# Patient Record
Sex: Female | Born: 1960
Health system: Southern US, Community
[De-identification: ages and names within clinical notes are randomized; demographics above are authoritative.]

## PROBLEM LIST (undated history)

## (undated) DIAGNOSIS — R51 Headache: Secondary | ICD-10-CM

## (undated) DIAGNOSIS — R519 Headache, unspecified: Secondary | ICD-10-CM

## (undated) DIAGNOSIS — E785 Hyperlipidemia, unspecified: Secondary | ICD-10-CM

## (undated) DIAGNOSIS — K219 Gastro-esophageal reflux disease without esophagitis: Secondary | ICD-10-CM

## (undated) DIAGNOSIS — IMO0001 Reserved for inherently not codable concepts without codable children: Secondary | ICD-10-CM

## (undated) DIAGNOSIS — J309 Allergic rhinitis, unspecified: Secondary | ICD-10-CM

## (undated) DIAGNOSIS — I10 Essential (primary) hypertension: Secondary | ICD-10-CM

## (undated) DIAGNOSIS — N943 Premenstrual tension syndrome: Secondary | ICD-10-CM

## (undated) DIAGNOSIS — K589 Irritable bowel syndrome without diarrhea: Secondary | ICD-10-CM

## (undated) DIAGNOSIS — E119 Type 2 diabetes mellitus without complications: Secondary | ICD-10-CM

## (undated) DIAGNOSIS — G8929 Other chronic pain: Secondary | ICD-10-CM

## (undated) DIAGNOSIS — F329 Major depressive disorder, single episode, unspecified: Secondary | ICD-10-CM

## (undated) DIAGNOSIS — F32A Depression, unspecified: Secondary | ICD-10-CM

## (undated) DIAGNOSIS — F419 Anxiety disorder, unspecified: Secondary | ICD-10-CM

## (undated) HISTORY — DX: Type 2 diabetes mellitus without complications: E11.9

## (undated) HISTORY — PX: LUMBAR DISC SURGERY: SHX700

## (undated) HISTORY — DX: Gastro-esophageal reflux disease without esophagitis: K21.9

## (undated) HISTORY — DX: Allergic rhinitis, unspecified: J30.9

## (undated) HISTORY — DX: Irritable bowel syndrome, unspecified: K58.9

## (undated) HISTORY — DX: Hyperlipidemia, unspecified: E78.5

## (undated) HISTORY — DX: Reserved for inherently not codable concepts without codable children: IMO0001

## (undated) HISTORY — DX: Depression, unspecified: F32.A

## (undated) HISTORY — DX: Other chronic pain: G89.29

## (undated) HISTORY — DX: Premenstrual tension syndrome: N94.3

## (undated) HISTORY — DX: Major depressive disorder, single episode, unspecified: F32.9

---

## 2003-07-16 ENCOUNTER — Ambulatory Visit (HOSPITAL_COMMUNITY): Admission: RE | Admit: 2003-07-16 | Discharge: 2003-07-16 | Payer: Self-pay | Admitting: Gastroenterology

## 2003-07-24 ENCOUNTER — Encounter: Admission: RE | Admit: 2003-07-24 | Discharge: 2003-07-24 | Payer: Self-pay | Admitting: Gastroenterology

## 2003-07-25 ENCOUNTER — Encounter: Admission: RE | Admit: 2003-07-25 | Discharge: 2003-07-25 | Payer: Self-pay | Admitting: Gastroenterology

## 2004-12-22 ENCOUNTER — Ambulatory Visit (HOSPITAL_COMMUNITY): Admission: RE | Admit: 2004-12-22 | Discharge: 2004-12-22 | Payer: Self-pay | Admitting: Family Medicine

## 2005-06-16 ENCOUNTER — Ambulatory Visit (HOSPITAL_COMMUNITY): Admission: RE | Admit: 2005-06-16 | Discharge: 2005-06-16 | Payer: Self-pay | Admitting: Family Medicine

## 2006-06-23 ENCOUNTER — Ambulatory Visit (HOSPITAL_COMMUNITY): Admission: RE | Admit: 2006-06-23 | Discharge: 2006-06-23 | Payer: Self-pay | Admitting: Obstetrics and Gynecology

## 2007-07-03 ENCOUNTER — Ambulatory Visit (HOSPITAL_COMMUNITY): Admission: RE | Admit: 2007-07-03 | Discharge: 2007-07-03 | Payer: Self-pay | Admitting: Obstetrics and Gynecology

## 2007-11-23 ENCOUNTER — Ambulatory Visit (HOSPITAL_COMMUNITY): Admission: RE | Admit: 2007-11-23 | Discharge: 2007-11-23 | Payer: Self-pay | Admitting: Family Medicine

## 2007-11-30 ENCOUNTER — Ambulatory Visit (HOSPITAL_COMMUNITY): Admission: RE | Admit: 2007-11-30 | Discharge: 2007-11-30 | Payer: Self-pay | Admitting: Family Medicine

## 2008-07-10 ENCOUNTER — Ambulatory Visit (HOSPITAL_COMMUNITY): Admission: RE | Admit: 2008-07-10 | Discharge: 2008-07-10 | Payer: Self-pay | Admitting: Obstetrics and Gynecology

## 2008-10-01 ENCOUNTER — Other Ambulatory Visit: Admission: RE | Admit: 2008-10-01 | Discharge: 2008-10-01 | Payer: Self-pay | Admitting: Obstetrics and Gynecology

## 2009-07-22 ENCOUNTER — Ambulatory Visit (HOSPITAL_COMMUNITY): Admission: RE | Admit: 2009-07-22 | Discharge: 2009-07-22 | Payer: Self-pay | Admitting: Obstetrics and Gynecology

## 2010-01-28 ENCOUNTER — Other Ambulatory Visit: Admission: RE | Admit: 2010-01-28 | Discharge: 2010-01-28 | Payer: Self-pay | Admitting: Obstetrics and Gynecology

## 2010-07-26 ENCOUNTER — Ambulatory Visit (HOSPITAL_COMMUNITY)
Admission: RE | Admit: 2010-07-26 | Discharge: 2010-07-26 | Payer: Self-pay | Source: Home / Self Care | Admitting: Obstetrics and Gynecology

## 2011-01-07 NOTE — Op Note (Signed)
NAME:  Laura Scott, Laura Scott                        ACCOUNT NO.:  [id number]   MEDICAL RECORD NO.:  [id number]                   PATIENT TYPE:  AMB   LOCATION:  ENDO                                 FACILITY:  [facility]   PHYSICIAN:  Anselmo Rod, M.D.               DATE OF BIRTH:  02-22-1961   DATE OF PROCEDURE:  07/16/2003  DATE OF DISCHARGE:                                 OPERATIVE REPORT   PROCEDURE PERFORMED:  Colonoscopy.   ENDOSCOPIST:  Anselmo Rod, M.D.   INSTRUMENT USED:  Olympus videocolonoscope.   INDICATIONS FOR PROCEDURE:  This is a 50 year old African-American female  with questionable mass on digital examination, rectal bleeding, and family  history of colon cancer in her paternal grandfather.  Rule out colonic  polyps, masses, etc.   PREPROCEDURE PREPARATION:  Informed consent was secured from the patient.  The patient fasted for eight hours prior to the procedure and prepped with a  bottle of magnesium citrate and one gallon of GoLYTELY the night prior to  the procedure.   PREPROCEDURE PHYSICAL:  VITAL SIGNS:  Stable.  NECK:  Supple.  CHEST:  Clear to auscultation.  HEART:  S1, S2 regular.  ABDOMEN:  Soft, with normal bowel sounds.   DESCRIPTION OF THE PROCEDURE:  The patient was placed in the left lateral  decubitus position and sedated with 100 mg of Demerol and 10 mg of Versed  intravenously.  Once the patient was adequately sedated and maintained on  low-flow oxygen and continuous cardiac monitor, the Olympus videocolonoscope  was advanced from the rectum to the cecum and terminal ileum without  difficulty.  The patient had some extrinsic compression of the rectum with  small internal hemorrhoids seen on retroflexion but no masses, polyps,  erosions, ulcerations, or diverticula were seen.  The appendiceal orifice  and the ileocecal valve were clearly visualized and photographed.  The  patient tolerated the procedure well without complications.   IMPRESSION:  1. Essentially normal colonoscopy up to the terminal ileum except for     extrinsic compression of the rectum.  2. Small nonbleeding internal hemorrhoids.   RECOMMENDATIONS:  1. Repeat colonoscopy is recommended in the next five years unless the     patient develops any abdominal symptoms.  2. High-fiber diet with liberal fluid intake has been advocated.  3. Outpatient follow-up after pelvic ultrasound to further evaluate the     extrinsic compression of the rectum.  4. Further recommendations will be made at that time.                                               Anselmo Rod, M.D.    JNM/MEDQ  D:  07/17/2003  T:  07/17/2003  Job:  [id number]   cc:   Christella Noa,  M.D.  [address]  [place], [place] [address]  Fax: [phone]

## 2011-06-13 ENCOUNTER — Other Ambulatory Visit (HOSPITAL_COMMUNITY)
Admission: RE | Admit: 2011-06-13 | Discharge: 2011-06-13 | Disposition: A | Discharge status: Home / Self Care | Payer: BC Managed Care – PPO | Source: Ambulatory Visit | Attending: Obstetrics and Gynecology | Admitting: Obstetrics and Gynecology

## 2011-06-13 ENCOUNTER — Other Ambulatory Visit: Payer: Self-pay | Admitting: Obstetrics and Gynecology

## 2011-06-13 DIAGNOSIS — Z01419 Encounter for gynecological examination (general) (routine) without abnormal findings: Secondary | ICD-10-CM | POA: Insufficient documentation

## 2011-07-13 ENCOUNTER — Other Ambulatory Visit: Payer: Self-pay | Admitting: Obstetrics and Gynecology

## 2011-07-13 DIAGNOSIS — Z1231 Encounter for screening mammogram for malignant neoplasm of breast: Secondary | ICD-10-CM

## 2011-07-20 ENCOUNTER — Telehealth: Payer: Self-pay

## 2011-07-20 NOTE — Telephone Encounter (Signed)
Gastroenterology Pre-Procedure Form  Request Date: 07/20/2011         Requesting Physician: Dr. Simone Curia     PATIENT INFORMATION:  Laura Scott is a 50 y.o., female (DOB=February 05, 1961).  PROCEDURE: Procedure(s) requested: colonoscopy Procedure Reason: screening for colon cancer  PATIENT REVIEW QUESTIONS: The patient reports the following:   1. Diabetes Melitis: no 2. Joint replacements in the past 12 months: no 3. Major health problems in the past 3 months: no 4. Has an artificial valve or MVP:no 5. Has been advised in past to take antibiotics in advance of a procedure like teeth cleaning: no}    MEDICATIONS & ALLERGIES:    Patient reports the following regarding taking any blood thinners:   Plavix? no Aspirin?no Coumadin?  no  Patient confirms/reports the following medications:  Current Outpatient Prescriptions  Medication Sig Dispense Refill  . cetirizine (ZYRTEC) 5 MG tablet Take 5 mg by mouth daily.        . DULoxetine (CYMBALTA) 30 MG capsule Take 30 mg by mouth daily.        . fluticasone (FLONASE) 50 MCG/ACT nasal spray Place 2 sprays into the nose daily. As directed       . ibuprofen (ADVIL,MOTRIN) 800 MG tablet Take 800 mg by mouth every 8 (eight) hours as needed. As needed for back pain       . Levonorgest-Eth Estrad 91-Day (CAMRESE PO) Take by mouth. For Hot Flashes       . Multiple Vitamin (MULTIVITAMIN) tablet Take 1 tablet by mouth daily.        . NON FORMULARY Vitamin B6   One tablet daily       . NON FORMULARY Vitamin B12   One tablet daily       . NON FORMULARY Vitamin D    One tablet daily       . zolpidem (AMBIEN) 10 MG tablet Take 10 mg by mouth at bedtime as needed.          Patient confirms/reports the following allergies:  Allergies  Allergen Reactions  . Codeine Hives    HIVES AND ITCHING  . Sulfa Antibiotics Hives    HIVES AND ITCING  . Other     SURGICAL TAPE    Patient is appropriate to schedule for requested procedure(s):  yes  AUTHORIZATION INFORMATION Primary Insurance:  ID #:   Group #:  Pre-Cert / Auth required: Pre-Cert / Auth #:  Secondary Insurance  ID #:   Group #: Pre-Cert / Auth required:  Pre-Cert / Auth #:  No orders of the defined types were placed in this encounter.    SCHEDULE INFORMATION: Procedure has been scheduled as follows:  Date: 08/29/2011      Time: 9:15 AM  Location: [facility] Short Stay  This Gastroenterology Pre-Precedure Form is being routed to the following provider(s) for review: R. Roetta Sessions, MD  Pt would like an appt with Dr. Jena Gauss before New Year. Has to review her schedule.   Rx and instructions mailed.

## 2011-07-21 NOTE — Telephone Encounter (Signed)
OK to proceed with colonoscopy.

## 2011-07-26 ENCOUNTER — Other Ambulatory Visit: Payer: Self-pay

## 2011-07-26 DIAGNOSIS — Z139 Encounter for screening, unspecified: Secondary | ICD-10-CM

## 2011-08-12 ENCOUNTER — Ambulatory Visit (HOSPITAL_COMMUNITY): Payer: BC Managed Care – PPO

## 2011-08-18 ENCOUNTER — Telehealth: Payer: Self-pay

## 2011-08-18 NOTE — Telephone Encounter (Signed)
Gastroenterology Pre-Procedure Form  Up date triage   Request Date: 08/18/2011      Requesting Physician: Dr. Simone Curia     PATIENT INFORMATION:  Laura Scott is a 50 y.o., female (DOB=February 16, 1961).  PROCEDURE: Procedure(s) requested: colonoscopy Procedure Reason: screening for colon cancer  PATIENT REVIEW QUESTIONS: The patient reports the following:   1. Diabetes Melitis: no 2. Joint replacements in the past 12 months: no 3. Major health problems in the past 3 months: no 4. Has an artificial valve or MVP:no 5. Has been advised in past to take antibiotics in advance of a procedure like teeth cleaning: no}    MEDICATIONS & ALLERGIES:    Patient reports the following regarding taking any blood thinners:   Plavix? no Aspirin?no Coumadin?  no  Patient confirms/reports the following medications:  Current Outpatient Prescriptions  Medication Sig Dispense Refill  . cetirizine (ZYRTEC) 5 MG tablet Take 5 mg by mouth daily.        . DULoxetine (CYMBALTA) 30 MG capsule Take 30 mg by mouth daily.        . fluticasone (FLONASE) 50 MCG/ACT nasal spray Place 2 sprays into the nose daily. As directed       . ibuprofen (ADVIL,MOTRIN) 800 MG tablet Take 800 mg by mouth every 8 (eight) hours as needed. As needed for back pain       . Levonorgest-Eth Estrad 91-Day (CAMRESE PO) Take by mouth. For Hot Flashes       . Multiple Vitamin (MULTIVITAMIN) tablet Take 1 tablet by mouth daily.        . NON FORMULARY Vitamin B6   One tablet daily       . NON FORMULARY Vitamin B12   One tablet daily       . NON FORMULARY Vitamin D    One tablet daily       . zolpidem (AMBIEN) 10 MG tablet Take 10 mg by mouth at bedtime as needed.          Patient confirms/reports the following allergies:  Allergies  Allergen Reactions  . Codeine Hives    HIVES AND ITCHING  . Sulfa Antibiotics Hives    HIVES AND ITCING  . Other     SURGICAL TAPE    Patient is appropriate to schedule for requested  procedure(s): yes  AUTHORIZATION INFORMATION Primary Insurance:   ID #:  Group #:  Pre-Cert / Auth required: } Pre-Cert / Auth #:   Secondary Insurance:   ID #:   Group #:  Pre-Cert / Auth required:  Pre-Cert / Auth #:   No orders of the defined types were placed in this encounter.    SCHEDULE INFORMATION: Procedure has been scheduled as follows:  Date: 08/29/2011      Time: 9:15 AM  Location: [facility] Short Stay  This Gastroenterology Pre-Precedure Form is being routed to the following provider(s) for review: R. Roetta Sessions, MD  PT HAS RX AND INSTRUCTIONS

## 2011-08-18 NOTE — Telephone Encounter (Signed)
OK as is.

## 2011-08-25 ENCOUNTER — Encounter (HOSPITAL_COMMUNITY): Payer: Self-pay | Admitting: Pharmacy Technician

## 2011-08-26 MED ORDER — SODIUM CHLORIDE 0.45 % IV SOLN: Freq: Once | INTRAVENOUS | Status: DC

## 2011-08-29 ENCOUNTER — Ambulatory Visit (HOSPITAL_COMMUNITY)
Admission: RE | Admit: 2011-08-29 | Discharge: 2011-08-29 | Disposition: A | Discharge status: Home / Self Care | Payer: BC Managed Care – PPO | Source: Ambulatory Visit | Attending: Internal Medicine | Admitting: Internal Medicine

## 2011-08-29 ENCOUNTER — Encounter (HOSPITAL_COMMUNITY): Payer: Self-pay | Admitting: *Deleted

## 2011-08-29 ENCOUNTER — Encounter (HOSPITAL_COMMUNITY)
Admission: RE | Disposition: A | Discharge status: Home / Self Care | Payer: Self-pay | Source: Ambulatory Visit | Attending: Internal Medicine

## 2011-08-29 DIAGNOSIS — Z1211 Encounter for screening for malignant neoplasm of colon: Secondary | ICD-10-CM

## 2011-08-29 DIAGNOSIS — Z800 Family history of malignant neoplasm of digestive organs: Secondary | ICD-10-CM | POA: Insufficient documentation

## 2011-08-29 DIAGNOSIS — Z139 Encounter for screening, unspecified: Secondary | ICD-10-CM

## 2011-08-29 DIAGNOSIS — Z8371 Family history of colonic polyps: Secondary | ICD-10-CM

## 2011-08-29 HISTORY — PX: COLONOSCOPY: SHX5424

## 2011-08-29 SURGERY — COLONOSCOPY
Anesthesia: Moderate Sedation

## 2011-08-29 MED ORDER — MEPERIDINE HCL 100 MG/ML IJ SOLN
INTRAMUSCULAR | Status: DC | PRN
  Administered 2011-08-29 (×2): 25 mg via INTRAVENOUS
  Administered 2011-08-29: 50 mg via INTRAVENOUS

## 2011-08-29 MED ORDER — MIDAZOLAM HCL 5 MG/5ML IJ SOLN
INTRAMUSCULAR | Status: AC
  Filled 2011-08-29: qty 10

## 2011-08-29 MED ORDER — MEPERIDINE HCL 100 MG/ML IJ SOLN
INTRAMUSCULAR | Status: AC
  Filled 2011-08-29: qty 2

## 2011-08-29 MED ORDER — STERILE WATER FOR IRRIGATION IR SOLN
Status: DC | PRN
  Administered 2011-08-29: 10:00:00

## 2011-08-29 MED ORDER — MIDAZOLAM HCL 5 MG/5ML IJ SOLN
INTRAMUSCULAR | Status: DC | PRN
  Administered 2011-08-29: 1 mg via INTRAVENOUS
  Administered 2011-08-29 (×2): 2 mg via INTRAVENOUS

## 2011-08-29 NOTE — H&P (Signed)
Primary Care Physician:  Harlow Asa, MD, MD Primary Gastroenterologist:  Dr.   Pre-Procedure History & Physical: HPI:  Laura Scott is a 51 y.o. female is here for a screening colonoscopy.   This is high-risk screening. Positive family history of polyps in sister and father. Last colonoscopy over 10 years ago for rectal bleeding [place], [place] without any reported significant findings per patient.  History reviewed. No pertinent past medical history.  History reviewed. No pertinent past surgical history.  Prior to Admission medications   Medication Sig Start Date End Date Taking? Authorizing Provider  Cholecalciferol (VITAMIN D3) 2000 UNITS capsule Take 2,000 Units by mouth daily.     Yes Historical Provider, MD  DULoxetine (CYMBALTA) 30 MG capsule Take 30 mg by mouth daily.     Yes Historical Provider, MD  ibuprofen (ADVIL,MOTRIN) 800 MG tablet Take 800 mg by mouth every 8 (eight) hours as needed. As needed for back pain    Yes Historical Provider, MD  Levonorgest-Eth Estrad 91-Day (CAMRESE PO) Take 1 tablet by mouth daily.    Yes Historical Provider, MD  Multiple Vitamin (MULTIVITAMIN) tablet Take 1 tablet by mouth daily.     Yes Historical Provider, MD  pyridOXINE (VITAMIN B-6) 100 MG tablet Take 100 mg by mouth daily.     Yes Historical Provider, MD  vitamin B-12 (CYANOCOBALAMIN) 1000 MCG tablet Take 1,000 mcg by mouth daily.     Yes Historical Provider, MD  zolpidem (AMBIEN) 10 MG tablet Take 10 mg by mouth at bedtime as needed.     Yes Historical Provider, MD  cetirizine (ZYRTEC) 5 MG tablet Take 5 mg by mouth daily.      Historical Provider, MD    Allergies as of 07/26/2011 - never reviewed  Allergen Reaction Noted  . Codeine Hives 07/20/2011  . Sulfa antibiotics Hives 07/20/2011  . Other  07/20/2011    History reviewed. No pertinent family history.  History   Social History  . Marital Status: Divorced    Spouse Name: N/A    Number of Children: N/A  . Years of  Education: N/A   Occupational History  . Not on file.   Social History Main Topics  . Smoking status: Never Smoker   . Smokeless tobacco: Not on file  . Alcohol Use: Yes  . Drug Use: No  . Sexually Active: Yes    Birth Control/ Protection: Pill   Other Topics Concern  . Not on file   Social History Narrative  . No narrative on file    Review of Systems: See HPI, otherwise negative ROS  Physical Exam: BP 131/59  Pulse 66  Temp(Src) 98.3 F (36.8 C) (Oral)  Resp 27  Ht 5\' 6"  (1.676 m)  Wt 175 lb (79.379 kg)  BMI 28.25 kg/m2  SpO2 99%  LMP 06/23/2011 General:   Alert,  Well-developed, well-nourished, pleasant and cooperative in NAD Head:  Normocephalic and atraumatic. Eyes:  Sclera clear, no icterus.   Conjunctiva pink. Ears:  Normal auditory acuity. Nose:  No deformity, discharge,  or lesions. Mouth:  No deformity or lesions, dentition normal. Neck:  Supple; no masses or thyromegaly. Lungs:  Clear throughout to auscultation.   No wheezes, crackles, or rhonchi. No acute distress. Heart:  Regular rate and rhythm; no murmurs, clicks, rubs,  or gallops. Abdomen:  Nondistended positive bowel sounds, soft nontender without appreciable organomegaly or mass Msk:  Symmetrical without gross deformities. Normal posture. Pulses:  Normal pulses noted. Extremities:  Without clubbing  or edema. Neurologic:  Alert and  oriented x4;  grossly normal neurologically. Skin:  Intact without significant lesions or rashes. Cervical Nodes:  No significant cervical adenopathy. Psych:  Alert and cooperative. Normal mood and affect.  Impression/Plan: Laura Scott is now here to undergo a screening colonoscopy.  This is high-risk screening given her sister and father are in a surveillance program for colonic polyps.  Risks, benefits, limitations, imponderables and alternatives regarding colonoscopy have been reviewed with the patient. Questions have been answered. All parties agreeable.

## 2011-08-29 NOTE — Op Note (Signed)
[facility] [address] [place], [place]  [address]  COLONOSCOPY PROCEDURE REPORT  PATIENT:  Laura Scott, Laura Scott  MR#:  [id number] BIRTHDATE:  1961/08/06, 50 yrs. old  GENDER:  female ENDOSCOPIST:  R. Roetta Sessions, MD FACP [facility] REF. BY:          Dr. Minette Headland PROCEDURE DATE:  08/29/2011 PROCEDURE:  high-risk screening ileocolonoscopy  INDICATIONS:  positive family history of polyps in 2 first-degree relatives  INFORMED CONSENT:  The risks, benefits, alternatives and imponderables including but not limited to bleeding, perforation as well as the possibility of a missed lesion have been reviewed. The potential for biopsy, lesion removal, etc. have also been discussed.  Questions have been answered.  All parties agreeable. Please see the history and physical in the medical record for more information.  MEDICATIONS:  Versed 5 mg IV and Demerol 100 mg IV in divided doses.  DESCRIPTION OF PROCEDURE:  After a digital rectal exam was performed, the EC-[id number] ([id number]) colonoscope was advanced from the anus through the rectum and colon to the area of the cecum, ileocecal valve and appendiceal orifice.  The cecum was deeply intubated.  These structures were well-seen and photographed for the record.  From the level of the cecum and ileocecal valve, the scope was slowly and cautiously withdrawn.  The mucosal surfaces were carefully surveyed utilizing scope tip deflection to facilitate fold flattening as needed.  The scope was pulled down into the rectum where a thorough examination including retroflexion was performed. <<PROCEDUREIMAGES>>  FINDINGS:     adequate preparation.  THERAPEUTIC / DIAGNOSTIC MANEUVERS PERFORMED:     none  COMPLICATIONS:  none  CECAL WITHDRAWAL TIME:   12 minutes  IMPRESSION:    normal rectum and colon  RECOMMENDATIONS:    Repeat high-risk screening colonoscopy in 5 years  ______________________________ R. Roetta Sessions, MD Caleen Essex  CC:   Simone Curia, Md  n. eSIGNED:   R. Roetta Sessions at 08/29/2011 10:15 AM  [place]arm [place], [phone]

## 2011-09-01 ENCOUNTER — Encounter (HOSPITAL_COMMUNITY): Payer: Self-pay | Admitting: Internal Medicine

## 2012-05-01 ENCOUNTER — Other Ambulatory Visit: Payer: Self-pay | Admitting: Family Medicine

## 2012-05-01 DIAGNOSIS — Z1231 Encounter for screening mammogram for malignant neoplasm of breast: Secondary | ICD-10-CM

## 2012-05-10 ENCOUNTER — Ambulatory Visit (HOSPITAL_COMMUNITY)
Admission: RE | Admit: 2012-05-10 | Discharge: 2012-05-10 | Disposition: A | Discharge status: Home / Self Care | Payer: BC Managed Care – PPO | Source: Ambulatory Visit | Attending: Family Medicine | Admitting: Family Medicine

## 2012-05-10 DIAGNOSIS — Z1231 Encounter for screening mammogram for malignant neoplasm of breast: Secondary | ICD-10-CM | POA: Insufficient documentation

## 2012-05-11 ENCOUNTER — Ambulatory Visit (HOSPITAL_COMMUNITY)
Admission: RE | Admit: 2012-05-11 | Discharge: 2012-05-11 | Disposition: A | Discharge status: Home / Self Care | Payer: BC Managed Care – PPO | Source: Ambulatory Visit | Attending: Family Medicine | Admitting: Family Medicine

## 2012-05-11 ENCOUNTER — Other Ambulatory Visit: Payer: Self-pay | Admitting: Family Medicine

## 2012-05-11 DIAGNOSIS — M545 Low back pain, unspecified: Secondary | ICD-10-CM

## 2013-01-09 ENCOUNTER — Other Ambulatory Visit: Payer: Self-pay | Admitting: Family Medicine

## 2013-01-09 NOTE — Telephone Encounter (Signed)
1 refill needs office visit 

## 2013-01-10 NOTE — Telephone Encounter (Signed)
RX called into [facility], for xanax 0.5 mg

## 2013-02-19 ENCOUNTER — Encounter: Payer: Self-pay | Admitting: *Deleted

## 2013-02-27 ENCOUNTER — Other Ambulatory Visit: Payer: Self-pay | Admitting: Family Medicine

## 2013-03-07 ENCOUNTER — Other Ambulatory Visit: Payer: Self-pay

## 2013-03-07 MED ORDER — ZOLPIDEM TARTRATE 10 MG PO TABS: 10.0000 mg | ORAL_TABLET | Freq: Every evening | ORAL | Status: DC | PRN

## 2013-04-11 ENCOUNTER — Ambulatory Visit (INDEPENDENT_AMBULATORY_CARE_PROVIDER_SITE_OTHER): Payer: BC Managed Care – PPO | Admitting: Family Medicine

## 2013-04-11 ENCOUNTER — Encounter: Payer: Self-pay | Admitting: Family Medicine

## 2013-04-11 VITALS — BP 166/90 | Ht 66.0 in | Wt 171.0 lb

## 2013-04-11 DIAGNOSIS — M549 Dorsalgia, unspecified: Secondary | ICD-10-CM

## 2013-04-11 DIAGNOSIS — R030 Elevated blood-pressure reading, without diagnosis of hypertension: Secondary | ICD-10-CM

## 2013-04-11 DIAGNOSIS — IMO0001 Reserved for inherently not codable concepts without codable children: Secondary | ICD-10-CM

## 2013-04-11 DIAGNOSIS — G8929 Other chronic pain: Secondary | ICD-10-CM | POA: Insufficient documentation

## 2013-04-11 DIAGNOSIS — G4700 Insomnia, unspecified: Secondary | ICD-10-CM | POA: Insufficient documentation

## 2013-04-11 MED ORDER — ZOLPIDEM TARTRATE 10 MG PO TABS: 10.0000 mg | ORAL_TABLET | Freq: Every evening | ORAL | Status: DC | PRN

## 2013-04-11 MED ORDER — DICLOFENAC SODIUM 75 MG PO TBEC: 75.0000 mg | DELAYED_RELEASE_TABLET | Freq: Two times a day (BID) | ORAL | Status: DC

## 2013-04-11 MED ORDER — ALPRAZOLAM 0.5 MG PO TABS: ORAL_TABLET | ORAL | Status: DC

## 2013-04-11 NOTE — Progress Notes (Signed)
  Subjective:    Patient ID: Laura Scott, female    DOB: [birth year], 52 y.o.   MRN: [id number]  Back Pain This is a new problem. The current episode started 1 to 4 weeks ago. The pain radiates to the right thigh. The pain is at a severity of 9/10. The symptoms are aggravated by standing. Stiffness is present in the morning. She has tried heat (ibuprofen) for the symptoms.    Started two wks ago, no sudden injury,, pain since then, Into the buttock  ambien each night, helping. Without sleeping medicine patient has a very difficult time. Needs refill on xanax.a lot of stress  History of elevated blood pressure. Strong family history of hypertension. Numbers often up within improved on repeat. Not exercising. Admits to excess salt intake at times.   Review of Systems  Musculoskeletal: Positive for back pain.   no chest pain no abdominal pain no change in bowel habits     Objective:   Physical Exam Alert no acute distress. HEENT normal. Blood pressure 156/90 on repeat lungs clear. Heart regular rate and rhythm. Lumbar region left sided tenderness. Minimal sciatic notch tenderness. Negative straight leg raise on left.       Assessment & Plan:  Impression 1 insomnia discussed #2 elevated blood pressure discuss not high enough for medicines yet #3 chronic back pain likely muscles and ligaments discussed #4 chronic anxiety. plan appropriate medicine called in for low back. Exercise strongly encourage. Check every 6 months. Xanax refilled. Full tear and when necessary for back pain.

## 2013-04-11 NOTE — Patient Instructions (Addendum)
Please start walking regularly and add stretching low back exercises  Don't take ibuprofen when on prescription antiinflam med

## 2013-05-02 ENCOUNTER — Other Ambulatory Visit: Payer: Self-pay | Admitting: Family Medicine

## 2013-05-02 NOTE — Telephone Encounter (Signed)
2 refills. If ongoing need for refills will need followup visit.

## 2013-05-22 ENCOUNTER — Other Ambulatory Visit: Payer: Self-pay | Admitting: *Deleted

## 2013-05-22 MED ORDER — LEVONORGEST-ETH ESTRAD 91-DAY 0.15-0.03 &0.01 MG PO TABS: 1.0000 | ORAL_TABLET | Freq: Every day | ORAL | Status: DC

## 2013-05-27 ENCOUNTER — Other Ambulatory Visit: Payer: Self-pay | Admitting: Family Medicine

## 2013-05-28 NOTE — Telephone Encounter (Signed)
Last office visit 04-11-13

## 2013-07-05 ENCOUNTER — Other Ambulatory Visit: Payer: Self-pay | Admitting: Family Medicine

## 2013-07-31 ENCOUNTER — Other Ambulatory Visit: Payer: Self-pay | Admitting: Family Medicine

## 2013-07-31 DIAGNOSIS — Z1231 Encounter for screening mammogram for malignant neoplasm of breast: Secondary | ICD-10-CM

## 2013-08-12 ENCOUNTER — Other Ambulatory Visit: Payer: Self-pay | Admitting: Family Medicine

## 2013-08-13 NOTE — Telephone Encounter (Signed)
6 refills on voltaren , 2 refills on xanax

## 2013-08-21 ENCOUNTER — Ambulatory Visit (HOSPITAL_COMMUNITY)
Admission: RE | Admit: 2013-08-21 | Discharge: 2013-08-21 | Disposition: A | Discharge status: Home / Self Care | Payer: BC Managed Care – PPO | Source: Ambulatory Visit | Attending: Family Medicine | Admitting: Family Medicine

## 2013-08-21 DIAGNOSIS — Z1231 Encounter for screening mammogram for malignant neoplasm of breast: Secondary | ICD-10-CM

## 2013-09-25 ENCOUNTER — Ambulatory Visit (INDEPENDENT_AMBULATORY_CARE_PROVIDER_SITE_OTHER): Payer: BC Managed Care – PPO | Admitting: Family Medicine

## 2013-09-25 ENCOUNTER — Encounter: Payer: Self-pay | Admitting: Family Medicine

## 2013-09-25 ENCOUNTER — Ambulatory Visit: Payer: BC Managed Care – PPO | Admitting: Family Medicine

## 2013-09-25 VITALS — BP 150/80 | Ht 66.0 in | Wt 170.5 lb

## 2013-09-25 DIAGNOSIS — M25551 Pain in right hip: Secondary | ICD-10-CM

## 2013-09-25 DIAGNOSIS — M545 Low back pain, unspecified: Secondary | ICD-10-CM

## 2013-09-25 DIAGNOSIS — I10 Essential (primary) hypertension: Secondary | ICD-10-CM

## 2013-09-25 DIAGNOSIS — M25559 Pain in unspecified hip: Secondary | ICD-10-CM

## 2013-09-25 DIAGNOSIS — Z79899 Other long term (current) drug therapy: Secondary | ICD-10-CM

## 2013-09-25 MED ORDER — ENALAPRIL MALEATE 5 MG PO TABS: 5.0000 mg | ORAL_TABLET | Freq: Every day | ORAL | Status: DC

## 2013-09-25 MED ORDER — DICLOFENAC SODIUM 75 MG PO TBEC: DELAYED_RELEASE_TABLET | ORAL | Status: DC

## 2013-09-25 MED ORDER — ZOLPIDEM TARTRATE 10 MG PO TABS: 10.0000 mg | ORAL_TABLET | Freq: Every evening | ORAL | Status: DC | PRN

## 2013-09-25 MED ORDER — ALPRAZOLAM 0.5 MG PO TABS: ORAL_TABLET | ORAL | Status: DC

## 2013-09-25 MED ORDER — CHLORZOXAZONE 500 MG PO TABS: 500.0000 mg | ORAL_TABLET | Freq: Three times a day (TID) | ORAL | Status: DC | PRN

## 2013-09-25 NOTE — Progress Notes (Signed)
   Subjective:    Patient ID: Laura Scott, female    DOB: 1961/06/17, 53 y.o.   MRN: [id number]  HPI Patient is here today for a follow up visit on her chronic back pain and elevated blood pressure. States her back pain is not getting any better. States her back spasms are worsening also. No other concerns at this time.   Doing the stretching for the back on days worked  Back pops at times  chiro remotely for back pain  Lot of lifting with the job, strains low back with tightness and pressure  Stiff and uncomfortable  Cut back on salt, Not reg exercise, a lot of stress going on,,  Ongoing trouble sleeping, takes the [place] takes regularly . States it definitely continues to needed.  Review of Systems No chest pain no change about habits no blood in stool no rash no weight loss weight gain ROS otherwise negative    Objective:   Physical Exam Alert blood pressure repeat 170/92. Similar both arms. HEENT normal. Lungs clear. Heart regular in rhythm. Low back tender to percussion left lateral scapular region tender to deep palpation. Ankles without edema.       Assessment & Plan:  Impression 1 hypertension discussed at length. New-onset. Strong family history. #2 low back pain. Likely musculoskeletal. Known arthritis in the low back. #3 right hip pain likely arthritis. #4 areas Malleolar a lump in tightness likely muscle component discussed. #5 insomnia ongoing significant plan appropriate x-rays. Appropriate blood work. Initiate enalapril 5 daily. Change Voltaren to every single day. Add chlorzoxazone when necessary. Recheck in one month. Diet exercise discussed. WSL

## 2013-10-04 ENCOUNTER — Encounter: Payer: Self-pay | Admitting: Obstetrics and Gynecology

## 2013-10-04 ENCOUNTER — Other Ambulatory Visit (HOSPITAL_COMMUNITY)
Admission: RE | Admit: 2013-10-04 | Discharge: 2013-10-04 | Disposition: A | Discharge status: Home / Self Care | Payer: BC Managed Care – PPO | Source: Ambulatory Visit | Attending: Obstetrics and Gynecology | Admitting: Obstetrics and Gynecology

## 2013-10-04 ENCOUNTER — Ambulatory Visit (INDEPENDENT_AMBULATORY_CARE_PROVIDER_SITE_OTHER): Payer: BC Managed Care – PPO | Admitting: Obstetrics and Gynecology

## 2013-10-04 VITALS — BP 156/80 | Ht 65.5 in | Wt 169.2 lb

## 2013-10-04 DIAGNOSIS — Z1212 Encounter for screening for malignant neoplasm of rectum: Secondary | ICD-10-CM

## 2013-10-04 DIAGNOSIS — Z01419 Encounter for gynecological examination (general) (routine) without abnormal findings: Secondary | ICD-10-CM

## 2013-10-04 DIAGNOSIS — Z1151 Encounter for screening for human papillomavirus (HPV): Secondary | ICD-10-CM | POA: Insufficient documentation

## 2013-10-04 LAB — HEMOCCULT GUIAC POC 1CARD (OFFICE): Fecal Occult Blood, POC: NEGATIVE

## 2013-10-04 MED ORDER — CONJ ESTROGENS-BAZEDOXIFENE 0.45-20 MG PO TABS: 1.0000 | ORAL_TABLET | Freq: Every day | ORAL | Status: DC

## 2013-10-04 NOTE — Patient Instructions (Signed)
Hormone Therapy--check out Duavee At menopause, your body begins making less estrogen and progesterone hormones. This causes the body to stop having menstrual periods. This is because estrogen and progesterone hormones control your periods and menstrual cycle. A lack of estrogen may cause symptoms such as:  Hot flushes (or hot flashes).  Vaginal dryness.  Dry skin.  Loss of sex drive.  Risk of bone loss (osteoporosis). When this happens, you may choose to take hormone therapy to get back the estrogen lost during menopause. When the hormone estrogen is given alone, it is usually referred to as ET (Estrogen Therapy). When the hormone progestin is combined with estrogen, it is generally called HT (Hormone Therapy). This was formerly known as hormone replacement therapy (HRT). Your caregiver can help you make a decision on what will be best for you. The decision to use HT seems to change often as new studies are done. Many studies do not agree on the benefits of hormone replacement therapy. LIKELY BENEFITS OF HT INCLUDE PROTECTION FROM:  Hot Flushes (also called hot flashes) - A hot flush is a sudden feeling of heat that spreads over the face and body. The skin may redden like a blush. It is connected with sweats and sleep disturbance. Women going through menopause may have hot flushes a few times a month or several times per day depending on the woman.  Osteoporosis (bone loss)- Estrogen helps guard against bone loss. After menopause, a woman's bones slowly lose calcium and become weak and brittle. As a result, bones are more likely to break. The hip, wrist, and spine are affected most often. Hormone therapy can help slow bone loss after menopause. Weight bearing exercise and taking calcium with vitamin D also can help prevent bone loss. There are also medications that your caregiver can prescribe that can help prevent osteoporosis.  Vaginal Dryness - Loss of estrogen causes changes in the vagina.  Its lining may become thin and dry. These changes can cause pain and bleeding during sexual intercourse. Dryness can also lead to infections. This can cause burning and itching. (Vaginal estrogen treatment can help relieve pain, itching, and dryness.)  Urinary Tract Infections are more common after menopause because of lack of estrogen. Some women also develop urinary incontinence because of low estrogen levels in the vagina and bladder.  Possible other benefits of estrogen include a positive effect on mood and short-term memory in women. RISKS AND COMPLICATIONS  Using estrogen alone without progesterone causes the lining of the uterus to grow. This increases the risk of lining of the uterus (endometrial) cancer. Your caregiver should give another hormone called progestin if you have a uterus.  Women who take combined (estrogen and progestin) HT appear to have an increased risk of breast cancer. The risk appears to be small, but increases throughout the time that HT is taken.  Combined therapy also makes the breast tissue slightly denser which makes it harder to read mammograms (breast X-rays).  Combined, estrogen and progesterone therapy can be taken together every day, in which case there may be spotting of blood. HT therapy can be taken cyclically in which case you will have menstrual periods. Cyclically means HT is taken for a set amount of days, then not taken, then this process is repeated.  HT may increase the risk of stroke, heart attack, breast cancer and forming blood clots in your leg.  Transdermal estrogen (estrogen that is absorbed through the skin with a patch or a cream) may have more positive  results with:  Cholesterol.  Blood pressure.  Blood clots. Having the following conditions may indicate you should not have HT:  Endometrial cancer.  Liver disease.  Breast cancer.  Heart disease.  History of blood clots.  Stroke. TREATMENT   If you choose to take HT and  have a uterus, usually estrogen and progestin are prescribed.  Your caregiver will help you decide the best way to take the medications.  Possible ways to take estrogen include:  Pills.  Patches.  Gels.  Sprays.  Vaginal estrogen cream, rings and tablets.  It is best to take the lowest dose possible that will help your symptoms and take them for the shortest period of time that you can.  Hormone therapy can help relieve some of the problems (symptoms) that affect women at menopause. Before making a decision about HT, talk to your caregiver about what is best for you. Be well informed and comfortable with your decisions. HOME CARE INSTRUCTIONS   Follow your caregivers advice when taking the medications.  A Pap test is done to screen for cervical cancer.  The first Pap test should be done at age 25.  Between ages 35 and 41, Pap tests are repeated every 2 years.  Beginning at age 25, you are advised to have a Pap test every 3 years as long as your past 3 Pap tests have been normal.  Some women have medical problems that increase the chance of getting cervical cancer. Talk to your caregiver about these problems. It is especially important to talk to your caregiver if a new problem develops soon after your last Pap test. In these cases, your caregiver may recommend more frequent screening and Pap tests.  The above recommendations are the same for women who have or have not gotten the vaccine for HPV (Human Papillomavirus).  If you had a hysterectomy for a problem that was not a cancer or a condition that could lead to cancer, then you no longer need Pap tests. However, even if you no longer need a Pap test, a regular exam is a good idea to make sure no other problems are starting.   If you are between ages 21 and 2, and you have had normal Pap tests going back 10 years, you no longer need Pap tests. However, even if you no longer need a Pap test, a regular exam is a good idea to  make sure no other problems are starting.   If you have had past treatment for cervical cancer or a condition that could lead to cancer, you need Pap tests and screening for cancer for at least 20 years after your treatment.  If Pap tests have been discontinued, risk factors (such as a new sexual partner) need to be re-assessed to determine if screening should be resumed.  Some women may need screenings more often if they are at high risk for cervical cancer.  Get mammograms done as per the advice of your caregiver. SEEK IMMEDIATE MEDICAL CARE IF:  You develop abnormal vaginal bleeding.  You have pain or swelling in your legs, shortness of breath, or chest pain.  You develop dizziness or headaches.  You have lumps or changes in your breasts or armpits.  You have slurred speech.  You develop weakness or numbness of your arms or legs.  You have pain, burning, or bleeding when urinating.  You develop abdominal pain. Document Released: 05/07/2003 Document Revised: 10/31/2011 Document Reviewed: 08/25/2010 [facility] Patient Information 2014 [place], [place].

## 2013-10-04 NOTE — Progress Notes (Signed)
This chart was scribed by Jenne Campus, Medical Scribe, for Dr. [name] on 10/04/13 at 11:49 AM. This chart was reviewed by Dr. [name] and is accurate.   Assessment:  Annual Gyn Exam   Plan:  1. pap smear done, next pap due 3 years 2. return annually or prn for managing HT 3    Annual mammogram advised  4    Encouraged pt to have hemoccult checked thru PCP 5    Hormone replacement discussed--Duavee Subjective:  Laura Scott is a 53 y.o. female No obstetric history on file. who presents for annual exam. Patient's last menstrual period was 09/03/2013. The patient has complaints today of hot flashes X3 monthly. Also reports chronic back pain dx as arthritis. States she is scheduled for x-rays soon. Last PAP was 3 years ago.  +vaginal irritation for 2 days last week. Resolved now. Believes it was the lemon water that she was drinking for her allergies.   The following portions of the patient's history were reviewed and updated as appropriate: allergies, current medications, past family history, past medical history, past social history, past surgical history and problem list.  Review of Systems Constitutional: positive for hot flashes Gastrointestinal: negative Genitourinary: negative +back pain, f/u with specialist  Objective:  BP 156/80  Ht 5' 5.5" (1.664 m)  Wt 169 lb 3.2 oz (76.749 kg)  BMI 27.72 kg/m2  LMP 09/03/2013   BMI: Body mass index is 27.72 kg/(m^2).  Chaperone present for exam. Exam performed with pt's permission without discomfort or complications. General Appearance: Alert, appropriate appearance for age. No acute distress HEENT: Grossly normal Neck / Thyroid:  Cardiovascular: RRR; normal S1, S2, no murmur Lungs: CTA bilaterally Back: No CVAT Breast Exam: No dimpling, nipple retraction or discharge. No masses or nodes., Normal to inspection, Normal breast tissue bilaterally and No masses or nodes.No dimpling, nipple retraction or  discharge. Gastrointestinal: Soft, non-tender, no masses or organomegaly Pelvic Exam: Vaginal: normal without tenderness, induration or masses and atrophic mucosa Cervix: normal appearance Adnexa: normal bimanual exam Uterus: normal single, nontender Rectal: good sphincter tone, no masses and guaiac negative Good anterior support Lymphatic Exam: Non-palpable nodes in neck, clavicular, axillary, or inguinal regions  Skin: no rash or abnormalities Neurologic: Normal gait and speech, no tremor  Psychiatric: Alert and oriented, appropriate affect.  Urinalysis:Not done  [name]. MD Pgr [phone] 11:59 AM

## 2013-10-14 LAB — HEPATIC FUNCTION PANEL
ALT: 33 U/L (ref 0–35)
AST: 22 U/L (ref 0–37)
Albumin: 4.2 g/dL (ref 3.5–5.2)
Alkaline Phosphatase: 54 U/L (ref 39–117)
Bilirubin, Direct: 0.1 mg/dL (ref 0.0–0.3)
Indirect Bilirubin: 0.5 mg/dL (ref 0.2–1.2)
Total Bilirubin: 0.6 mg/dL (ref 0.2–1.2)
Total Protein: 7 g/dL (ref 6.0–8.3)

## 2013-10-14 LAB — BASIC METABOLIC PANEL
BUN: 13 mg/dL (ref 6–23)
CO2: 29 mEq/L (ref 19–32)
Calcium: 9.4 mg/dL (ref 8.4–10.5)
Chloride: 101 mEq/L (ref 96–112)
Creat: 0.74 mg/dL (ref 0.50–1.10)
Glucose, Bld: 105 mg/dL — ABNORMAL HIGH (ref 70–99)
Potassium: 4.1 mEq/L (ref 3.5–5.3)
Sodium: 140 mEq/L (ref 135–145)

## 2013-10-14 LAB — LIPID PANEL
Cholesterol: 179 mg/dL (ref 0–200)
HDL: 51 mg/dL (ref >39)
LDL Cholesterol: 100 mg/dL — ABNORMAL HIGH (ref 0–99)
Total CHOL/HDL Ratio: 3.5 Ratio
Triglycerides: 142 mg/dL (ref <150)
VLDL: 28 mg/dL (ref 0–40)

## 2013-10-16 ENCOUNTER — Ambulatory Visit (HOSPITAL_COMMUNITY)
Admission: RE | Admit: 2013-10-16 | Discharge: 2013-10-16 | Disposition: A | Discharge status: Home / Self Care | Payer: BC Managed Care – PPO | Source: Ambulatory Visit | Attending: Family Medicine | Admitting: Family Medicine

## 2013-10-16 DIAGNOSIS — M549 Dorsalgia, unspecified: Secondary | ICD-10-CM | POA: Insufficient documentation

## 2013-10-16 DIAGNOSIS — M25559 Pain in unspecified hip: Secondary | ICD-10-CM | POA: Insufficient documentation

## 2013-10-21 ENCOUNTER — Other Ambulatory Visit: Payer: Self-pay | Admitting: Family Medicine

## 2013-10-21 NOTE — Telephone Encounter (Signed)
Last seen 09/25/13 

## 2013-10-22 ENCOUNTER — Other Ambulatory Visit: Payer: Self-pay | Admitting: Family Medicine

## 2013-10-22 NOTE — Telephone Encounter (Signed)
Ok plus 5 ref 

## 2013-10-23 NOTE — Progress Notes (Signed)
Patient notified and verbalized understanding of the test results. No further questions. 

## 2013-10-29 ENCOUNTER — Encounter: Payer: Self-pay | Admitting: Family Medicine

## 2013-10-29 ENCOUNTER — Ambulatory Visit (INDEPENDENT_AMBULATORY_CARE_PROVIDER_SITE_OTHER): Payer: BC Managed Care – PPO | Admitting: Family Medicine

## 2013-10-29 VITALS — BP 148/82 | Ht 66.0 in | Wt 167.6 lb

## 2013-10-29 DIAGNOSIS — M129 Arthropathy, unspecified: Secondary | ICD-10-CM

## 2013-10-29 DIAGNOSIS — IMO0001 Reserved for inherently not codable concepts without codable children: Secondary | ICD-10-CM

## 2013-10-29 DIAGNOSIS — R7301 Impaired fasting glucose: Secondary | ICD-10-CM | POA: Insufficient documentation

## 2013-10-29 DIAGNOSIS — R030 Elevated blood-pressure reading, without diagnosis of hypertension: Secondary | ICD-10-CM

## 2013-10-29 DIAGNOSIS — M549 Dorsalgia, unspecified: Secondary | ICD-10-CM

## 2013-10-29 DIAGNOSIS — G4700 Insomnia, unspecified: Secondary | ICD-10-CM

## 2013-10-29 DIAGNOSIS — M1990 Unspecified osteoarthritis, unspecified site: Secondary | ICD-10-CM

## 2013-10-29 DIAGNOSIS — G8929 Other chronic pain: Secondary | ICD-10-CM

## 2013-10-29 MED ORDER — VERAPAMIL HCL ER 180 MG PO TBCR: 180.0000 mg | EXTENDED_RELEASE_TABLET | Freq: Every day | ORAL | Status: DC

## 2013-10-29 NOTE — Patient Instructions (Signed)
Raynaud's Syndrome Raynaud's Syndrome is a disorder of the blood vessels in your hands and feet. It occurs when small arteries of the arms/hands or legs/feet become sensitive to cold or emotional upset. This causes the arteries to constrict, or narrow, and reduces blood flow to the area. The color in the fingers or toes changes from white to bluish to red and this is not usually painful. There may be numbness and tingling. Sores on the skin (ulcers) can form. Symptoms are usually relieved by warming. HOME CARE INSTRUCTIONS   Avoid exposure to cold. Keep your whole body warm and dry. Dress in layers. Wear mittens or gloves when handling ice or frozen food and when outdoors. Use holders for glasses or cans containing cold drinks. If possible, stay indoors during cold weather.  Limit your use of caffeine. Switch to decaffeinated coffee, tea, and soda pop. Avoid chocolate.  Avoid smoking or being around cigarette smoke. Smoke will make symptoms worse.  Wear loose fitting socks and comfortable, roomy shoes.  Avoid vibrating tools and machinery.  If possible, avoid stressful and emotional situations. Exercise, meditation and yoga may help you cope with stress. Biofeedback may be useful.  Ask your caregiver about medicine (calcium channel blockers) that may control Raynaud's phenomena. SEEK MEDICAL CARE IF:   Your discomfort becomes worse, despite conservative treatment.  You develop sores on your fingers and toes that do not heal. Document Released: 08/05/2000 Document Revised: 10/31/2011 Document Reviewed: 08/12/2008 [employer] Patient Information 2014 [employer].  

## 2013-10-29 NOTE — Progress Notes (Signed)
   Subjective:    Patient ID: Laura Scott, female    DOB: 01-24-1961, 53 y.o.   MRN: [id number]  HPI Patient arrives to discuss recent blood work results.   Of note patient's renal function and liver function were completely normal.  Sugar was up slightly at 105.  Patient claims complete compliance with blood pressure medicine. No obvious side effects. Trying to watch salt in diet. Blood pressure decent when checked elsewhere.  Ongoing challenges with trouble sleeping. Compliant with medication. Does help. Results for orders placed in visit on 10/04/13  HEMOCCULT GUIAC POC 1CARD (OFFICE)      Result Value Ref Range   Fecal Occult Blood, POC Negative     Card #1 Date       Card #2 Fecal Occult Blod, POC       Card #2 Date       Card #3 Fecal Occult Blood, POC       Card #3 Date       Hx of elevated fruit intake  Drinks gengferale on occasion  exer ises so so  Saw ent, advised had allergies  Pt states cannot walk to hard, Considering [facility] [employer]  Sugar elevated  Review of Systems No chest pain no back pain no abdominal pain no change about habits no blood in stool no swollen feet ROS otherwise negative    Objective:   Physical Exam Alert no major distress. HEENT sinus congestion. Lungs clear. Heart regular in rhythm. Ankles without edema. Vitals reviewed.       Assessment & Plan:  Pressure 1 hypertension good control discussed #2 insomnia ongoing. Cognitive a #3 #3 depression fairly stable per patient. #4 impaired fasting glucose discussed plan maintain same meds. Diet exercise discussed. Medications refilled. Followup as scheduled. WSL

## 2013-11-14 ENCOUNTER — Other Ambulatory Visit: Payer: Self-pay | Admitting: Family Medicine

## 2013-11-15 NOTE — Telephone Encounter (Signed)
Ok plus two ref each

## 2013-11-15 NOTE — Telephone Encounter (Signed)
Seen 10/29/13

## 2013-11-19 NOTE — Telephone Encounter (Signed)
See 3 27 rsponse

## 2013-12-13 NOTE — Telephone Encounter (Signed)
Ok plus three monthly ref 

## 2013-12-24 ENCOUNTER — Ambulatory Visit: Payer: BC Managed Care – PPO | Admitting: Family Medicine

## 2014-01-01 NOTE — Telephone Encounter (Signed)
Ok plus two monthly ref 

## 2014-01-06 ENCOUNTER — Ambulatory Visit (INDEPENDENT_AMBULATORY_CARE_PROVIDER_SITE_OTHER): Payer: BC Managed Care – PPO | Admitting: Family Medicine

## 2014-01-06 ENCOUNTER — Telehealth: Payer: Self-pay | Admitting: Family Medicine

## 2014-01-06 ENCOUNTER — Other Ambulatory Visit: Payer: Self-pay | Admitting: Family Medicine

## 2014-01-06 ENCOUNTER — Encounter: Payer: Self-pay | Admitting: Family Medicine

## 2014-01-06 VITALS — BP 144/78 | Ht 66.0 in | Wt 163.0 lb

## 2014-01-06 DIAGNOSIS — G4700 Insomnia, unspecified: Secondary | ICD-10-CM

## 2014-01-06 DIAGNOSIS — R7301 Impaired fasting glucose: Secondary | ICD-10-CM

## 2014-01-06 DIAGNOSIS — IMO0001 Reserved for inherently not codable concepts without codable children: Secondary | ICD-10-CM

## 2014-01-06 DIAGNOSIS — M549 Dorsalgia, unspecified: Secondary | ICD-10-CM

## 2014-01-06 DIAGNOSIS — R030 Elevated blood-pressure reading, without diagnosis of hypertension: Secondary | ICD-10-CM

## 2014-01-06 MED ORDER — VERAPAMIL HCL ER 180 MG PO TBCR: 180.0000 mg | EXTENDED_RELEASE_TABLET | Freq: Every day | ORAL | Status: DC

## 2014-01-06 MED ORDER — ETODOLAC 400 MG PO TABS: 400.0000 mg | ORAL_TABLET | Freq: Two times a day (BID) | ORAL | Status: DC

## 2014-01-06 NOTE — Telephone Encounter (Signed)
Notified patient that once her referral is processed she will be notified of the doctor and office she is being referred to. Patient verbalized understanding.

## 2014-01-06 NOTE — Progress Notes (Signed)
   Subjective:    Patient ID: Laura Scott, female    DOB: 01/17/1961, 53 y.o.   MRN: [id number]  Back Pain This is a chronic problem. The problem has been gradually worsening since onset. The pain is present in the sacro-iliac. The quality of the pain is described as aching. The pain radiates to the left thigh and right thigh. The pain is the same all the time. Exacerbated by: walking. Stiffness is present all day (when sitting too long). Associated symptoms include weakness. Treatments tried: rx meds  The treatment provided no relief.   Pain in the middle of the back running down  Feels stiff and achey.  Bothers every day took a few days off from work  Considerable lifting and pushing and pulling with job  Aching and hurting  numbers   Long hx of back, rad into both legs  Did chiro care in the past for chronic back pain  States Ambien overall is helping sleep.  Trying to watch diet but she can.  Review of Systems  Musculoskeletal: Positive for back pain.  Neurological: Positive for weakness.       Objective:   Physical Exam Alert mild malaise. Lungs clear heart rare rhythm. Diffuse lumbar tenderness. No discrete positive straight leg raise.       Assessment & Plan:  Impression subacute lumbar strain plan local measures discussed. #2 insomnia clinically stable. #3 history of glucose intolerance discussed. Plan 25 minutes spent most in discussion. Diet discussed exercise discussed. Maintain same chronic meds. Add Lodine twice a day with food when necessary. Chlorzoxazone 500 3 times a day when necessary. Symptomatic care discussed. WSL

## 2014-01-06 NOTE — Telephone Encounter (Signed)
Patient wants to know what the doctors name is that she is being referred to and also wants to know what 2nd blood pressure reading was that Dr. Richardson Landry took.

## 2014-01-14 ENCOUNTER — Telehealth: Payer: Self-pay | Admitting: Family Medicine

## 2014-01-14 DIAGNOSIS — M549 Dorsalgia, unspecified: Secondary | ICD-10-CM

## 2014-01-14 NOTE — Telephone Encounter (Signed)
Seen on 5/18. I do not see a referral in.

## 2014-01-14 NOTE — Telephone Encounter (Signed)
Let's do ref to dr Ace Gins or similar

## 2014-01-14 NOTE — Telephone Encounter (Signed)
Patient would like to check on referral to spine specialist. She says she is still in a lot of pain.

## 2014-01-15 NOTE — Telephone Encounter (Signed)
I put ref in on 5/27

## 2014-01-23 ENCOUNTER — Other Ambulatory Visit: Payer: Self-pay | Admitting: Physical Medicine and Rehabilitation

## 2014-01-23 DIAGNOSIS — M545 Low back pain, unspecified: Secondary | ICD-10-CM

## 2014-01-29 ENCOUNTER — Other Ambulatory Visit: Payer: Self-pay | Admitting: Family Medicine

## 2014-01-29 NOTE — Telephone Encounter (Signed)
May refill this plus one additional 

## 2014-02-01 ENCOUNTER — Other Ambulatory Visit: Payer: BC Managed Care – PPO

## 2014-02-02 ENCOUNTER — Ambulatory Visit
Admission: RE | Admit: 2014-02-02 | Discharge: 2014-02-02 | Disposition: A | Discharge status: Home / Self Care | Payer: BC Managed Care – PPO | Source: Ambulatory Visit | Attending: Physical Medicine and Rehabilitation | Admitting: Physical Medicine and Rehabilitation

## 2014-02-02 DIAGNOSIS — M545 Low back pain, unspecified: Secondary | ICD-10-CM

## 2014-03-03 ENCOUNTER — Telehealth: Payer: Self-pay | Admitting: Family Medicine

## 2014-03-03 NOTE — Telephone Encounter (Signed)
Patient was referred to a spine specialist per Dr. Richardson Landry, but patient said that she is not happy with the results she got from seeing them. She is still in a lot pain and Dr. Richardson Landry mentioned maybe sending her to neurology. Also, she wanted to talk about the doctor at spine specialist wanting to take her out of work. She wanted to make an appointment with Dr. Richardson Landry today or tomorrow to discuss this and discuss the amount of pain she is in but I told her he is out of the office until next week. Please advise on what she should od.

## 2014-03-03 NOTE — Telephone Encounter (Signed)
If the patient feel she needs to be seen this week we can see her but it would be best that she sees Dr. Richardson Landry because he is more familiar with her ongoing care. If she feels she cannot work we can give her a work note through next week when she sees Dr. Richardson Landry. If she feels that she needs something different to try for the pain this can be done on a temporary basis until she is seen. Please discuss her situation with the patient let me know

## 2014-03-03 NOTE — Telephone Encounter (Signed)
Patient was transferred to front desk to schedule appointment for tomorrow.

## 2014-03-04 ENCOUNTER — Encounter: Payer: Self-pay | Admitting: Family Medicine

## 2014-03-04 ENCOUNTER — Ambulatory Visit (INDEPENDENT_AMBULATORY_CARE_PROVIDER_SITE_OTHER): Payer: BC Managed Care – PPO | Admitting: Family Medicine

## 2014-03-04 VITALS — BP 138/88 | Ht 66.0 in | Wt 169.0 lb

## 2014-03-04 DIAGNOSIS — M549 Dorsalgia, unspecified: Secondary | ICD-10-CM

## 2014-03-04 DIAGNOSIS — G8929 Other chronic pain: Secondary | ICD-10-CM

## 2014-03-04 MED ORDER — NAPROXEN 500 MG PO TABS: 500.0000 mg | ORAL_TABLET | Freq: Two times a day (BID) | ORAL | Status: DC

## 2014-03-04 MED ORDER — GABAPENTIN 100 MG PO CAPS
100.0000 mg | ORAL_CAPSULE | Freq: Three times a day (TID) | ORAL | Status: DC
Start: 2014-03-04 — End: 2014-09-09

## 2014-03-04 NOTE — Progress Notes (Signed)
   Subjective:    Patient ID: Laura Scott, female    DOB: 13-Feb-1961, 53 y.o.   MRN: [id number]  HPI Patient is here today to discuss different measures for her back pain. This interesting patient had several months. It got worse to the point where it was radiating down her legs. It is worse with standing and with walking and also to some degree she would have trouble when she was for any length of time but if she lays back he does minimize some she has tried multiple different anti-inflammatories she is even gone and seen Dr. Ace Gins who did a back injection. Unfortunately this did not help. She states that the pain is very severe by the end of the work day. She has tried Lyrica as well as Toradol and indomethacin without help. She's also tried Lodine. She has used muscle relaxers she can't see that those helped any.  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  Otherwise her health is fairly good. She has gotten injections, different meds, and has been to specialist, with still no relief.     Review of Systems She relates burning pain discomfort into the legs more so down the back of her legs and into the thighs she denies any thumb tingling into the feet. She does relate the pain is bad enough to make her feel weak but she denies any specific unilateral weakness no loss of bowel or bladder control    Objective:   Physical Exam Low back mild tenderness negative straight leg raise subjective discomfort in her lower back subjective discomfort into her legs no swelling in the feet. Lungs clear heart regular.       Assessment & Plan:  #1 lumbar pain relatively chronic-abnormal MRI. There is some foraminal stenosis. I really don't know if that is causing all of her pain. She does have some of the symptoms that go along with spinal stenosis. I believe that it is in her best interest to see neurosurgery. She  already has an appointment coming up in several days with Dr. Cyndy Freeze  #2 pain control-I. told the patient at length about the importance of trying to avoid narcotics if at all possible because of potential for becoming dependent on them. She understands this. We will go with Neurontin. Gradually titrate up. Start off 100 mg nightly for 2 nights then 100 mg twice daily for 4 days at 100 mg 3 times a day. When she follows up if this is not doing well enough we will bump up the dose. Patient was told maximum dose 2400 mg.  #3 we will try anti-inflammatories although I told her that that will only diminish the pain is still not totally relieved is Naprosyn 500 mg twice a day safe this from a cardiac standpoint stop other medications.  Currently the patient is trying to work but there is concern that at some point she may be disabled for her job. See back specialist followup with [place] in 3-4 weeks 25 minutes spent with patient. Back exercises shown she should try to strive toward doing those 2 times a day

## 2014-03-04 NOTE — Patient Instructions (Signed)
Do stretching exercises as shown twice daily  See Dr Cyndy Freeze  Stop indomethacin/ keterolac/ topamax  May use robaxin  Start Neurontin (gabapentin)100 mg , start with 1 /night for 2 nights then One twice a day for 4 days then 0ne three times a day  May use Tramadol one every 6 hours as needed for pain  Finally Naproxen 500 one 2 times a day as a anti inflammatory

## 2014-03-12 ENCOUNTER — Other Ambulatory Visit: Payer: Self-pay | Admitting: Family Medicine

## 2014-03-13 ENCOUNTER — Other Ambulatory Visit: Payer: Self-pay | Admitting: Neurosurgery

## 2014-03-17 ENCOUNTER — Encounter (HOSPITAL_COMMUNITY): Payer: Self-pay | Admitting: Pharmacy Technician

## 2014-03-19 ENCOUNTER — Other Ambulatory Visit: Payer: Self-pay | Admitting: Family Medicine

## 2014-03-19 NOTE — Telephone Encounter (Signed)
Ok plus two monthly ref 

## 2014-03-20 ENCOUNTER — Encounter (HOSPITAL_COMMUNITY)
Admission: RE | Admit: 2014-03-20 | Discharge: 2014-03-20 | Disposition: A | Discharge status: Home / Self Care | Payer: BC Managed Care – PPO | Source: Ambulatory Visit | Attending: Neurosurgery | Admitting: Neurosurgery

## 2014-03-20 DIAGNOSIS — Z01812 Encounter for preprocedural laboratory examination: Secondary | ICD-10-CM | POA: Insufficient documentation

## 2014-03-20 DIAGNOSIS — Z01818 Encounter for other preprocedural examination: Secondary | ICD-10-CM | POA: Insufficient documentation

## 2014-03-20 DIAGNOSIS — Z01810 Encounter for preprocedural cardiovascular examination: Secondary | ICD-10-CM | POA: Insufficient documentation

## 2014-03-20 LAB — CBC
HCT: 39.4 % (ref 36.0–46.0)
Hemoglobin: 13.3 g/dL (ref 12.0–15.0)
MCH: 32.7 pg (ref 26.0–34.0)
MCHC: 33.8 g/dL (ref 30.0–36.0)
MCV: 96.8 fL (ref 78.0–100.0)
Platelets: 261 10*3/uL (ref 150–400)
RBC: 4.07 MIL/uL (ref 3.87–5.11)
RDW: 12.3 % (ref 11.5–15.5)
WBC: 6.8 10*3/uL (ref 4.0–10.5)

## 2014-03-20 LAB — BASIC METABOLIC PANEL
Anion gap: 13 (ref 5–15)
BUN: 15 mg/dL (ref 6–23)
CO2: 27 mEq/L (ref 19–32)
Calcium: 9.3 mg/dL (ref 8.4–10.5)
Chloride: 103 mEq/L (ref 96–112)
Creatinine, Ser: 0.67 mg/dL (ref 0.50–1.10)
GFR calc Af Amer: >90 mL/min (ref >90)
GFR calc non Af Amer: >90 mL/min (ref >90)
Glucose, Bld: 111 mg/dL — ABNORMAL HIGH (ref 70–99)
Potassium: 3.7 mEq/L (ref 3.7–5.3)
Sodium: 143 mEq/L (ref 137–147)

## 2014-03-20 LAB — TYPE AND SCREEN
ABO/RH(D): O POS
Antibody Screen: NEGATIVE

## 2014-03-20 LAB — SURGICAL PCR SCREEN
MRSA, PCR: NEGATIVE
Staphylococcus aureus: NEGATIVE

## 2014-03-20 LAB — ABO/RH: ABO/RH(D): O POS

## 2014-03-20 NOTE — Progress Notes (Signed)
Misprint surgery arrival time. Spoke with patient and confirmed arrival at 1:30PM vs AM for surgery on August 5

## 2014-03-20 NOTE — Progress Notes (Signed)
Pt arrived to [facility] for PAT states"was not aware surgery was cancelled"  Advised her to followup with office, will open at 0900.  There is currently no surgery scheduled.

## 2014-03-20 NOTE — Pre-Procedure Instructions (Signed)
Laura Scott  03/20/2014   Your procedure is scheduled on:  March 26, 2014  Report to [facility] Admitting at 1:30 AM.  Call this number if you have problems the morning of surgery: [phone]   Remember:   Do not eat food or drink liquids after midnight.   Take these medicines the morning of surgery with A SIP OF WATER: ALPRAZolam [name]), cetirizine (ZYRTEC), gabapentin  (NEURONTIN), methocarbamol (ROBAXIN)   STOP NAPROXEN   Do not wear jewelry, make-up or nail polish.  Do not wear lotions, powders, or perfumes. You may wear deodorant.  Do not shave 48 hours prior to surgery. Men may shave face and neck.  Do not bring valuables to the hospital.  [facility] is not responsible                  for any belongings or valuables.               Contacts, dentures or bridgework may not be worn into surgery.  Leave suitcase in the car. After surgery it may be brought to your room.  For patients admitted to the hospital, discharge time is determined by your                treatment team.               Patients discharged the day of surgery will not be allowed to drive  home.  Name and phone number of your driver:       Please read over the following fact sheets that you were given: Pain Booklet, Coughing and Deep Breathing, Blood Transfusion Information and Surgical Site Infection Prevention

## 2014-03-25 MED ORDER — VANCOMYCIN HCL IN DEXTROSE 1-5 GM/200ML-% IV SOLN
1000.0000 mg | INTRAVENOUS | Status: AC
  Administered 2014-03-26: 1000 mg via INTRAVENOUS
  Filled 2014-03-25: qty 200

## 2014-03-26 ENCOUNTER — Ambulatory Visit (HOSPITAL_COMMUNITY): Payer: BC Managed Care – PPO

## 2014-03-26 ENCOUNTER — Encounter (HOSPITAL_COMMUNITY): Payer: BC Managed Care – PPO | Admitting: Anesthesiology

## 2014-03-26 ENCOUNTER — Other Ambulatory Visit: Payer: Self-pay | Admitting: Family Medicine

## 2014-03-26 ENCOUNTER — Inpatient Hospital Stay (HOSPITAL_COMMUNITY)
Admission: RE | Admit: 2014-03-26 | Discharge: 2014-04-01 | DRG: 460 | Disposition: A | Discharge status: Home / Self Care | Payer: BC Managed Care – PPO | Source: Ambulatory Visit | Attending: Neurosurgery | Admitting: Neurosurgery

## 2014-03-26 ENCOUNTER — Encounter (HOSPITAL_COMMUNITY): Payer: Self-pay | Admitting: *Deleted

## 2014-03-26 ENCOUNTER — Inpatient Hospital Stay (HOSPITAL_COMMUNITY): Admission: RE | Admit: 2014-03-26 | Payer: BC Managed Care – PPO | Source: Ambulatory Visit | Admitting: Neurosurgery

## 2014-03-26 ENCOUNTER — Encounter (HOSPITAL_COMMUNITY)
Admission: RE | Disposition: A | Discharge status: Home / Self Care | Payer: Self-pay | Source: Ambulatory Visit | Attending: Neurosurgery

## 2014-03-26 ENCOUNTER — Ambulatory Visit (HOSPITAL_COMMUNITY): Payer: BC Managed Care – PPO | Admitting: Anesthesiology

## 2014-03-26 ENCOUNTER — Encounter (HOSPITAL_COMMUNITY): Admission: RE | Payer: Self-pay | Source: Ambulatory Visit

## 2014-03-26 DIAGNOSIS — M47816 Spondylosis without myelopathy or radiculopathy, lumbar region: Secondary | ICD-10-CM | POA: Diagnosis present

## 2014-03-26 DIAGNOSIS — K219 Gastro-esophageal reflux disease without esophagitis: Secondary | ICD-10-CM | POA: Diagnosis present

## 2014-03-26 DIAGNOSIS — G9741 Accidental puncture or laceration of dura during a procedure: Secondary | ICD-10-CM | POA: Diagnosis not present

## 2014-03-26 DIAGNOSIS — Z880 Allergy status to penicillin: Secondary | ICD-10-CM

## 2014-03-26 DIAGNOSIS — M47817 Spondylosis without myelopathy or radiculopathy, lumbosacral region: Principal | ICD-10-CM | POA: Diagnosis present

## 2014-03-26 DIAGNOSIS — IMO0002 Reserved for concepts with insufficient information to code with codable children: Secondary | ICD-10-CM | POA: Diagnosis not present

## 2014-03-26 DIAGNOSIS — I10 Essential (primary) hypertension: Secondary | ICD-10-CM | POA: Diagnosis present

## 2014-03-26 DIAGNOSIS — Z79899 Other long term (current) drug therapy: Secondary | ICD-10-CM

## 2014-03-26 DIAGNOSIS — M4310 Spondylolisthesis, site unspecified: Secondary | ICD-10-CM | POA: Diagnosis present

## 2014-03-26 DIAGNOSIS — Y9210 Unspecified residential institution as the place of occurrence of the external cause: Secondary | ICD-10-CM | POA: Diagnosis not present

## 2014-03-26 SURGERY — POSTERIOR LUMBAR FUSION 2 LEVEL
Anesthesia: General | Site: Back

## 2014-03-26 SURGERY — POSTERIOR LUMBAR FUSION 2 LEVEL
Anesthesia: General

## 2014-03-26 MED ORDER — SODIUM CHLORIDE 0.9 % IJ SOLN
3.0000 mL | Freq: Two times a day (BID) | INTRAMUSCULAR | Status: DC
  Administered 2014-03-27 – 2014-03-28 (×3): 3 mL via INTRAVENOUS

## 2014-03-26 MED ORDER — HYDROMORPHONE HCL PF 1 MG/ML IJ SOLN
INTRAMUSCULAR | Status: AC
  Filled 2014-03-26: qty 1

## 2014-03-26 MED ORDER — DEXAMETHASONE SODIUM PHOSPHATE 4 MG/ML IJ SOLN
INTRAMUSCULAR | Status: AC
  Filled 2014-03-26: qty 1

## 2014-03-26 MED ORDER — DIAZEPAM 5 MG PO TABS
5.0000 mg | ORAL_TABLET | Freq: Four times a day (QID) | ORAL | Status: DC | PRN
  Administered 2014-03-27 – 2014-03-31 (×13): 5 mg via ORAL
  Filled 2014-03-26 (×13): qty 1

## 2014-03-26 MED ORDER — SODIUM CHLORIDE 0.9 % IJ SOLN: 3.0000 mL | INTRAMUSCULAR | Status: DC | PRN

## 2014-03-26 MED ORDER — VERAPAMIL HCL ER 180 MG PO TBCR
180.0000 mg | EXTENDED_RELEASE_TABLET | Freq: Every day | ORAL | Status: DC
  Administered 2014-03-27 – 2014-04-01 (×6): 180 mg via ORAL
  Filled 2014-03-26 (×6): qty 1

## 2014-03-26 MED ORDER — OXYCODONE HCL 5 MG PO TABS
ORAL_TABLET | ORAL | Status: AC
  Filled 2014-03-26: qty 1

## 2014-03-26 MED ORDER — POLYETHYLENE GLYCOL 3350 17 G PO PACK
17.0000 g | PACK | Freq: Every day | ORAL | Status: DC | PRN
  Administered 2014-03-27: 17 g via ORAL
  Filled 2014-03-26 (×2): qty 1

## 2014-03-26 MED ORDER — MENTHOL 3 MG MT LOZG: 1.0000 | LOZENGE | OROMUCOSAL | Status: DC | PRN

## 2014-03-26 MED ORDER — THROMBIN 20000 UNITS EX SOLR
CUTANEOUS | Status: DC | PRN
  Administered 2014-03-26: 18:00:00 via TOPICAL

## 2014-03-26 MED ORDER — 0.9 % SODIUM CHLORIDE (POUR BTL) OPTIME
TOPICAL | Status: DC | PRN
  Administered 2014-03-26: 1000 mL

## 2014-03-26 MED ORDER — NEOSTIGMINE METHYLSULFATE 10 MG/10ML IV SOLN
INTRAVENOUS | Status: AC
Start: 2014-03-26 — End: 2014-03-26
  Filled 2014-03-26: qty 1

## 2014-03-26 MED ORDER — ARTIFICIAL TEARS OP OINT
TOPICAL_OINTMENT | OPHTHALMIC | Status: DC | PRN
  Administered 2014-03-26: 1 via OPHTHALMIC

## 2014-03-26 MED ORDER — DIPHENHYDRAMINE HCL 50 MG/ML IJ SOLN
INTRAMUSCULAR | Status: DC | PRN
  Administered 2014-03-26: 12.5 mg via INTRAVENOUS

## 2014-03-26 MED ORDER — MIDAZOLAM HCL 2 MG/2ML IJ SOLN: 0.5000 mg | Freq: Once | INTRAMUSCULAR | Status: DC | PRN

## 2014-03-26 MED ORDER — GLYCOPYRROLATE 0.2 MG/ML IJ SOLN
INTRAMUSCULAR | Status: DC | PRN
  Administered 2014-03-26: 0.6 mg via INTRAVENOUS

## 2014-03-26 MED ORDER — ZOLPIDEM TARTRATE 5 MG PO TABS
5.0000 mg | ORAL_TABLET | Freq: Every evening | ORAL | Status: DC | PRN
  Administered 2014-03-27 – 2014-04-01 (×6): 5 mg via ORAL
  Filled 2014-03-26 (×6): qty 1

## 2014-03-26 MED ORDER — FENTANYL CITRATE 0.05 MG/ML IJ SOLN
INTRAMUSCULAR | Status: DC | PRN
  Administered 2014-03-26 (×2): 50 ug via INTRAVENOUS
  Administered 2014-03-26: 100 ug via INTRAVENOUS
  Administered 2014-03-26: 50 ug via INTRAVENOUS
  Administered 2014-03-26 (×2): 100 ug via INTRAVENOUS
  Administered 2014-03-26: 50 ug via INTRAVENOUS

## 2014-03-26 MED ORDER — MIDAZOLAM HCL 2 MG/2ML IJ SOLN
INTRAMUSCULAR | Status: AC
  Filled 2014-03-26: qty 2

## 2014-03-26 MED ORDER — MIDAZOLAM HCL 5 MG/5ML IJ SOLN
INTRAMUSCULAR | Status: DC | PRN
  Administered 2014-03-26: 2 mg via INTRAVENOUS

## 2014-03-26 MED ORDER — PROPOFOL 10 MG/ML IV BOLUS
INTRAVENOUS | Status: AC
  Filled 2014-03-26: qty 20

## 2014-03-26 MED ORDER — LORATADINE 10 MG PO TABS
10.0000 mg | ORAL_TABLET | Freq: Every day | ORAL | Status: DC
  Administered 2014-03-27 – 2014-04-01 (×6): 10 mg via ORAL
  Filled 2014-03-26 (×6): qty 1

## 2014-03-26 MED ORDER — POTASSIUM CHLORIDE IN NACL 20-0.9 MEQ/L-% IV SOLN
INTRAVENOUS | Status: DC
  Administered 2014-03-26: 23:00:00 via INTRAVENOUS
  Filled 2014-03-26: qty 1000

## 2014-03-26 MED ORDER — HYDROMORPHONE HCL PF 1 MG/ML IJ SOLN
0.2500 mg | INTRAMUSCULAR | Status: DC | PRN
  Administered 2014-03-26 (×4): 0.5 mg via INTRAVENOUS

## 2014-03-26 MED ORDER — ACETAMINOPHEN 325 MG PO TABS: 650.0000 mg | ORAL_TABLET | ORAL | Status: DC | PRN

## 2014-03-26 MED ORDER — NEOSTIGMINE METHYLSULFATE 10 MG/10ML IV SOLN
INTRAVENOUS | Status: DC | PRN
  Administered 2014-03-26: 4 mg via INTRAVENOUS

## 2014-03-26 MED ORDER — ONDANSETRON HCL 4 MG/2ML IJ SOLN
INTRAMUSCULAR | Status: DC | PRN
  Administered 2014-03-26: 4 mg via INTRAVENOUS

## 2014-03-26 MED ORDER — ROCURONIUM BROMIDE 50 MG/5ML IV SOLN
INTRAVENOUS | Status: AC
  Filled 2014-03-26: qty 1

## 2014-03-26 MED ORDER — DIPHENHYDRAMINE HCL 50 MG/ML IJ SOLN
INTRAMUSCULAR | Status: AC
  Filled 2014-03-26: qty 1

## 2014-03-26 MED ORDER — SENNA 8.6 MG PO TABS
1.0000 | ORAL_TABLET | Freq: Two times a day (BID) | ORAL | Status: DC
  Administered 2014-03-26 – 2014-04-01 (×12): 8.6 mg via ORAL
  Filled 2014-03-26 (×12): qty 1

## 2014-03-26 MED ORDER — ACETAMINOPHEN 650 MG RE SUPP: 650.0000 mg | RECTAL | Status: DC | PRN

## 2014-03-26 MED ORDER — PHENOL 1.4 % MT LIQD: 1.0000 | OROMUCOSAL | Status: DC | PRN

## 2014-03-26 MED ORDER — PROPOFOL 10 MG/ML IV BOLUS
INTRAVENOUS | Status: DC | PRN
  Administered 2014-03-26: 160 mg via INTRAVENOUS

## 2014-03-26 MED ORDER — PROMETHAZINE HCL 25 MG/ML IJ SOLN: 6.2500 mg | INTRAMUSCULAR | Status: DC | PRN

## 2014-03-26 MED ORDER — LIDOCAINE HCL (CARDIAC) 20 MG/ML IV SOLN
INTRAVENOUS | Status: DC | PRN
  Administered 2014-03-26: 100 mg via INTRAVENOUS

## 2014-03-26 MED ORDER — DEXAMETHASONE SODIUM PHOSPHATE 4 MG/ML IJ SOLN
INTRAMUSCULAR | Status: DC | PRN
  Administered 2014-03-26: 4 mg via INTRAVENOUS

## 2014-03-26 MED ORDER — SODIUM CHLORIDE 0.9 % IV SOLN: 250.0000 mL | INTRAVENOUS | Status: DC

## 2014-03-26 MED ORDER — GLYCOPYRROLATE 0.2 MG/ML IJ SOLN
INTRAMUSCULAR | Status: AC
  Filled 2014-03-26: qty 2

## 2014-03-26 MED ORDER — HYDROCODONE-ACETAMINOPHEN 5-325 MG PO TABS: 1.0000 | ORAL_TABLET | ORAL | Status: DC | PRN

## 2014-03-26 MED ORDER — ROCURONIUM BROMIDE 100 MG/10ML IV SOLN
INTRAVENOUS | Status: DC | PRN
  Administered 2014-03-26: 50 mg via INTRAVENOUS

## 2014-03-26 MED ORDER — SCOPOLAMINE 1 MG/3DAYS TD PT72
MEDICATED_PATCH | TRANSDERMAL | Status: DC | PRN
  Administered 2014-03-26: 1 via TRANSDERMAL

## 2014-03-26 MED ORDER — LACTATED RINGERS IV SOLN
INTRAVENOUS | Status: DC
  Administered 2014-03-26: 14:00:00 via INTRAVENOUS

## 2014-03-26 MED ORDER — OXYCODONE HCL 5 MG/5ML PO SOLN: 5.0000 mg | Freq: Once | ORAL | Status: AC | PRN

## 2014-03-26 MED ORDER — FLUTICASONE PROPIONATE 50 MCG/ACT NA SUSP
2.0000 | Freq: Every day | NASAL | Status: DC
  Administered 2014-03-27 – 2014-04-01 (×6): 2 via NASAL
  Filled 2014-03-26 (×2): qty 16

## 2014-03-26 MED ORDER — OXYCODONE HCL 5 MG PO TABS
5.0000 mg | ORAL_TABLET | Freq: Once | ORAL | Status: AC | PRN
  Administered 2014-03-26: 5 mg via ORAL

## 2014-03-26 MED ORDER — ONDANSETRON HCL 4 MG/2ML IJ SOLN
4.0000 mg | INTRAMUSCULAR | Status: DC | PRN
  Administered 2014-03-27 – 2014-03-28 (×4): 4 mg via INTRAVENOUS
  Filled 2014-03-26 (×4): qty 2

## 2014-03-26 MED ORDER — ALPRAZOLAM 0.5 MG PO TABS: 0.5000 mg | ORAL_TABLET | Freq: Three times a day (TID) | ORAL | Status: DC | PRN

## 2014-03-26 MED ORDER — MEPERIDINE HCL 25 MG/ML IJ SOLN: 6.2500 mg | INTRAMUSCULAR | Status: DC | PRN

## 2014-03-26 MED ORDER — FENTANYL CITRATE 0.05 MG/ML IJ SOLN
INTRAMUSCULAR | Status: AC
  Filled 2014-03-26: qty 5

## 2014-03-26 MED ORDER — LACTATED RINGERS IV SOLN
INTRAVENOUS | Status: DC | PRN
  Administered 2014-03-26 (×3): via INTRAVENOUS

## 2014-03-26 MED ORDER — GABAPENTIN 100 MG PO CAPS
100.0000 mg | ORAL_CAPSULE | Freq: Three times a day (TID) | ORAL | Status: DC
  Administered 2014-03-26 – 2014-04-01 (×17): 100 mg via ORAL
  Filled 2014-03-26 (×18): qty 1

## 2014-03-26 MED ORDER — SCOPOLAMINE 1 MG/3DAYS TD PT72
MEDICATED_PATCH | TRANSDERMAL | Status: AC
  Filled 2014-03-26: qty 1

## 2014-03-26 MED ORDER — ONDANSETRON HCL 4 MG/2ML IJ SOLN
INTRAMUSCULAR | Status: AC
  Filled 2014-03-26: qty 2

## 2014-03-26 MED ORDER — VANCOMYCIN HCL IN DEXTROSE 1-5 GM/200ML-% IV SOLN
1000.0000 mg | Freq: Once | INTRAVENOUS | Status: AC
  Administered 2014-03-27: 1000 mg via INTRAVENOUS
  Filled 2014-03-26: qty 200

## 2014-03-26 MED ORDER — LIDOCAINE HCL (CARDIAC) 20 MG/ML IV SOLN
INTRAVENOUS | Status: AC
  Filled 2014-03-26: qty 5

## 2014-03-26 MED ORDER — LIDOCAINE-EPINEPHRINE 0.5 %-1:200000 IJ SOLN
INTRAMUSCULAR | Status: DC | PRN
  Administered 2014-03-26: 10 mL

## 2014-03-26 MED ORDER — HYDROMORPHONE HCL PF 1 MG/ML IJ SOLN
0.5000 mg | INTRAMUSCULAR | Status: DC | PRN
  Administered 2014-03-27 (×2): 1 mg via INTRAVENOUS
  Administered 2014-03-27: 0.5 mg via INTRAVENOUS
  Administered 2014-03-27 – 2014-03-29 (×4): 1 mg via INTRAVENOUS
  Filled 2014-03-26 (×7): qty 1

## 2014-03-26 MED ORDER — OXYCODONE-ACETAMINOPHEN 5-325 MG PO TABS
1.0000 | ORAL_TABLET | ORAL | Status: DC | PRN
  Administered 2014-03-27 – 2014-04-01 (×26): 2 via ORAL
  Filled 2014-03-26 (×27): qty 2

## 2014-03-26 SURGICAL SUPPLY — 66 items
BAG DECANTER FOR FLEXI CONT (MISCELLANEOUS) ×2 IMPLANT
BENZOIN TINCTURE PRP APPL 2/3 (GAUZE/BANDAGES/DRESSINGS) IMPLANT
BLADE SURG ROTATE 9660 (MISCELLANEOUS) IMPLANT
BUR MATCHSTICK NEURO 3.0 LAGG (BURR) ×2 IMPLANT
CAGE COROENT 10X9X23 (Cage) ×4 IMPLANT
CANISTER SUCT 3000ML (MISCELLANEOUS) ×2 IMPLANT
CONT SPEC 4OZ CLIKSEAL STRL BL (MISCELLANEOUS) ×4 IMPLANT
COVER BACK TABLE 24X17X13 BIG (DRAPES) IMPLANT
DECANTER SPIKE VIAL GLASS SM (MISCELLANEOUS) ×2 IMPLANT
DERMABOND ADVANCED (GAUZE/BANDAGES/DRESSINGS) ×1
DERMABOND ADVANCED .7 DNX12 (GAUZE/BANDAGES/DRESSINGS) ×1 IMPLANT
DRAPE C-ARM 42X72 X-RAY (DRAPES) ×4 IMPLANT
DRAPE C-ARMOR (DRAPES) ×2 IMPLANT
DRAPE LAPAROTOMY 100X72X124 (DRAPES) ×2 IMPLANT
DRAPE POUCH INSTRU U-SHP 10X18 (DRAPES) ×2 IMPLANT
DRAPE SURG 17X23 STRL (DRAPES) ×2 IMPLANT
DRSG OPSITE POSTOP 4X6 (GAUZE/BANDAGES/DRESSINGS) ×2 IMPLANT
DRSG TELFA 3X8 NADH (GAUZE/BANDAGES/DRESSINGS) IMPLANT
DURAPREP 26ML APPLICATOR (WOUND CARE) ×2 IMPLANT
ELECT REM PT RETURN 9FT ADLT (ELECTROSURGICAL) ×2
ELECTRODE REM PT RTRN 9FT ADLT (ELECTROSURGICAL) ×1 IMPLANT
GAUZE SPONGE 4X4 12PLY STRL (GAUZE/BANDAGES/DRESSINGS) IMPLANT
GAUZE SPONGE 4X4 16PLY XRAY LF (GAUZE/BANDAGES/DRESSINGS) IMPLANT
GLOVE BIO SURGEON STRL SZ 6.5 (GLOVE) ×2 IMPLANT
GLOVE BIOGEL PI IND STRL 7.5 (GLOVE) ×1 IMPLANT
GLOVE BIOGEL PI INDICATOR 7.5 (GLOVE) ×1
GLOVE ECLIPSE 6.5 STRL STRAW (GLOVE) ×6 IMPLANT
GLOVE ECLIPSE 7.5 STRL STRAW (GLOVE) ×4 IMPLANT
GLOVE EXAM NITRILE LRG STRL (GLOVE) IMPLANT
GLOVE EXAM NITRILE MD LF STRL (GLOVE) ×4 IMPLANT
GLOVE EXAM NITRILE XL STR (GLOVE) IMPLANT
GLOVE EXAM NITRILE XS STR PU (GLOVE) IMPLANT
GOWN STRL REUS W/ TWL LRG LVL3 (GOWN DISPOSABLE) ×1 IMPLANT
GOWN STRL REUS W/ TWL XL LVL3 (GOWN DISPOSABLE) ×1 IMPLANT
GOWN STRL REUS W/TWL 2XL LVL3 (GOWN DISPOSABLE) IMPLANT
GOWN STRL REUS W/TWL LRG LVL3 (GOWN DISPOSABLE) ×1
GOWN STRL REUS W/TWL XL LVL3 (GOWN DISPOSABLE) ×1
GRAFT DURAGEN MATRIX 1WX1L (Tissue) ×2 IMPLANT
KIT BASIN OR (CUSTOM PROCEDURE TRAY) ×2 IMPLANT
KIT POSITION SURG JACKSON T1 (MISCELLANEOUS) ×2 IMPLANT
KIT ROOM TURNOVER OR (KITS) ×2 IMPLANT
MILL MEDIUM DISP (BLADE) ×2 IMPLANT
NEEDLE HYPO 25X1 1.5 SAFETY (NEEDLE) ×2 IMPLANT
NEEDLE SPNL 18GX3.5 QUINCKE PK (NEEDLE) ×2 IMPLANT
NS IRRIG 1000ML POUR BTL (IV SOLUTION) ×2 IMPLANT
PACK LAMINECTOMY NEURO (CUSTOM PROCEDURE TRAY) ×2 IMPLANT
PAD ARMBOARD 7.5X6 YLW CONV (MISCELLANEOUS) ×6 IMPLANT
ROD 35MM (Rod) ×2 IMPLANT
ROD 5.5X40MM (Rod) ×2 IMPLANT
SCREW LOCK (Screw) ×4 IMPLANT
SCREW LOCK FXNS SPNE MAS PL (Screw) ×4 IMPLANT
SCREW SHANK 5.0X30MM (Screw) ×8 IMPLANT
SCREW TULIP 5.5 (Screw) ×8 IMPLANT
SPONGE LAP 4X18 X RAY DECT (DISPOSABLE) IMPLANT
SPONGE SURGIFOAM ABS GEL 100 (HEMOSTASIS) ×2 IMPLANT
STRIP CLOSURE SKIN 1/2X4 (GAUZE/BANDAGES/DRESSINGS) IMPLANT
SUT PROLENE 6 0 BV (SUTURE) IMPLANT
SUT VIC AB 0 CT1 18XCR BRD8 (SUTURE) ×1 IMPLANT
SUT VIC AB 0 CT1 8-18 (SUTURE) ×1
SUT VIC AB 2-0 CT1 18 (SUTURE) ×2 IMPLANT
SUT VIC AB 3-0 SH 8-18 (SUTURE) ×2 IMPLANT
SYR 20ML ECCENTRIC (SYRINGE) ×2 IMPLANT
TOWEL OR 17X24 6PK STRL BLUE (TOWEL DISPOSABLE) ×2 IMPLANT
TOWEL OR 17X26 10 PK STRL BLUE (TOWEL DISPOSABLE) ×2 IMPLANT
TRAY FOLEY CATH 14FRSI W/METER (CATHETERS) ×2 IMPLANT
WATER STERILE IRR 1000ML POUR (IV SOLUTION) ×2 IMPLANT

## 2014-03-26 NOTE — H&P (Signed)
HISTORY OF PRESENT ILLNESS:                     Laura Scott is a 53 year old left-handed woman who presents today for evaluation of pain that she had in the lower back and lower extremities.  This has been going on for about the last two months.  She has had two episodes of urinary incontinence.  She feels weakness in the legs and she has pain in the leg and back.  She has numbness and tingling in the legs and feet.     REVIEW OF SYSTEMS:                                    Positive for eyeglasses, balance problems, hypertension, leg pain with walking, change in bowel habits, leg weakness, arthritis, neck pain, memory problems, and anxiety.  She denies allergic, hematologic, endocrine, skin, genitourinary, constitutional, or respiratory problems.     PAST MEDICAL HISTORY:              Current Medical Conditions:  Includes hypertension.              Prior Operations:  She has had no previous surgeries.              Medications and Allergies:  ALLERGIES TO PENICILLIN TO SULFA MEDICATIONS.     FAMILY HISTORY:                                            Mother 75 is in poor health.  No information provided about the father.     SOCIAL HISTORY:                                            She does not smoke.  She does drink alcohol socially.  She denied use illicit drugs.     PHYSICAL EXAMINATION:                                Vital signs are as follows:  Height 66 inches and weight 167 pounds.  BMI is 26.95.  Blood pressure 129/77, pulse 77, and respiratory rate 18, temperature 97.4 Fahrenheit.  She rates her pain is 5/10.  On physical examination, Laura Scott is alert and oriented x4 and answering all questions appropriately.  2+ reflexes in biceps, triceps, brachioradialis, knees, and ankle.  Toes are downgoing to plantar stimulation.  Romberg is negative.  Muscle tone, bulk, and coordination is normal.  She can toe walk, heel walk, and take a 14-inch step.  Pupils equal, round, and  reactive to light.  Full extraocular movements.  Full visual fields.  Hearing intact to finger rub bilaterally.  Symmetric facial sensation and movement.  Uvula elevates in the midline.  Shoulder shrug is normal.  Tongue protrudes in the midline.  Intact proprioception in the upper and lower extremities.     DIAGNOSTIC STUDIES:                                    Moderate  degenerative changes and facet arthropathy at L4-5 bilaterally.  Significant lateral recess stenosis present.  This is easily seen in both the sagittal and the axial planes.  She has a grade 1 degenerative listhesis of L4 on L5.  Conus is normal.  Cauda equina is normal.     L4-5 is a biggest culprit here.  L3-4 is somewhat suspicious and there does appear to be some facet arthropathy, which is already setting in.  Given the fact that she  has what I believe is a tiny bit of anterolisthesis of L3 on L4, certainly, there listhesis of L4 and L5.  Again, I do believe L4-5 needs to be decompressed, it could have to be done the fusion because after the bony removal and decompression of the lateral recesses.  She would be rendered incompetent joint wise at that level.

## 2014-03-26 NOTE — Transfer of Care (Signed)
Immediate Anesthesia Transfer of Care Note  Patient: Yena Tisby  Procedure(s) Performed: Procedure(s) with comments: POSTERIOR LUMBAR FOUR-FIVE INTERBODY AND FUSION  (N/A) -  L45 posterior lumbar interbody fusion with interbody prosthesis posterior lateral arthrodesis and posterior segmental instrumentation  Patient Location: PACU  Anesthesia Type:General  Level of Consciousness: awake, alert  and oriented  Airway & Oxygen Therapy: Patient Spontanous Breathing and Patient connected to nasal cannula oxygen  Post-op Assessment: Report given to PACU RN and Post -op Vital signs reviewed and stable  Post vital signs: Reviewed and stable  Complications: No apparent anesthesia complications

## 2014-03-26 NOTE — Anesthesia Postprocedure Evaluation (Signed)
  Anesthesia Post-op Note  Patient: Laura Scott  Procedure(s) Performed: Procedure(s) with comments: POSTERIOR LUMBAR FOUR-FIVE INTERBODY AND FUSION  (N/A) -  L45 posterior lumbar interbody fusion with interbody prosthesis posterior lateral arthrodesis and posterior segmental instrumentation  Patient Location: PACU  Anesthesia Type:General  Level of Consciousness: awake, alert , oriented and patient cooperative  Airway and Oxygen Therapy: Patient Spontanous Breathing  Post-op Pain: mild  Post-op Assessment: Post-op Vital signs reviewed, Patient's Cardiovascular Status Stable, Respiratory Function Stable, Patent Airway, No signs of Nausea or vomiting and Pain level controlled  Post-op Vital Signs: stable  Last Vitals:  Filed Vitals:   03/26/14 2100  BP: 158/79  Pulse: 105  Temp:   Resp: 15    Complications: No apparent anesthesia complications

## 2014-03-26 NOTE — Op Note (Signed)
03/26/2014  8:59 PM  PATIENT:  Laura Scott  53 y.o. female  PRE-OPERATIVE DIAGNOSIS:  Acquired spondylolisthesis [738.4] Thoracic or lumbosacral neuritis or radiculitis, unspecified [724.4] Lumbosacral spondylosis without myelopathy [721.3]  POST-OPERATIVE DIAGNOSIS:  Acquired spondylolisthesis [738.4] Thoracic or lumbosacral neuritis or radiculitis, unspecified [724.4] Lumbosacral spondylosis without myelopathy [721.3]  PROCEDURE:  Procedure(s): POSTERIOR LUMBAR FOUR-FIVE INTERBODY AND FUSION L4/5 Posterolateral aRTHRODESISL4/5 NONSEGMENTAL PEDICLE screws(nuvasive)L4/5  SURGEON:  Surgeon(s): Ashok Pall, MD  ASSISTANTS:none  ANESTHESIA:   general  EBL:  Total I/O In: 1500 [I.V.:1500] Out: 250 [Urine:125; Blood:125]  BLOOD ADMINISTERED:none  CELL SAVER GIVEN:none  COUNT:per nursing  DRAINS: none   SPECIMEN:  No Specimen  DICTATION: Laura Scott is a 53 y.o. female whom was taken to the operating room intubated, and placed under a general anesthetic without difficulty. A foley catheter was placed under sterile conditions. She was positioned prone on a Jackson stable with all pressure points properly padded.  Her lumbar region was prepped and draped in a sterile manner. I infiltrated 10cc's 1/2%lidocaine/1:2000,000 strength epinephrine into the planned incision. I opened the skin with a 10 blade and took the incision down to the thoracolumbar fascia. I exposed the lamina of L4,5 in a subperiosteal fashion bilaterally. I confirmed my location with an intraoperative xray.  I placed self retaining retractors and started the decompression.  I decompressed the spinal canal with a laminectomy of L4 using the drill,and Kerrison punches. While exposing the disc space on the left side, I inadvertantly opened the dura on what I believe was an adherent area. I placed a piece of duragen over the opening, then finished the decompression.  PLIF's were performed at L4/5 in the same  fashion. I opened the disc space with a 15 blade then used a variety of instruments to remove the disc and prepare the space for the arthrodesis. I used curettes, rongeurs, punches, shavers for the disc space, and rasps in the discetomy. I measured the disc space and placed 10  Peek cages(nuvasive) into the disc space(s).  I decorticated the lateral bone at L4, and 5. I then placed morselized autograft on the decorticated surfaces to complete the posterolateral arthrodesis.  I placed pedicle screws at L4 and L5, using fluoroscopic guidance. I drilled a pilot hole, then cannulated the pedicle with a hand drill at each site. I then tapped each pedicle, assessing each site for pedicle violations. No cutouts were appreciated. Screws Harlin Heys) were then placed at each site without difficulty. Final films were performed and all screws appeared to be in good position.  I closed the wound in a layered fashion. I approximated the thoracolumbar fascia, subcutaneous, and subcuticular planes with vicryl sutures. I closed the skin with a running 3-0 nylon vertical mattress suture. I used a honeycomb for a sterile dressing.     PLAN OF CARE: Admit to inpatient   PATIENT DISPOSITION:  PACU - hemodynamically stable.   Delay start of Pharmacological VTE agent (>24hrs) due to surgical blood loss or risk of bleeding:  yes

## 2014-03-26 NOTE — Progress Notes (Signed)
Patient arrived to 4N11 AAOx4 and in pain/discomfort. Vital signs were taken, SCD's placed and call bell by her side. Patient is flat in the bed. Will continue to monitor. Laura Scott, Rande Brunt

## 2014-03-26 NOTE — Addendum Note (Signed)
Addendum created 03/26/14 2112 by Mosie Epstein, CRNA   Modules edited: Anesthesia Medication Administration

## 2014-03-26 NOTE — Anesthesia Preprocedure Evaluation (Addendum)
Anesthesia Evaluation  Patient identified by MRN, date of birth, ID band Patient awake    Reviewed: Allergy & Precautions, H&P , NPO status , Patient's Chart, lab work & pertinent test results  History of Anesthesia Complications Negative for: history of anesthetic complications  Airway Mallampati: II TM Distance: >3 FB Neck ROM: Full    Dental  (+) Teeth Intact, Dental Advisory Given, Caps,    Pulmonary neg pulmonary ROS,  breath sounds clear to auscultation        Cardiovascular hypertension, Pt. on medications - anginanegative cardio ROS  Rhythm:Regular Rate:Normal     Neuro/Psych Anxiety Depression Back pain: tramadol    GI/Hepatic Neg liver ROS, GERD-  Controlled,  Endo/Other  negative endocrine ROS  Renal/GU negative Renal ROS     Musculoskeletal   Abdominal   Peds  Hematology negative hematology ROS (+)   Anesthesia Other Findings Chipped tooth left bottom per pt. Front right on examination.  Reproductive/Obstetrics                       Anesthesia Physical Anesthesia Plan  ASA: II  Anesthesia Plan: General   Post-op Pain Management:    Induction: Intravenous  Airway Management Planned: Oral ETT  Additional Equipment:   Intra-op Plan:   Post-operative Plan: Extubation in OR  Informed Consent: I have reviewed the patients History and Physical, chart, labs and discussed the procedure including the risks, benefits and alternatives for the proposed anesthesia with the patient or authorized representative who has indicated his/her understanding and acceptance.   Dental advisory given  Plan Discussed with: CRNA and Surgeon  Anesthesia Plan Comments: (Plan routine monitors, GETA)        Anesthesia Quick Evaluation

## 2014-03-26 NOTE — Telephone Encounter (Signed)
May refill plus couple ref

## 2014-03-27 MED ORDER — ALUM & MAG HYDROXIDE-SIMETH 200-200-20 MG/5ML PO SUSP
30.0000 mL | Freq: Once | ORAL | Status: AC
  Administered 2014-03-27: 30 mL via ORAL

## 2014-03-27 MED ORDER — ALUM & MAG HYDROXIDE-SIMETH 200-200-20 MG/5ML PO SUSP
15.0000 mL | ORAL | Status: DC | PRN
  Filled 2014-03-27: qty 30

## 2014-03-27 NOTE — Progress Notes (Signed)
PT Cancellation Note  Patient Details Name: Lakrisha Iseman MRN: [id number] DOB: 01/13/1961   Cancelled Treatment:    Reason Eval/Treat Not Completed: Medical issues which prohibited therapy.  Pt on bed rest for 72 hours. 03/27/2014  Donnella Sham, [place] [phone]  (pager)   Xzander Gilham, Tessie Fass 03/27/2014, 10:28 AM

## 2014-03-27 NOTE — Progress Notes (Signed)
OT Cancellation Note  Patient Details Name: Laura Scott MRN: [id number] DOB: 1961-05-31   Cancelled Treatment:    Reason Eval/Treat Not Completed: Patient not medically ready (Per RN flat bedrest for 72 hours ) OT ordered and holding evaluation at this time until 03/30/14 .  Please update orders for bedrest at this time. Please reorder OT when appropriate to return for evaluation  Peri Maris Pager: [phone]  03/27/2014, 10:29 AM

## 2014-03-27 NOTE — Progress Notes (Signed)
Paged MD for something for indigestion for  patient. Still awaiting reply

## 2014-03-27 NOTE — Clinical Social Work Note (Signed)
CSW consulted for possible SNF placement at time of discharge. CSW to continue to follow and assist with discharge once disposition determined. Thank you for the referral.   Lubertha Sayres, MSW, [facility] Licensed Clinical Social Worker [phone] and [phone]

## 2014-03-27 NOTE — Progress Notes (Signed)
Patient ID: Laura Scott, female   DOB: [birth year], 53 y.o.   MRN: [id number] BP 155/62  Pulse 110  Temp(Src) 99.9 F (37.7 C) (Oral)  Resp 20  Ht 5\' 6"  (1.676 m)  Wt 80 kg (176 lb 5.9 oz)  BMI 28.48 kg/m2  SpO2 98% Alert and oriented x 4 Wound dressing clean, dry Normal strength in lower extremities Flat another 48hours.

## 2014-03-27 NOTE — Progress Notes (Signed)
Utilization review completed. Rozanna Boer, RN, BSN.,

## 2014-03-28 MED ORDER — BISACODYL 10 MG RE SUPP
10.0000 mg | Freq: Every day | RECTAL | Status: DC | PRN
  Administered 2014-03-31: 10 mg via RECTAL
  Filled 2014-03-28: qty 1

## 2014-03-28 MED ORDER — FLEET ENEMA 7-19 GM/118ML RE ENEM
1.0000 | ENEMA | Freq: Every day | RECTAL | Status: DC | PRN
  Administered 2014-03-29: 1 via RECTAL
  Filled 2014-03-28 (×2): qty 1

## 2014-03-28 NOTE — Progress Notes (Signed)
PT Cancellation Note  Patient Details Name: Laura Scott MRN: [id number] DOB: August 22, 1961   Cancelled Treatment:    Reason Eval/Treat Not Completed: Medical issues which prohibited therapy. Pt on bedrest for 48 hours. Will re-attempt evaluation when pt is medically ready.    Elie Confer [place], [place] 03/28/2014, 12:44 PM

## 2014-03-28 NOTE — Progress Notes (Signed)
Patient ID: Laura Scott, female   DOB: [birth year], 53 y.o.   MRN: [id number] BP 153/75  Pulse 93  Temp(Src) 98.5 F (36.9 C) (Oral)  Resp 18  Ht 5\' 6"  (1.676 m)  Wt 80 kg (176 lb 5.9 oz)  BMI 28.48 kg/m2  SpO2 100% Alert and oriented x 4 Speech is clear, and fluent Normal strength in lower extremities Wound is clean, flat, and dry May get up tomorrow approximately 2100

## 2014-03-29 MED ORDER — MAGNESIUM CITRATE PO SOLN
1.0000 | Freq: Once | ORAL | Status: AC
  Administered 2014-03-29: 1 via ORAL
  Filled 2014-03-29: qty 296

## 2014-03-29 NOTE — Progress Notes (Signed)
Patient ID: Laura Scott, female   DOB: [birth year], 53 y.o.   MRN: [id number] Afeb, vss No new neuro issues Laying flat in bed Will start to increase activity tonight per Dr Christella Noa.

## 2014-03-29 NOTE — Progress Notes (Signed)
PT Cancellation Note  Patient Details Name: Laura Scott MRN: [id number] DOB: 1961/04/21   Cancelled Treatment:    Reason Eval/Treat Not Completed: Other (comment) (pt on besrest until 9 pm tonight per RN. ).  PT will check back tomorrow and complete evaluation.  Thanks,    Barbarann Ehlers. [place], [place], DPT [phone]   03/29/2014, 3:00 PM

## 2014-03-30 NOTE — Evaluation (Signed)
Physical Therapy Evaluation Patient Details Name: Laura Scott MRN: [id number] DOB: 08-19-1961 Today's Date: 03/30/2014   History of Present Illness  Patient is a 53 yo female admitted 03/26/14 with back and leg pain.  Patient now s/p L4-5 PLIF.  Patient on bedrest following surgery for 72 hours - now with OOB orders.  PMH:  19-months of back and LE pain/numbness, change in bowel habits; HTN, arthritis, memory problems, anxiety  Clinical Impression  Patient presents with problems listed below.  Will benefit from acute PT to maximize independence prior to discharge home.    Follow Up Recommendations Home health PT;Supervision/Assistance - 24 hour    Equipment Recommendations  Rolling walker with 5" wheels;3in1 (PT) (Patient may be able to get RW from friend. To let PT know)    Recommendations for Other Services       Precautions / Restrictions Precautions Precautions: Back;Fall Precaution Booklet Issued: Yes (comment) Precaution Comments: Reviewed back precautions and other information with patient Restrictions Weight Bearing Restrictions: No      Mobility  Bed Mobility Overal bed mobility: Needs Assistance Bed Mobility: Rolling;Sidelying to Sit;Sit to Sidelying Rolling: Supervision (with use of rail) Sidelying to sit: Min assist     Sit to sidelying: Min assist General bed mobility comments: Verbal cues for technique including log rolling and moving through sidelying.  Assist to raise trunk to sitting position, with cues for hand placement.  Assist to bring LE's onto bed to return to sidelying.  Transfers Overall transfer level: Needs assistance Equipment used: Rolling walker (2 wheeled) Transfers: Sit to/from Stand Sit to Stand: Min assist         General transfer comment: Verbal cues for technique.  Patient able to scoot to EOB with supervision.  Assist to rise to standing and for balance/safety.  Ambulation/Gait Ambulation/Gait assistance: Min guard Ambulation  Distance (Feet): 84 Feet Assistive device: Rolling walker (2 wheeled) Gait Pattern/deviations: Step-through pattern;Decreased step length - right;Decreased step length - left;Decreased stride length;Shuffle;Narrow base of support Gait velocity: Very slow Gait velocity interpretation: Below normal speed for age/gender General Gait Details: Verbal cues for safe use of RW.  Patient maintains upright posture.  Gait is very slow and guarded, taking small steps with both feet.  Keeps trunk rigid.  Patient reports increased pain in RLE with gait.  Stairs            Wheelchair Mobility    Modified Rankin (Stroke Patients Only)       Balance                                             Pertinent Vitals/Pain Pain Assessment: 0-10 Pain Score: 8  Pain Location: Bil LE's with Rt > Lt Pain Descriptors / Indicators: Aching;Numbness Pain Intervention(s): Limited activity within patient's tolerance;Repositioned;Patient requesting pain meds-RN notified;RN gave pain meds during session    Home Living Family/patient expects to be discharged to:: Private residence Living Arrangements: Other relatives (Niece) Available Help at Discharge: Family;Available 24 hours/day (Niece and sister) Type of Home: House ([place]) Home Access: Stairs to enter Entrance Stairs-Rails: Left Entrance Stairs-Number of Steps: 3 Home Layout: One level Home Equipment: None (Reports she can get a RW - will let PT know tomorrow)      Prior Function Level of Independence: Independent (Though ambulating with flexed posture)  Hand Dominance        Extremity/Trunk Assessment   Upper Extremity Assessment: Overall WFL for tasks assessed           Lower Extremity Assessment: Generalized weakness;RLE deficits/detail;LLE deficits/detail RLE Deficits / Details: Strength grossly 4/5 LLE Deficits / Details: Strength grossly 4/5     Communication   Communication: No  difficulties  Cognition Arousal/Alertness: Awake/alert Behavior During Therapy: WFL for tasks assessed/performed Overall Cognitive Status: Within Functional Limits for tasks assessed                      General Comments      Exercises        Assessment/Plan    PT Assessment Patient needs continued PT services  PT Diagnosis Difficulty walking;Abnormality of gait;Acute pain   PT Problem List Decreased strength;Decreased activity tolerance;Decreased balance;Decreased mobility;Decreased knowledge of use of DME;Decreased knowledge of precautions;Impaired sensation;Pain  PT Treatment Interventions DME instruction;Gait training;Stair training;Functional mobility training;Therapeutic activities;Patient/family education   PT Goals (Current goals can be found in the Care Plan section) Acute Rehab PT Goals Patient Stated Goal: To decrease pain PT Goal Formulation: With patient Time For Goal Achievement: 04/06/14 Potential to Achieve Goals: Good    Frequency Min 5X/week   Barriers to discharge        Co-evaluation               End of Session Equipment Utilized During Treatment: Gait belt Activity Tolerance: Patient limited by pain;Patient limited by fatigue Patient left: in bed;with call bell/phone within reach;with bed alarm set;with family/visitor present Nurse Communication: Mobility status         Time: 1020-1047 PT Time Calculation (min): 27 min   Charges:   PT Evaluation $Initial PT Evaluation Tier I: 1 Procedure PT Treatments $Gait Training: 8-22 mins $Therapeutic Activity: 8-22 mins   PT G Codes:          Despina Pole 03/30/2014, 1:39 PM Carita Pian. Sanjuana Kava, [place] Pager [phone]

## 2014-03-30 NOTE — Progress Notes (Signed)
Patient ID: Laura Scott, female   DOB: 10/07/1960, 53 y.o.   MRN: [id number] Subjective:  The patient is alert and pleasant. She denies headaches.  Objective: Vital signs in last 24 hours: Temp:  [98.3 F (36.8 C)-99.8 F (37.7 C)] 98.4 F (36.9 C) (08/09 0547) Pulse Rate:  [87-105] 87 (08/09 0547) Resp:  [18-20] 18 (08/08 2207) BP: (135-152)/(63-70) 135/69 mmHg (08/09 0547) SpO2:  [9 %-100 %] 9 % (08/09 0547)  Intake/Output from previous day: 08/08 0701 - 08/09 0700 In: -  Out: 650 [Urine:650] Intake/Output this shift:    Physical exam the patient is alert and oriented x3. She is moving her lower extremities well. Her wound is healing well without drainage.  Lab Results: No results found for this basename: WBC, HGB, HCT, PLT,  in the last 72 hours BMET No results found for this basename: NA, K, CL, CO2, GLUCOSE, BUN, CREATININE, CALCIUM,  in the last 72 hours  Studies/Results: No results found.  Assessment/Plan: Postop day #4: We will mobilize the patient with PT. She will likely go home in a few days.  LOS: 4 days     Laura Scott D 03/30/2014, 10:15 AM

## 2014-03-30 NOTE — Progress Notes (Signed)
Occupational Therapy Evaluation Patient Details Name: Shalva Rozycki MRN: [id number] DOB: 1961-08-01 Today's Date: 03/30/2014    History of Present Illness Patient is a 53 yo female admitted 03/26/14 with back and leg pain.  Patient now s/p L4-5 PLIF.  Patient on bedrest following surgery for 72 hours - now with OOB orders.  PMH:  45-months of back and LE pain/numbness, change in bowel habits; HTN, arthritis, memory problems, anxiety   Clinical Impression   PTA pt lived at home alone and was independent with ADLs and functional mobility. Pt moving slowly but well this afternoon and able to ambulate in hallways with supervision. Pt requires Min (A) to power up from sitting due to Bil LE weakness. Educated pt on back precautions and incorporating into ADLs with compensatory techniques. Pt would benefit from skilled OT to address tub transfer, functional mobility, and LB ADLS.     Follow Up Recommendations  No OT follow up;Supervision/Assistance - 24 hour    Equipment Recommendations  3 in 1 bedside comode       Precautions / Restrictions Precautions Precautions: Back;Fall Precaution Booklet Issued: Yes (comment) Precaution Comments: Educated pt on 3/3 back precautions and incorporating into ADLs.  Restrictions Weight Bearing Restrictions: No      Mobility Bed Mobility Overal bed mobility: Needs Assistance Bed Mobility: Rolling;Sidelying to Sit;Sit to Sidelying Rolling: Supervision Sidelying to sit: Supervision     Sit to sidelying: Supervision General bed mobility comments: VC's for technique initially. Pt able to roll and no physical assist required for truncal support.   Transfers Overall transfer level: Needs assistance Equipment used: Rolling walker (2 wheeled) Transfers: Sit to/from Stand Sit to Stand: Min assist         General transfer comment: VC's for hand placement. (A) to stabilize RW and close guard to power up from bed at lowest setting due to Bil LE  weakness.    Balance Overall balance assessment: Needs assistance Sitting-balance support: No upper extremity supported;Feet supported Sitting balance-Leahy Scale: Good     Standing balance support: Bilateral upper extremity supported;During functional activity Standing balance-Leahy Scale: Fair                              ADL Overall ADL's : Needs assistance/impaired Eating/Feeding: Independent;Sitting   Grooming: Min guard;Standing   Upper Body Bathing: Set up;Sitting   Lower Body Bathing: Minimal assistance;Sit to/from stand   Upper Body Dressing : Set up;Sitting   Lower Body Dressing: Minimal assistance;Sit to/from stand   Toilet Transfer: Minimal assistance;Ambulation;RW   Toileting- Clothing Manipulation and Hygiene: Minimal assistance;Sit to/from stand       Functional mobility during ADLs: Min guard;Rolling walker General ADL Comments: Pt moving well with minimal c/o pain in surgical site. Practiced bed mobility with good technique. Pt ambulated in hallway and educated on incorporating back precautions into ADLs. Educated pt on use of toilet aid (or tongs/baby wipes) for toilet hygiene and compensatory techniques for ADLs. Pt is able to cross legs to don/doff socks.  Educated pt on fall prevention and energy conservation. Pt would like to practice tub transfer before return home.       Vision  No visual impairments. Pt reports no change from baseline.   No apparent visual deficits.                  Perception Perception Perception Tested?: No   Praxis Praxis Praxis tested?: Within functional limits    Pertinent  Vitals/Pain Pain Assessment: 0-10 Pain Score: 2  Pain Location: back surgical site Pain Descriptors / Indicators: Aching Pain Intervention(s): Monitored during session;Repositioned     Hand Dominance Right   Extremity/Trunk Assessment Upper Extremity Assessment Upper Extremity Assessment: Overall WFL for tasks assessed    Lower Extremity Assessment Lower Extremity Assessment: Defer to PT evaluation RLE Deficits / Details: Strength grossly 4/5 RLE: Unable to fully assess due to pain RLE Sensation:  (Reports LE feels "asleep") LLE Deficits / Details: Strength grossly 4/5 LLE: Unable to fully assess due to pain LLE Sensation:  (Reports LE feels "asleep")   Cervical / Trunk Assessment Cervical / Trunk Assessment: Normal   Communication Communication Communication: No difficulties   Cognition Arousal/Alertness: Awake/alert Behavior During Therapy: WFL for tasks assessed/performed Overall Cognitive Status: Within Functional Limits for tasks assessed                                Home Living Family/patient expects to be discharged to:: Private residence Living Arrangements: Other relatives (Niece) Available Help at Discharge: Family;Available 24 hours/day (Niece and sister) Type of Home: House ([place]) Home Access: Stairs to enter Entrance Stairs-Number of Steps: 3 Entrance Stairs-Rails: Left Home Layout: One level     Bathroom Shower/Tub: Tub/shower unit Shower/tub characteristics: Architectural technologist: Standard     Home Equipment: None          Prior Functioning/Environment Level of Independence: Independent             OT Diagnosis: Generalized weakness;Acute pain   OT Problem List: Decreased strength;Decreased range of motion;Decreased activity tolerance;Impaired balance (sitting and/or standing);Decreased safety awareness;Decreased knowledge of use of DME or AE;Decreased knowledge of precautions;Pain   OT Treatment/Interventions: Self-care/ADL training;Therapeutic exercise;Energy conservation;DME and/or AE instruction;Therapeutic activities;Patient/family education;Balance training    OT Goals(Current goals can be found in the care plan section) Acute Rehab OT Goals Patient Stated Goal: To be independent OT Goal Formulation: With patient Time For Goal  Achievement: 04/06/14 Potential to Achieve Goals: Good ADL Goals Pt Will Perform Grooming: with modified independence;standing Pt Will Perform Lower Body Bathing: with modified independence;sit to/from stand Pt Will Perform Lower Body Dressing: with modified independence;sit to/from stand Pt Will Transfer to Toilet: with modified independence;ambulating;bedside commode Pt Will Perform Toileting - Clothing Manipulation and hygiene: with modified independence;sit to/from stand Pt Will Perform Tub/Shower Transfer: Tub transfer;with modified independence;ambulating;3 in 1;rolling walker  OT Frequency: Min 2X/week              End of Session Equipment Utilized During Treatment: Gait belt;Rolling walker Nurse Communication: Mobility status  Activity Tolerance: Patient tolerated treatment well Patient left: in bed;with call bell/phone within reach;with bed alarm set;with family/visitor present   Time: 1443-1540 OT Time Calculation (min): 37 min Charges:  OT General Charges $OT Visit: 1 Procedure OT Evaluation $Initial OT Evaluation Tier I: 1 Procedure OT Treatments $Self Care/Home Management : 8-22 mins $Therapeutic Activity: 8-22 mins  Juluis Rainier [phone] 03/30/2014, 4:55 PM

## 2014-03-30 NOTE — Progress Notes (Signed)
2100 pt ambulated in room with one assist and walker. Pt voided and had minimal BM, mostly gas, still complains of abdominal pain. Pt tolerated ambulation well.No complaints of HA, dizziness, etc. Will continue to monitor.

## 2014-03-31 NOTE — Progress Notes (Addendum)
Occupational Therapy Treatment Patient Details Name: Laura Scott MRN: [id number] DOB: 1961/03/19 Today's Date: 03/31/2014    History of present illness Patient is a 53 yo female admitted 03/26/14 with back and leg pain.  Patient now s/p L4-5 PLIF.  Patient on bedrest following surgery for 72 hours - now with OOB orders.  PMH:  26-months of back and LE pain/numbness, change in bowel habits; HTN, arthritis, memory problems, anxiety   OT comments  Pt moving well. Education provided during session.   Follow Up Recommendations  No OT follow up;Supervision/Assistance - 24 hour    Equipment Recommendations  3 in 1 bedside comode (pt going to work out getting shower chair)   Recommendations for Other Services      Precautions / Restrictions Precautions Precautions: Back;Fall Precaution Comments: Reviewed precautions with pt Restrictions Weight Bearing Restrictions: No       Mobility Bed Mobility Overal bed mobility: Needs Assistance Bed Mobility: Rolling;Sidelying to Sit;Sit to Sidelying Rolling: Supervision Sidelying to sit: Modified independent (Device/Increase time)     Sit to sidelying: Modified independent (Device/Increase time) General bed mobility comments: cues not to twist with rolling  Transfers Overall transfer level: Needs assistance Equipment used: Rolling walker (2 wheeled) Transfers: Sit to/from Stand Sit to Stand: Supervision/Min guard         General transfer comment: cues for hand placement/technique.    Balance                                   ADL Overall ADL's : Needs assistance/impaired     Grooming: Wash/dry face;Oral care;Set up;Supervision/safety;Standing                   Toilet Transfer: Supervision/safety;Ambulation;RW (3 in 1 over commode)   Toileting- Clothing Manipulation and Hygiene: Minimal assistance;Sit to/from stand   Tub/ Shower Transfer: Minimal assistance;Ambulation;3 in 1;Rolling walker   Functional  mobility during ADLs: Supervision/safety;Rolling walker General ADL Comments: Educated on toilet aid for hygiene. Educated on use of cup for teeth care and placement of grooming items to avoid breaking precautions. Educated on use of bag on walker, safe shoewear, discussed rug in bathroom. Recommended someone be with pt for tub transfer. Pt ambulated to gym and practiced this-explained different options for tub transfer. Educated on AE for LB ADLs. Discussed what pt could use for shower chair. Discussed positioning of pillows. Discussed how sometimes it can vary day to day whether someone can cross legs over knees for ADLs.       Vision                     Perception     Praxis      Cognition   Behavior During Therapy: WFL for tasks assessed/performed Overall Cognitive Status: Within Functional Limits for tasks assessed                       Extremity/Trunk Assessment               Exercises     Shoulder Instructions       General Comments      Pertinent Vitals/ Pain       Pain Assessment: 0-10 Pain Score: 8  Pain Location: buttocks/back surgical site Pain Descriptors / Indicators: Aching Pain Intervention(s): Monitored during session;Patient requesting pain meds-RN notified  Home Living  Prior Functioning/Environment              Frequency Min 2X/week     Progress Toward Goals  OT Goals(current goals can now be found in the care plan section)  Progress towards OT goals: Progressing toward goals  Acute Rehab OT Goals Patient Stated Goal: to do everything she was before OT Goal Formulation: With patient Time For Goal Achievement: 04/06/14 Potential to Achieve Goals: Good ADL Goals Pt Will Perform Grooming: with modified independence;standing Pt Will Perform Lower Body Bathing: with modified independence;sit to/from stand Pt Will Perform Lower Body Dressing: with modified  independence;sit to/from stand Pt Will Transfer to Toilet: with modified independence;ambulating;bedside commode Pt Will Perform Toileting - Clothing Manipulation and hygiene: with modified independence;sit to/from stand Pt Will Perform Tub/Shower Transfer: Tub transfer;with modified independence;ambulating;3 in 1;rolling walker  Plan Discharge plan remains appropriate    Co-evaluation                 End of Session Equipment Utilized During Treatment: Gait belt;Rolling walker   Activity Tolerance Patient tolerated treatment well   Patient Left in bed;with call bell/phone within reach   Nurse Communication  (pt hit call button for pain meds)        Time: 9741-6384 OT Time Calculation (min): 33 min  Charges: OT General Charges $OT Visit: 1 Procedure OT Treatments $Self Care/Home Management : 8-22 mins $Therapeutic Activity: 8-22 mins  Benito Mccreedy OTR/L [phone] 03/31/2014, 10:05 AM

## 2014-03-31 NOTE — Progress Notes (Signed)
UR complete.  Geraldina Parrott RN, MSN 

## 2014-03-31 NOTE — Progress Notes (Signed)
Physical Therapy Treatment Patient Details Name: Stephenie Navejas MRN: [id number] DOB: 1961-01-22 Today's Date: 03/31/2014    History of Present Illness Patient is a 53 yo female admitted 03/26/14 with back and leg pain.  Patient now s/p L4-5 PLIF.  Patient on bedrest following surgery for 72 hours - now with OOB orders.  PMH:  64-months of back and LE pain/numbness, change in bowel habits; HTN, arthritis, memory problems, anxiety    PT Comments    Pt had many family members visiting including sister who will help care for her.  Took pt and sister through sequence for safe chair positioning and transfers, then spoke with case manager for her to get a plan for when she would hear about discharge orders.  MD is not finished with orders and will have to wait for tomorrow.  Follow Up Recommendations  Home health PT;Supervision/Assistance - 24 hour     Equipment Recommendations  Rolling walker with 5" wheels;3in1 (PT)    Recommendations for Other Services       Precautions / Restrictions Precautions Precautions: Back;Fall Precaution Booklet Issued: Yes (comment) Precaution Comments: Reviewed precautions with pt Restrictions Weight Bearing Restrictions: No    Mobility  Bed Mobility                  Transfers Overall transfer level: Needs assistance Equipment used: Rolling walker (2 wheeled) Transfers: Sit to/from Stand Sit to Stand: Min guard;Supervision         General transfer comment: cues for hand placement/technique.  Ambulation/Gait Ambulation/Gait assistance: Supervision Ambulation Distance (Feet): 250 Feet Assistive device: Rolling walker (2 wheeled) Gait Pattern/deviations: Step-through pattern;Wide base of support;Ataxic;Decreased dorsiflexion - right;Decreased dorsiflexion - left Gait velocity: slower Gait velocity interpretation: Below normal speed for age/gender General Gait Details: Cues for use of walker on hallway with pacing and postural  control   Stairs Stairs: Yes Stairs assistance: Min guard Stair Management: One rail Left;Alternating pattern;Step to pattern Number of Stairs: 10 General stair comments: Verbally cued for placement of railing on her steps at home, but concern is that she has one rail usable.  Pt reminded to slow down and  have walker ready at the bottom of the stairs  Wheelchair Mobility    Modified Rankin (Stroke Patients Only)       Balance Overall balance assessment: Needs assistance Sitting-balance support: Feet supported Sitting balance-Leahy Scale: Fair Sitting balance - Comments: back posture guarded in recliner with pillows   Standing balance support: Bilateral upper extremity supported Standing balance-Leahy Scale: Fair                      Cognition Arousal/Alertness: Awake/alert Behavior During Therapy: WFL for tasks assessed/performed Overall Cognitive Status: Within Functional Limits for tasks assessed                      Exercises      General Comments General comments (skin integrity, edema, etc.): family in to observe and ask questions as she plans to have sister and neice assist once home      Pertinent Vitals/Pain Pain Assessment: 0-10 Pain Score: 8  Pain Location: buttocks/back surgical site Pain Descriptors / Indicators: Aching;Cramping Pain Intervention(s): RN gave pain meds during session;Limited activity within patient's tolerance    Home Living Family/patient expects to be discharged to:: Private residence Living Arrangements: Other relatives Available Help at Discharge: Family;Available 24 hours/day Type of Home: House Home Access: Stairs to enter Entrance Stairs-Rails: Left Home Layout: One  level Home Equipment: None      Prior Function Level of Independence: Independent          PT Goals (current goals can now be found in the care plan section) Acute Rehab PT Goals Patient Stated Goal: To arrange her serivces for home     Frequency  Min 5X/week    PT Plan      Co-evaluation             End of Session Equipment Utilized During Treatment: Gait belt Activity Tolerance: Patient limited by pain Patient left: in chair;with call bell/phone within reach;with family/visitor present     Time: 1350-1415 PT Time Calculation (min): 25 min  Charges:  $Gait Training: 8-22 mins $Therapeutic Activity: 8-22 mins                    G Codes:      Ramond Dial 2014-04-14, 3:12 PM  Mee Hives, PT MS Acute Rehab Dept. Number: [phone]

## 2014-03-31 NOTE — Progress Notes (Signed)
Patient ID: Laura Scott, female   DOB: [birth year], 53 y.o.   MRN: [id number] Subjective:  The patient is alert and pleasant. She looks well. She wants to get some more physical therapy and home tomorrow.  Objective: Vital signs in last 24 hours: Temp:  [98.1 F (36.7 C)-98.7 F (37.1 C)] 98.3 F (36.8 C) (08/10 0600) Pulse Rate:  [82-91] 85 (08/10 0600) Resp:  [18-20] 18 (08/10 0600) BP: (123-154)/(56-73) 134/69 mmHg (08/10 0600) SpO2:  [98 %-100 %] 100 % (08/10 0600)  Intake/Output from previous day:   Intake/Output this shift:    Physical exam the patient is alert and oriented. She is moving her lower extremities well. Her wound is healing well without discharge.  Lab Results: No results found for this basename: WBC, HGB, HCT, PLT,  in the last 72 hours BMET No results found for this basename: NA, K, CL, CO2, GLUCOSE, BUN, CREATININE, CALCIUM,  in the last 72 hours  Studies/Results: No results found.  Assessment/Plan: Postop day #5: The patient is doing well. She will likely go home tomorrow. We will continue physical therapy.  LOS: 5 days     Lovelle Deitrick D 03/31/2014, 9:56 AM

## 2014-03-31 NOTE — Clinical Social Work Note (Signed)
Per chart review, pt to be discharged home with home health services. CSW signing off. Thank you for the referral.  Lubertha Sayres, MSW, [facility] Licensed Clinical Social Worker [phone] and [phone]

## 2014-04-01 MED ORDER — OXYCODONE-ACETAMINOPHEN 5-325 MG PO TABS: 1.0000 | ORAL_TABLET | ORAL | Status: DC | PRN

## 2014-04-01 NOTE — Discharge Summary (Signed)
Physician Discharge Summary  Patient ID: Laura Scott MRN: [id number] DOB/AGE: 53-Aug-1962 53 y.o.  Admit date: 03/26/2014 Discharge date: 04/01/2014  Admission Diagnoses:  Acquired spondylolisthesis [738.4] Thoracic or lumbosacral neuritis or radiculitis, unspecified [724.4] Lumbosacral spondylosis without myelopathy [721.3]  Discharge Diagnoses:  Acquired spondylolisthesis [738.4] Thoracic or lumbosacral neuritis or radiculitis, unspecified [724.4] Lumbosacral spondylosis without myelopathy [721.3] Active Problems:   Lumbar spondylosis   Discharged Condition: good  Hospital Course: Patient is under the care of Dr. Ashok Pall, who admitted the patient and performed a L4-5 lumbar decompression, PLIF, and PLA. A dural tear occurred during the surgery, and a piece of DuraGen was placed over the defect, and the patient was kept on bedrest for 72 hours postoperatively. Subsequently she was mobilized, and has been ambulating in the halls. She is using a rolling walker, and has one at home. She has requested a 3 and 1, and the staff are making the arrangements for that. Her wound is healing nicely, and I've recommended she return on August 17 or 18th for suture removal with Dr. Christella Noa. She's been given instructions regarding wound care and activities following discharge.  Discharge Exam: Blood pressure 132/64, pulse 82, temperature 98.5 F (36.9 C), temperature source Oral, resp. rate 18, height 5\' 6"  (1.676 m), weight 80 kg (176 lb 5.9 oz), SpO2 100.00%.  Disposition: home     Medication List         ALPRAZolam 0.5 MG tablet  Commonly known as:  XANAX  TAKE (1) TABLET BY MOUTH EVERY SIX HOURS AS NEEDED.     cetirizine 10 MG tablet  Commonly known as:  ZYRTEC  Take 10 mg by mouth daily.     fluticasone 50 MCG/ACT nasal spray  Commonly known as:  FLONASE  Place 2 sprays into both nostrils daily.     gabapentin 100 MG capsule  Commonly known as:  NEURONTIN  Take 1 capsule (100 mg  total) by mouth 3 (three) times daily.     methocarbamol 500 MG tablet  Commonly known as:  ROBAXIN  Take 500 mg by mouth 3 (three) times daily as needed for muscle spasms.     naproxen 500 MG tablet  Commonly known as:  NAPROSYN  Take 1 tablet (500 mg total) by mouth 2 (two) times daily with a meal.     oxyCODONE-acetaminophen 5-325 MG per tablet  Commonly known as:  PERCOCET/ROXICET  Take 1-2 tablets by mouth every 4 (four) hours as needed for moderate pain.     traMADol 50 MG tablet  Commonly known as:  ULTRAM  TAKE ONE TABLET BY MOUTH EVERY 6 HOURS AS NEEDED.     verapamil 180 MG CR tablet  Commonly known as:  CALAN-SR  Take 1 tablet (180 mg total) by mouth daily.     zolpidem 10 MG tablet  Commonly known as:  AMBIEN  Take 10 mg by mouth at bedtime as needed for sleep.         Signed: Hosie Spangle, MD 04/01/2014, 8:16 AM

## 2014-04-01 NOTE — Care Management Note (Addendum)
  Page 1 of 1   04/01/2014     10:03:25 AM CARE MANAGEMENT NOTE 04/01/2014  Patient:  Laura Scott, Laura Scott   Account Number:  [id number]  Date Initiated:  03/31/2014  Documentation initiated by:  Lorne Skeens  Subjective/Objective Assessment:   Patient was admitted for PLIF. Lives at home with niece.     Action/Plan:   Will follow for discharge needs pending PT/OT evals and physician orders.   Anticipated DC Date:  04/01/2014   Anticipated DC Plan:  [place]  CM consult      Choice offered to / List presented to:     DME arranged  3-N-1      DME agency  [facility].        Status of service:  Completed, signed off Medicare Important Message given?   (If response is "NO", the following Medicare IM given date fields will be blank) Date Medicare IM given:   Medicare IM given by:   Date Additional Medicare IM given:   Additional Medicare IM given by:    Discharge Disposition:  HOME/SELF CARE  Per UR Regulation:  Reviewed for med. necessity/level of care/duration of stay  If discussed at [place] of Stay Meetings, dates discussed:    Comments:  04/01/14 [place] RN, MSN, CM- Per conversation with RN and Dr Adline Mango discharge note, patient's only home health need is a 3N1. [facility] was notified of need for equipment for discharge home today.

## 2014-04-01 NOTE — Progress Notes (Signed)
Discharge orders received.  Discharge instructions and follow-up information reviewed with the patient.  VSS upon discharge, education complete.  Transported out via wheelchair. Cori Razor, RN 04/01/2014 10:54 AM

## 2014-04-01 NOTE — Progress Notes (Signed)
Physical Therapy Treatment Patient Details Name: Laura Scott MRN: [id number] DOB: [birth year] Today's Date: 04/01/2014    History of Present Illness Patient is a 53 yo female admitted 03/26/14 with back and leg pain.  Patient now s/p L4-5 PLIF.  Patient on bedrest following surgery for 72 hours - now with OOB orders.  PMH:  28-months of back and LE pain/numbness, change in bowel habits; HTN, arthritis, memory problems, anxiety    PT Comments    Pt was seen for final visit per nsg with pt demonstrating much more comfort of movement and less pain with mobility.  Able to reinforce family correcting her body mechanics, which pt agrees she needs to allow.  Sister is going to be primary caregiver and was able to hear instructions to pt reminding about precautions, and will call if any later questions occur.  Follow Up Recommendations  Home health PT;Supervision/Assistance - 24 hour     Equipment Recommendations  Rolling walker with 5" wheels;3in1 (PT)    Recommendations for Other Services       Precautions / Restrictions Precautions Precautions: Back;Fall Precaution Booklet Issued: Yes (comment) Precaution Comments: Reviewed precautions with pt Restrictions Weight Bearing Restrictions: No    Mobility  Bed Mobility Overal bed mobility: Modified Independent Bed Mobility: Sidelying to Sit Rolling: Modified independent (Device/Increase time);Supervision Sidelying to sit: Modified independent (Device/Increase time)       General bed mobility comments: cues to sit up straight with arms and bed rail used  Transfers Overall transfer level: Modified independent Equipment used: Rolling walker (2 wheeled) Transfers: Sit to/from Stand Sit to Stand: Supervision         General transfer comment: cues for hand placement/technique.  Ambulation/Gait Ambulation/Gait assistance: Supervision Ambulation Distance (Feet): 250 Feet Assistive device: Rolling walker (2 wheeled) Gait  Pattern/deviations: Step-through pattern;Wide base of support;Antalgic Gait velocity: slow but faster than yesterday Gait velocity interpretation: Below normal speed for age/gender General Gait Details: Cues for use of walker on hallway with pacing and postural control   Stairs Stairs: Yes Stairs assistance: Supervision Stair Management: One rail Left;Alternating pattern Number of Stairs: 10 General stair comments: faster pace with increasing control of the steps, more confident  Wheelchair Mobility    Modified Rankin (Stroke Patients Only)       Balance Overall balance assessment: Modified Independent Sitting-balance support: Feet supported Sitting balance-Leahy Scale: Good Sitting balance - Comments: back posture guarded in recliner with pillows   Standing balance support: Bilateral upper extremity supported Standing balance-Leahy Scale: Fair                      Cognition Arousal/Alertness: Awake/alert Behavior During Therapy: WFL for tasks assessed/performed Overall Cognitive Status: Within Functional Limits for tasks assessed                      Exercises      General Comments General comments (skin integrity, edema, etc.): Sister in for discharge instructions and to ask questions from staff, very interested in pt's care      Pertinent Vitals/Pain Pain Assessment: 0-10 Pain Score: 6  Pain Location: With gait in buttocks, at rest 2-3 in same location Pain Descriptors / Indicators: Aching Pain Intervention(s): Limited activity within patient's tolerance;Premedicated before session BP was 132/64, pulse 82 and O2 sat 100% per nsg.    Home Living                      Prior Function  PT Goals (current goals can now be found in the care plan section) Acute Rehab PT Goals Patient Stated Goal: Ready to go home today Progress towards PT goals: Progressing toward goals    Frequency  Min 5X/week    PT Plan Current plan  remains appropriate    Co-evaluation             End of Session   Activity Tolerance: Patient tolerated treatment well Patient left: in chair;with call bell/phone within reach;with family/visitor present     Time: 5701-7793 PT Time Calculation (min): 30 min  Charges:  $Gait Training: 8-22 mins $Therapeutic Activity: 8-22 mins                    G Codes:      Ramond Dial Apr 20, 2014, 8:56 AM  Mee Hives, PT MS Acute Rehab Dept. Number: [phone]

## 2014-04-01 NOTE — Progress Notes (Signed)
Occupational Therapy Treatment Patient Details Name: Laura Scott MRN: [id number] DOB: 08-04-1961 Today's Date: 04/01/2014    History of present illness Patient is a 53 yo female admitted 03/26/14 with back and leg pain.  Patient now s/p L4-5 PLIF.  Patient on bedrest following surgery for 72 hours - now with OOB orders.  PMH:  21-months of back and LE pain/numbness, change in bowel habits; HTN, arthritis, memory problems, anxiety   OT comments  Patient received seated in recliner. Educated patient on importance of adhering to back precautions during ADL & IADL tasks. Also educated patient on importance of using RW for functional mobility throughout house and not furniture walking. Patient performed functional mobility, toilet transfer, and toileting tasks at an overall mod I level. Patient eager to discharge today and plans to discharge > home with sister. Patient overall mod I with functional tasks and functional mobility.   Follow Up Recommendations  No OT follow up    Equipment Recommendations  3 in 1 bedside comode    Recommendations for Other Services      Precautions / Restrictions Precautions Precautions: Back;Fall Precaution Booklet Issued: Yes (comment) Precaution Comments: Reviewed back precautions with patient in functional setting, discussing ADL and IADL tasks Restrictions Weight Bearing Restrictions: No       Mobility Bed Mobility - Not assessed, patient in chair   Transfers Overall transfer level: Modified independent Equipment used: Rolling walker (2 wheeled) (and 3-in-1 for over toilet seat and discussed use in tub) Transfers: Sit to/from Stand Sit to Stand: Supervision         General transfer comment: cues for hand placement/technique.    Balance Overall balance assessment: Modified Independent Sitting-balance support: Feet supported Sitting balance-Leahy Scale: Good Sitting balance - Comments: back posture guarded in recliner with pillows    Standing balance support: Bilateral upper extremity supported Standing balance-Leahy Scale: Fair                     ADL Overall ADL's : Modified independent Eating/Feeding: Independent                   Lower Body Dressing: Modified independent Lower Body Dressing Details (indicate cue type and reason): Patient able to cross feet for LB dressing.  Toilet Transfer: Modified Programmer, applications Details (indicate cue type and reason): Patient transferred onto [facility] seated over toilet seat at mod I level. Toileting- Clothing Manipulation and Hygiene: Modified independent     Clinical cytogeneticist Details (indicate cue type and reason): Discussed tub/shower transfer with patient. Patient plans to use 3-in-1 in tub/shower for safety Functional mobility during ADLs: Modified independent General ADL Comments: Therapist educated patient on importance of back precautions during ADL & IADL tasks. Therapist discussed DME needed for home. OT recommending 3-in-1 for tub/shower unit for safety. Patient states, plan is for her to discharge > home today with supervision prn from sister and niece      Vision                     Perception     Praxis      Cognition   Behavior During Therapy: [facility] for tasks assessed/performed Overall Cognitive Status: Within Functional Limits for tasks assessed                       Extremity/Trunk Assessment               Exercises  Shoulder Instructions       General Comments      Pertinent Vitals/ Pain       Pain Assessment: 0-10 Pain Score: 6  Pain Location: back Pain Descriptors / Indicators: Aching Pain Intervention(s):  (Rn present for medication management after session)  Home Living                                          Prior Functioning/Environment              Frequency Min 2X/week     Progress Toward Goals  OT Goals(current goals can now be found in the care  plan section)  Progress towards OT goals: Progressing toward goals  Acute Rehab OT Goals Patient Stated Goal: Ready to discharge OT Goal Formulation: With patient Time For Goal Achievement: 04/06/14 Potential to Achieve Goals: Good ADL Goals Pt Will Perform Grooming: with modified independence;standing Pt Will Perform Lower Body Bathing: with modified independence;sit to/from stand Pt Will Perform Lower Body Dressing: with modified independence;sit to/from stand Pt Will Transfer to Toilet: with modified independence;ambulating;bedside commode Pt Will Perform Toileting - Clothing Manipulation and hygiene: with modified independence;sit to/from stand Pt Will Perform Tub/Shower Transfer: Tub transfer;with modified independence;ambulating;3 in 1;rolling walker  Plan Discharge plan remains appropriate    Co-evaluation                 End of Session Equipment Utilized During Treatment: Rolling walker   Activity Tolerance Patient tolerated treatment well   Patient Left Other (comment) (On BSC in bathroom, NT in room to check vitals)   Nurse Communication          Time: 6283-6629 OT Time Calculation (min): 14 min  Charges: OT General Charges $OT Visit: 1 Procedure OT Treatments $Self Care/Home Management : 8-22 mins  Laura Scott 04/01/2014, 10:00 AM

## 2014-04-02 ENCOUNTER — Other Ambulatory Visit: Payer: Self-pay | Admitting: Family Medicine

## 2014-05-06 ENCOUNTER — Other Ambulatory Visit: Payer: Self-pay | Admitting: Family Medicine

## 2014-05-06 NOTE — Telephone Encounter (Signed)
Last seen 03/04/14.

## 2014-05-06 NOTE — Telephone Encounter (Signed)
Ok plus 5 ref 

## 2014-05-16 ENCOUNTER — Other Ambulatory Visit: Payer: Self-pay | Admitting: Family Medicine

## 2014-05-16 NOTE — Telephone Encounter (Signed)
Ok plus 2 ref 

## 2014-05-16 NOTE — Telephone Encounter (Signed)
Last seen 03/04/14.

## 2014-05-26 ENCOUNTER — Other Ambulatory Visit: Payer: Self-pay | Admitting: Family Medicine

## 2014-05-26 NOTE — Telephone Encounter (Signed)
Last seen 03/04/14.

## 2014-05-26 NOTE — Telephone Encounter (Signed)
Ok plus 3 monthly ref 

## 2014-05-27 ENCOUNTER — Other Ambulatory Visit: Payer: Self-pay | Admitting: Family Medicine

## 2014-05-27 NOTE — Telephone Encounter (Signed)
Ok plus three monthly ref 

## 2014-07-21 ENCOUNTER — Other Ambulatory Visit: Payer: Self-pay | Admitting: Family Medicine

## 2014-07-21 NOTE — Telephone Encounter (Signed)
Ok times one 

## 2014-07-30 ENCOUNTER — Other Ambulatory Visit: Payer: Self-pay | Admitting: Family Medicine

## 2014-08-06 ENCOUNTER — Other Ambulatory Visit: Payer: Self-pay | Admitting: Family Medicine

## 2014-08-06 DIAGNOSIS — Z1231 Encounter for screening mammogram for malignant neoplasm of breast: Secondary | ICD-10-CM

## 2014-08-28 ENCOUNTER — Ambulatory Visit (HOSPITAL_COMMUNITY)
Admission: RE | Admit: 2014-08-28 | Discharge: 2014-08-28 | Disposition: A | Discharge status: Home / Self Care | Payer: 59 | Source: Ambulatory Visit | Attending: Family Medicine | Admitting: Family Medicine

## 2014-08-28 DIAGNOSIS — Z1231 Encounter for screening mammogram for malignant neoplasm of breast: Secondary | ICD-10-CM | POA: Insufficient documentation

## 2014-09-02 ENCOUNTER — Other Ambulatory Visit: Payer: Self-pay | Admitting: Family Medicine

## 2014-09-09 ENCOUNTER — Encounter: Payer: Self-pay | Admitting: Family Medicine

## 2014-09-09 ENCOUNTER — Ambulatory Visit (INDEPENDENT_AMBULATORY_CARE_PROVIDER_SITE_OTHER): Payer: 59 | Admitting: Family Medicine

## 2014-09-09 VITALS — BP 160/80 | Ht 66.0 in | Wt 171.4 lb

## 2014-09-09 DIAGNOSIS — M549 Dorsalgia, unspecified: Secondary | ICD-10-CM

## 2014-09-09 DIAGNOSIS — R030 Elevated blood-pressure reading, without diagnosis of hypertension: Secondary | ICD-10-CM

## 2014-09-09 DIAGNOSIS — R7301 Impaired fasting glucose: Secondary | ICD-10-CM

## 2014-09-09 DIAGNOSIS — I10 Essential (primary) hypertension: Secondary | ICD-10-CM

## 2014-09-09 DIAGNOSIS — Z79899 Other long term (current) drug therapy: Secondary | ICD-10-CM

## 2014-09-09 DIAGNOSIS — G8929 Other chronic pain: Secondary | ICD-10-CM

## 2014-09-09 DIAGNOSIS — IMO0001 Reserved for inherently not codable concepts without codable children: Secondary | ICD-10-CM

## 2014-09-09 MED ORDER — ALPRAZOLAM 0.5 MG PO TABS: ORAL_TABLET | ORAL | Status: DC

## 2014-09-09 MED ORDER — TRAMADOL HCL 50 MG PO TABS: 50.0000 mg | ORAL_TABLET | Freq: Three times a day (TID) | ORAL | Status: DC | PRN

## 2014-09-09 MED ORDER — GABAPENTIN 100 MG PO CAPS: 100.0000 mg | ORAL_CAPSULE | Freq: Two times a day (BID) | ORAL | Status: DC

## 2014-09-09 MED ORDER — PANTOPRAZOLE SODIUM 40 MG PO TBEC: 40.0000 mg | DELAYED_RELEASE_TABLET | Freq: Every day | ORAL | Status: DC

## 2014-09-09 MED ORDER — VERAPAMIL HCL ER 240 MG PO TBCR: 240.0000 mg | EXTENDED_RELEASE_TABLET | Freq: Every day | ORAL | Status: DC

## 2014-09-09 MED ORDER — NAPROXEN 500 MG PO TABS: 500.0000 mg | ORAL_TABLET | Freq: Two times a day (BID) | ORAL | Status: DC

## 2014-09-09 NOTE — Progress Notes (Signed)
   Subjective:    Patient ID: Laura Scott, female    DOB: June 12, 1961, 54 y.o.   MRN: [id number]  Hypertension This is a chronic problem. The current episode started more than 1 year ago. The problem has been gradually improving since onset. The problem is controlled. There are no associated agents to hypertension. There are no known risk factors for coronary artery disease. Treatments tried: Verapamil. The current treatment provides significant improvement. There are no compliance problems.    Patient states that she has hot flashes she would like to talk to the doctor about and she has been dealing with some back pain. (Patient sees a back specialist for her back pain also.)  Patient arrives with numerous concerns. She was out of work nearly 5 months with neurosurgery for spinal stenosis. MRI revealed this along with substantial degenerative changes. Now continue 6 brings ongoing low back pain. Has gone back to work. Tramadol helps, does not cause drowsiness, and patient notes that she needs more than just one per day. Also taking twice per day Neurontin and Naprosyn. Naprosyn in turn has led to epigastric discomfort and burning at times. But patient feels she needs it.  Blood pressures elevated when checked elsewhere Review of Systems No chest pain no headache no change in bowel habits no blood in stool no rash ROS otherwise negative    Objective:   Physical Exam  152/90 on repeat alert HEENT normal lungs clear. Heart regular in rhythm. Low back tender to palpation.      Assessment & Plan:  Impression 1 hypertension suboptimal discussed at length #2 hyperlipidemia. #3 impaired fasting glucose status uncertain. #4 chronic back pain fairly severe. Ongoing. Part of this may be chronic arthritis in patient may well have to live with an element of it. Discussed at length. Plan add tramadol up to 3 times a day. Side effects discussed. Increase verapamil. Appropriate blood work. Other  medications refilled. Recheck in several months. WSL

## 2014-09-10 LAB — BASIC METABOLIC PANEL
BUN: 25 mg/dL — ABNORMAL HIGH (ref 6–23)
CO2: 28 mEq/L (ref 19–32)
Calcium: 9.5 mg/dL (ref 8.4–10.5)
Chloride: 101 mEq/L (ref 96–112)
Creat: 0.77 mg/dL (ref 0.50–1.10)
Glucose, Bld: 98 mg/dL (ref 70–99)
Potassium: 4.2 mEq/L (ref 3.5–5.3)
Sodium: 138 mEq/L (ref 135–145)

## 2014-09-10 LAB — HEPATIC FUNCTION PANEL
ALT: 26 U/L (ref 0–35)
AST: 22 U/L (ref 0–37)
Albumin: 4.4 g/dL (ref 3.5–5.2)
Alkaline Phosphatase: 77 U/L (ref 39–117)
Bilirubin, Direct: 0.1 mg/dL (ref 0.0–0.3)
Indirect Bilirubin: 0.5 mg/dL (ref 0.2–1.2)
Total Bilirubin: 0.6 mg/dL (ref 0.2–1.2)
Total Protein: 7.4 g/dL (ref 6.0–8.3)

## 2014-09-10 LAB — LIPID PANEL
Cholesterol: 205 mg/dL — ABNORMAL HIGH (ref 0–200)
HDL: 52 mg/dL (ref >39)
LDL Cholesterol: 128 mg/dL — ABNORMAL HIGH (ref 0–99)
Total CHOL/HDL Ratio: 3.9 Ratio
Triglycerides: 127 mg/dL (ref <150)
VLDL: 25 mg/dL (ref 0–40)

## 2014-09-15 ENCOUNTER — Encounter: Payer: Self-pay | Admitting: Family Medicine

## 2014-11-03 ENCOUNTER — Other Ambulatory Visit: Payer: Self-pay | Admitting: Family Medicine

## 2014-11-20 ENCOUNTER — Other Ambulatory Visit: Payer: Self-pay | Admitting: Family Medicine

## 2014-12-01 ENCOUNTER — Other Ambulatory Visit: Payer: Self-pay | Admitting: Family Medicine

## 2014-12-02 NOTE — Telephone Encounter (Signed)
Ok plus 2 monthly ref 

## 2015-01-21 ENCOUNTER — Other Ambulatory Visit: Payer: Self-pay | Admitting: Neurosurgery

## 2015-01-21 DIAGNOSIS — M4727 Other spondylosis with radiculopathy, lumbosacral region: Secondary | ICD-10-CM

## 2015-02-02 ENCOUNTER — Other Ambulatory Visit: Payer: Self-pay | Admitting: Family Medicine

## 2015-02-02 NOTE — Telephone Encounter (Signed)
Needs office visit.

## 2015-02-03 ENCOUNTER — Ambulatory Visit
Admission: RE | Admit: 2015-02-03 | Discharge: 2015-02-03 | Disposition: A | Discharge status: Home / Self Care | Payer: 59 | Source: Ambulatory Visit | Attending: Neurosurgery | Admitting: Neurosurgery

## 2015-02-03 DIAGNOSIS — M4727 Other spondylosis with radiculopathy, lumbosacral region: Secondary | ICD-10-CM

## 2015-02-03 MED ORDER — GADOBENATE DIMEGLUMINE 529 MG/ML IV SOLN
16.0000 mL | Freq: Once | INTRAVENOUS | Status: AC | PRN
  Administered 2015-02-03: 16 mL via INTRAVENOUS

## 2015-02-26 ENCOUNTER — Other Ambulatory Visit: Payer: Self-pay | Admitting: Family Medicine

## 2015-02-26 NOTE — Telephone Encounter (Signed)
Ok plus one ref 

## 2015-03-02 ENCOUNTER — Other Ambulatory Visit: Payer: Self-pay | Admitting: Family Medicine

## 2015-03-03 NOTE — Telephone Encounter (Signed)
Ok times one mo needs o v

## 2015-03-13 ENCOUNTER — Other Ambulatory Visit: Payer: Self-pay | Admitting: Family Medicine

## 2015-03-13 NOTE — Telephone Encounter (Signed)
Needs office visit.

## 2015-03-17 ENCOUNTER — Ambulatory Visit (INDEPENDENT_AMBULATORY_CARE_PROVIDER_SITE_OTHER): Payer: 59 | Admitting: Obstetrics and Gynecology

## 2015-03-17 ENCOUNTER — Encounter: Payer: Self-pay | Admitting: Obstetrics and Gynecology

## 2015-03-17 VITALS — BP 160/78 | Ht 66.0 in | Wt 166.0 lb

## 2015-03-17 DIAGNOSIS — IMO0002 Reserved for concepts with insufficient information to code with codable children: Secondary | ICD-10-CM

## 2015-03-17 DIAGNOSIS — Z01419 Encounter for gynecological examination (general) (routine) without abnormal findings: Secondary | ICD-10-CM | POA: Diagnosis not present

## 2015-03-17 DIAGNOSIS — Z0000 Encounter for general adult medical examination without abnormal findings: Secondary | ICD-10-CM

## 2015-03-17 MED ORDER — ESTRADIOL 0.1 MG/24HR TD PTTW: 1.0000 | MEDICATED_PATCH | TRANSDERMAL | Status: DC

## 2015-03-17 MED ORDER — PROGESTERONE MICRONIZED 200 MG PO CAPS: 200.0000 mg | ORAL_CAPSULE | Freq: Every day | ORAL | Status: DC

## 2015-03-17 NOTE — Progress Notes (Signed)
Patient ID: Laura Scott, female   DOB: [birth year], 54 y.o.   MRN: [id number] Pt here today for annual exam. Pt states that she is having a little vaginal irritation and pain with intercourse. Pt states that she has had these symptoms for about a month.

## 2015-03-17 NOTE — Patient Instructions (Signed)
You may try Cornhuskers lotion, Gyne-Moistrin, Femm Ease and Luvena as a sexual lubricant. The most popular one recommended to me is the Cornhuskers. It is slippery without that sticky after use feeling. °

## 2015-03-17 NOTE — Progress Notes (Signed)
Patient ID: Laura Scott, female   DOB: 10-10-1960, 54 y.o.   MRN: [id number]  Assessment:  Annual Gyn Exam Dyspareunia, with vulvar irritation    Plan:  1. pap smear  Not done, next pap due 2 ty 2. return annually or prn 3    Annual mammogram advised Subjective:  Laura Scott is a 54 y.o. female No obstetric history on file. who presents for annual exam. No LMP recorded. Patient is not currently having periods (Reason: Perimenopausal). The patient has complaints today of dyspareunia, with vulvar irritation  The following portions of the patient's history were reviewed and updated as appropriate: allergies, current medications, past family history, past medical history, past social history, past surgical history and problem list. Past Medical History  Diagnosis Date  . Hyperlipidemia   . Reflux   . PMS (premenstrual syndrome)   . IBS (irritable bowel syndrome)   . Allergic rhinitis   . Chronic pain   . Depression     Past Surgical History  Procedure Laterality Date  . Colonoscopy  08/29/2011    Procedure: COLONOSCOPY;  Surgeon: Daneil Dolin, MD;  Location: [facility];  Service: Endoscopy;  Laterality: N/A;  9:15 AM  . Lumbar disc surgery       Current outpatient prescriptions:  .  ALPRAZolam (XANAX) 0.5 MG tablet, TAKE (1) TABLET BY MOUTH EVERY SIX HOURS AS NEEDED. MAY FILL MONTHLY PER MD., Disp: 40 tablet, Rfl: 1 .  cetirizine (ZYRTEC) 10 MG tablet, Take 10 mg by mouth daily., Disp: , Rfl:  .  fluticasone (FLONASE) 50 MCG/ACT nasal spray, INSTILL 2 SPRAYS INTO EACH NOSTRIL DAILY., Disp: 16 g, Rfl: 5 .  gabapentin (NEURONTIN) 100 MG capsule, Take 1 capsule (100 mg total) by mouth 2 (two) times daily., Disp: 180 capsule, Rfl: 3 .  naproxen (NAPROSYN) 500 MG tablet, Take 1 tablet (500 mg total) by mouth 2 (two) times daily with a meal., Disp: 180 tablet, Rfl: 3 .  pantoprazole (PROTONIX) 40 MG tablet, Take 1 tablet (40 mg total) by mouth daily., Disp: 90 tablet, Rfl: 3 .   traMADol (ULTRAM) 50 MG tablet, TAKE ONE TABLET BY MOUTH THREE TIMES DAILY AS NEEDED., Disp: 90 tablet, Rfl: 0 .  verapamil (CALAN-SR) 240 MG CR tablet, Take 1 tablet by mouth at  bedtime, Disp: 30 tablet, Rfl: 0 .  zolpidem (AMBIEN) 10 MG tablet, TAKE (1) TABLET BY MOUTH AT BEDTIME AS NEEDED., Disp: 30 tablet, Rfl: 5  Review of Systems Constitutional: negative Gastrointestinal: negative Genitourinary: neg x constipaiton controlled with stool softeners  Objective:  BP 160/78 mmHg  Ht 5\' 6"  (1.676 m)  Wt 166 lb (75.297 kg)  BMI 26.81 kg/m2   BMI: Body mass index is 26.81 kg/(m^2).  General Appearance: Alert, appropriate appearance for age. No acute distress HEENT: Grossly normal Neck / Thyroid:  Cardiovascular: RRR; normal S1, S2, no murmur Lungs: CTA bilaterally Back: No CVAT Breast Exam: No dimpling, nipple retraction or discharge. No masses or nodes., Normal to inspection, Normal breast tissue bilaterally and No masses or nodes.No dimpling, nipple retraction or discharge. Gastrointestinal: Soft, non-tender, no masses or organomegaly Pelvic Exam: Vulva and vagina appear normal. Bimanual exam reveals normal uterus and adnexa. External genitalia: normal general appearance Vaginal: normal rugae and postcoital tear at 6 oclock Cervix: normal appearance Uterus: normal single, nontender Clinical staff offered to be present for exam: yes  Initials: gottshcalk Rectovaginal: normal rectal, no masses Lymphatic Exam: Non-palpable nodes in neck, clavicular, axillary, or inguinal regions Skin:  no rash or abnormalities Neurologic: Normal gait and speech, no tremor  Psychiatric: Alert and oriented, appropriate affect.  Urinalysis:hematuria to a large degree and Not done  [name]. MD Pgr [phone] 4:01 PM

## 2015-04-02 ENCOUNTER — Other Ambulatory Visit: Payer: Self-pay | Admitting: Family Medicine

## 2015-04-02 NOTE — Telephone Encounter (Signed)
One of each send card for OV

## 2015-04-22 ENCOUNTER — Telehealth: Payer: Self-pay | Admitting: Family Medicine

## 2015-04-22 MED ORDER — VERAPAMIL HCL ER 240 MG PO TBCR: EXTENDED_RELEASE_TABLET | ORAL | Status: DC

## 2015-04-22 NOTE — Telephone Encounter (Signed)
Patient needs refill on verapamil 240 mg cr tablets completely out .She has appointment for med check 9/12 with Hoyle Sauer. Call in [employer]

## 2015-04-22 NOTE — Telephone Encounter (Signed)
Informed patient that refill was sent into pharmacy and to keep follow up appointment on 05/08/15 with [facility]

## 2015-05-08 ENCOUNTER — Encounter: Payer: Self-pay | Admitting: Nurse Practitioner

## 2015-05-08 ENCOUNTER — Ambulatory Visit (INDEPENDENT_AMBULATORY_CARE_PROVIDER_SITE_OTHER): Payer: 59 | Admitting: Nurse Practitioner

## 2015-05-08 VITALS — BP 140/86 | Ht 66.0 in | Wt 172.8 lb

## 2015-05-08 DIAGNOSIS — R0789 Other chest pain: Secondary | ICD-10-CM

## 2015-05-08 DIAGNOSIS — I10 Essential (primary) hypertension: Secondary | ICD-10-CM | POA: Diagnosis not present

## 2015-05-08 DIAGNOSIS — M549 Dorsalgia, unspecified: Secondary | ICD-10-CM | POA: Diagnosis not present

## 2015-05-08 DIAGNOSIS — G8929 Other chronic pain: Secondary | ICD-10-CM

## 2015-05-08 DIAGNOSIS — M1990 Unspecified osteoarthritis, unspecified site: Secondary | ICD-10-CM

## 2015-05-08 DIAGNOSIS — K219 Gastro-esophageal reflux disease without esophagitis: Secondary | ICD-10-CM | POA: Diagnosis not present

## 2015-05-08 DIAGNOSIS — IMO0001 Reserved for inherently not codable concepts without codable children: Secondary | ICD-10-CM

## 2015-05-08 MED ORDER — GABAPENTIN 100 MG PO CAPS: 100.0000 mg | ORAL_CAPSULE | Freq: Two times a day (BID) | ORAL | Status: DC

## 2015-05-08 MED ORDER — VERAPAMIL HCL ER 240 MG PO TBCR: EXTENDED_RELEASE_TABLET | ORAL | Status: DC

## 2015-05-08 MED ORDER — TRAMADOL HCL 50 MG PO TABS: ORAL_TABLET | ORAL | Status: DC

## 2015-05-08 MED ORDER — ALPRAZOLAM 0.5 MG PO TABS: ORAL_TABLET | ORAL | Status: DC

## 2015-05-08 MED ORDER — RANITIDINE HCL 300 MG PO TABS: 300.0000 mg | ORAL_TABLET | Freq: Every day | ORAL | Status: DC

## 2015-05-08 MED ORDER — ZOLPIDEM TARTRATE 10 MG PO TABS: ORAL_TABLET | ORAL | Status: DC

## 2015-05-08 NOTE — Patient Instructions (Signed)
Align as directed. Stop Protonix  Food Choices for Gastroesophageal Reflux Disease When you have gastroesophageal reflux disease (GERD), the foods you eat and your eating habits are very important. Choosing the right foods can help ease the discomfort of GERD. WHAT GENERAL GUIDELINES DO I NEED TO FOLLOW?  Choose fruits, vegetables, whole grains, low-fat dairy products, and low-fat meat, fish, and poultry.  Limit fats such as oils, salad dressings, butter, nuts, and avocado.  Keep a food diary to identify foods that cause symptoms.  Avoid foods that cause reflux. These may be different for different people.  Eat frequent small meals instead of three large meals each day.  Eat your meals slowly, in a relaxed setting.  Limit fried foods.  Cook foods using methods other than frying.  Avoid drinking alcohol.  Avoid drinking large amounts of liquids with your meals.  Avoid bending over or lying down until 2-3 hours after eating. WHAT FOODS ARE NOT RECOMMENDED? The following are some foods and drinks that may worsen your symptoms: Vegetables Tomatoes. Tomato juice. Tomato and spaghetti sauce. Chili peppers. Onion and garlic. Horseradish. Fruits Oranges, grapefruit, and lemon (fruit and juice). Meats High-fat meats, fish, and poultry. This includes hot dogs, ribs, ham, sausage, salami, and bacon. Dairy Whole milk and chocolate milk. Sour cream. Cream. Butter. Ice cream. Cream cheese.  Beverages Coffee and tea, with or without caffeine. Carbonated beverages or energy drinks. Condiments Hot sauce. Barbecue sauce.  Sweets/Desserts Chocolate and cocoa. Donuts. Peppermint and spearmint. Fats and Oils High-fat foods, including [place] fries and potato chips. Other Vinegar. Strong spices, such as black pepper, white pepper, red pepper, cayenne, curry powder, cloves, ginger, and chili powder. The items listed above may not be a complete list of foods and beverages to avoid. Contact  your dietitian for more information. Document Released: 08/08/2005 Document Revised: 08/13/2013 Document Reviewed: 06/12/2013 [facility] Patient Information 2015 [place], [place]. This information is not intended to replace advice given to you by your health care provider. Make sure you discuss any questions you have with your health care provider.

## 2015-05-11 ENCOUNTER — Encounter: Payer: Self-pay | Admitting: Nurse Practitioner

## 2015-05-11 DIAGNOSIS — F32A Depression, unspecified: Secondary | ICD-10-CM | POA: Insufficient documentation

## 2015-05-11 DIAGNOSIS — F329 Major depressive disorder, single episode, unspecified: Secondary | ICD-10-CM | POA: Insufficient documentation

## 2015-05-11 DIAGNOSIS — K219 Gastro-esophageal reflux disease without esophagitis: Secondary | ICD-10-CM

## 2015-05-11 DIAGNOSIS — F419 Anxiety disorder, unspecified: Secondary | ICD-10-CM | POA: Insufficient documentation

## 2015-05-11 DIAGNOSIS — I10 Essential (primary) hypertension: Secondary | ICD-10-CM | POA: Insufficient documentation

## 2015-05-11 DIAGNOSIS — IMO0001 Reserved for inherently not codable concepts without codable children: Secondary | ICD-10-CM | POA: Insufficient documentation

## 2015-05-11 NOTE — Progress Notes (Signed)
Subjective:  Presents for recheck on BP. Took her BP pill about an hour ago. No SOB. Minimal edema mainly when working. Off/on mid chest discomfort. Had about 12 hours of discomfort 3 days ago. Began after eating some onion rings. No RUQ abd pain or upper back pain. Eased up some after taking antacids such as TUMS. Unassociated with activity. History of GERD. Gagging easily. Regurgitation at times. Feels like food "gets hung" near the base of the throat. No abdominal pain. No cough or fever. Alternating constipation and loose stools. No blood or change in color. Has had a colonoscopy. Nonsmoker. Drinks caffeine. Drinks occasional beer. No N/V. Taking Naprosyn 500 mg 2-3 x per day for arthritis and back pain. Only takes Protonix 3 x per month.   Objective:   BP 140/86 mmHg  Ht 5\' 6"  (1.676 m)  Wt 172 lb 12.8 oz (78.382 kg)  BMI 27.90 kg/m2 NAD. Alert, oriented. Lungs clear. Heart RRR. Abdomen soft, non distended, non tender. EKG normal. Lower extremities no edema.   Assessment:  Problem List Items Addressed This Visit      Cardiovascular and Mediastinum   Essential hypertension - Primary   Relevant Medications   verapamil (CALAN-SR) 240 MG CR tablet     Musculoskeletal and Integument   Arthritis   Relevant Medications   traMADol (ULTRAM) 50 MG tablet     Other   Chronic back pain   Relevant Medications   traMADol (ULTRAM) 50 MG tablet   Reflux    Other Visit Diagnoses    Other chest pain           Plan:  Meds ordered this encounter  Medications  . ALPRAZolam (XANAX) 0.5 MG tablet    Sig: TAKE (1) TABLET BY MOUTH EVERY SIX HOURS AS NEEDED. MAY FILL MONTHLY PER MD.    Dispense:  40 tablet    Refill:  5    May refill monthly    Order Specific Question:  Supervising Provider    Answer:  Mikey Kirschner [[id number]]  . gabapentin (NEURONTIN) 100 MG capsule    Sig: Take 1 capsule (100 mg total) by mouth 2 (two) times daily.    Dispense:  180 capsule    Refill:  3    Order Specific  Question:  Supervising Provider    Answer:  Mikey Kirschner [[id number]]  . traMADol (ULTRAM) 50 MG tablet    Sig: TAKE ONE TABLET BY MOUTH THREE TIMES DAILY AS NEEDED.    Dispense:  90 tablet    Refill:  5    May refill monthly    Order Specific Question:  Supervising Provider    Answer:  Mikey Kirschner [[id number]]  . verapamil (CALAN-SR) 240 MG CR tablet    Sig: Take 1 tablet by mouth at  bedtime    Dispense:  90 tablet    Refill:  1    Order Specific Question:  Supervising Provider    Answer:  Mikey Kirschner [[id number]]  . zolpidem (AMBIEN) 10 MG tablet    Sig: TAKE (1) TABLET BY MOUTH AT BEDTIME AS NEEDED.    Dispense:  30 tablet    Refill:  5    Order Specific Question:  Supervising Provider    Answer:  Mikey Kirschner [[id number]]  . ranitidine (ZANTAC) 300 MG tablet    Sig: Take 1 tablet (300 mg total) by mouth at bedtime. Prn acid reflux    Dispense:  90 tablet    Refill:  1    Order Specific Question:  Supervising Provider    Answer:  Mikey Kirschner [[id number]]   Reminded patient not to take Xanax with Ambien. Takes Xanax only during the day and not within 6 hours of taking Ambien. Cut back on Naprosyn use; use no more than BID and try to use daily if possible. Cut back on caffeine use. Discussed dietary measures for GERD. Gets physicals with Dr. Glo Herring. Encouraged regular activity and stress reduction.  Return in about 6 months (around 11/05/2015) for recheck and labs.

## 2015-05-12 ENCOUNTER — Other Ambulatory Visit: Payer: Self-pay | Admitting: *Deleted

## 2015-05-12 MED ORDER — FLUTICASONE PROPIONATE 50 MCG/ACT NA SUSP: NASAL | Status: DC

## 2015-05-19 ENCOUNTER — Encounter: Payer: Self-pay | Admitting: Family Medicine

## 2015-05-25 ENCOUNTER — Other Ambulatory Visit: Payer: Self-pay | Admitting: Nurse Practitioner

## 2015-05-25 ENCOUNTER — Other Ambulatory Visit: Payer: Self-pay | Admitting: *Deleted

## 2015-05-25 ENCOUNTER — Telehealth: Payer: Self-pay | Admitting: *Deleted

## 2015-05-25 MED ORDER — HYDROCHLOROTHIAZIDE 25 MG PO TABS: 25.0000 mg | ORAL_TABLET | Freq: Every day | ORAL | Status: DC

## 2015-05-25 NOTE — Telephone Encounter (Signed)
Pt states her feet are swollen and she was suppose to get a fluid pill when she was seen. Med was not sent in. Can it be sent to [place].

## 2015-05-25 NOTE — Telephone Encounter (Signed)
Pt.notified

## 2015-05-25 NOTE — Telephone Encounter (Signed)
done

## 2015-06-09 ENCOUNTER — Telehealth: Payer: Self-pay | Admitting: Nurse Practitioner

## 2015-06-09 NOTE — Telephone Encounter (Signed)
Pt states she was put on a BP and fluid pill in the last couple of weeks,  She states that when she takes the pill she is having swelling in her knees  An ankles, feet. Cramps like a charlie horse   She wonders if she needs to add a potassium pill? Or what would you  Advise at this point  Laura Scott

## 2015-06-10 NOTE — Telephone Encounter (Signed)
Stop HCTZ (fluid pill). Call back next week and let us know if these symptoms resolve or sooner if they worsen.

## 2015-06-10 NOTE — Telephone Encounter (Signed)
Discussed with patient. Patient advised Stop HCTZ (fluid pill). Call back next week and let us know if these symptoms resolve or sooner if they are worse. Patient verbalized understanding.

## 2015-06-24 ENCOUNTER — Telehealth: Payer: Self-pay | Admitting: Nurse Practitioner

## 2015-06-24 DIAGNOSIS — R252 Cramp and spasm: Secondary | ICD-10-CM

## 2015-06-24 NOTE — Telephone Encounter (Signed)
Pt having swelling and cramping in both legs. Pt aware that carolyn is out of office and will be back tomorrow. She is calling for bw orders

## 2015-06-24 NOTE — Telephone Encounter (Signed)
Patient was instructed to call back and request order for blood work.  She says she is having issues with swelling and cramping in her legs.

## 2015-06-25 ENCOUNTER — Other Ambulatory Visit: Payer: Self-pay | Admitting: Nurse Practitioner

## 2015-06-25 NOTE — Telephone Encounter (Signed)
No sob or othopnea. Pt stopped fluid pill because she was having cramps in her leg and she thought potassium might be low

## 2015-06-25 NOTE — Telephone Encounter (Signed)
Is she taking fluid pill? Any SOB or orthopnea?

## 2015-06-25 NOTE — Telephone Encounter (Signed)
Orders ready. Pt notified on vm

## 2015-06-25 NOTE — Telephone Encounter (Signed)
Ok. Let's order met 7 and magnesium level. Diagnosis: leg cramps. Thanks. 

## 2015-07-01 LAB — BASIC METABOLIC PANEL
BUN/Creatinine Ratio: 19 (ref 9–23)
BUN: 15 mg/dL (ref 6–24)
CO2: 25 mmol/L (ref 18–29)
Calcium: 9.4 mg/dL (ref 8.7–10.2)
Chloride: 100 mmol/L (ref 97–106)
Creatinine, Ser: 0.81 mg/dL (ref 0.57–1.00)
GFR calc Af Amer: 95 mL/min/{1.73_m2} (ref >59)
GFR calc non Af Amer: 83 mL/min/{1.73_m2} (ref >59)
Glucose: 90 mg/dL (ref 65–99)
Potassium: 3.8 mmol/L (ref 3.5–5.2)
Sodium: 140 mmol/L (ref 136–144)

## 2015-07-01 LAB — MAGNESIUM: Magnesium: 2.3 mg/dL (ref 1.6–2.3)

## 2015-07-02 NOTE — Telephone Encounter (Signed)
Patient just sent a note about swelling in her legs. Recommend she restart fluid pill. She stopped it thinking it was causing low potassium and leg cramps but it was normal on labs.

## 2015-07-02 NOTE — Telephone Encounter (Signed)
Called patient and informed her per [facility] just sent a note about swelling in her legs. Recommend she restart fluid pill. She stopped it thinking it was causing low potassium and leg cramps but it was normal on labs. Patient verbalized understanding.

## 2015-07-18 ENCOUNTER — Other Ambulatory Visit: Payer: Self-pay | Admitting: Nurse Practitioner

## 2015-08-13 ENCOUNTER — Other Ambulatory Visit: Payer: Self-pay

## 2015-08-13 DIAGNOSIS — Z1231 Encounter for screening mammogram for malignant neoplasm of breast: Secondary | ICD-10-CM

## 2015-09-01 ENCOUNTER — Ambulatory Visit
Admission: RE | Admit: 2015-09-01 | Discharge: 2015-09-01 | Disposition: A | Discharge status: Home / Self Care | Payer: 59 | Source: Ambulatory Visit

## 2015-09-01 DIAGNOSIS — Z1231 Encounter for screening mammogram for malignant neoplasm of breast: Secondary | ICD-10-CM

## 2015-09-08 ENCOUNTER — Other Ambulatory Visit: Payer: Self-pay | Admitting: Nurse Practitioner

## 2015-09-20 ENCOUNTER — Other Ambulatory Visit: Payer: Self-pay | Admitting: Nurse Practitioner

## 2015-09-20 ENCOUNTER — Other Ambulatory Visit: Payer: Self-pay | Admitting: Family Medicine

## 2015-10-15 ENCOUNTER — Encounter: Payer: Self-pay | Admitting: Nurse Practitioner

## 2015-10-15 ENCOUNTER — Ambulatory Visit (INDEPENDENT_AMBULATORY_CARE_PROVIDER_SITE_OTHER): Payer: 59 | Admitting: Nurse Practitioner

## 2015-10-15 VITALS — BP 168/84 | Ht 66.0 in | Wt 181.0 lb

## 2015-10-15 DIAGNOSIS — K219 Gastro-esophageal reflux disease without esophagitis: Secondary | ICD-10-CM

## 2015-10-15 DIAGNOSIS — R7301 Impaired fasting glucose: Secondary | ICD-10-CM | POA: Diagnosis not present

## 2015-10-15 DIAGNOSIS — F419 Anxiety disorder, unspecified: Secondary | ICD-10-CM

## 2015-10-15 DIAGNOSIS — I10 Essential (primary) hypertension: Secondary | ICD-10-CM | POA: Diagnosis not present

## 2015-10-15 DIAGNOSIS — G4700 Insomnia, unspecified: Secondary | ICD-10-CM

## 2015-10-15 DIAGNOSIS — IMO0001 Reserved for inherently not codable concepts without codable children: Secondary | ICD-10-CM

## 2015-10-15 LAB — POCT GLYCOSYLATED HEMOGLOBIN (HGB A1C): Hemoglobin A1C: 5.4

## 2015-10-15 MED ORDER — DIAZEPAM 5 MG PO TABS: ORAL_TABLET | ORAL | Status: DC

## 2015-10-15 MED ORDER — HYDROCHLOROTHIAZIDE 25 MG PO TABS: 25.0000 mg | ORAL_TABLET | Freq: Every day | ORAL | Status: DC

## 2015-10-15 MED ORDER — NAPROXEN 500 MG PO TABS: ORAL_TABLET | ORAL | Status: DC

## 2015-10-15 MED ORDER — VERAPAMIL HCL ER 240 MG PO TBCR: EXTENDED_RELEASE_TABLET | ORAL | Status: DC

## 2015-10-15 MED ORDER — GABAPENTIN 300 MG PO CAPS: 300.0000 mg | ORAL_CAPSULE | Freq: Two times a day (BID) | ORAL | Status: DC

## 2015-10-15 MED ORDER — TRAMADOL HCL 50 MG PO TABS: ORAL_TABLET | ORAL | Status: DC

## 2015-10-15 MED ORDER — ZOLPIDEM TARTRATE 10 MG PO TABS: ORAL_TABLET | ORAL | Status: DC

## 2015-10-15 NOTE — Patient Instructions (Signed)
Use caution with Tramadol, valium and Ambien; also if any pain medicine

## 2015-10-16 ENCOUNTER — Encounter: Payer: Self-pay | Admitting: Nurse Practitioner

## 2015-10-16 NOTE — Progress Notes (Signed)
Subjective:  Presents for routine follow-up. Took her blood pressure pill about 2 hours ago. No chest pain/ischemic type pain or unusual shortness of breath. Has chronic back pain, being followed by a specialist. Has had injections which seem to help temporarily. Has a job requiring repetitive bending and lifting. Financially needs to stay in the same job. Would like to increase her dose of Neurontin to see if this will help. Has occasional extreme anxiety no more than 2-3 times per week. Has been on Xanax 0.5 mg for over 10 years. This does not seem to be helping as much. Has taken 1 dose of Valium which worked much better. States she does not take this medicine daily.occasional mild reflux. Did not start Zantac 300 mg, stated her insurance would not cover it.has also been struggling with her weight. Has not done very well with her diet. Also attributes some limitations on activity due to her back and other orthopedic issues.  Objective:   BP 168/84 mmHg  Ht 5\' 6"  (1.676 m)  Wt 181 lb (82.101 kg)  BMI 29.23 kg/m2 NAD. Alert, oriented. Calm affect. Lungs clear. Heart regular rate rhythm. No murmur or gallop noted. Abdomen soft nondistended nontender. Lower extremities no edema. Results for orders placed or performed in visit on 10/15/15  POCT HgB A1C  Result Value Ref Range   Hemoglobin A1C 5.4      Assessment:  Problem List Items Addressed This Visit      Cardiovascular and Mediastinum   Essential hypertension - Primary   Relevant Medications   hydrochlorothiazide (HYDRODIURIL) 25 MG tablet   verapamil (CALAN-SR) 240 MG CR tablet     Endocrine   Impaired fasting glucose   Relevant Orders   POCT HgB A1C (Completed)     Other   Anxiety   Relevant Medications   diazepam (VALIUM) 5 MG tablet   Insomnia   Reflux     Plan:  Meds ordered this encounter  Medications  . diazepam (VALIUM) 5 MG tablet    Sig: One po qd prn anxiety    Dispense:  16 tablet    Refill:  5    May refill  monthly    Order Specific Question:  Supervising Provider    Answer:  Mikey Kirschner [[id number]]  . hydrochlorothiazide (HYDRODIURIL) 25 MG tablet    Sig: Take 1 tablet (25 mg total) by mouth daily. Prn swelling    Dispense:  90 tablet    Refill:  1    Order Specific Question:  Supervising Provider    Answer:  Mikey Kirschner [[id number]]  . naproxen (NAPROSYN) 500 MG tablet    Sig: Take 1 tablet by mouth  twice a day with a meal prn pain    Dispense:  180 tablet    Refill:  1    Order Specific Question:  Supervising Provider    Answer:  Mikey Kirschner [[id number]]  . verapamil (CALAN-SR) 240 MG CR tablet    Sig: Take 1 tablet by mouth at  bedtime    Dispense:  90 tablet    Refill:  1    Order Specific Question:  Supervising Provider    Answer:  Mikey Kirschner [[id number]]  . zolpidem (AMBIEN) 10 MG tablet    Sig: TAKE (1) TABLET BY MOUTH AT BEDTIME AS NEEDED.    Dispense:  30 tablet    Refill:  5    Order Specific Question:  Supervising Provider    Answer:  Baltazar Apo  S [[id number]]  . gabapentin (NEURONTIN) 300 MG capsule    Sig: Take 1 capsule (300 mg total) by mouth 2 (two) times daily.    Dispense:  180 capsule    Refill:  1    Order Specific Question:  Supervising Provider    Answer:  Mikey Kirschner [[id number]]  . DISCONTD: traMADol (ULTRAM) 50 MG tablet    Sig: TAKE ONE TABLET BY MOUTH THREE TIMES DAILY AS NEEDED. MAY REFILL MONTHLY.    Dispense:  30 tablet    Refill:  [place]    Order Specific Question:  Supervising Provider    Answer:  Mikey Kirschner [[id number]]  . traMADol (ULTRAM) 50 MG tablet    Sig: TAKE ONE TABLET BY MOUTH THREE TIMES DAILY AS NEEDED.    Dispense:  90 tablet    Refill:  0    Order Specific Question:  Supervising Provider    Answer:  Mikey Kirschner [[id number]]   Take OTC ranitidine as directed especially if taking Naprosyn. Increase gabapentin to 300 mg twice a day. Given 1 prescription for tramadol No. 90. This is to last her for 3 months.  Given prescription for Valium, use no more than 3-4 times maximum per week. If patient finds she needs something every day, will need to look at other options. Discussed the addiction potential. Patient advised not to take tramadol, Ambien or Valium at the same time. Separate doses by at least 4 hours. Encouraged healthy diet and weight loss. Continue follow-up with her back specialist. Return in about 3 months (around 01/12/2016) for recheck. Recheck BP and evaluate gabapentin dose at that time. Call back sooner if any problems.

## 2015-11-25 ENCOUNTER — Other Ambulatory Visit: Payer: Self-pay | Admitting: Nurse Practitioner

## 2015-11-27 ENCOUNTER — Other Ambulatory Visit: Payer: Self-pay | Admitting: Nurse Practitioner

## 2015-11-27 NOTE — Telephone Encounter (Signed)
[name]'s note said tramadol needs to last 90 d, lasted only half that, rec f u visit with carolynm

## 2015-12-11 ENCOUNTER — Ambulatory Visit (HOSPITAL_COMMUNITY): Payer: 59 | Attending: Anesthesiology | Admitting: Physical Therapy

## 2015-12-11 DIAGNOSIS — R29898 Other symptoms and signs involving the musculoskeletal system: Secondary | ICD-10-CM | POA: Diagnosis present

## 2015-12-11 DIAGNOSIS — M545 Low back pain: Secondary | ICD-10-CM | POA: Diagnosis present

## 2015-12-11 DIAGNOSIS — M6281 Muscle weakness (generalized): Secondary | ICD-10-CM

## 2015-12-11 NOTE — Patient Instructions (Signed)
Backward Bend (Standing)    Arch backward to make hollow of back deeper. Hold ___2_ seconds. Repeat ___5-10_ times per set. Do __1__ sets per session. Do __4__ sessions per day.  http://orth.exer.us/178   Copyright  VHI. All rights reserved.  Hamstring Stretch: Active    Support behind right knee. Starting with knee bent, attempt to straighten knee until a comfortable stretch is felt in back of thigh. Hold _30___ seconds. Repeat _1___ times per set. Do _1___ sets per session. Do __3__ sessions per day.  http://orth.exer.us/158   Copyright  VHI. All rights reserved.  Knee-to-Chest Stretch: Unilateral    With hand behind right knee, pull knee in to chest until a comfortable stretch is felt in lower back and buttocks. Keep back relaxed. Hold __30__ seconds. Repeat __3__ times per set. Do __1__ sets per session. Do _3___ sessions per day.  http://orth.exer.us/126   Copyright  VHI. All rights reserved.  Isometric Abdominal    Lying on back with knees bent, tighten stomach by pressing elbows down. Hold __3-5__ seconds. Repeat __10__ times per set. Do ___1_ sets per session. Do _4___ sessions per day.  http://orth.exer.us/1086   Bridging    Slowly raise buttocks from floor, keeping stomach tight. Repeat __10__ times per set. Do __1__ sets per session. Do _2___ sessions per day.  http://orth.exer.us/1096   Copyright  VHI. All rights reserved.

## 2015-12-11 NOTE — Therapy (Signed)
[place], [place], [address] Phone: [phone]   Fax:  [phone]  Physical Therapy Evaluation  Patient Details  Name: Laura Scott MRN: [id number] Date of Birth: 1961/06/16 Referring Provider: Dr. Clydell Hakim   Encounter Date: 12/11/2015      PT End of Session - 12/11/15 0949    Visit Number 1   Number of Visits 16   Date for PT Re-Evaluation 01/10/16   Authorization Type [employer]   Authorization - Visit Number 1   Authorization - Number of Visits 16   PT Start Time 0905   PT Stop Time 0942   PT Time Calculation (min) 37 min   Activity Tolerance Patient tolerated treatment well   Behavior During Therapy [facility] for tasks assessed/performed      Past Medical History  Diagnosis Date  . Hyperlipidemia   . Reflux   . PMS (premenstrual syndrome)   . IBS (irritable bowel syndrome)   . Allergic rhinitis   . Chronic pain   . Depression     Past Surgical History  Procedure Laterality Date  . Colonoscopy  08/29/2011    Procedure: COLONOSCOPY;  Surgeon: Daneil Dolin, MD;  Location: [facility];  Service: Endoscopy;  Laterality: N/A;  9:15 AM  . Lumbar disc surgery      There were no vitals filed for this visit.       Subjective Assessment - 12/11/15 0945    Subjective Laura Scott states that she has had chronic Scott pain.  She had a fusion oon L4/5 on 04/05/14. The surgery had helped but she continued to have pain.  She has had approximately 7epidural since the surgery but the last epidural given last week did not help her pain.  She is being referred to skilled physical therapy to assist in her discomfort.    Pertinent History HTN   Limitations Sitting;Lifting;Standing   How long can you sit comfortably? able to sit for 10 minutes and then she has increased pain but she will continue to sit.  Pt wears Scott brace at work.    How long can you stand comfortably? able to stand for 10 minutes before her pain  increases    How long can you walk comfortably? Pt is able to walk for 45 minutes    Patient Stated Goals less pain, strengthen Scott , to be able to complete work activities    Currently in Pain? Yes   Pain Score 7    Pain Location Scott   Pain Orientation Left  varies sometimes it is the right    Pain Descriptors / Indicators Aching   Pain Type Chronic pain   Pain Radiating Towards no radiation    Pain Onset More than a month ago   Pain Frequency Constant   Aggravating Factors  bending    Pain Relieving Factors medication and Scott brace    Effect of Pain on Daily Activities increases   Multiple Pain Sites No            [facility] PT Assessment - 12/11/15 0001    Assessment   Medical Diagnosis Low Scott pain with acute exacerbation    Referring Provider Dr. Clydell Hakim    Onset Date/Surgical Date 03/26/14   Next MD Visit 01/06/2016   Prior Therapy none    Precautions   Precautions Scott   Precaution Comments fusion no traction    Required Braces or Orthoses Other Brace/Splint   Restrictions  Weight Bearing Restrictions No   Balance Screen   Has the patient fallen in the past 6 months No   Has the patient had a decrease in activity level because of a fear of falling?  Yes   Is the patient reluctant to leave their home because of a fear of falling?  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   required to step up onto a 10" stool; lift 10# overhead;   Leisure cooking   Cognition   Overall Cognitive Status Within Functional Limits for tasks assessed   Observation/Other Assessments   Focus on Therapeutic Outcomes (FOTO)  68   Posture/Postural Control   Posture/Postural Control Postural limitations   Postural Limitations Rounded Shoulders;Anterior pelvic tilt   ROM / Strength   AROM / PROM / Strength AROM;Strength   AROM    AROM Assessment Site Lumbar   Lumbar Extension 15   Lumbar - Right Side Bend 12   Lumbar - Left Side Bend 16   Strength   Strength Assessment Site Hip;Knee;Ankle   Right/Left Hip Right;Left   Right Hip Flexion 3-/5   Right Hip Extension 2+/5   Right Hip ABduction 3/5   Left Hip Flexion 2+/5   Left Hip Extension 2+/5   Left Hip ABduction 3+/5   Right/Left Knee Right;Left   Right Knee Flexion 3+/5   Right Knee Extension 3+/5   Left Knee Flexion 4-/5   Left Knee Extension 3+/5   Right/Left Ankle Right;Left   Right Ankle Dorsiflexion 4/5   Left Ankle Dorsiflexion 5/5   Flexibility   Soft Tissue Assessment /Muscle Length yes   Hamstrings 90/90 Lt 138; Rt 140                    [facility] Adult PT Treatment/Exercise - 12/11/15 0001    Exercises   Exercises Lumbar   Lumbar Exercises: Stretches   Active Hamstring Stretch 2 reps;30 seconds   Single Knee to Chest Stretch 1 rep;30 seconds   Standing Extension 5 reps   Lumbar Exercises: Supine   Ab Set 10 reps   Bridge 10 reps                PT Education - 12/11/15 [phone]    Education provided Yes   Education Details HEP; body mechanics    Person(s) Educated Patient   Methods Explanation;Handout   Comprehension Verbalized understanding;Returned demonstration;Verbal cues required          PT Short Term Goals - 12/11/15 0958    PT SHORT TERM GOAL #1   Title Pt to verbalize the importance of good posture  and good body mechanics in Scott care.    Time 2   Period Weeks   Status New   PT SHORT TERM GOAL #2   Title Pt Scott pain level to decrease to no greater than 5/10 to allow patient to be able to stand for 20 minutes at a time to cook a light meal.    Time 4   Period Weeks   Status New   PT SHORT TERM GOAL #3   Title Patient core and bilateral lower extremity strength to be increased by 1 grade to allow patient to be able to go up and down 20 steps,( two  flight ) without increased pain    Time 4  Period Weeks   Status New   PT SHORT TERM GOAL #4   Title Pt to be able to sit for 40 minutes with no increased Scott pain to allow patient to go out to a restaurant and enjoy a meal.    Time 4   Period Weeks   Status New           PT Long Term Goals - 12/11/15 1005    PT LONG TERM GOAL #1   Title Pt Scott pain level to be no greater than a 3/10 to allow patient to stand for an hour to enjoy cooking a in depth meal.    Time 8   Period Weeks   Status New   PT LONG TERM GOAL #2   Title Patient to be able to step onto a 10" step without increased Scott pain in order to complete required  work processes.     Time 8   Period Weeks   Status New   PT LONG TERM GOAL #3   Title Pt to be able to lift 10# over head without increased Scott pain in order to be able to complete required work processes    Time 8   Period Weeks   Status New   PT LONG TERM GOAL #4   Title Pt to be able to carry and walk with a five pound box in each hand without increased Scott pain in order to be able to complete required work processes    Time 8   Period Weeks   Status New   PT LONG TERM GOAL #5   Title Patient to be able to sit for two hours without increased low Scott pain to be able to travel in a car for prolng time period.    Time 8   Period Weeks   Status New   Additional Long Term Goals   Additional Long Term Goals Yes   PT LONG TERM GOAL #6   Title Pt to states that she has not needed to use her Scott brace in the past three weeks    Time 8   Period Weeks   Status New               Plan - 12/11/15 0949    Clinical Impression Statement Laura Scott is a 55 yo female who has had a remote Scott fusion of L4-5. She states that she did not have therapy following her surgery.  She states that the surgery helped her pain  but it did not get rid of all of her pain.    She has been having injections in her Scott ever since the surgery to control her pain.  The patient states that the last injection she  received,( last week),  did not seem to make a difference in her pain.  She was therefore referred by her physician to physical therapy.   Examination demonstrates decreased ROM of her Scott, tight mm of the Scott, hip and knees, decreased core and bilateral lower extremity strength, increased pain and improper body mechanics.  Ms.  Laura Scott will benefit from skilled physical therapy to address these issues in order to decrease her level of pain and increase her functional activity tolerance.    Rehab Potential Good   PT Frequency 2x / week   PT Duration 8 weeks   PT Treatment/Interventions Moist Heat;Ultrasound;ADLs/Self Care Home Management;Stair training;Functional mobility training;Therapeutic activities;Therapeutic exercise;Balance training;Neuromuscular re-education;Patient/family education;Manual techniques;Passive range of motion   PT Next Visit  Plan begin lumbar stabilization program with standing heel raises , minisquats, supine bent knee raise and clams. Progress to sidelying clams and abduction, prone exercises and higher standing such as lunging, lifting, and yoga poses.    PT Home Exercise Plan given   Consulted and Agree with Plan of Care Patient      Patient will benefit from skilled therapeutic intervention in order to improve the following deficits and impairments:  Decreased activity tolerance, Decreased range of motion, Decreased safety awareness, Decreased strength, Increased fascial restricitons, Impaired flexibility, Pain, Improper body mechanics, Postural dysfunction  Visit Diagnosis: Muscle weakness (generalized) - Plan: PT plan of care cert/re-cert  Low Scott pain without sciatica, unspecified Scott pain laterality - Plan: PT plan of care cert/re-cert  Other symptoms and signs involving the musculoskeletal system - Plan: PT plan of care cert/re-cert     Problem List Patient Active Problem List   Diagnosis Date Noted  . Anxiety 05/11/2015  . Clinical depression  05/11/2015  . Essential hypertension 05/11/2015  . Reflux 05/11/2015  . Lumbar spondylosis 03/26/2014  . Impaired fasting glucose 10/29/2013  . Arthritis 10/29/2013  . Chronic Scott pain 04/11/2013  . Insomnia 04/11/2013  Rayetta Humphrey, PT CLT [phone] 12/11/2015, 10:23 AM  [place] [address] [place], [place], [address] Phone: [phone]   Fax:  [phone]  Name: Laura Scott MRN: [id number] Date of Birth: 01-Nov-1960

## 2015-12-16 ENCOUNTER — Encounter: Payer: Self-pay | Admitting: Nurse Practitioner

## 2015-12-16 ENCOUNTER — Ambulatory Visit (INDEPENDENT_AMBULATORY_CARE_PROVIDER_SITE_OTHER): Payer: 59 | Admitting: Nurse Practitioner

## 2015-12-16 VITALS — BP 140/82 | Ht 66.0 in | Wt 170.6 lb

## 2015-12-16 DIAGNOSIS — I10 Essential (primary) hypertension: Secondary | ICD-10-CM

## 2015-12-16 MED ORDER — LOSARTAN POTASSIUM 50 MG PO TABS: 50.0000 mg | ORAL_TABLET | Freq: Every day | ORAL | Status: DC

## 2015-12-16 NOTE — Progress Notes (Signed)
Subjective:  Presents for recheck on her blood pressure. BP running very high at work, 200s/80s. Takes her BP on a machine there. BP outside of work at [employer] still running high but not quite that high area continues to be under significant stress. Has decreased sodium in her diet. No chest pain/ischemic type pain or shortness of breath. HCTZ is controlling her edema. Very strong family history of hypertension. Gets regular women's health physicals.  Objective:   BP 140/82 mmHg  Ht 5\' 6"  (1.676 m)  Wt 170 lb 9.6 oz (77.384 kg)  BMI 27.55 kg/m2 NAD. Alert, oriented. Lungs clear. Heart regular rate rhythm. No murmur or gallop noted. BP on recheck left arm sitting 168/76.   Assessment:  Problem List Items Addressed This Visit      Cardiovascular and Mediastinum   Essential hypertension - Primary   Relevant Medications   losartan (COZAAR) 50 MG tablet     Plan:  Meds ordered this encounter  Medications  . losartan (COZAAR) 50 MG tablet    Sig: Take 1 tablet (50 mg total) by mouth daily.    Dispense:  90 tablet    Refill:  1    Order Specific Question:  Supervising Provider    Answer:  Mikey Kirschner [[id number]]   Add losartan to regimen. Will start with 50 mg daily. Patient to check BP 2-3 times per week and send results in 4-6 weeks. If systolic blood pressure is not close to 140 at that point, increase dose to 100 mg. Otherwise follow-up in 3 months. Call back sooner if any problems.

## 2015-12-17 ENCOUNTER — Ambulatory Visit (HOSPITAL_COMMUNITY): Payer: 59

## 2015-12-17 DIAGNOSIS — M6281 Muscle weakness (generalized): Secondary | ICD-10-CM

## 2015-12-17 DIAGNOSIS — M545 Low back pain: Secondary | ICD-10-CM

## 2015-12-17 DIAGNOSIS — R29898 Other symptoms and signs involving the musculoskeletal system: Secondary | ICD-10-CM

## 2015-12-17 NOTE — Therapy (Signed)
[place], [place], [address] Phone: [phone]   Fax:  [phone]  Physical Therapy Treatment  Patient Details  Name: Laura Scott MRN: [id number] Date of Birth: 1960/09/06 Referring Provider: Dr. Clydell Hakim   Encounter Date: 12/17/2015      PT End of Session - 12/17/15 0959    Visit Number 2   Number of Visits 16   Date for PT Re-Evaluation 01/10/16   Authorization Type UHC   Authorization - Visit Number 2   Authorization - Number of Visits 16   PT Start Time [id number]   PT Stop Time 1028   PT Time Calculation (min) 41 min   Activity Tolerance Patient tolerated treatment well   Behavior During Therapy [facility] for tasks assessed/performed      Past Medical History  Diagnosis Date  . Hyperlipidemia   . Reflux   . PMS (premenstrual syndrome)   . IBS (irritable bowel syndrome)   . Allergic rhinitis   . Chronic pain   . Depression     Past Surgical History  Procedure Laterality Date  . Colonoscopy  08/29/2011    Procedure: COLONOSCOPY;  Surgeon: Daneil Dolin, MD;  [facility];  Service: Endoscopy;  Laterality: N/A;  9:15 AM  . Lumbar disc surgery      There were no vitals filed for this visit.      Subjective Assessment - 12/17/15 0953    Subjective Pt reports compliance with HEP and has began a walking program for an hour yesterday.  Current pain scale 3/10 soreness LBP Lt>:Rt   Pertinent History HTN   Patient Stated Goals less pain, strengthen back , to be able to complete work activities    Currently in Pain? Yes   Pain Score 3                [facility] Adult PT Treatment/Exercise - 12/17/15 0001    Bed Mobility   Bed Mobility --  instructed proper mechanics for sit to supine (log roll)   Lumbar Exercises: Standing   Heel Raises 10 reps   Heel Raises Limitations toe raises   Functional Squats 10 reps   Functional Squats Limitations minisquat   Other Standing Lumbar  Exercises SLS Lt 15", Rt 45" max of 3   Lumbar Exercises: Supine   Ab Set 10 reps   AB Set Limitations verbal and tactile cueing for TrA activation   Clam 10 reps   Clam Limitations cueing for stability   Bent Knee Raise 10 reps;5 seconds   Bent Knee Raise Limitations with ab set   Bridge 10 reps                  PT Short Term Goals - 12/11/15 0958    PT SHORT TERM GOAL #1   Title Pt to verbalize the importance of good posture  and good body mechanics in back care.    Time 2   Period Weeks   Status New   PT SHORT TERM GOAL #2   Title Pt back pain level to decrease to no greater than 5/10 to allow patient to be able to stand for 20 minutes at a time to cook a light meal.    Time 4   Period Weeks   Status New   PT SHORT TERM GOAL #3   Title Patient core and bilateral lower extremity strength to be increased by 1 grade to allow patient to be  able to go up and down 20 steps,( two  flight ) without increased pain    Time 4   Period Weeks   Status New   PT SHORT TERM GOAL #4   Title Pt to be able to sit for 40 minutes with no increased back pain to allow patient to go out to a restaurant and enjoy a meal.    Time 4   Period Weeks   Status New           PT Long Term Goals - 12/11/15 1005    PT LONG TERM GOAL #1   Title Pt back pain level to be no greater than a 3/10 to allow patient to stand for an hour to enjoy cooking a in depth meal.    Time 8   Period Weeks   Status New   PT LONG TERM GOAL #2   Title Patient to be able to step onto a 10" step without increased back pain in order to complete required  work processes.     Time 8   Period Weeks   Status New   PT LONG TERM GOAL #3   Title Pt to be able to lift 10# over head without increased back pain in order to be able to complete required work processes    Time 8   Period Weeks   Status New   PT LONG TERM GOAL #4   Title Pt to be able to carry and walk with a five pound box in each hand without increased  back pain in order to be able to complete required work processes    Time 8   Period Weeks   Status New   PT LONG TERM GOAL #5   Title Patient to be able to sit for two hours without increased low back pain to be able to travel in a car for prolng time period.    Time 8   Period Weeks   Status New   Additional Long Term Goals   Additional Long Term Goals Yes   PT LONG TERM GOAL #6   Title Pt to states that she has not needed to use her back brace in the past three weeks    Time 8   Period Weeks   Status New               Plan - 12/17/15 1010    Clinical Impression Statement Reviewed goals, compliance and assured correct technqiue with all HEP and copy of eval given to pt.  Pt instructed proper bed mobility getting in and out, encouraged to continue at home to reduce stress on lumbar musculature.  Session focus on improving core stability with therapist facilitaiton for proper form and technqiue.  Noted increased difficulty with stabiltiy exercises with Lt LE requiring increased verbal cueing for control.  End of session pt reports pain reduced.    Rehab Potential Good   PT Frequency 2x / week   PT Duration 8 weeks   PT Treatment/Interventions Moist Heat;Ultrasound;ADLs/Self Care Home Management;Stair training;Functional mobility training;Therapeutic activities;Therapeutic exercise;Balance training;Neuromuscular re-education;Patient/family education;Manual techniques;Passive range of motion   PT Next Visit Plan Continue lumbar stabilization program.   Progress to sidelying clams and abduction, prone exercises and higher standing such as lunging, lifting, and yoga poses.       Patient will benefit from skilled therapeutic intervention in order to improve the following deficits and impairments:  Decreased activity tolerance, Decreased range of motion, Decreased safety awareness, Decreased strength,  Increased fascial restricitons, Impaired flexibility, Pain, Improper body mechanics,  Postural dysfunction  Visit Diagnosis: Muscle weakness (generalized)  Low back pain without sciatica, unspecified back pain laterality  Other symptoms and signs involving the musculoskeletal system     Problem List Patient Active Problem List   Diagnosis Date Noted  . Anxiety 05/11/2015  . Clinical depression 05/11/2015  . Essential hypertension 05/11/2015  . Reflux 05/11/2015  . Lumbar spondylosis 03/26/2014  . Impaired fasting glucose 10/29/2013  . Arthritis 10/29/2013  . Chronic back pain 04/11/2013  . Insomnia 04/11/2013   Ihor Austin, [place]; [place]  Aldona Lento 12/17/2015, 10:29 AM  [place], [place], [address] Phone: [phone]   Fax:  [phone]  Name: Laura Scott MRN: [id number] Date of Birth: May 11, 1961

## 2015-12-21 ENCOUNTER — Ambulatory Visit (HOSPITAL_COMMUNITY): Payer: 59 | Attending: Anesthesiology | Admitting: Physical Therapy

## 2015-12-21 DIAGNOSIS — R29898 Other symptoms and signs involving the musculoskeletal system: Secondary | ICD-10-CM | POA: Diagnosis present

## 2015-12-21 DIAGNOSIS — M6281 Muscle weakness (generalized): Secondary | ICD-10-CM | POA: Diagnosis present

## 2015-12-21 DIAGNOSIS — M545 Low back pain: Secondary | ICD-10-CM | POA: Diagnosis present

## 2015-12-21 NOTE — Therapy (Signed)
[place], [place], [address] Phone: [phone]   Fax:  [phone]  Physical Therapy Treatment  Patient Details  Name: Laura Scott MRN: [id number] Date of Birth: 12-08-1960 Referring Provider: Dr. Clydell Hakim   Encounter Date: 12/21/2015      PT End of Session - 12/21/15 1725    Visit Number 3   Number of Visits 16   Date for PT Re-Evaluation 01/10/16   Authorization Type UHC   Authorization - Visit Number 3   Authorization - Number of Visits 16   PT Start Time [id number]   PT Stop Time 1726   PT Time Calculation (min) 41 min   Activity Tolerance Patient tolerated treatment well   Behavior During Therapy [facility] for tasks assessed/performed      Past Medical History  Diagnosis Date  . Hyperlipidemia   . Reflux   . PMS (premenstrual syndrome)   . IBS (irritable bowel syndrome)   . Allergic rhinitis   . Chronic pain   . Depression     Past Surgical History  Procedure Laterality Date  . Colonoscopy  08/29/2011    Procedure: COLONOSCOPY;  Surgeon: Daneil Dolin, MD;  [facility];  Service: Endoscopy;  Laterality: N/A;  9:15 AM  . Lumbar disc surgery      There were no vitals filed for this visit.      Subjective Assessment - 12/21/15 1731    Subjective Pt reports she has been performing her HEP and has noticed things are getting easier. She has no pain currently.    Pertinent History HTN   Patient Stated Goals less pain, strengthen back , to be able to complete work activities    Currently in Pain? No/denies                         [facility] Adult PT Treatment/Exercise - 12/21/15 0001    Lumbar Exercises: Stretches   Piriformis Stretch 4 reps;20 seconds   Lumbar Exercises: Standing   Heel Raises 20 reps   Heel Raises Limitations toe raises   Wall Slides 10 reps  x2 sets   Lumbar Exercises: Supine   Bent Knee Raise 15 reps   Bent Knee Raise Limitations min cues  required   Bridge 10 reps  x2 sets   Other Supine Lumbar Exercises ab set with alt bent knee lowering 2x20   Other Supine Lumbar Exercises UE pressdown with SLR 2x5 LLE, x10 on RLE   Lumbar Exercises: Sidelying   Hip Abduction 10 reps  against wall, x2 sets                PT Education - 12/21/15 1725    Education provided Yes   Education Details updated HEP; body mechanics   Person(s) Educated Patient   Methods Explanation;Demonstration;Handout   Comprehension Verbalized understanding;Returned demonstration          PT Short Term Goals - 12/11/15 0958    PT SHORT TERM GOAL #1   Title Pt to verbalize the importance of good posture  and good body mechanics in back care.    Time 2   Period Weeks   Status New   PT SHORT TERM GOAL #2   Title Pt back pain level to decrease to no greater than 5/10 to allow patient to be able to stand for 20 minutes at a time to cook a light meal.    Time 4  Period Weeks   Status New   PT SHORT TERM GOAL #3   Title Patient core and bilateral lower extremity strength to be increased by 1 grade to allow patient to be able to go up and down 20 steps,( two  flight ) without increased pain    Time 4   Period Weeks   Status New   PT SHORT TERM GOAL #4   Title Pt to be able to sit for 40 minutes with no increased back pain to allow patient to go out to a restaurant and enjoy a meal.    Time 4   Period Weeks   Status New           PT Long Term Goals - 12/11/15 1005    PT LONG TERM GOAL #1   Title Pt back pain level to be no greater than a 3/10 to allow patient to stand for an hour to enjoy cooking a in depth meal.    Time 8   Period Weeks   Status New   PT LONG TERM GOAL #2   Title Patient to be able to step onto a 10" step without increased back pain in order to complete required  work processes.     Time 8   Period Weeks   Status New   PT LONG TERM GOAL #3   Title Pt to be able to lift 10# over head without increased back pain  in order to be able to complete required work processes    Time 8   Period Weeks   Status New   PT LONG TERM GOAL #4   Title Pt to be able to carry and walk with a five pound box in each hand without increased back pain in order to be able to complete required work processes    Time 8   Period Weeks   Status New   PT LONG TERM GOAL #5   Title Patient to be able to sit for two hours without increased low back pain to be able to travel in a car for prolng time period.    Time 8   Period Weeks   Status New   Additional Long Term Goals   Additional Long Term Goals Yes   PT LONG TERM GOAL #6   Title Pt to states that she has not needed to use her back brace in the past three weeks    Time 8   Period Weeks   Status New               Plan - 12/21/15 1726    Clinical Impression Statement Today's session focused on therex to improve LE strength and endurance of the abdominals during activity. Pt demonstrating good understanding of HEP as well as ability to activate transverse abd. with minimal cuing. She tolerated today's session well with signs of muscle fatigue but no increase in pain. HEP was updated and pt verbalized understanding. Will continue with current POC.   Rehab Potential Good   PT Frequency 2x / week   PT Duration 8 weeks   PT Treatment/Interventions Moist Heat;Ultrasound;ADLs/Self Care Home Management;Stair training;Functional mobility training;Therapeutic activities;Therapeutic exercise;Balance training;Neuromuscular re-education;Patient/family education;Manual techniques;Passive range of motion   PT Next Visit Plan Continue lumbar stabilization program.   Progress to sidelying clams and abduction, prone exercises and higher standing such as lunging, lifting, and yoga poses.    PT Home Exercise Plan updated with bridge and ab set with alt bent knee lowering  Consulted and Agree with Plan of Care Patient      Patient will benefit from skilled therapeutic intervention  in order to improve the following deficits and impairments:  Decreased activity tolerance, Decreased range of motion, Decreased safety awareness, Decreased strength, Increased fascial restricitons, Impaired flexibility, Pain, Improper body mechanics, Postural dysfunction  Visit Diagnosis: Muscle weakness (generalized)  Low back pain without sciatica, unspecified back pain laterality  Other symptoms and signs involving the musculoskeletal system     Problem List Patient Active Problem List   Diagnosis Date Noted  . Anxiety 05/11/2015  . Clinical depression 05/11/2015  . Essential hypertension 05/11/2015  . Reflux 05/11/2015  . Lumbar spondylosis 03/26/2014  . Impaired fasting glucose 10/29/2013  . Arthritis 10/29/2013  . Chronic back pain 04/11/2013  . Insomnia 04/11/2013   5:32 PM,12/21/2015 Laura Scott PT, DPT [facility] [place] [address] [place], [place], [address] Phone: [phone]   Fax:  [phone]  Name: Laura Scott MRN: [id number] Date of Birth: 10/13/1960

## 2015-12-23 ENCOUNTER — Other Ambulatory Visit: Payer: Self-pay | Admitting: Family Medicine

## 2015-12-23 NOTE — Telephone Encounter (Signed)
May have this in 2 refills 

## 2015-12-25 ENCOUNTER — Ambulatory Visit (HOSPITAL_COMMUNITY): Payer: 59 | Admitting: Physical Therapy

## 2015-12-25 DIAGNOSIS — M545 Low back pain: Secondary | ICD-10-CM

## 2015-12-25 DIAGNOSIS — M6281 Muscle weakness (generalized): Secondary | ICD-10-CM | POA: Diagnosis not present

## 2015-12-25 DIAGNOSIS — R29898 Other symptoms and signs involving the musculoskeletal system: Secondary | ICD-10-CM

## 2015-12-25 NOTE — Therapy (Signed)
[place], [place], [address] Phone: [phone]   Fax:  [phone]  Physical Therapy Treatment  Patient Details  Name: Laura Scott MRN: [id number] Date of Birth: [birth year] Referring Provider: Dr. Clydell Hakim   Encounter Date: 12/25/2015      PT End of Session - 12/25/15 1559    Visit Number 4   Number of Visits 16   Date for PT Re-Evaluation 01/10/16   Authorization Type UHC   Authorization - Visit Number 4   Authorization - Number of Visits 16   PT Start Time 1518   PT Stop Time 1557   PT Time Calculation (min) 39 min   Activity Tolerance Patient tolerated treatment well   Behavior During Therapy [facility] for tasks assessed/performed      Past Medical History  Diagnosis Date  . Hyperlipidemia   . Reflux   . PMS (premenstrual syndrome)   . IBS (irritable bowel syndrome)   . Allergic rhinitis   . Chronic pain   . Depression     Past Surgical History  Procedure Laterality Date  . Colonoscopy  08/29/2011    Procedure: COLONOSCOPY;  Surgeon: Daneil Dolin, MD;  [facility];  Service: Endoscopy;  Laterality: N/A;  9:15 AM  . Lumbar disc surgery      There were no vitals filed for this visit.      Subjective Assessment - 12/25/15 1524    Subjective Patient reports that she has been taking a new medication and that it seems to be helping with her blood pressure, it hsa been coming down. Her right knee has been a bit painful recently after some of the exercises she has done here.    Pertinent History HTN   Currently in Pain? Yes   Pain Score 2   R knee 3/10 and L side of back 0/10 right now     Pain Location Knee  R knee and L back    Pain Orientation Left   Pain Descriptors / Indicators Dull   Pain Type Chronic pain   Pain Radiating Towards none    Pain Onset More than a month ago   Pain Frequency Constant   Aggravating Factors  knee unsure, reports that her job makes her back  feel worse, poor posture    Pain Relieving Factors not sure, ice maybe    Effect of Pain on Daily Activities unable to exercise             [facility] PT Assessment - 12/25/15 0001    Observation/Other Assessments   Observations BP 155/85 per manual cuff prior to exercise; L anterior rotation noted, unable to correct with MET                       [facility] Adult PT Treatment/Exercise - 12/25/15 0001    Lumbar Exercises: Stretches   Active Hamstring Stretch 3 reps;30 seconds   Active Hamstring Stretch Limitations 12 inch box    Passive Hamstring Stretch 3 reps;30 seconds   Passive Hamstring Stretch Limitations gastroc on slantboard    Piriformis Stretch 2 reps;30 seconds   Piriformis Stretch Limitations seated   Lumbar Exercises: Standing   Heel Raises 20 reps   Heel Raises Limitations toe and heel    Forward Lunge Limitations attempted however this incresaed back pain    Other Standing Lumbar Exercises 2D hip excursions 1x15  no rotations    Other Standing Lumbar  Exercises postural exercises with red TB including: rows 1x15, scap retractions 1x15, shoulder extensions 1x15    Lumbar Exercises: Supine   Ab Set 15 reps   AB Set Limitations 3 second holds    Bridge 15 reps   Straight Leg Raise 10 reps   Other Supine Lumbar Exercises supine hip abduction 1x10 red TB    Lumbar Exercises: Prone   Straight Leg Raise 10 reps                PT Education - 12/25/15 1559    Education provided No          PT Short Term Goals - 12/11/15 0958    PT SHORT TERM GOAL #1   Title Pt to verbalize the importance of good posture  and good body mechanics in back care.    Time 2   Period Weeks   Status New   PT SHORT TERM GOAL #2   Title Pt back pain level to decrease to no greater than 5/10 to allow patient to be able to stand for 20 minutes at a time to cook a light meal.    Time 4   Period Weeks   Status New   PT SHORT TERM GOAL #3   Title Patient core and bilateral  lower extremity strength to be increased by 1 grade to allow patient to be able to go up and down 20 steps,( two  flight ) without increased pain    Time 4   Period Weeks   Status New   PT SHORT TERM GOAL #4   Title Pt to be able to sit for 40 minutes with no increased back pain to allow patient to go out to a restaurant and enjoy a meal.    Time 4   Period Weeks   Status New           PT Long Term Goals - 12/11/15 1005    PT LONG TERM GOAL #1   Title Pt back pain level to be no greater than a 3/10 to allow patient to stand for an hour to enjoy cooking a in depth meal.    Time 8   Period Weeks   Status New   PT LONG TERM GOAL #2   Title Patient to be able to step onto a 10" step without increased back pain in order to complete required  work processes.     Time 8   Period Weeks   Status New   PT LONG TERM GOAL #3   Title Pt to be able to lift 10# over head without increased back pain in order to be able to complete required work processes    Time 8   Period Weeks   Status New   PT LONG TERM GOAL #4   Title Pt to be able to carry and walk with a five pound box in each hand without increased back pain in order to be able to complete required work processes    Time 8   Period Weeks   Status New   PT LONG TERM GOAL #5   Title Patient to be able to sit for two hours without increased low back pain to be able to travel in a car for prolng time period.    Time 8   Period Weeks   Status New   Additional Long Term Goals   Additional Long Term Goals Yes   PT LONG TERM GOAL #6   Title Pt  to states that she has not needed to use her back brace in the past three weeks    Time 8   Period Weeks   Status New               Plan - 12/25/15 1559    Clinical Impression Statement Check BP at beginning of session, got 155/85 per manual cuff; patient reports taht sicne starting her new medicine that BP has been generally in 150s/80s at home as well. Focused on functional strength  today, introducing postural strengthening to assist patient in maintaining correct posture at this time. Did note L anterior rotation, unable to correct with MET today and will cotntinue to monitor.    Rehab Potential Good   PT Frequency 2x / week   PT Duration 8 weeks   PT Treatment/Interventions Moist Heat;Ultrasound;ADLs/Self Care Home Management;Stair training;Functional mobility training;Therapeutic activities;Therapeutic exercise;Balance training;Neuromuscular re-education;Patient/family education;Manual techniques;Passive range of motion   PT Next Visit Plan Continue lumbar stabilization program.   Progress to sidelying clams and abduction, prone exercises and higher standing such as lunging, lifting, and yoga poses. Check leg lengths, consider possible shoe lift if unable to correct.    Consulted and Agree with Plan of Care Patient      Patient will benefit from skilled therapeutic intervention in order to improve the following deficits and impairments:  Decreased activity tolerance, Decreased range of motion, Decreased safety awareness, Decreased strength, Increased fascial restricitons, Impaired flexibility, Pain, Improper body mechanics, Postural dysfunction  Visit Diagnosis: Muscle weakness (generalized)  Low back pain without sciatica, unspecified back pain laterality  Other symptoms and signs involving the musculoskeletal system     Problem List Patient Active Problem List   Diagnosis Date Noted  . Anxiety 05/11/2015  . Clinical depression 05/11/2015  . Essential hypertension 05/11/2015  . Reflux 05/11/2015  . Lumbar spondylosis 03/26/2014  . Impaired fasting glucose 10/29/2013  . Arthritis 10/29/2013  . Chronic back pain 04/11/2013  . Insomnia 04/11/2013    Deniece Ree PT, DPT [facility] [address] [place], [place], [address] Phone: [phone]   Fax:  [phone]  Name: Faline Langer MRN:  [id number] Date of Birth: 1961/02/17

## 2015-12-30 ENCOUNTER — Ambulatory Visit (HOSPITAL_COMMUNITY): Payer: 59

## 2015-12-30 DIAGNOSIS — M545 Low back pain: Secondary | ICD-10-CM

## 2015-12-30 DIAGNOSIS — M6281 Muscle weakness (generalized): Secondary | ICD-10-CM | POA: Diagnosis not present

## 2015-12-30 DIAGNOSIS — R29898 Other symptoms and signs involving the musculoskeletal system: Secondary | ICD-10-CM

## 2015-12-30 NOTE — Therapy (Signed)
[place], [place], [address] Phone: [phone]   Fax:  [phone]  Physical Therapy Treatment  Patient Details  Name: Laura Scott MRN: [id number] Date of Birth: 1961-01-29 Referring Provider: Dr. Clydell Hakim   Encounter Date: 12/30/2015      PT End of Session - 12/30/15 1658    Visit Number 5   Number of Visits 16   Date for PT Re-Evaluation 01/10/16   Authorization Type UHC   Authorization - Visit Number 5   Authorization - Number of Visits 16   PT Start Time [id number]   PT Stop Time 1738   PT Time Calculation (min) 47 min   Activity Tolerance Patient tolerated treatment well   Behavior During Therapy [facility] for tasks assessed/performed      Past Medical History  Diagnosis Date  . Hyperlipidemia   . Reflux   . PMS (premenstrual syndrome)   . IBS (irritable bowel syndrome)   . Allergic rhinitis   . Chronic pain   . Depression     Past Surgical History  Procedure Laterality Date  . Colonoscopy  08/29/2011    Procedure: COLONOSCOPY;  Surgeon: Daneil Dolin, MD;  [facility];  Service: Endoscopy;  Laterality: N/A;  9:15 AM  . Lumbar disc surgery      There were no vitals filed for this visit.      Subjective Assessment - 12/30/15 1655    Subjective Pt stated her back is feeling good today, reports increased pain Rt knee this week with cramps that she is unable to walk out, has been applying TENS for cramp relief.  Current pain scale 8/10 Rt knee.   Pertinent History HTN   Patient Stated Goals less pain, strengthen back , to be able to complete work activities    Currently in Pain? Yes   Pain Score 8    Pain Location Knee   Pain Orientation Right   Pain Descriptors / Indicators Aching;Sore   Pain Type Chronic pain   Pain Radiating Towards none   Pain Onset More than a month ago   Pain Frequency Constant   Aggravating Factors  knee unsure, reports that her job makes her back feel  worse, poor posture    Pain Relieving Factors not sure, ice maybe    Effect of Pain on Daily Activities unable to exercise              [facility] Adult PT Treatment/Exercise - 12/30/15 0001    Lumbar Exercises: Standing   Functional Squats 15 reps   Functional Squats Limitations cueing for proper form in front of mat   Forward Lunge 10 reps   Forward Lunge Limitations 6in step with therapist facilitation   Scapular Retraction 15 reps;Theraband   Theraband Level (Scapular Retraction) Level 3 (Green)   Scapular Retraction Limitations HEP   Row 15 reps;Theraband   Theraband Level (Row) Level 3 (Green)   Row Limitations HEP   Shoulder Extension 15 reps;Theraband   Theraband Level (Shoulder Extension) Level 3 (Green)   Shoulder Extension Limitations HEP   Other Standing Lumbar Exercises weight shifting    Lumbar Exercises: Supine   Ab Set 15 reps   AB Set Limitations 3 second holds    Bridge 15 reps   Other Supine Lumbar Exercises ab set with bent knee lowering 2x 20   Lumbar Exercises: Sidelying   Clam 10 reps;5 seconds   Clam Limitations     Hip  Abduction 10 reps  cueing for form   Lumbar Exercises: Prone   Straight Leg Raise 10 reps             PT Short Term Goals - 12/11/15 0958    PT SHORT TERM GOAL #1   Title Pt to verbalize the importance of good posture  and good body mechanics in back care.    Time 2   Period Weeks   Status New   PT SHORT TERM GOAL #2   Title Pt back pain level to decrease to no greater than 5/10 to allow patient to be able to stand for 20 minutes at a time to cook a light meal.    Time 4   Period Weeks   Status New   PT SHORT TERM GOAL #3   Title Patient core and bilateral lower extremity strength to be increased by 1 grade to allow patient to be able to go up and down 20 steps,( two  flight ) without increased pain    Time 4   Period Weeks   Status New   PT SHORT TERM GOAL #4   Title Pt to be able to sit for 40 minutes with no  increased back pain to allow patient to go out to a restaurant and enjoy a meal.    Time 4   Period Weeks   Status New           PT Long Term Goals - 12/11/15 1005    PT LONG TERM GOAL #1   Title Pt back pain level to be no greater than a 3/10 to allow patient to stand for an hour to enjoy cooking a in depth meal.    Time 8   Period Weeks   Status New   PT LONG TERM GOAL #2   Title Patient to be able to step onto a 10" step without increased back pain in order to complete required  work processes.     Time 8   Period Weeks   Status New   PT LONG TERM GOAL #3   Title Pt to be able to lift 10# over head without increased back pain in order to be able to complete required work processes    Time 8   Period Weeks   Status New   PT LONG TERM GOAL #4   Title Pt to be able to carry and walk with a five pound box in each hand without increased back pain in order to be able to complete required work processes    Time 8   Period Weeks   Status New   PT LONG TERM GOAL #5   Title Patient to be able to sit for two hours without increased low back pain to be able to travel in a car for prolng time period.    Time 8   Period Weeks   Status New   Additional Long Term Goals   Additional Long Term Goals Yes   PT LONG TERM GOAL #6   Title Pt to states that she has not needed to use her back brace in the past three weeks    Time 8   Period Weeks   Status New               Plan - 12/30/15 1747    Clinical Impression Statement BP at beginning of session was 148/70, monitored through session.  Session focus on improving lumbar stabilizaiton and proximal musculature strengthening.  Reveiewed  activities completeing at home with moderate cueing for proper form with squats and min cueing to improve activatin with TrA.  Pt explained importance of completing all exercises with good form to reduce stress on knee.  Pt able to complete squats with reports of no knee pain this session.  Pt able  to demonstrate approraite form with postural strenghtening exercises, given theraband and printout to add to HEP.  Progress to forward lunges on step with cueing for form, pt able to complete with no reports of increased pain.     Rehab Potential Good   PT Frequency 2x / week   PT Duration 8 weeks   PT Treatment/Interventions Moist Heat;Ultrasound;ADLs/Self Care Home Management;Stair training;Functional mobility training;Therapeutic activities;Therapeutic exercise;Balance training;Neuromuscular re-education;Patient/family education;Manual techniques;Passive range of motion   PT Next Visit Plan Continue lumbar stabilization program, continue with proximal musculature strengthening and higher level standing activities including lunging.  Progress to proper lifting and yoga poses once at good form with squats.  Check SI for leg length discrepancy,  consider possible shoe lift if needed.     PT Home Exercise Plan posture strenghtening exercises with theraband and printout.        Patient will benefit from skilled therapeutic intervention in order to improve the following deficits and impairments:  Decreased activity tolerance, Decreased range of motion, Decreased safety awareness, Decreased strength, Increased fascial restricitons, Impaired flexibility, Pain, Improper body mechanics, Postural dysfunction  Visit Diagnosis: Muscle weakness (generalized)  Low back pain without sciatica, unspecified back pain laterality  Other symptoms and signs involving the musculoskeletal system     Problem List Patient Active Problem List   Diagnosis Date Noted  . Anxiety 05/11/2015  . Clinical depression 05/11/2015  . Essential hypertension 05/11/2015  . Reflux 05/11/2015  . Lumbar spondylosis 03/26/2014  . Impaired fasting glucose 10/29/2013  . Arthritis 10/29/2013  . Chronic back pain 04/11/2013  . Insomnia 04/11/2013   Ihor Austin, [place]; [place]  Aldona Lento 12/30/2015,  5:55 PM  [place] [address] [place], [place], [address] Phone: [phone]   Fax:  [phone]  Name: Laura Scott MRN: [id number] Date of Birth: 10-27-1960

## 2015-12-31 ENCOUNTER — Ambulatory Visit (HOSPITAL_COMMUNITY): Payer: 59

## 2015-12-31 DIAGNOSIS — M6281 Muscle weakness (generalized): Secondary | ICD-10-CM

## 2015-12-31 DIAGNOSIS — R29898 Other symptoms and signs involving the musculoskeletal system: Secondary | ICD-10-CM

## 2015-12-31 DIAGNOSIS — M545 Low back pain: Secondary | ICD-10-CM

## 2015-12-31 NOTE — Therapy (Signed)
[place], [place], [address] Phone: [phone]   Fax:  [phone]  Physical Therapy Treatment  Patient Details  Name: Laura Scott MRN: [id number] Date of Birth: August 07, 1961 Referring Provider: Dr. Clydell Hakim  Encounter Date: 12/31/2015      PT End of Session - 12/31/15 1442    Visit Number 6   Number of Visits 16   Date for PT Re-Evaluation 01/10/16   Authorization Type UHC   Authorization - Visit Number 6   Authorization - Number of Visits 16   PT Start Time [id number]   PT Stop Time [id number]   PT Time Calculation (min) 38 min   Activity Tolerance Patient tolerated treatment well;No increased pain   Behavior During Therapy [facility] for tasks assessed/performed      Past Medical History  Diagnosis Date  . Hyperlipidemia   . Reflux   . PMS (premenstrual syndrome)   . IBS (irritable bowel syndrome)   . Allergic rhinitis   . Chronic pain   . Depression     Past Surgical History  Procedure Laterality Date  . Colonoscopy  08/29/2011    Procedure: COLONOSCOPY;  Surgeon: Daneil Dolin, MD;  [facility];  Service: Endoscopy;  Laterality: N/A;  9:15 AM  . Lumbar disc surgery      There were no vitals filed for this visit.      Subjective Assessment - 12/31/15 1435    Subjective Pt stated she has generalized soreness following treatment yesterday.  Continues to c/o Rt knee pain scale 5/10 today.   Pertinent History HTN   Patient Stated Goals less pain, strengthen back , to be able to complete work activities    Currently in Pain? Yes   Pain Score 5    Pain Location Knee   Pain Orientation Right   Pain Descriptors / Indicators Sore   Pain Type Chronic pain   Pain Radiating Towards none   Pain Onset More than a month ago   Pain Frequency Constant   Aggravating Factors  knee unsure, reports that her job makes her back feel worse, poor posture   Pain Relieving Factors not sure, ice maybe   Effect of Pain on Daily Activities unable to exercise            [facility] PT Assessment - 12/31/15 0001    Assessment   Medical Diagnosis Low back pain with acute exacerbation    Referring Provider Dr. Clydell Hakim   Onset Date/Surgical Date 03/26/14   Next MD Visit June 2017 for shot every 3 months   Prior Therapy none    Precautions   Precautions Back   Precaution Comments fusion no traction    Required Braces or Orthoses Other Brace/Splint            [facility] Adult PT Treatment/Exercise - 12/31/15 0001    Lumbar Exercises: Standing   Functional Squats 15 reps   Functional Squats Limitations cueing for proper form in front of mat   Forward Lunge 10 reps   Forward Lunge Limitations 6in step with therapist facilitation   Other Standing Lumbar Exercises begin vector stance next session   Other Standing Lumbar Exercises SLS Rt 60" Lt 60"   Lumbar Exercises: Supine   Ab Set 15 reps   AB Set Limitations 3 second holds    Bridge 10 reps  extend 1 knee with bridge   Other Supine Lumbar Exercises UE pressdown with SLR 10x each  Lumbar Exercises: Sidelying   Clam 10 reps;5 seconds   Hip Abduction 10 reps   Lumbar Exercises: Prone   Straight Leg Raise 10 reps           PT Short Term Goals - 12/11/15 0958    PT SHORT TERM GOAL #1   Title Pt to verbalize the importance of good posture  and good body mechanics in back care.    Time 2   Period Weeks   Status New   PT SHORT TERM GOAL #2   Title Pt back pain level to decrease to no greater than 5/10 to allow patient to be able to stand for 20 minutes at a time to cook a light meal.    Time 4   Period Weeks   Status New   PT SHORT TERM GOAL #3   Title Patient core and bilateral lower extremity strength to be increased by 1 grade to allow patient to be able to go up and down 20 steps,( two  flight ) without increased pain    Time 4   Period Weeks   Status New   PT SHORT TERM GOAL #4   Title Pt to be able to sit for 40  minutes with no increased back pain to allow patient to go out to a restaurant and enjoy a meal.    Time 4   Period Weeks   Status New           PT Long Term Goals - 12/11/15 1005    PT LONG TERM GOAL #1   Title Pt back pain level to be no greater than a 3/10 to allow patient to stand for an hour to enjoy cooking a in depth meal.    Time 8   Period Weeks   Status New   PT LONG TERM GOAL #2   Title Patient to be able to step onto a 10" step without increased back pain in order to complete required  work processes.     Time 8   Period Weeks   Status New   PT LONG TERM GOAL #3   Title Pt to be able to lift 10# over head without increased back pain in order to be able to complete required work processes    Time 8   Period Weeks   Status New   PT LONG TERM GOAL #4   Title Pt to be able to carry and walk with a five pound box in each hand without increased back pain in order to be able to complete required work processes    Time 8   Period Weeks   Status New   PT LONG TERM GOAL #5   Title Patient to be able to sit for two hours without increased low back pain to be able to travel in a car for prolng time period.    Time 8   Period Weeks   Status New   Additional Long Term Goals   Additional Long Term Goals Yes   PT LONG TERM GOAL #6   Title Pt to states that she has not needed to use her back brace in the past three weeks    Time 8   Period Weeks   Status New               Plan - 12/31/15 1515    Clinical Impression Statement BP at 142/78 mmHg initiially this session.  Session focus on improving lumbar stability and proximal musculature  strengthening.  Pt reports increased soreness today following session yesterday so no new exercises added this session.  Did progress gluteal strengthening with single leg bridges to improve core stability.  Reviewed importance of proper form with exercises for pain control, pt improving mechanics with functional squats wtih less cueing  required.  End of session pt reports pain resolved.  Pt reports she has started a walking program at work, has been walking 45 minutes.   Rehab Potential Good   PT Duration 8 weeks   PT Treatment/Interventions Moist Heat;Ultrasound;ADLs/Self Care Home Management;Stair training;Functional mobility training;Therapeutic activities;Therapeutic exercise;Balance training;Neuromuscular re-education;Patient/family education;Manual techniques;Passive range of motion   PT Next Visit Plan Continue lumbar stabilization program, continue with proximal musculature strengthening and higher level standing activities including lunging.  Progress to proper lifting and yoga poses once at good form with squats.  Check SI for leg length discrepancy,  consider possible shoe lift if needed.        Patient will benefit from skilled therapeutic intervention in order to improve the following deficits and impairments:  Decreased activity tolerance, Decreased range of motion, Decreased safety awareness, Decreased strength, Increased fascial restricitons, Impaired flexibility, Pain, Improper body mechanics, Postural dysfunction  Visit Diagnosis: Muscle weakness (generalized)  Low back pain without sciatica, unspecified back pain laterality  Other symptoms and signs involving the musculoskeletal system     Problem List Patient Active Problem List   Diagnosis Date Noted  . Anxiety 05/11/2015  . Clinical depression 05/11/2015  . Essential hypertension 05/11/2015  . Reflux 05/11/2015  . Lumbar spondylosis 03/26/2014  . Impaired fasting glucose 10/29/2013  . Arthritis 10/29/2013  . Chronic back pain 04/11/2013  . Insomnia 04/11/2013   Ihor Austin, [place]; [place]  Aldona Lento 12/31/2015, 5:44 PM  [facility] [address] [place], [place], [address] Phone: [phone]   Fax:  [phone]  Name: Laura Scott MRN: [id number] Date of Birth:  October 31, 1960

## 2016-01-04 ENCOUNTER — Telehealth (HOSPITAL_COMMUNITY): Payer: Self-pay | Admitting: Physical Therapy

## 2016-01-04 ENCOUNTER — Encounter (HOSPITAL_COMMUNITY): Payer: 59 | Admitting: Physical Therapy

## 2016-01-04 NOTE — Telephone Encounter (Signed)
She had another MD Apptment today

## 2016-01-07 ENCOUNTER — Other Ambulatory Visit: Payer: Self-pay | Admitting: Nurse Practitioner

## 2016-01-08 ENCOUNTER — Ambulatory Visit (HOSPITAL_COMMUNITY): Payer: 59

## 2016-01-08 DIAGNOSIS — M6281 Muscle weakness (generalized): Secondary | ICD-10-CM | POA: Diagnosis not present

## 2016-01-08 DIAGNOSIS — M545 Low back pain: Secondary | ICD-10-CM

## 2016-01-08 DIAGNOSIS — R29898 Other symptoms and signs involving the musculoskeletal system: Secondary | ICD-10-CM

## 2016-01-08 NOTE — Therapy (Signed)
[name], [place], [address] Phone: [phone]   Fax:  [phone]  Physical Therapy Treatment  Patient Details  Name: Laura Scott MRN: [id number] Date of Birth: [birth year] Referring Provider: Dr. Clydell Hakim  Encounter Date: 01/08/2016      PT End of Session - 01/08/16 1654    Visit Number 7   Number of Visits 16   Date for PT Re-Evaluation 01/10/16   Authorization Type UHC   Authorization - Visit Number 7   Authorization - Number of Visits 16   PT Start Time [id number]   PT Stop Time 1735   PT Time Calculation (min) 45 min   Activity Tolerance Patient tolerated treatment well;No increased pain   Behavior During Therapy [facility] for tasks assessed/performed      Past Medical History  Diagnosis Date  . Hyperlipidemia   . Reflux   . PMS (premenstrual syndrome)   . IBS (irritable bowel syndrome)   . Allergic rhinitis   . Chronic pain   . Depression     Past Surgical History  Procedure Laterality Date  . Colonoscopy  08/29/2011    Procedure: COLONOSCOPY;  Surgeon: Daneil Dolin, MD;  [facility];  Service: Endoscopy;  Laterality: N/A;  9:15 AM  . Lumbar disc surgery      There were no vitals filed for this visit.      Subjective Assessment - 01/08/16 1648    Subjective Pt stated she is sore today, with bad muscle spasm cramping feeling on lateral calf, pain scale 6/10.  No reports of back pain today.   Pertinent History HTN   Patient Stated Goals less pain, strengthen back , to be able to complete work activities    Currently in Pain? Yes   Pain Score 6    Pain Location Leg   Pain Orientation Distal;Lateral;Right   Pain Descriptors / Indicators Cramping;Spasm   Pain Type Chronic pain   Pain Radiating Towards Rt lateral calf   Pain Onset More than a month ago   Pain Frequency Constant   Aggravating Factors  knee unsure, reports that her job makes her back feel worse, poor posture   Pain Relieving Factors not sure, ice maybe; TENS unit on lateral Rt calf   Effect of Pain on Daily Activities unable to exercise             [facility] Adult PT Treatment/Exercise - 01/08/16 0001    Lumbar Exercises: Standing   Functional Squats 15 reps   Functional Squats Limitations cueing for proper form in front of mat   Forward Lunge 10 reps   Forward Lunge Limitations 6in step with therapist facilitation   Other Standing Lumbar Exercises vector stance 5x 5"   Lumbar Exercises: Supine   Ab Set 15 reps   AB Set Limitations 3 second holds    Other Supine Lumbar Exercises UE pressdown with SLR 10x each   Lumbar Exercises: Prone   Straight Leg Raise 15 reps   Manual Therapy   Manual Therapy Soft tissue mobilization   Manual therapy comments Manual complete separate of rest of treatment   Soft tissue mobilization STM to Rt peronals to reduce spasm              PT Short Term Goals - 12/11/15 [id number]    PT SHORT TERM GOAL #1   Title Pt to verbalize the importance of good posture  and good body mechanics in back care.  Time 2   Period Weeks   Status New   PT SHORT TERM GOAL #2   Title Pt back pain level to decrease to no greater than 5/10 to allow patient to be able to stand for 20 minutes at a time to cook a light meal.    Time 4   Period Weeks   Status New   PT SHORT TERM GOAL #3   Title Patient core and bilateral lower extremity strength to be increased by 1 grade to allow patient to be able to go up and down 20 steps,( two  flight ) without increased pain    Time 4   Period Weeks   Status New   PT SHORT TERM GOAL #4   Title Pt to be able to sit for 40 minutes with no increased back pain to allow patient to go out to a restaurant and enjoy a meal.    Time 4   Period Weeks   Status New           PT Long Term Goals - 12/11/15 1005    PT LONG TERM GOAL #1   Title Pt back pain level to be no greater than a 3/10 to allow patient to stand for an hour to enjoy  cooking a in depth meal.    Time 8   Period Weeks   Status New   PT LONG TERM GOAL #2   Title Patient to be able to step onto a 10" step without increased back pain in order to complete required  work processes.     Time 8   Period Weeks   Status New   PT LONG TERM GOAL #3   Title Pt to be able to lift 10# over head without increased back pain in order to be able to complete required work processes    Time 8   Period Weeks   Status New   PT LONG TERM GOAL #4   Title Pt to be able to carry and walk with a five pound box in each hand without increased back pain in order to be able to complete required work processes    Time 8   Period Weeks   Status New   PT LONG TERM GOAL #5   Title Patient to be able to sit for two hours without increased low back pain to be able to travel in a car for prolng time period.    Time 8   Period Weeks   Status New   Additional Long Term Goals   Additional Long Term Goals Yes   PT LONG TERM GOAL #6   Title Pt to states that she has not needed to use her back brace in the past three weeks    Time 8   Period Weeks   Status New               Plan - 01/08/16 1735    Clinical Impression Statement BP at 138/74 initially this session.  Continued session foucs on improving lumbar stability and proximal musculature strengthening.  Palpated spasms Rt peronals, soft tissue mobilization technqiues complete to reduce tightness, pt stated no pain following manual and improved gait mechanics.  Progressed to vector stance to improve core and hip stability with min cueing for form.  Pt continues to require therapist facilitation for proper mechanics with exercises especially squats, utilized chiar behind for proper form.  End of session no reports of pain, was limited by fatigue.  Rehab Potential Good   PT Frequency 2x / week   PT Duration 8 weeks   PT Treatment/Interventions Moist Heat;Ultrasound;ADLs/Self Care Home Management;Stair training;Functional  mobility training;Therapeutic activities;Therapeutic exercise;Balance training;Neuromuscular re-education;Patient/family education;Manual techniques;Passive range of motion   PT Next Visit Plan Continue lumbar stabilization program, continue with proximal musculature strengthening and higher level standing activities including lunging.  Progress to proper lifting and yoga poses once at good form with squats.  Check SI for leg length discrepancy,  consider possible shoe lift if needed.       Patient will benefit from skilled therapeutic intervention in order to improve the following deficits and impairments:  Decreased activity tolerance, Decreased range of motion, Decreased safety awareness, Decreased strength, Increased fascial restricitons, Impaired flexibility, Pain, Improper body mechanics, Postural dysfunction  Visit Diagnosis: Muscle weakness (generalized)  Low back pain without sciatica, unspecified back pain laterality  Other symptoms and signs involving the musculoskeletal system     Problem List Patient Active Problem List   Diagnosis Date Noted  . Anxiety 05/11/2015  . Clinical depression 05/11/2015  . Essential hypertension 05/11/2015  . Reflux 05/11/2015  . Lumbar spondylosis 03/26/2014  . Impaired fasting glucose 10/29/2013  . Arthritis 10/29/2013  . Chronic back pain 04/11/2013  . Insomnia 04/11/2013   Ihor Austin, LPTA; CBIS [id number]'  Aldona Lento 01/08/2016, 5:39 PM  [facility] [address] [place], [place], [address] Phone: [phone]   Fax:  [phone]  Name: Cynithia Breisch MRN: [id number] Date of Birth: [birth year]

## 2016-01-13 ENCOUNTER — Ambulatory Visit (HOSPITAL_COMMUNITY): Payer: 59 | Admitting: Physical Therapy

## 2016-01-13 DIAGNOSIS — M545 Low back pain: Secondary | ICD-10-CM

## 2016-01-13 DIAGNOSIS — M6281 Muscle weakness (generalized): Secondary | ICD-10-CM

## 2016-01-13 DIAGNOSIS — R29898 Other symptoms and signs involving the musculoskeletal system: Secondary | ICD-10-CM

## 2016-01-13 NOTE — Therapy (Signed)
[place], [place], [address] Phone: [phone]   Fax:  [phone]  Physical Therapy Treatment (Discharge)  Patient Details  Name: Laura Scott MRN: [id number] Date of Birth: August 21, 1961 Referring Provider: Dr. Clydell Hakim  Encounter Date: 01/13/2016      PT End of Session - 01/13/16 1559    Visit Number 8   Number of Visits [place] Number 8   Authorization - Number of Visits 16   PT Start Time 3094   PT Stop Time 0768  no further skilled PT services needed, DC today    PT Time Calculation (min) 35 min   Activity Tolerance Patient tolerated treatment well   Behavior During Therapy [facility] for tasks assessed/performed      Past Medical History  Diagnosis Date  . Hyperlipidemia   . Reflux   . PMS (premenstrual syndrome)   . IBS (irritable bowel syndrome)   . Allergic rhinitis   . Chronic pain   . Depression     Past Surgical History  Procedure Laterality Date  . Colonoscopy  08/29/2011    Procedure: COLONOSCOPY;  Surgeon: Daneil Dolin, MD;  Location: [facility];  Service: Endoscopy;  Laterality: N/A;  9:15 AM  . Lumbar disc surgery      There were no vitals filed for this visit.      Subjective Assessment - 01/13/16 1515    Subjective Patient reports that she is feeling stiff and sore today, just got off of a long 12 hour shift. She is not hurting as badly, her balance is better, she feels that her core is defintiely getting stronger; she told MD that she feels she can do without the shots for her pain. Patient reports that she is applying lessons from PT to her job. She has been having some swelling on her knee but she is not sure what it is from, maybe squats. No falls or close calls recently.    Pertinent History HTN   How long can you sit comfortably? 5/24- 20 minutes, but can sit for 4 hours    How long can you stand comfortably? 5/24- unlimited     How long can you walk comfortably? 5/24- over 60 minutes    Patient Stated Goals less pain, strengthen back , to be able to complete work activities    Currently in Pain? No/denies  just some soreness R knee             [facility] PT Assessment - 01/13/16 0001    Observation/Other Assessments   Focus on Therapeutic Outcomes (FOTO)  83   AROM   Lumbar Flexion 67   Lumbar Extension 20   Lumbar - Right Side Bend 16   Lumbar - Left Side Bend 13   Strength   Right Hip Flexion 4+/5   Right Hip Extension 3/5   Right Hip ABduction 4-/5   Left Hip Flexion 4+/5   Left Hip Extension 4/5   Left Hip ABduction 4+/5   Right Knee Flexion 4/5   Right Knee Extension 4+/5   Left Knee Flexion 4+/5   Left Knee Extension 4+/5   Right Ankle Dorsiflexion 5/5   Left Ankle Dorsiflexion 5/5   6 minute walk test results    Aerobic Endurance Distance Walked 1823   Endurance additional comments 6MWT  PT Education - 01/13/16 1558    Education provided Yes   Education Details importance of keeping up with activity, [employer] waiver, DC today    Person(s) Educated Patient   Methods Explanation   Comprehension Verbalized understanding          PT Short Term Goals - 01/13/16 1539    PT SHORT TERM GOAL #1   Title Pt to verbalize the importance of good posture  and good body mechanics in back care.    Time 2   Period Weeks   Status Achieved   PT SHORT TERM GOAL #2   Title Pt back pain level to decrease to no greater than 5/10 to allow patient to be able to stand for 20 minutes at a time to cook a light meal.    Baseline 5/24- able to stand beyond this length of time, pain stays below 5/10   Time 4   Period Weeks   Status Achieved   PT SHORT TERM GOAL #3   Title Patient core and bilateral lower extremity strength to be increased by 1 grade to allow patient to be able to go up and down 20 steps,( two  flight ) without increased pain    Time 4   Period  Weeks   Status Achieved   PT SHORT TERM GOAL #4   Title Pt to be able to sit for 40 minutes with no increased back pain to allow patient to go out to a restaurant and enjoy a meal.    Time 4   Period Weeks   Status Achieved           PT Long Term Goals - 01/13/16 1540    PT LONG TERM GOAL #1   Title Pt back pain level to be no greater than a 3/10 to allow patient to stand for an hour to enjoy cooking a in depth meal.    Time 8   Period Weeks   Status Achieved   PT LONG TERM GOAL #2   Title Patient to be able to step onto a 10" step without increased back pain in order to complete required  work processes.     Time 8   Period Weeks   Status Achieved   PT LONG TERM GOAL #3   Title Pt to be able to lift 10# over head without increased back pain in order to be able to complete required work processes    Time 8   Period Weeks   Status Achieved   PT LONG TERM GOAL #4   Title Pt to be able to carry and walk with a five pound box in each hand without increased back pain in order to be able to complete required work processes    Time 8   Period Weeks   Status Achieved   PT LONG TERM GOAL #5   Title Patient to be able to sit for two hours without increased low back pain to be able to travel in a car for prolng time period.    Time 8   Period Weeks   Status Achieved   PT LONG TERM GOAL #6   Title Pt to states that she has not needed to use her back brace in the past three weeks    Baseline 5/24- reports she does use it occasionally but mostly is out of it    Time 8   Period Weeks   Status Partially Met  Plan - 01/13/16 1559    Clinical Impression Statement Re-assessment performed today. Patient doing excellent with skilled PT services, on an independent exercise program, and with her duties at work and reports she feels ready to be [facility]; she has made excellent progess with strength, ROM, and functional task performance skills including functional  squats and  lifting. Patient already performing independent exercise program, no updates made but did educate and give [employer] waiver.  DC today due to patient being pleased with functional status and having met goals.    Rehab Potential Good   PT Next Visit Plan DC today   Consulted and Agree with Plan of Care Patient      Patient will benefit from skilled therapeutic intervention in order to improve the following deficits and impairments:  Decreased activity tolerance, Decreased range of motion, Decreased safety awareness, Decreased strength, Increased fascial restricitons, Impaired flexibility, Pain, Improper body mechanics, Postural dysfunction  Visit Diagnosis: Muscle weakness (generalized)  Low back pain without sciatica, unspecified back pain laterality  Other symptoms and signs involving the musculoskeletal system     Problem List Patient Active Problem List   Diagnosis Date Noted  . Anxiety 05/11/2015  . Clinical depression 05/11/2015  . Essential hypertension 05/11/2015  . Reflux 05/11/2015  . Lumbar spondylosis 03/26/2014  . Impaired fasting glucose 10/29/2013  . Arthritis 10/29/2013  . Chronic back pain 04/11/2013  . Insomnia 04/11/2013   PHYSICAL THERAPY DISCHARGE SUMMARY  Visits from Start of Care: 8  Current functional level related to goals / functional outcomes: Patient doing very well and is able to do all she needs and wants with minimal pain. All functional goals have been met and patient independent in exercise program. DC today.    Remaining deficits: Mild functional weakness, mild back pain    Education / Equipment: [employer], importance of activity  Plan: Patient agrees to discharge.  Patient goals were met. Patient is being discharged due to meeting the stated rehab goals.  ?????       Deniece Ree PT, DPT [place] [address] [place], [place], [address] Phone: [phone]   Fax:   [phone]  Name: Laura Scott MRN: [id number] Date of Birth: 20-Jun-1961

## 2016-01-14 ENCOUNTER — Encounter (HOSPITAL_COMMUNITY): Payer: 59 | Admitting: Physical Therapy

## 2016-01-19 ENCOUNTER — Telehealth: Payer: Self-pay | Admitting: Family Medicine

## 2016-01-19 ENCOUNTER — Other Ambulatory Visit: Payer: Self-pay

## 2016-01-19 MED ORDER — LOSARTAN POTASSIUM 50 MG PO TABS: 50.0000 mg | ORAL_TABLET | Freq: Every day | ORAL | Status: DC

## 2016-01-19 NOTE — Telephone Encounter (Signed)
losartan (COZAAR) 50 MG tablet  Pt needs this sent to [facility] with [facility] day supplies Local pharm does not carry this for her

## 2016-01-19 NOTE — Telephone Encounter (Signed)
Spoke with patient and informed her per protocol refills were sent over on Losartan to [facility]. Patient verbalized understanding.

## 2016-02-17 ENCOUNTER — Other Ambulatory Visit: Payer: Self-pay | Admitting: Nurse Practitioner

## 2016-03-21 ENCOUNTER — Other Ambulatory Visit: Payer: Self-pay | Admitting: Nurse Practitioner

## 2016-03-28 ENCOUNTER — Telehealth: Payer: Self-pay | Admitting: Nurse Practitioner

## 2016-03-28 NOTE — Telephone Encounter (Signed)
Left message to return call 

## 2016-03-28 NOTE — Telephone Encounter (Signed)
Pt called stating that the neurosurgeon gave her topamax and it is causing her fingers,toes and back tingling. Pt wants to know if she should stop taking it. Pt is no longer seeing the dr that prescribed them. Please advise.

## 2016-03-29 NOTE — Telephone Encounter (Signed)
Pt has upcoming appt with carolyn next Wednesday. Pt was seeing dr Christella Noa. Taking topamax 25mg  one bid. Has been taking for about 30 days. Tingling started after taking and is getting worse.

## 2016-03-29 NOTE — Telephone Encounter (Signed)
#  1. Stop Topamax. I do not recommend any other medication until she has appointment with Hoyle Sauer. Also please check to see if from Dr. Christella Noa are in the system if not please request most recent note

## 2016-03-29 NOTE — Telephone Encounter (Signed)
Discussed with pt. Pt states she will stop topamax and discuss at follow up with carolyn. Pt states she was told by dr Luan Pulling at [facility] that she needed to go back to dr Christella Noa but she does not want to. Pt states she will discuss at follow up.

## 2016-03-29 NOTE — Telephone Encounter (Signed)
Left message to return call 

## 2016-04-06 ENCOUNTER — Ambulatory Visit (INDEPENDENT_AMBULATORY_CARE_PROVIDER_SITE_OTHER): Payer: 59 | Admitting: Nurse Practitioner

## 2016-04-06 ENCOUNTER — Encounter: Payer: Self-pay | Admitting: Nurse Practitioner

## 2016-04-06 VITALS — BP 138/82 | Ht 66.0 in | Wt 173.0 lb

## 2016-04-06 DIAGNOSIS — I10 Essential (primary) hypertension: Secondary | ICD-10-CM

## 2016-04-06 DIAGNOSIS — G8929 Other chronic pain: Secondary | ICD-10-CM | POA: Diagnosis not present

## 2016-04-06 DIAGNOSIS — M549 Dorsalgia, unspecified: Secondary | ICD-10-CM

## 2016-04-06 DIAGNOSIS — G4700 Insomnia, unspecified: Secondary | ICD-10-CM | POA: Diagnosis not present

## 2016-04-06 MED ORDER — GABAPENTIN 300 MG PO CAPS: 300.0000 mg | ORAL_CAPSULE | Freq: Three times a day (TID) | ORAL | 1 refills | Status: DC

## 2016-04-06 MED ORDER — ZOLPIDEM TARTRATE 10 MG PO TABS: ORAL_TABLET | ORAL | 5 refills | Status: DC

## 2016-04-06 MED ORDER — HYDROCHLOROTHIAZIDE 25 MG PO TABS: 25.0000 mg | ORAL_TABLET | Freq: Every day | ORAL | 1 refills | Status: DC

## 2016-04-07 ENCOUNTER — Encounter: Payer: Self-pay | Admitting: Nurse Practitioner

## 2016-04-07 NOTE — Addendum Note (Signed)
Addended by: Nilda Simmer on: 04/07/2016 12:13 PM   Modules accepted: Orders

## 2016-04-07 NOTE — Progress Notes (Signed)
Subjective:  Presents for recheck on her chronic back pain. Has a copy of last MRI report dated 03/10/16. Had back surgery by Dr. Christella Noa August 2015. Has had multiple injections over the past year with minimal relief and has been expensive out of pocket. Has done PT over the past several months. Her job was recently modified to try to help with pain. Was not pleased with Dr. Christella Noa, particularly his bedside manner. Sees Dr. Maryjean Ka for injections and receives pain management there at this time. Has an appointment within the next two months but wants to stop injections since no improvement and costs. Would like to come off pain med eventually if possible. Laura Scott for sleep which is working fairly well. Works night shift. Does not take with pain med. Takes occasional HCTZ for edema.  Objective:   BP 138/82   Ht 5\' 6"  (1.676 m)   Wt 173 lb (78.5 kg)   BMI 27.92 kg/m  NAD. Alert, oriented. Lungs clear. Heart RRR. See scanned MRI report.   Assessment:  Problem List Items Addressed This Visit      Cardiovascular and Mediastinum   Essential hypertension   Relevant Medications   hydrochlorothiazide (HYDRODIURIL) 25 MG tablet     Other   Chronic back pain - Primary   Relevant Medications   oxyCODONE-acetaminophen (PERCOCET/ROXICET) 5-325 MG tablet   Insomnia    Other Visit Diagnoses   None.    Plan:  Meds ordered this encounter  Medications         . gabapentin (NEURONTIN) 300 MG capsule    Sig: Take 1 capsule (300 mg total) by mouth 3 (three) times daily.    Dispense:  270 capsule    Refill:  1    Order Specific Question:   Supervising Provider    Answer:   Mikey Kirschner [[id number]]  . zolpidem (AMBIEN) 10 MG tablet    Sig: TAKE (1) TABLET BY MOUTH AT BEDTIME AS NEEDED.    Dispense:  30 tablet    Refill:  5    Order Specific Question:   Supervising Provider    Answer:   Mikey Kirschner [[id number]]  . hydrochlorothiazide (HYDRODIURIL) 25 MG tablet    Sig: Take 1 tablet (25 mg  total) by mouth daily. Prn swelling    Dispense:  90 tablet    Refill:  1    Order Specific Question:   Supervising Provider    Answer:   Mikey Kirschner [[id number]]   Increase gabapentin to TID dosing. Will try to set up referral with another neurosurgeon for second opinion. Continue follow up with specialist for pain management if possible.  Return in about 6 months (around 10/07/2016).

## 2016-04-13 ENCOUNTER — Other Ambulatory Visit: Payer: Self-pay | Admitting: Nurse Practitioner

## 2016-04-13 ENCOUNTER — Encounter: Payer: Self-pay | Admitting: Family Medicine

## 2016-04-14 ENCOUNTER — Telehealth: Payer: Self-pay | Admitting: Family Medicine

## 2016-04-14 ENCOUNTER — Other Ambulatory Visit: Payer: Self-pay | Admitting: Nurse Practitioner

## 2016-04-14 MED ORDER — OMEPRAZOLE 20 MG PO CPDR: 20.0000 mg | DELAYED_RELEASE_CAPSULE | Freq: Every day | ORAL | 1 refills | Status: DC

## 2016-04-14 NOTE — Telephone Encounter (Signed)
Pt's insurance isn't covering Nexium (pt states Hoyle Sauer gave her this med)  Pt requesting Omeprazole - this is covered  Pt would like 90 day Rx sent to her mail order pharmacy, [facility]   Please call when done

## 2016-04-14 NOTE — Telephone Encounter (Signed)
Spoke with patient and informed her that Linzie Collin sent refills on the Omeprazole to her pharmacy. Patient verbalized understanding.

## 2016-04-14 NOTE — Telephone Encounter (Signed)
Done

## 2016-04-15 ENCOUNTER — Other Ambulatory Visit: Payer: Self-pay | Admitting: Nurse Practitioner

## 2016-04-15 NOTE — Telephone Encounter (Signed)
Ok six mo 

## 2016-04-26 ENCOUNTER — Other Ambulatory Visit: Payer: Self-pay | Admitting: Nurse Practitioner

## 2016-04-26 ENCOUNTER — Telehealth: Payer: Self-pay | Admitting: Family Medicine

## 2016-04-26 NOTE — Telephone Encounter (Signed)
Ok times one with one ref 

## 2016-04-26 NOTE — Telephone Encounter (Signed)
Upon review of patient's medications it is okay to do the one refill of tramadol. But I do not recommend Valium. Please discontinue this. Patient currently has Xanax that is written for anxiety. She cannot be on 2 benzodiazepines. Also the patient needs to schedule an office visit to come in and discuss her medication regimen with Hoyle Sauer. Current study showed that taking benzodiazepines with narcotics increases the risk of accidental overdose by greater than 40%. I would recommend that the patient gradually taper off of Xanax over the next 3-4 weeks or taper off of narcotic pain medicines. It is dangerous to be on both. Please cancel diazepam prescription in reprint tramadol and inform the patient of the above information

## 2016-04-27 ENCOUNTER — Other Ambulatory Visit: Payer: Self-pay | Admitting: *Deleted

## 2016-04-27 MED ORDER — TRAMADOL HCL 50 MG PO TABS: 50.0000 mg | ORAL_TABLET | Freq: Three times a day (TID) | ORAL | 0 refills | Status: DC | PRN

## 2016-04-27 NOTE — Telephone Encounter (Signed)
Discussed with pt. Valium canceled. Tramadol sent to pharm. Pt verbalized understanding. Pt states she will call back to schedule office visit with carolyn.

## 2016-05-09 ENCOUNTER — Other Ambulatory Visit: Payer: Self-pay | Admitting: Family Medicine

## 2016-05-09 ENCOUNTER — Ambulatory Visit (INDEPENDENT_AMBULATORY_CARE_PROVIDER_SITE_OTHER): Payer: 59 | Admitting: Nurse Practitioner

## 2016-05-09 ENCOUNTER — Encounter: Payer: Self-pay | Admitting: Nurse Practitioner

## 2016-05-09 VITALS — BP 138/74 | Ht 66.0 in | Wt 172.0 lb

## 2016-05-09 DIAGNOSIS — F419 Anxiety disorder, unspecified: Secondary | ICD-10-CM | POA: Diagnosis not present

## 2016-05-09 DIAGNOSIS — F329 Major depressive disorder, single episode, unspecified: Secondary | ICD-10-CM

## 2016-05-09 DIAGNOSIS — I10 Essential (primary) hypertension: Secondary | ICD-10-CM

## 2016-05-09 DIAGNOSIS — M549 Dorsalgia, unspecified: Secondary | ICD-10-CM | POA: Diagnosis not present

## 2016-05-09 DIAGNOSIS — F32A Depression, unspecified: Secondary | ICD-10-CM

## 2016-05-09 DIAGNOSIS — G8929 Other chronic pain: Secondary | ICD-10-CM

## 2016-05-09 MED ORDER — HYDROCHLOROTHIAZIDE 25 MG PO TABS: 25.0000 mg | ORAL_TABLET | Freq: Every day | ORAL | 1 refills | Status: DC

## 2016-05-09 MED ORDER — ESCITALOPRAM OXALATE 10 MG PO TABS: 10.0000 mg | ORAL_TABLET | Freq: Every day | ORAL | 2 refills | Status: DC

## 2016-05-09 MED ORDER — FLUTICASONE PROPIONATE 50 MCG/ACT NA SUSP: 2.0000 | Freq: Every day | NASAL | 1 refills | Status: DC

## 2016-05-09 MED ORDER — NAPROXEN 500 MG PO TABS: ORAL_TABLET | ORAL | 0 refills | Status: DC

## 2016-05-10 ENCOUNTER — Encounter: Payer: Self-pay | Admitting: Nurse Practitioner

## 2016-05-10 NOTE — Progress Notes (Signed)
Subjective:  Presents to discuss her chronic back pain. Was seen by specialist at [facility], as with previous neurosurgeon, another surgery is not advised. Most recent injections are not working. Patient is trying to decide if she wants to do another set of injections coming up soon. This office is also prescribing her pain med. Pain is there all the time but much worse on days she works. Nothing helps pain on those days. Job has been modified as much as possible. Patient looking into accepting a package for early retirement and applying for disability. Experiencing depression and anxiety symptoms at this time mainly due to stress about work.    Objective:   BP 138/74   Ht 5\' 6"  (1.676 m)   Wt 172 lb (78 kg)   BMI 27.76 kg/m  NAD. Alert, oriented. Lungs clear. Heart RRR.   Assessment:  Problem List Items Addressed This Visit      Cardiovascular and Mediastinum   Essential hypertension   Relevant Medications   hydrochlorothiazide (HYDRODIURIL) 25 MG tablet     Other   Anxiety - Primary   Relevant Medications   escitalopram (LEXAPRO) 10 MG tablet   Chronic back pain   Relevant Medications   naproxen (NAPROSYN) 500 MG tablet   Clinical depression   Relevant Medications   escitalopram (LEXAPRO) 10 MG tablet    Other Visit Diagnoses   None.      Plan:  Meds ordered this encounter  Medications  . fluticasone (FLONASE) 50 MCG/ACT nasal spray    Sig: Place 2 sprays into both nostrils daily.    Dispense:  48 g    Refill:  1    Order Specific Question:   Supervising Provider    Answer:   Mikey Kirschner [[id number]]  . hydrochlorothiazide (HYDRODIURIL) 25 MG tablet    Sig: Take 1 tablet (25 mg total) by mouth daily. Prn swelling    Dispense:  90 tablet    Refill:  1    Order Specific Question:   Supervising Provider    Answer:   Mikey Kirschner [[id number]]  . naproxen (NAPROSYN) 500 MG tablet    Sig: Take 1 tablet by mouth   twice a day with a meals as neded for pain   Dispense:  180 tablet    Refill:  0    Order Specific Question:   Supervising Provider    Answer:   Mikey Kirschner [[id number]]  . escitalopram (LEXAPRO) 10 MG tablet    Sig: Take 1 tablet (10 mg total) by mouth daily.    Dispense:  30 tablet    Refill:  2    Order Specific Question:   Supervising Provider    Answer:   Maggie Font   Explained that we cannot increase her Xanax dose with her current opiate use due to risks. Start Lexapro as directed. Cautioned about potential adverse effects. DC med and contact office if any problems.  Strongly encouraged patient to continue care with pain specialist for pain meds especially if she is considering disability application.  Return in about 1 month (around 06/08/2016) for recheck.

## 2016-05-19 ENCOUNTER — Other Ambulatory Visit: Payer: Self-pay | Admitting: Family Medicine

## 2016-05-20 NOTE — Telephone Encounter (Signed)
Laura Scott just saw 6 mo worth

## 2016-05-21 ENCOUNTER — Other Ambulatory Visit: Payer: Self-pay | Admitting: Family Medicine

## 2016-05-23 MED ORDER — ALPRAZOLAM 0.5 MG PO TABS: ORAL_TABLET | ORAL | 5 refills | Status: DC

## 2016-05-23 NOTE — Addendum Note (Signed)
Addended by: Ofilia Neas R on: 05/23/2016 11:48 AM   Modules accepted: Orders

## 2016-05-23 NOTE — Telephone Encounter (Signed)
Ok if it is time for ref, plus five monthly ref

## 2016-07-02 ENCOUNTER — Other Ambulatory Visit: Payer: Self-pay | Admitting: Family Medicine

## 2016-07-06 ENCOUNTER — Other Ambulatory Visit: Payer: 59 | Admitting: Obstetrics and Gynecology

## 2016-07-19 ENCOUNTER — Other Ambulatory Visit: Payer: Self-pay | Admitting: Family Medicine

## 2016-07-19 NOTE — Telephone Encounter (Signed)
I thought pt was getting oxycodone for pain from pain specialist

## 2016-07-21 ENCOUNTER — Other Ambulatory Visit: Payer: 59 | Admitting: Obstetrics and Gynecology

## 2016-08-02 ENCOUNTER — Encounter: Payer: Self-pay | Admitting: Internal Medicine

## 2016-08-05 ENCOUNTER — Other Ambulatory Visit: Payer: Self-pay | Admitting: Family Medicine

## 2016-08-05 DIAGNOSIS — Z1231 Encounter for screening mammogram for malignant neoplasm of breast: Secondary | ICD-10-CM

## 2016-08-17 ENCOUNTER — Other Ambulatory Visit: Payer: Self-pay | Admitting: Nurse Practitioner

## 2016-09-01 ENCOUNTER — Ambulatory Visit
Admission: RE | Admit: 2016-09-01 | Discharge: 2016-09-01 | Disposition: A | Discharge status: Home / Self Care | Payer: 59 | Source: Ambulatory Visit | Attending: Family Medicine | Admitting: Family Medicine

## 2016-09-01 DIAGNOSIS — Z1231 Encounter for screening mammogram for malignant neoplasm of breast: Secondary | ICD-10-CM

## 2016-09-07 ENCOUNTER — Ambulatory Visit: Payer: 59 | Admitting: Gastroenterology

## 2016-09-17 ENCOUNTER — Other Ambulatory Visit: Payer: Self-pay | Admitting: Nurse Practitioner

## 2016-09-28 ENCOUNTER — Other Ambulatory Visit: Payer: Self-pay

## 2016-09-28 ENCOUNTER — Telehealth: Payer: Self-pay

## 2016-09-28 ENCOUNTER — Other Ambulatory Visit: Payer: Self-pay | Admitting: Nurse Practitioner

## 2016-09-28 ENCOUNTER — Ambulatory Visit (INDEPENDENT_AMBULATORY_CARE_PROVIDER_SITE_OTHER): Payer: 59 | Admitting: Gastroenterology

## 2016-09-28 ENCOUNTER — Encounter: Payer: Self-pay | Admitting: Gastroenterology

## 2016-09-28 DIAGNOSIS — Z8371 Family history of colonic polyps: Secondary | ICD-10-CM

## 2016-09-28 DIAGNOSIS — R1310 Dysphagia, unspecified: Secondary | ICD-10-CM | POA: Insufficient documentation

## 2016-09-28 DIAGNOSIS — K5900 Constipation, unspecified: Secondary | ICD-10-CM

## 2016-09-28 DIAGNOSIS — K625 Hemorrhage of anus and rectum: Secondary | ICD-10-CM | POA: Diagnosis not present

## 2016-09-28 DIAGNOSIS — R1012 Left upper quadrant pain: Secondary | ICD-10-CM

## 2016-09-28 DIAGNOSIS — Z83719 Family history of colon polyps, unspecified: Secondary | ICD-10-CM

## 2016-09-28 DIAGNOSIS — K5903 Drug induced constipation: Secondary | ICD-10-CM | POA: Diagnosis not present

## 2016-09-28 MED ORDER — NA SULFATE-K SULFATE-MG SULF 17.5-3.13-1.6 GM/177ML PO SOLN: 1.0000 | ORAL | 0 refills | Status: DC

## 2016-09-28 MED ORDER — LUBIPROSTONE 24 MCG PO CAPS: 24.0000 ug | ORAL_CAPSULE | Freq: Two times a day (BID) | ORAL | 3 refills | Status: DC

## 2016-09-28 NOTE — Progress Notes (Signed)
Primary Care Physician:  Mickie Hillier, MD  Primary Gastroenterologist:  Garfield Cornea, MD   Chief Complaint  Patient presents with  . Colonoscopy    HPI:  Laura Scott is a 56 y.o. female here To schedule five-year high-risk screening colonoscopy. She has 3 first-degree relatives with history of colon polyps, at least one less than the age of 29. Her last colonoscopy was in 2013, was unremarkable.  Patient states that she has issues with constipation over the past couple years since she's been on chronic pain medication. She is being managed through pain clinic. She was given Movantik 12.5 mg daily but states she doesn't take it everyday because of the expense. With a rebate card because $25 a month but she thought she can only use a rebate card once. She has had improved bowel function since she started the medication. Does not go every day. Some stool still hard but less straining. More complete stools. Complains of bloating and gas. She's had some pain in the left upper quadrant region. She has intermittent heartburn and takes Vaprisol just as needed. Over the past several months she's had some issues with dysphagia, initially with pills but also with some solid foods. Feels like food is hanging in the upper esophagus region. Denies melena. Occasionally has bright red blood per rectum intermittently. Patient tells me she has a history of H. pylori, treated in the remote past. Has had prior endoscopy by Dr. Rowe Pavy per her report.    Current Outpatient Prescriptions  Medication Sig Dispense Refill  . ALPRAZolam (XANAX) 0.5 MG tablet TAKE (1) TABLET BY MOUTH EVERY SIX HOURS AS NEEDED. MAY FILL MONTHLY PER MD. 40 tablet 5  . escitalopram (LEXAPRO) 10 MG tablet TAKE ONE TABLET BY MOUTH DAILY. 30 tablet 0  . fluticasone (FLONASE) 50 MCG/ACT nasal spray Place 2 sprays into both nostrils daily. 48 g 1  . gabapentin (NEURONTIN) 300 MG capsule Take 1 capsule (300 mg total) by mouth 3 (three) times  daily. 270 capsule 1  . hydrochlorothiazide (HYDRODIURIL) 25 MG tablet TAKE 1 TABLET BY MOUTH  DAILY AS NEEDED FOR  SWELLING 90 tablet 0  . losartan (COZAAR) 50 MG tablet TAKE 1 TABLET BY MOUTH  DAILY 90 tablet 1  . naloxegol oxalate (MOVANTIK) 12.5 MG TABS tablet Take 12.5 mg by mouth daily. Patient states she takes as needed but not daily due to cost    . naproxen (NAPROSYN) 500 MG tablet TAKE 1 TABLET BY MOUTH  TWICE A DAY WITH MEALS AS  NEEDED FOR PAIN 180 tablet 0  . omeprazole (PRILOSEC) 20 MG capsule Take 1 capsule (20 mg total) by mouth daily. Prn acid reflux 90 capsule 1  . oxyCODONE-acetaminophen (PERCOCET/ROXICET) 5-325 MG tablet Take by mouth every 8 (eight) hours as needed for severe pain.    Marland Kitchen tiZANidine (ZANAFLEX) 4 MG capsule Take 4 mg by mouth.    . zolpidem (AMBIEN) 10 MG tablet TAKE (1) TABLET BY MOUTH AT BEDTIME AS NEEDED. 30 tablet 5   No current facility-administered medications for this visit.     Allergies as of 09/28/2016 - Review Complete 09/28/2016  Allergen Reaction Noted  . Codeine Itching 07/20/2011  . Sulfa antibiotics Hives, Diarrhea, and Itching 07/20/2011  . Other  07/20/2011  . Penicillins Rash 02/19/2013    Past Medical History:  Diagnosis Date  . Allergic rhinitis   . Chronic pain   . Depression   . Hyperlipidemia   . IBS (irritable bowel syndrome)   .  PMS (premenstrual syndrome)   . Reflux     Past Surgical History:  Procedure Laterality Date  . COLONOSCOPY  08/29/2011   [name]: normal. high risk screening tcs in 5 years.   . LUMBAR DISC SURGERY      Family History  Problem Relation Age of Onset  . Hypertension Father   . Diabetes Father   . Hypertension Mother   . Colon polyps Mother   . Hypertension Sister   . Hyperlipidemia Sister   . Colon polyps Brother     Social History   Social History  . Marital status: Divorced    Spouse name: N/A  . Number of children: N/A  . Years of education: N/A   Occupational History  . Not  on file.   Social History Main Topics  . Smoking status: Never Smoker  . Smokeless tobacco: Never Used  . Alcohol use Yes     Comment: occ  . Drug use: No  . Sexual activity: Yes    Birth control/ protection: None   Other Topics Concern  . Not on file   Social History Narrative  . No narrative on file      ROS:  General: Negative for anorexia, weight loss, fever, chills, fatigue, weakness. Eyes: Negative for vision changes.  ENT: Negative for hoarseness,   nasal congestion.see hpi CV: Negative for chest pain, angina, palpitations, dyspnea on exertion, peripheral edema.  Respiratory: Negative for dyspnea at rest, dyspnea on exertion, cough, sputum, wheezing.  GI: See history of present illness. GU:  Negative for dysuria, hematuria, urinary incontinence, urinary frequency, nocturnal urination.  MS: Negative for joint pain, low back pain.  Derm: Negative for rash or itching.  Neuro: Negative for weakness, abnormal sensation, seizure, frequent headaches, memory loss, confusion.  Psych: Negative for anxiety, depression, suicidal ideation, hallucinations.  Endo: Negative for unusual weight change.  Heme: Negative for bruising or bleeding. Allergy: Negative for rash or hives.    Physical Examination:  BP 137/70   Pulse 86   Temp 98.4 F (36.9 C) (Oral)   Ht 5\' 6"  (1.676 m)   Wt 183 lb 12.8 oz (83.4 kg)   BMI 29.67 kg/m    General: Well-nourished, well-developed in no acute distress.  Head: Normocephalic, atraumatic.   Eyes: Conjunctiva pink, no icterus. Mouth: Oropharyngeal mucosa moist and pink , no lesions erythema or exudate. Neck: Supple without thyromegaly, masses, or lymphadenopathy.  Lungs: Clear to auscultation bilaterally.  Heart: Regular rate and rhythm, no murmurs rubs or gallops.  Abdomen: Bowel sounds are normal, mild luq tenderness, nondistended, no hepatosplenomegaly or masses, no abdominal bruits or    hernia , no rebound or guarding.   Rectal:  deferred Extremities: No lower extremity edema. No clubbing or deformities.  Neuro: Alert and oriented x 4 , grossly normal neurologically.  Skin: Warm and dry, no rash or jaundice.   Psych: Alert and cooperative, normal mood and affect.   Impression/plan: 56 year old lady who presents to schedule five-year follow-up colonoscopy. Family history significant for colon polyps and 3 first-degree relatives at least one at a young age. Offered patient diagnostic colonoscopy today, she has intermittent rectal bleeding as well as change in bowel function although this is likely opioid induced constipation. Given polypharmacy and chronic pain medication, plan for deep sedation in the OR.  I have discussed the risks, alternatives, benefits with regards to but not limited to the risk of reaction to medication, bleeding, infection, perforation and the patient is agreeable to proceed. Written  consent to be obtained. Patient has had good results on Movantik for constipation but not using daily due to expense. Advised that rebate cards are good for one year. She had either continue Movantik or we can try and Amitiza 24 g twice a day. Rebate card provided along with prescription. We do not have samples today. She has been advised to try one or the other of these medications and make sure she is having good bowel movements prior to her colonoscopy bowel prep.  Patient also has dysphagia to pills, solid foods. Intermittent heartburn. Left upper quadrant pain likely related constipation cannot exclude upper GI source however. Offered upper endoscopy with dilation in the near future  I have discussed the risks, alternatives, benefits with regards to but not limited to the risk of reaction to medication, bleeding, infection, perforation and the patient is agreeable to proceed. Written consent to be obtained.

## 2016-09-28 NOTE — Telephone Encounter (Signed)
Called pt. LMOVM and informed of pre-op appt 10/14/16 at 12:45pm. Letter also mailed.

## 2016-09-28 NOTE — Patient Instructions (Signed)
1. Upper endoscopy and colonoscopy as scheduled. See separate instructions.  2. You can either continue Movantik but take daily (coupon is good for a year!) OR we can start Amitiza 12mcg bid with food for constipation. IT IS VERY IMPORTANT THAT YOU ARE HAVING GOOD BMS PRIOR TO TAKING BOWEL PREP FOR COLONOSCOPY!

## 2016-09-29 NOTE — Progress Notes (Signed)
CC'ED TO PCP 

## 2016-09-29 NOTE — Patient Instructions (Signed)
PA# for TCS/EGD/DIL: [id number]

## 2016-09-30 DIAGNOSIS — Z029 Encounter for administrative examinations, unspecified: Secondary | ICD-10-CM

## 2016-10-13 NOTE — Patient Instructions (Signed)
Alivya Spearman  10/13/2016     @PREFPERIOPPHARMACY @   Your procedure is scheduled on  10/20/2016   Report to [facility] at  1  A.M.  Call this number if you have problems the morning of surgery:  [phone]   Remember:  Do not eat food or drink liquids after midnight.  Take these medicines the morning of surgery with A SIP OF WATER: Xanax, lexapro, neurontin, claritin, cozaar, prilosec, oxycodone, zanaflex(if needed).    Do not wear jewelry, make-up or nail polish.  Do not wear lotions, powders, or perfumes, or deoderant.  Do not shave 48 hours prior to surgery.  Men may shave face and neck.  Do not bring valuables to the hospital.  [facility] is not responsible for any belongings or valuables.  Contacts, dentures or bridgework may not be worn into surgery.  Leave your suitcase in the car.  After surgery it may be brought to your room.  For patients admitted to the hospital, discharge time will be determined by your treatment team.  Patients discharged the day of surgery will not be allowed to drive home.   Name and phone number of your driver:   family Special instructions:  Follow  The diet and prep instructions given to you by Dr Roseanne Kaufman office.  Please read over the following fact sheets that you were given. Anesthesia Post-op Instructions and Care and Recovery After Surgery       Esophagogastroduodenoscopy Introduction Esophagogastroduodenoscopy (EGD) is a procedure to examine the lining of the esophagus, stomach, and first part of the small intestine (duodenum). This procedure is done to check for problems such as inflammation, bleeding, ulcers, or growths. During this procedure, a long, flexible, lighted tube with a camera attached (endoscope) is inserted down the throat. Tell a health care provider about:  Any allergies you have.  All medicines you are taking, including vitamins, herbs, eye drops, creams, and over-the-counter  medicines.  Any problems you or family members have had with anesthetic medicines.  Any blood disorders you have.  Any surgeries you have had.  Any medical conditions you have.  Whether you are pregnant or may be pregnant. What are the risks? Generally, this is a safe procedure. However, problems may occur, including:  Infection.  Bleeding.  A tear (perforation) in the esophagus, stomach, or duodenum.  Trouble breathing.  Excessive sweating.  Spasms of the larynx.  A slowed heartbeat.  Low blood pressure. What happens before the procedure?  Follow instructions from your health care provider about eating or drinking restrictions.  Ask your health care provider about:  Changing or stopping your regular medicines. This is especially important if you are taking diabetes medicines or blood thinners.  Taking medicines such as aspirin and ibuprofen. These medicines can thin your blood. Do not take these medicines before your procedure if your health care provider instructs you not to.  Plan to have someone take you home after the procedure.  If you wear dentures, be ready to remove them before the procedure. What happens during the procedure?  To reduce your risk of infection, your health care team will wash or sanitize their hands.  An IV tube will be put in a vein in your hand or arm. You will get medicines and fluids through this tube.  You will be given one or more of the following:  A medicine to help you relax (sedative).  A  medicine to numb the area (local anesthetic). This medicine may be sprayed into your throat. It will make you feel more comfortable and keep you from gagging or coughing during the procedure.  A medicine for pain.  A mouth guard may be placed in your mouth to protect your teeth and to keep you from biting on the endoscope.  You will be asked to lie on your left side.  The endoscope will be lowered down your throat into your esophagus,  stomach, and duodenum.  Air will be put into the endoscope. This will help your health care provider see better.  The lining of your esophagus, stomach, and duodenum will be examined.  Your health care provider may:  Take a tissue sample so it can be looked at in a lab (biopsy).  Remove growths.  Remove objects (foreign bodies) that are stuck.  Treat any bleeding with medicines or other devices that stop tissue from bleeding.  Widen (dilate) or stretch narrowed areas of your esophagus and stomach.  The endoscope will be taken out. The procedure may vary among health care providers and hospitals. What happens after the procedure?  Your blood pressure, heart rate, breathing rate, and blood oxygen level will be monitored often until the medicines you were given have worn off.  Do not eat or drink anything until the numbing medicine has worn off and your gag reflex has returned. This information is not intended to replace advice given to you by your health care provider. Make sure you discuss any questions you have with your health care provider. Document Released: 12/09/2004 Document Revised: 01/14/2016 Document Reviewed: 07/02/2015  2017 Elsevier Esophagogastroduodenoscopy, Care After Introduction Refer to this sheet in the next few weeks. These instructions provide you with information about caring for yourself after your procedure. Your health care provider may also give you more specific instructions. Your treatment has been planned according to current medical practices, but problems sometimes occur. Call your health care provider if you have any problems or questions after your procedure. What can I expect after the procedure? After the procedure, it is common to have:  A sore throat.  Nausea.  Bloating.  Dizziness.  Fatigue. Follow these instructions at home:  Do not eat or drink anything until the numbing medicine (local anesthetic) has worn off and your gag reflex  has returned. You will know that the local anesthetic has worn off when you can swallow comfortably.  Do not drive for 24 hours if you received a medicine to help you relax (sedative).  If your health care provider took a tissue sample for testing during the procedure, make sure to get your test results. This is your responsibility. Ask your health care provider or the department performing the test when your results will be ready.  Keep all follow-up visits as told by your health care provider. This is important. Contact a health care provider if:  You cannot stop coughing.  You are not urinating.  You are urinating less than usual. Get help right away if:  You have trouble swallowing.  You cannot eat or drink.  You have throat or chest pain that gets worse.  You are dizzy or light-headed.  You faint.  You have nausea or vomiting.  You have chills.  You have a fever.  You have severe abdominal pain.  You have black, tarry, or bloody stools. This information is not intended to replace advice given to you by your health care provider. Make sure you discuss  any questions you have with your health care provider. Document Released: 07/25/2012 Document Revised: 01/14/2016 Document Reviewed: 07/02/2015  2017 Elsevier  Esophageal Dilatation Esophageal dilatation is a procedure to open a blocked or narrowed part of the esophagus. The esophagus is the long tube in your throat that carries food and liquid from your mouth to your stomach. The procedure is also called esophageal dilation. You may need this procedure if you have a buildup of scar tissue in your esophagus that makes it difficult, painful, or even impossible to swallow. This can be caused by gastroesophageal reflux disease (GERD). In rare cases, people need this procedure because they have cancer of the esophagus or a problem with the way food moves through the esophagus. Sometimes you may need to have another dilatation  to enlarge the opening of the esophagus gradually. Tell a health care provider about:  Any allergies you have.  All medicines you are taking, including vitamins, herbs, eye drops, creams, and over-the-counter medicines.  Any problems you or family members have had with anesthetic medicines.  Any blood disorders you have.  Any surgeries you have had.  Any medical conditions you have.  Any antibiotic medicines you are required to take before dental procedures. What are the risks? Generally, this is a safe procedure. However, problems can occur and include:  Bleeding from a tear in the lining of the esophagus.  A hole (perforation) in the esophagus. What happens before the procedure?  Do not eat or drink anything after midnight on the night before the procedure or as directed by your health care provider.  Ask your health care provider about changing or stopping your regular medicines. This is especially important if you are taking diabetes medicines or blood thinners.  Plan to have someone take you home after the procedure. What happens during the procedure?  You will be given a medicine that makes you relaxed and sleepy (sedative).  A medicine may be sprayed or gargled to numb the back of the throat.  Your health care provider can use various instruments to do an esophageal dilatation. During the procedure, the instrument used will be placed in your mouth and passed down into your esophagus. Options include:  Simple dilators. This instrument is carefully placed in the esophagus to stretch it.  Guided wire bougies. In this method, a flexible tube (endoscope) is used to insert a wire into the esophagus. The dilator is passed over this wire to enlarge the esophagus. Then the wire is removed.  Balloon dilators. An endoscope with a small balloon at the end is passed down into the esophagus. Inflating the balloon gently stretches the esophagus and opens it up. What happens after  the procedure?  Your blood pressure, heart rate, breathing rate, and blood oxygen level will be monitored often until the medicines you were given have worn off.  Your throat may feel slightly sore and will probably still feel numb. This will improve slowly over time.  You will not be allowed to eat or drink until the throat numbness has resolved.  If this is a same-day procedure, you may be allowed to go home once you have been able to drink, urinate, and sit on the edge of the bed without nausea or dizziness.  If this is a same-day procedure, you should have a friend or family member with you for the next 24 hours after the procedure. This information is not intended to replace advice given to you by your health care provider. Make sure  you discuss any questions you have with your health care provider. Document Released: 09/29/2005 Document Revised: 01/14/2016 Document Reviewed: 12/18/2013 Elsevier Interactive Patient Education  2017 [place].  Colonoscopy, Adult A colonoscopy is an exam to look at the entire large intestine. During the exam, a lubricated, bendable tube is inserted into the anus and then passed into the rectum, colon, and other parts of the large intestine. A colonoscopy is often done as a part of normal colorectal screening or in response to certain symptoms, such as anemia, persistent diarrhea, abdominal pain, and blood in the stool. The exam can help screen for and diagnose medical problems, including:  Tumors.  Polyps.  Inflammation.  Areas of bleeding. Tell a health care provider about:  Any allergies you have.  All medicines you are taking, including vitamins, herbs, eye drops, creams, and over-the-counter medicines.  Any problems you or family members have had with anesthetic medicines.  Any blood disorders you have.  Any surgeries you have had.  Any medical conditions you have.  Any problems you have had passing stool. What are the  risks? Generally, this is a safe procedure. However, problems may occur, including:  Bleeding.  A tear in the intestine.  A reaction to medicines given during the exam.  Infection (rare). What happens before the procedure? Eating and drinking restrictions  Follow instructions from your health care provider about eating and drinking, which may include:  A few days before the procedure - follow a low-fiber diet. Avoid nuts, seeds, dried fruit, raw fruits, and vegetables.  1-3 days before the procedure - follow a clear liquid diet. Drink only clear liquids, such as clear broth or bouillon, black coffee or tea, clear juice, clear soft drinks or sports drinks, gelatin desert, and popsicles. Avoid any liquids that contain red or purple dye.  On the day of the procedure - do not eat or drink anything during the 2 hours before the procedure, or within the time period that your health care provider recommends. Bowel prep  If you were prescribed an oral bowel prep to clean out your colon:  Take it as told by your health care provider. Starting the day before your procedure, you will need to drink a large amount of medicated liquid. The liquid will cause you to have multiple loose stools until your stool is almost clear or light green.  If your skin or anus gets irritated from diarrhea, you may use these to relieve the irritation:  Medicated wipes, such as adult wet wipes with aloe and vitamin E.  A skin soothing-product like petroleum jelly.  If you vomit while drinking the bowel prep, take a break for up to 60 minutes and then begin the bowel prep again. If vomiting continues and you cannot take the bowel prep without vomiting, call your health care provider. General instructions  Ask your health care provider about changing or stopping your regular medicines. This is especially important if you are taking diabetes medicines or blood thinners.  Plan to have someone take you home from the  hospital or clinic. What happens during the procedure?  An IV tube may be inserted into one of your veins.  You will be given medicine to help you relax (sedative).  To reduce your risk of infection:  Your health care team will wash or sanitize their hands.  Your anal area will be washed with soap.  You will be asked to lie on your side with your knees bent.  Your health care  provider will lubricate a long, thin, flexible tube. The tube will have a camera and a light on the end.  The tube will be inserted into your anus.  The tube will be gently eased through your rectum and colon.  Air will be delivered into your colon to keep it open. You may feel some pressure or cramping.  The camera will be used to take images during the procedure.  A small tissue sample may be removed from your body to be examined under a microscope (biopsy). If any potential problems are found, the tissue will be sent to a lab for testing.  If small polyps are found, your health care provider may remove them and have them checked for cancer cells.  The tube that was inserted into your anus will be slowly removed. The procedure may vary among health care providers and hospitals. What happens after the procedure?  Your blood pressure, heart rate, breathing rate, and blood oxygen level will be monitored until the medicines you were given have worn off.  Do not drive for 24 hours after the exam.  You may have a small amount of blood in your stool.  You may pass gas and have mild abdominal cramping or bloating due to the air that was used to inflate your colon during the exam.  It is up to you to get the results of your procedure. Ask your health care provider, or the department performing the procedure, when your results will be ready. This information is not intended to replace advice given to you by your health care provider. Make sure you discuss any questions you have with your health care  provider. Document Released: 08/05/2000 Document Revised: 02/26/2016 Document Reviewed: 10/20/2015 Elsevier Interactive Patient Education  2017 [employer]  Colonoscopy, Adult, Care After This sheet gives you information about how to care for yourself after your procedure. Your health care provider may also give you more specific instructions. If you have problems or questions, contact your health care provider. What can I expect after the procedure? After the procedure, it is common to have:  A small amount of blood in your stool for 24 hours after the procedure.  Some gas.  Mild abdominal cramping or bloating. Follow these instructions at home: General instructions  For the first 24 hours after the procedure:  Do not drive or use machinery.  Do not sign important documents.  Do not drink alcohol.  Do your regular daily activities at a slower pace than normal.  Eat soft, easy-to-digest foods.  Rest often.  Take over-the-counter or prescription medicines only as told by your health care provider.  It is up to you to get the results of your procedure. Ask your health care provider, or the department performing the procedure, when your results will be ready. Relieving cramping and bloating  Try walking around when you have cramps or feel bloated.  Apply heat to your abdomen as told by your health care provider. Use a heat source that your health care provider recommends, such as a moist heat pack or a heating pad.  Place a towel between your skin and the heat source.  Leave the heat on for 20-30 minutes.  Remove the heat if your skin turns bright red. This is especially important if you are unable to feel pain, heat, or cold. You may have a greater risk of getting burned. Eating and drinking  Drink enough fluid to keep your urine clear or pale yellow.  Resume your normal  diet as instructed by your health care provider. Avoid heavy or fried foods that are hard to  digest.  Avoid drinking alcohol for as long as instructed by your health care provider. Contact a health care provider if:  You have blood in your stool 2-3 days after the procedure. Get help right away if:  You have more than a small spotting of blood in your stool.  You pass large blood clots in your stool.  Your abdomen is swollen.  You have nausea or vomiting.  You have a fever.  You have increasing abdominal pain that is not relieved with medicine. This information is not intended to replace advice given to you by your health care provider. Make sure you discuss any questions you have with your health care provider. Document Released: 03/22/2004 Document Revised: 05/02/2016 Document Reviewed: 10/20/2015 Elsevier Interactive Patient Education  2017 [place] Anesthesia is a term that refers to techniques, procedures, and medicines that help a person stay safe and comfortable during a medical procedure. Monitored anesthesia care, or sedation, is one type of anesthesia. Your anesthesia specialist may recommend sedation if you will be having a procedure that does not require you to be unconscious, such as:  Cataract surgery.  A dental procedure.  A biopsy.  A colonoscopy. During the procedure, you may receive a medicine to help you relax (sedative). There are three levels of sedation:  Mild sedation. At this level, you may feel awake and relaxed. You will be able to follow directions.  Moderate sedation. At this level, you will be sleepy. You may not remember the procedure.  Deep sedation. At this level, you will be asleep. You will not remember the procedure. The more medicine you are given, the deeper your level of sedation will be. Depending on how you respond to the procedure, the anesthesia specialist may change your level of sedation or the type of anesthesia to fit your needs. An anesthesia specialist will monitor you closely during the  procedure. Let your health care provider know about:  Any allergies you have.  All medicines you are taking, including vitamins, herbs, eye drops, creams, and over-the-counter medicines.  Any use of steroids (by mouth or as a cream).  Any problems you or family members have had with sedatives and anesthetic medicines.  Any blood disorders you have.  Any surgeries you have had.  Any medical conditions you have, such as sleep apnea.  Whether you are pregnant or may be pregnant.  Any use of cigarettes, alcohol, or street drugs. What are the risks? Generally, this is a safe procedure. However, problems may occur, including:  Getting too much medicine (oversedation).  Nausea.  Allergic reaction to medicines.  Trouble breathing. If this happens, a breathing tube may be used to help with breathing. It will be removed when you are awake and breathing on your own.  Heart trouble.  Lung trouble. Before the procedure Staying hydrated  Follow instructions from your health care provider about hydration, which may include:  Up to 2 hours before the procedure - you may continue to drink clear liquids, such as water, clear fruit juice, black coffee, and plain tea. Eating and drinking restrictions  Follow instructions from your health care provider about eating and drinking, which may include:  8 hours before the procedure - stop eating heavy meals or foods such as meat, fried foods, or fatty foods.  6 hours before the procedure - stop eating light meals or foods, such  as toast or cereal.  6 hours before the procedure - stop drinking milk or drinks that contain milk.  2 hours before the procedure - stop drinking clear liquids. Medicines  Ask your health care provider about:  Changing or stopping your regular medicines. This is especially important if you are taking diabetes medicines or blood thinners.  Taking medicines such as aspirin and ibuprofen. These medicines can thin your  blood. Do not take these medicines before your procedure if your health care provider instructs you not to. Tests and exams  You will have a physical exam.  You may have blood tests done to show:  How well your kidneys and liver are working.  How well your blood can clot.  General instructions  Plan to have someone take you home from the hospital or clinic.  If you will be going home right after the procedure, plan to have someone with you for 24 hours. What happens during the procedure?  Your blood pressure, heart rate, breathing, level of pain and overall condition will be monitored.  An IV tube will be inserted into one of your veins.  Your anesthesia specialist will give you medicines as needed to keep you comfortable during the procedure. This may mean changing the level of sedation.  The procedure will be performed. After the procedure  Your blood pressure, heart rate, breathing rate, and blood oxygen level will be monitored until the medicines you were given have worn off.  Do not drive for 24 hours if you received a sedative.  You may:  Feel sleepy, clumsy, or nauseous.  Feel forgetful about what happened after the procedure.  Have a sore throat if you had a breathing tube during the procedure.  Vomit. This information is not intended to replace advice given to you by your health care provider. Make sure you discuss any questions you have with your health care provider. Document Released: 05/04/2005 Document Revised: 01/15/2016 Document Reviewed: 11/29/2015 Elsevier Interactive Patient Education  2017 [place], Care After These instructions provide you with information about caring for yourself after your procedure. Your health care provider may also give you more specific instructions. Your treatment has been planned according to current medical practices, but problems sometimes occur. Call your health care provider if you have any  problems or questions after your procedure. What can I expect after the procedure? After your procedure, it is common to:  Feel sleepy for several hours.  Feel clumsy and have poor balance for several hours.  Feel forgetful about what happened after the procedure.  Have poor judgment for several hours.  Feel nauseous or vomit.  Have a sore throat if you had a breathing tube during the procedure. Follow these instructions at home: For at least 24 hours after the procedure:   Do not:  Participate in activities in which you could fall or become injured.  Drive.  Use heavy machinery.  Drink alcohol.  Take sleeping pills or medicines that cause drowsiness.  Make important decisions or sign legal documents.  Take care of children on your own.  Rest. Eating and drinking  Follow the diet that is recommended by your health care provider.  If you vomit, drink water, juice, or soup when you can drink without vomiting.  Make sure you have little or no nausea before eating solid foods. General instructions  Have a responsible adult stay with you until you are awake and alert.  Take over-the-counter and prescription medicines only as  told by your health care provider.  If you smoke, do not smoke without supervision.  Keep all follow-up visits as told by your health care provider. This is important. Contact a health care provider if:  You keep feeling nauseous or you keep vomiting.  You feel light-headed.  You develop a rash.  You have a fever. Get help right away if:  You have trouble breathing. This information is not intended to replace advice given to you by your health care provider. Make sure you discuss any questions you have with your health care provider. Document Released: 11/29/2015 Document Revised: 03/30/2016 Document Reviewed: 11/29/2015 Elsevier Interactive Patient Education  2017 [employer].

## 2016-10-14 ENCOUNTER — Encounter (HOSPITAL_COMMUNITY)
Admission: RE | Admit: 2016-10-14 | Discharge: 2016-10-14 | Disposition: A | Discharge status: Home / Self Care | Payer: 59 | Source: Ambulatory Visit | Attending: Internal Medicine | Admitting: Internal Medicine

## 2016-10-14 ENCOUNTER — Encounter (HOSPITAL_COMMUNITY): Payer: Self-pay

## 2016-10-14 ENCOUNTER — Other Ambulatory Visit: Payer: Self-pay

## 2016-10-14 DIAGNOSIS — Z01812 Encounter for preprocedural laboratory examination: Secondary | ICD-10-CM | POA: Insufficient documentation

## 2016-10-14 DIAGNOSIS — Z01810 Encounter for preprocedural cardiovascular examination: Secondary | ICD-10-CM | POA: Insufficient documentation

## 2016-10-14 HISTORY — DX: Headache: R51

## 2016-10-14 HISTORY — DX: Anxiety disorder, unspecified: F41.9

## 2016-10-14 HISTORY — DX: Essential (primary) hypertension: I10

## 2016-10-14 HISTORY — DX: Gastro-esophageal reflux disease without esophagitis: K21.9

## 2016-10-14 HISTORY — DX: Headache, unspecified: R51.9

## 2016-10-14 LAB — BASIC METABOLIC PANEL
Anion gap: 11 (ref 5–15)
BUN: 11 mg/dL (ref 6–20)
CO2: 23 mmol/L (ref 22–32)
Calcium: 9.2 mg/dL (ref 8.9–10.3)
Chloride: 100 mmol/L — ABNORMAL LOW (ref 101–111)
Creatinine, Ser: 0.85 mg/dL (ref 0.44–1.00)
GFR calc Af Amer: >60 mL/min (ref >60)
GFR calc non Af Amer: >60 mL/min (ref >60)
Glucose, Bld: 197 mg/dL — ABNORMAL HIGH (ref 65–99)
Potassium: 3.5 mmol/L (ref 3.5–5.1)
Sodium: 134 mmol/L — ABNORMAL LOW (ref 135–145)

## 2016-10-14 LAB — CBC WITH DIFFERENTIAL/PLATELET
Basophils Absolute: 0 10*3/uL (ref 0.0–0.1)
Basophils Relative: 0 %
Eosinophils Absolute: 0.1 10*3/uL (ref 0.0–0.7)
Eosinophils Relative: 1 %
HCT: 37.1 % (ref 36.0–46.0)
Hemoglobin: 13 g/dL (ref 12.0–15.0)
Lymphocytes Relative: 20 %
Lymphs Abs: 1.3 10*3/uL (ref 0.7–4.0)
MCH: 32.8 pg (ref 26.0–34.0)
MCHC: 35 g/dL (ref 30.0–36.0)
MCV: 93.7 fL (ref 78.0–100.0)
Monocytes Absolute: 0.5 10*3/uL (ref 0.1–1.0)
Monocytes Relative: 8 %
Neutro Abs: 4.6 10*3/uL (ref 1.7–7.7)
Neutrophils Relative %: 71 %
Platelets: 284 10*3/uL (ref 150–400)
RBC: 3.96 MIL/uL (ref 3.87–5.11)
RDW: 11.7 % (ref 11.5–15.5)
WBC: 6.5 10*3/uL (ref 4.0–10.5)

## 2016-10-20 ENCOUNTER — Encounter (HOSPITAL_COMMUNITY): Payer: Self-pay

## 2016-10-20 ENCOUNTER — Ambulatory Visit (HOSPITAL_COMMUNITY): Payer: 59 | Admitting: Anesthesiology

## 2016-10-20 ENCOUNTER — Ambulatory Visit (HOSPITAL_COMMUNITY)
Admission: RE | Admit: 2016-10-20 | Discharge: 2016-10-20 | Disposition: A | Discharge status: Home / Self Care | Payer: 59 | Source: Ambulatory Visit | Attending: Internal Medicine | Admitting: Internal Medicine

## 2016-10-20 ENCOUNTER — Encounter (HOSPITAL_COMMUNITY)
Admission: RE | Disposition: A | Discharge status: Home / Self Care | Payer: Self-pay | Source: Ambulatory Visit | Attending: Internal Medicine

## 2016-10-20 DIAGNOSIS — Z8619 Personal history of other infectious and parasitic diseases: Secondary | ICD-10-CM | POA: Insufficient documentation

## 2016-10-20 DIAGNOSIS — K269 Duodenal ulcer, unspecified as acute or chronic, without hemorrhage or perforation: Secondary | ICD-10-CM | POA: Diagnosis not present

## 2016-10-20 DIAGNOSIS — K3189 Other diseases of stomach and duodenum: Secondary | ICD-10-CM | POA: Insufficient documentation

## 2016-10-20 DIAGNOSIS — Z79891 Long term (current) use of opiate analgesic: Secondary | ICD-10-CM | POA: Diagnosis not present

## 2016-10-20 DIAGNOSIS — K5900 Constipation, unspecified: Secondary | ICD-10-CM | POA: Diagnosis present

## 2016-10-20 DIAGNOSIS — E785 Hyperlipidemia, unspecified: Secondary | ICD-10-CM | POA: Insufficient documentation

## 2016-10-20 DIAGNOSIS — Z83719 Family history of colon polyps, unspecified: Secondary | ICD-10-CM

## 2016-10-20 DIAGNOSIS — R1310 Dysphagia, unspecified: Secondary | ICD-10-CM

## 2016-10-20 DIAGNOSIS — D120 Benign neoplasm of cecum: Secondary | ICD-10-CM | POA: Insufficient documentation

## 2016-10-20 DIAGNOSIS — Z791 Long term (current) use of non-steroidal anti-inflammatories (NSAID): Secondary | ICD-10-CM | POA: Insufficient documentation

## 2016-10-20 DIAGNOSIS — G8929 Other chronic pain: Secondary | ICD-10-CM | POA: Diagnosis not present

## 2016-10-20 DIAGNOSIS — Z1211 Encounter for screening for malignant neoplasm of colon: Secondary | ICD-10-CM

## 2016-10-20 DIAGNOSIS — F329 Major depressive disorder, single episode, unspecified: Secondary | ICD-10-CM | POA: Insufficient documentation

## 2016-10-20 DIAGNOSIS — R1012 Left upper quadrant pain: Secondary | ICD-10-CM

## 2016-10-20 DIAGNOSIS — K449 Diaphragmatic hernia without obstruction or gangrene: Secondary | ICD-10-CM | POA: Diagnosis not present

## 2016-10-20 DIAGNOSIS — K625 Hemorrhage of anus and rectum: Secondary | ICD-10-CM

## 2016-10-20 DIAGNOSIS — Z9889 Other specified postprocedural states: Secondary | ICD-10-CM | POA: Diagnosis not present

## 2016-10-20 DIAGNOSIS — Z8249 Family history of ischemic heart disease and other diseases of the circulatory system: Secondary | ICD-10-CM | POA: Insufficient documentation

## 2016-10-20 DIAGNOSIS — Z79899 Other long term (current) drug therapy: Secondary | ICD-10-CM | POA: Diagnosis not present

## 2016-10-20 DIAGNOSIS — Z885 Allergy status to narcotic agent status: Secondary | ICD-10-CM | POA: Insufficient documentation

## 2016-10-20 DIAGNOSIS — K589 Irritable bowel syndrome without diarrhea: Secondary | ICD-10-CM | POA: Diagnosis not present

## 2016-10-20 DIAGNOSIS — Z7951 Long term (current) use of inhaled steroids: Secondary | ICD-10-CM | POA: Diagnosis not present

## 2016-10-20 DIAGNOSIS — Z1212 Encounter for screening for malignant neoplasm of rectum: Secondary | ICD-10-CM | POA: Diagnosis not present

## 2016-10-20 DIAGNOSIS — J309 Allergic rhinitis, unspecified: Secondary | ICD-10-CM | POA: Insufficient documentation

## 2016-10-20 DIAGNOSIS — K635 Polyp of colon: Secondary | ICD-10-CM

## 2016-10-20 DIAGNOSIS — Z833 Family history of diabetes mellitus: Secondary | ICD-10-CM | POA: Insufficient documentation

## 2016-10-20 DIAGNOSIS — Z882 Allergy status to sulfonamides status: Secondary | ICD-10-CM | POA: Diagnosis not present

## 2016-10-20 DIAGNOSIS — Z8371 Family history of colonic polyps: Secondary | ICD-10-CM | POA: Diagnosis not present

## 2016-10-20 DIAGNOSIS — Z880 Allergy status to penicillin: Secondary | ICD-10-CM | POA: Diagnosis not present

## 2016-10-20 DIAGNOSIS — K6389 Other specified diseases of intestine: Secondary | ICD-10-CM | POA: Diagnosis not present

## 2016-10-20 DIAGNOSIS — K210 Gastro-esophageal reflux disease with esophagitis: Secondary | ICD-10-CM | POA: Insufficient documentation

## 2016-10-20 HISTORY — PX: POLYPECTOMY: SHX5525

## 2016-10-20 HISTORY — PX: COLONOSCOPY WITH PROPOFOL: SHX5780

## 2016-10-20 HISTORY — PX: MALONEY DILATION: SHX5535

## 2016-10-20 HISTORY — PX: ESOPHAGOGASTRODUODENOSCOPY (EGD) WITH PROPOFOL: SHX5813

## 2016-10-20 LAB — GLUCOSE, CAPILLARY: Glucose-Capillary: 118 mg/dL — ABNORMAL HIGH (ref 65–99)

## 2016-10-20 SURGERY — COLONOSCOPY WITH PROPOFOL
Anesthesia: Monitor Anesthesia Care

## 2016-10-20 MED ORDER — PROPOFOL 500 MG/50ML IV EMUL
INTRAVENOUS | Status: DC | PRN
  Administered 2016-10-20: 110 ug/kg/min via INTRAVENOUS
  Administered 2016-10-20: 75 ug/kg/min via INTRAVENOUS

## 2016-10-20 MED ORDER — FENTANYL CITRATE (PF) 100 MCG/2ML IJ SOLN
INTRAMUSCULAR | Status: AC
  Filled 2016-10-20: qty 2

## 2016-10-20 MED ORDER — PROPOFOL 10 MG/ML IV BOLUS
INTRAVENOUS | Status: AC
  Filled 2016-10-20: qty 20

## 2016-10-20 MED ORDER — CHLORHEXIDINE GLUCONATE CLOTH 2 % EX PADS: 6.0000 | MEDICATED_PAD | Freq: Once | CUTANEOUS | Status: DC

## 2016-10-20 MED ORDER — FENTANYL CITRATE (PF) 100 MCG/2ML IJ SOLN
25.0000 ug | INTRAMUSCULAR | Status: AC
  Administered 2016-10-20 (×2): 25 ug via INTRAVENOUS

## 2016-10-20 MED ORDER — LACTATED RINGERS IV SOLN
INTRAVENOUS | Status: DC
  Administered 2016-10-20 (×2): via INTRAVENOUS

## 2016-10-20 MED ORDER — PROPOFOL 10 MG/ML IV BOLUS
INTRAVENOUS | Status: DC | PRN
  Administered 2016-10-20 (×4): 20 mg via INTRAVENOUS

## 2016-10-20 MED ORDER — LIDOCAINE VISCOUS 2 % MT SOLN
15.0000 mL | Freq: Once | OROMUCOSAL | Status: AC
  Administered 2016-10-20: 15 mL via OROMUCOSAL

## 2016-10-20 MED ORDER — LIDOCAINE VISCOUS 2 % MT SOLN
OROMUCOSAL | Status: AC
  Filled 2016-10-20: qty 15

## 2016-10-20 MED ORDER — MIDAZOLAM HCL 2 MG/2ML IJ SOLN
INTRAMUSCULAR | Status: AC
  Filled 2016-10-20: qty 2

## 2016-10-20 MED ORDER — MIDAZOLAM HCL 2 MG/2ML IJ SOLN
1.0000 mg | INTRAMUSCULAR | Status: AC
  Administered 2016-10-20 (×2): 2 mg via INTRAVENOUS
  Filled 2016-10-20: qty 2

## 2016-10-20 MED ORDER — PROPOFOL 10 MG/ML IV BOLUS
INTRAVENOUS | Status: AC
  Filled 2016-10-20: qty 40

## 2016-10-20 MED ORDER — LIDOCAINE HCL (PF) 1 % IJ SOLN
INTRAMUSCULAR | Status: AC
  Filled 2016-10-20: qty 5

## 2016-10-20 NOTE — Op Note (Signed)
[facility] Patient Name: Laura Scott Procedure Date: 10/20/2016 10:21 AM MRN: [id number] Date of Birth: [birth year] Attending MD: Norvel Richards , MD CSN: [id number] Age: 56 Admit Type: Outpatient Procedure:                Upper GI endoscopy with [facility] dilation Indications:              Dysphagia Providers:                Norvel Richards, MD, Lurline Del, RN, Purcell Nails.                            , [employer] Referring MD:              Medicines:                Propofol per Anesthesia Complications:            No immediate complications. Estimated Blood Loss:     Estimated blood loss: none. Procedure:                Pre-Anesthesia Assessment:                           - Prior to the procedure, a History and Physical                            was performed, and patient medications and                            allergies were reviewed. The patient's tolerance of                            previous anesthesia was also reviewed. The risks                            and benefits of the procedure and the sedation                            options and risks were discussed with the patient.                            All questions were answered, and informed consent                            was obtained. Prior Anticoagulants: The patient has                            taken no previous anticoagulant or antiplatelet                            agents. ASA Grade Assessment: II - A patient with                            mild systemic disease. After reviewing the risks  and benefits, the patient was deemed in                            satisfactory condition to undergo the procedure.                           After obtaining informed consent, the endoscope was                            passed under direct vision. Throughout the                            procedure, the patient's blood pressure, pulse, and                            oxygen  saturations were monitored continuously. The                            [id number]) scope was introduced through the                            and advanced to the second part of duodenum. The                            upper GI endoscopy was accomplished without                            difficulty. The patient tolerated the procedure                            well. Scope In: 10:29:06 AM Scope Out: 10:34:28 AM Total Procedure Duration: 0 hours 5 minutes 22 seconds  Findings:      LA Grade A (one or more mucosal breaks less than 5 mm, not extending       between tops of 2 mucosal folds) esophagitis was found 35 to 36 cm from       the incisors. No Barrett's epithelium seen.      A small hiatal hernia was present.      The exam was otherwise without abnormality.      Mild mucosal changes were found in the duodenal bulb. The scope was       withdrawn. Dilation was performed with a Maloney dilator with no       resistance at 31 Fr. The dilation site was examined and showed no       change. Estimated blood loss: none. Impression:               - LA Grade A esophagitis. Dilated.                           - Small hiatal hernia.                           - The examination was otherwise normal.                           -  duodenal erosions                           - No specimens collected. Moderate Sedation:      Moderate (conscious) sedation was personally administered by an       anesthesia professional. The following parameters were monitored: oxygen       saturation, heart rate, blood pressure, respiratory rate, EKG, adequacy       of pulmonary ventilation, and response to care. Total physician       intraservice time was 16 minutes. Recommendation:           - Patient has a contact number available for                            emergencies. The signs and symptoms of potential                            delayed complications were discussed with the                             patient. Return to normal activities tomorrow.                            Written discharge instructions were provided to the                            patient.                           - Resume previous diet.                           - Continue present medications. Increase omeprazole                            to 40 mg daily See colonoscop Report                           - No repeat upper endoscopy.                           - Return to GI office in 3 months. Procedure Code(s):        --- Professional ---                           [phone], Esophagogastroduodenoscopy, flexible,                            transoral; diagnostic, including collection of                            specimen(s) by brushing or washing, when performed                            (separate procedure)  43450, Dilation of esophagus, by unguided sound or                            bougie, single or multiple passes Diagnosis Code(s):        --- Professional ---                           K20.9, Esophagitis, unspecified                           K44.9, Diaphragmatic hernia without obstruction or                            gangrene                           K31.89, Other diseases of stomach and duodenum                           R13.10, Dysphagia, unspecified CPT copyright 2016 American Medical Association. All rights reserved. The codes documented in this report are preliminary and upon coder review may  be revised to meet current compliance requirements. Cristopher Estimable. Giovany Cosby, MD Norvel Richards, MD 10/20/2016 10:59:09 AM This report has been signed electronically. Number of Addenda: 0

## 2016-10-20 NOTE — Anesthesia Procedure Notes (Signed)
Procedure Name: MAC Date/Time: 10/20/2016 10:16 AM Performed by: Vista Deck Pre-anesthesia Checklist: Patient identified, Emergency Drugs available, Suction available, Timeout performed and Patient being monitored Patient Re-evaluated:Patient Re-evaluated prior to inductionOxygen Delivery Method: Non-rebreather mask

## 2016-10-20 NOTE — H&P (View-Only) (Signed)
Primary Care Physician:  Mickie Hillier, MD  Primary Gastroenterologist:  Garfield Cornea, MD   Chief Complaint  Patient presents with  . Colonoscopy    HPI:  Laura Scott is a 56 y.o. female here To schedule five-year high-risk screening colonoscopy. She has 3 first-degree relatives with history of colon polyps, at least one less than the age of 53. Her last colonoscopy was in 2013, was unremarkable.  Patient states that she has issues with constipation over the past couple years since she's been on chronic pain medication. She is being managed through pain clinic. She was given Movantik 12.5 mg daily but states she doesn't take it everyday because of the expense. With a rebate card because $25 a month but she thought she can only use a rebate card once. She has had improved bowel function since she started the medication. Does not go every day. Some stool still hard but less straining. More complete stools. Complains of bloating and gas. She's had some pain in the left upper quadrant region. She has intermittent heartburn and takes Vaprisol just as needed. Over the past several months she's had some issues with dysphagia, initially with pills but also with some solid foods. Feels like food is hanging in the upper esophagus region. Denies melena. Occasionally has bright red blood per rectum intermittently. Patient tells me she has a history of H. pylori, treated in the remote past. Has had prior endoscopy by Dr. Rowe Pavy per her report.    Current Outpatient Prescriptions  Medication Sig Dispense Refill  . ALPRAZolam (XANAX) 0.5 MG tablet TAKE (1) TABLET BY MOUTH EVERY SIX HOURS AS NEEDED. MAY FILL MONTHLY PER MD. 40 tablet 5  . escitalopram (LEXAPRO) 10 MG tablet TAKE ONE TABLET BY MOUTH DAILY. 30 tablet 0  . fluticasone (FLONASE) 50 MCG/ACT nasal spray Place 2 sprays into both nostrils daily. 48 g 1  . gabapentin (NEURONTIN) 300 MG capsule Take 1 capsule (300 mg total) by mouth 3 (three) times  daily. 270 capsule 1  . hydrochlorothiazide (HYDRODIURIL) 25 MG tablet TAKE 1 TABLET BY MOUTH  DAILY AS NEEDED FOR  SWELLING 90 tablet 0  . losartan (COZAAR) 50 MG tablet TAKE 1 TABLET BY MOUTH  DAILY 90 tablet 1  . naloxegol oxalate (MOVANTIK) 12.5 MG TABS tablet Take 12.5 mg by mouth daily. Patient states she takes as needed but not daily due to cost    . naproxen (NAPROSYN) 500 MG tablet TAKE 1 TABLET BY MOUTH  TWICE A DAY WITH MEALS AS  NEEDED FOR PAIN 180 tablet 0  . omeprazole (PRILOSEC) 20 MG capsule Take 1 capsule (20 mg total) by mouth daily. Prn acid reflux 90 capsule 1  . oxyCODONE-acetaminophen (PERCOCET/ROXICET) 5-325 MG tablet Take by mouth every 8 (eight) hours as needed for severe pain.    Marland Kitchen tiZANidine (ZANAFLEX) 4 MG capsule Take 4 mg by mouth.    . zolpidem (AMBIEN) 10 MG tablet TAKE (1) TABLET BY MOUTH AT BEDTIME AS NEEDED. 30 tablet 5   No current facility-administered medications for this visit.     Allergies as of 09/28/2016 - Review Complete 09/28/2016  Allergen Reaction Noted  . Codeine Itching 07/20/2011  . Sulfa antibiotics Hives, Diarrhea, and Itching 07/20/2011  . Other  07/20/2011  . Penicillins Rash 02/19/2013    Past Medical History:  Diagnosis Date  . Allergic rhinitis   . Chronic pain   . Depression   . Hyperlipidemia   . IBS (irritable bowel syndrome)   .  PMS (premenstrual syndrome)   . Reflux     Past Surgical History:  Procedure Laterality Date  . COLONOSCOPY  08/29/2011   [name]: normal. high risk screening tcs in 5 years.   . LUMBAR DISC SURGERY      Family History  Problem Relation Age of Onset  . Hypertension Father   . Diabetes Father   . Hypertension Mother   . Colon polyps Mother   . Hypertension Sister   . Hyperlipidemia Sister   . Colon polyps Brother     Social History   Social History  . Marital status: Divorced    Spouse name: N/A  . Number of children: N/A  . Years of education: N/A   Occupational History  . Not  on file.   Social History Main Topics  . Smoking status: Never Smoker  . Smokeless tobacco: Never Used  . Alcohol use Yes     Comment: occ  . Drug use: No  . Sexual activity: Yes    Birth control/ protection: None   Other Topics Concern  . Not on file   Social History Narrative  . No narrative on file      ROS:  General: Negative for anorexia, weight loss, fever, chills, fatigue, weakness. Eyes: Negative for vision changes.  ENT: Negative for hoarseness,   nasal congestion.see hpi CV: Negative for chest pain, angina, palpitations, dyspnea on exertion, peripheral edema.  Respiratory: Negative for dyspnea at rest, dyspnea on exertion, cough, sputum, wheezing.  GI: See history of present illness. GU:  Negative for dysuria, hematuria, urinary incontinence, urinary frequency, nocturnal urination.  MS: Negative for joint pain, low back pain.  Derm: Negative for rash or itching.  Neuro: Negative for weakness, abnormal sensation, seizure, frequent headaches, memory loss, confusion.  Psych: Negative for anxiety, depression, suicidal ideation, hallucinations.  Endo: Negative for unusual weight change.  Heme: Negative for bruising or bleeding. Allergy: Negative for rash or hives.    Physical Examination:  BP 137/70   Pulse 86   Temp 98.4 F (36.9 C) (Oral)   Ht 5\' 6"  (1.676 m)   Wt 183 lb 12.8 oz (83.4 kg)   BMI 29.67 kg/m    General: Well-nourished, well-developed in no acute distress.  Head: Normocephalic, atraumatic.   Eyes: Conjunctiva pink, no icterus. Mouth: Oropharyngeal mucosa moist and pink , no lesions erythema or exudate. Neck: Supple without thyromegaly, masses, or lymphadenopathy.  Lungs: Clear to auscultation bilaterally.  Heart: Regular rate and rhythm, no murmurs rubs or gallops.  Abdomen: Bowel sounds are normal, mild luq tenderness, nondistended, no hepatosplenomegaly or masses, no abdominal bruits or    hernia , no rebound or guarding.   Rectal:  deferred Extremities: No lower extremity edema. No clubbing or deformities.  Neuro: Alert and oriented x 4 , grossly normal neurologically.  Skin: Warm and dry, no rash or jaundice.   Psych: Alert and cooperative, normal mood and affect.   Impression/plan: 56 year old lady who presents to schedule five-year follow-up colonoscopy. Family history significant for colon polyps and 3 first-degree relatives at least one at a young age. Offered patient diagnostic colonoscopy today, she has intermittent rectal bleeding as well as change in bowel function although this is likely opioid induced constipation. Given polypharmacy and chronic pain medication, plan for deep sedation in the OR.  I have discussed the risks, alternatives, benefits with regards to but not limited to the risk of reaction to medication, bleeding, infection, perforation and the patient is agreeable to proceed. Written  consent to be obtained. Patient has had good results on Movantik for constipation but not using daily due to expense. Advised that rebate cards are good for one year. She had either continue Movantik or we can try and Amitiza 24 g twice a day. Rebate card provided along with prescription. We do not have samples today. She has been advised to try one or the other of these medications and make sure she is having good bowel movements prior to her colonoscopy bowel prep.  Patient also has dysphagia to pills, solid foods. Intermittent heartburn. Left upper quadrant pain likely related constipation cannot exclude upper GI source however. Offered upper endoscopy with dilation in the near future  I have discussed the risks, alternatives, benefits with regards to but not limited to the risk of reaction to medication, bleeding, infection, perforation and the patient is agreeable to proceed. Written consent to be obtained.

## 2016-10-20 NOTE — Discharge Instructions (Addendum)
Increase omeprazole to 40 mg daily  Polyp information provided  Further recommendations to follow pending review of pathology report  Office visit with [place] in 3 months  - Laura Scott - June 1 at 9:00 AM    EGD Discharge instructions Please read the instructions outlined below and refer to this sheet in the next few weeks. These discharge instructions provide you with general information on caring for yourself after you leave the hospital. Your doctor may also give you specific instructions. While your treatment has been planned according to the most current medical practices available, unavoidable complications occasionally occur. If you have any problems or questions after discharge, please call your doctor. ACTIVITY  You may resume your regular activity but move at a slower pace for the next 24 hours.   Take frequent rest periods for the next 24 hours.   Walking will help expel (get rid of) the air and reduce the bloated feeling in your abdomen.   No driving for 24 hours (because of the anesthesia (medicine) used during the test).   You may shower.   Do not sign any important legal documents or operate any machinery for 24 hours (because of the anesthesia used during the test).  NUTRITION  Drink plenty of fluids.   You may resume your normal diet.   Begin with a light meal and progress to your normal diet.   Avoid alcoholic beverages for 24 hours or as instructed by your caregiver.  MEDICATIONS  You may resume your normal medications unless your caregiver tells you otherwise.  WHAT YOU CAN EXPECT TODAY  You may experience abdominal discomfort such as a feeling of fullness or gas pains.  FOLLOW-UP  Your doctor will discuss the results of your test with you.  SEEK IMMEDIATE MEDICAL ATTENTION IF ANY OF THE FOLLOWING OCCUR:  Excessive nausea (feeling sick to your stomach) and/or vomiting.   Severe abdominal pain and distention (swelling).   Trouble swallowing.    Temperature over 101 F (37.8 C).   Rectal bleeding or vomiting of blood.   Colonoscopy Discharge Instructions  Read the instructions outlined below and refer to this sheet in the next few weeks. These discharge instructions provide you with general information on caring for yourself after you leave the hospital. Your doctor may also give you specific instructions. While your treatment has been planned according to the most current medical practices available, unavoidable complications occasionally occur. If you have any problems or questions after discharge, call Dr. Gala Romney at [phone]. ACTIVITY  You may resume your regular activity, but move at a slower pace for the next 24 hours.   Take frequent rest periods for the next 24 hours.   Walking will help get rid of the air and reduce the bloated feeling in your belly (abdomen).   No driving for 24 hours (because of the medicine (anesthesia) used during the test).    Do not sign any important legal documents or operate any machinery for 24 hours (because of the anesthesia used during the test).  NUTRITION  Drink plenty of fluids.   You may resume your normal diet as instructed by your doctor.   Begin with a light meal and progress to your normal diet. Heavy or fried foods are harder to digest and may make you feel sick to your stomach (nauseated).   Avoid alcoholic beverages for 24 hours or as instructed.  MEDICATIONS  You may resume your normal medications unless your doctor tells you otherwise.  WHAT YOU  CAN EXPECT TODAY  Some feelings of bloating in the abdomen.   Passage of more gas than usual.   Spotting of blood in your stool or on the toilet paper.  IF YOU HAD POLYPS REMOVED DURING THE COLONOSCOPY:  No aspirin products for 7 days or as instructed.   No alcohol for 7 days or as instructed.   Eat a soft diet for the next 24 hours.  FINDING OUT THE RESULTS OF YOUR TEST Not all test results are available during your  visit. If your test results are not back during the visit, make an appointment with your caregiver to find out the results. Do not assume everything is normal if you have not heard from your caregiver or the medical facility. It is important for you to follow up on all of your test results.  SEEK IMMEDIATE MEDICAL ATTENTION IF:  You have more than a spotting of blood in your stool.   Your belly is swollen (abdominal distention).   You are nauseated or vomiting.   You have a temperature over 101.   You have abdominal pain or discomfort that is severe or gets worse throughout the day.

## 2016-10-20 NOTE — Interval H&P Note (Signed)
History and Physical Interval Note:  10/20/2016 8:53 AM  Laura Scott  has presented today for surgery, with the diagnosis of family hx colon polyps, rectal bleeding, constipation, LUQ pain, dysphagia  The various methods of treatment have been discussed with the patient and family. After consideration of risks, benefits and other options for treatment, the patient has consented to  Procedure(s) with comments: COLONOSCOPY WITH PROPOFOL (N/A) - 10:45am ESOPHAGOGASTRODUODENOSCOPY (EGD) WITH PROPOFOL (N/A) MALONEY DILATION (N/A) as a surgical intervention .  The patient's history has been reviewed, patient examined, no change in status, stable for surgery.  I have reviewed the patient's chart and labs.  Questions were answered to the patient's satisfaction.     Robert [name]  No change. EGD/ED and high risk screening colonoscopy per plan.  The risks, benefits, limitations, imponderables and alternatives regarding both EGD and colonoscopy have been reviewed with the patient. Questions have been answered. All parties agreeable.

## 2016-10-20 NOTE — Interval H&P Note (Signed)
History and Physical Interval Note:  10/20/2016 10:19 AM  Laura Scott  has presented today for surgery, with the diagnosis of family hx colon polyps, rectal bleeding, constipation, LUQ pain, dysphagia  The various methods of treatment have been discussed with the patient and family. After consideration of risks, benefits and other options for treatment, the patient has consented to  Procedure(s) with comments: COLONOSCOPY WITH PROPOFOL (N/A) - 10:45am ESOPHAGOGASTRODUODENOSCOPY (EGD) WITH PROPOFOL (N/A) MALONEY DILATION (N/A) as a surgical intervention .  The patient's history has been reviewed, patient examined, no change in status, stable for surgery.  I have reviewed the patient's chart and labs.  Questions were answered to the patient's satisfaction.     Manus Rudd

## 2016-10-20 NOTE — Transfer of Care (Signed)
Immediate Anesthesia Transfer of Care Note  Patient: Laura Scott  Procedure(s) Performed: Procedure(s) with comments: COLONOSCOPY WITH PROPOFOL (N/A) - 10:45am ESOPHAGOGASTRODUODENOSCOPY (EGD) WITH PROPOFOL (N/A) MALONEY DILATION (N/A) POLYPECTOMY - cecal and sigmoid polypectomies  Patient Location: PACU  Anesthesia Type:MAC  Level of Consciousness: awake and patient cooperative  Airway & Oxygen Therapy: Patient Spontanous Breathing  Post-op Assessment: Report given to RN and Post -op Vital signs reviewed and stable  Post vital signs: Reviewed and stable  Last Vitals:  Vitals:   10/20/16 1010 10/20/16 1015  BP: 136/70   Pulse:    Resp: (!) 21 (!) 38  Temp:      Last Pain:  Vitals:   10/20/16 0901  TempSrc: Oral      Patients Stated Pain Goal: 7 (94/85/46 2703)  Complications: No apparent anesthesia complications

## 2016-10-20 NOTE — Op Note (Signed)
[facility] Patient Name: Laura Scott Procedure Date: 10/20/2016 10:37 AM MRN: [id number] Date of Birth: 1960/08/24 Attending MD: Norvel Richards , MD CSN: [id number] Age: 56 Admit Type: Outpatient Procedure:                Colonoscopy with biopsy andpolypectomy Indications:              Colon cancer screening in patient at increased                            risk: Family history of colon polyps in multiple                            1st-degree relatives Providers:                Norvel Richards, MD, Lurline Del, RN, [facility], Technician Referring MD:              Medicines:                Propofol per Anesthesia Complications:            No immediate complications. Estimated Blood Loss:     Estimated blood loss was minimal. Procedure:                Pre-Anesthesia Assessment:                           - Prior to the procedure, a History and Physical                            was performed, and patient medications and                            allergies were reviewed. The patient's tolerance of                            previous anesthesia was also reviewed. The risks                            and benefits of the procedure and the sedation                            options and risks were discussed with the patient.                            All questions were answered, and informed consent                            was obtained. Prior Anticoagulants: The patient has                            taken no previous anticoagulant or antiplatelet  agents. ASA Grade Assessment: II - A patient with                            mild systemic disease. After reviewing the risks                            and benefits, the patient was deemed in                            satisfactory condition to undergo the procedure.                           After obtaining informed consent, the colonoscope    was passed under direct vision. Throughout the                            procedure, the patient's blood pressure, pulse, and                            oxygen saturations were monitored continuously. The                            EC-[id number]) scope was introduced through                            the and advanced to the the cecum, identified by                            appendiceal orifice and ileocecal valve. The                            colonoscopy was performed without difficulty. The                            patient tolerated the procedure well. The quality                            of the bowel preparation was adequate. The entire                            colon was well visualized. The ileocecal valve,                            appendiceal orifice, and rectum were photographed. Scope In: 10:39:04 AM Scope Out: 10:53:24 AM Scope Withdrawal Time: 0 hours 8 minutes 52 seconds  Total Procedure Duration: 0 hours 14 minutes 20 seconds  Findings:      The perianal and digital rectal examinations were normal. Melanosis coli       present.      A 2 mm polyp was found in the cecum. The polyp was sessile. The polyp       was removed with a cold biopsy forceps. Resection and retrieval were       complete. Estimated blood loss was minimal.      A 4 mm polyp was  found in the sigmoid colon. The polyp was sessile. The       polyp was removed with a cold snare. Resection and retrieval were       complete. Estimated blood loss was minimal.      A 3 mm polyp was found in the sigmoid colon. The polyp was sessile. The       polyp was removed with a cold biopsy forceps. Resection and retrieval       were complete. Estimated blood loss was minimal.      The exam was otherwise without abnormality on direct and retroflexion       views. Impression:               -Melanosis coli                           One 2 mm polyp in the cecum, removed with a cold                            biopsy  forceps. Resected and retrieved.                           - One 4 mm polyp in the sigmoid colon, removed with                            a cold snare. Resected and retrieved.                           - One 3 mm polyp in the sigmoid colon, removed with                            a cold biopsy forceps. Resected and retrieved.                           - The examination was otherwise normal on direct                            and retroflexion views. Moderate Sedation:      Moderate (conscious) sedation was personally administered by an       anesthesia professional. The following parameters were monitored: oxygen       saturation, heart rate, blood pressure, respiratory rate, EKG, adequacy       of pulmonary ventilation, and response to care. Total physician       intraservice time was 35 minutes. Recommendation:           - Patient has a contact number available for                            emergencies. The signs and symptoms of potential                            delayed complications were discussed with the                            patient. Return to normal activities tomorrow.  Written discharge instructions were provided to the                            patient.                           - Resume previous diet.                           - Continue present medications.                           - Repeat colonoscopy date to be determined after                            pending pathology results are reviewed for                            surveillance based on pathology results.                           - Return to GI office in 3 months. Procedure Code(s):        --- Professional ---                           [phone], Colonoscopy, flexible; with removal of                            tumor(s), polyp(s), or other lesion(s) by snare                            technique                           45380, 15, Colonoscopy, flexible; with biopsy,                             single or multiple Diagnosis Code(s):        --- Professional ---                           Z83.71, Family history of colonic polyps                           D12.0, Benign neoplasm of cecum                           D12.5, Benign neoplasm of sigmoid colon CPT copyright 2016 American Medical Association. All rights reserved. The codes documented in this report are preliminary and upon coder review may  be revised to meet current compliance requirements. Cristopher Estimable. Anabell Swint, MD Norvel Richards, MD 10/20/2016 11:05:21 AM This report has been signed electronically. Number of Addenda: 0

## 2016-10-20 NOTE — Anesthesia Preprocedure Evaluation (Signed)
Anesthesia Evaluation  Patient identified by MRN, date of birth, ID band Patient awake    Reviewed: Allergy & Precautions, NPO status , Patient's Chart, lab work & pertinent test results  Airway Mallampati: III  TM Distance: >3 FB     Dental  (+) Teeth Intact   Pulmonary neg pulmonary ROS,    breath sounds clear to auscultation       Cardiovascular hypertension,  Rhythm:Regular Rate:Normal     Neuro/Psych  Headaches, PSYCHIATRIC DISORDERS Anxiety Depression    GI/Hepatic GERD  ,  Endo/Other  diabetes (new onset? fasting cbg=118), Type 2  Renal/GU      Musculoskeletal   Abdominal   Peds  Hematology   Anesthesia Other Findings   Reproductive/Obstetrics                             Anesthesia Physical Anesthesia Plan  ASA: III  Anesthesia Plan: MAC   Post-op Pain Management:    Induction: Intravenous  Airway Management Planned: Simple Face Mask  Additional Equipment:   Intra-op Plan:   Post-operative Plan:   Informed Consent: I have reviewed the patients History and Physical, chart, labs and discussed the procedure including the risks, benefits and alternatives for the proposed anesthesia with the patient or authorized representative who has indicated his/her understanding and acceptance.     Plan Discussed with:   Anesthesia Plan Comments:         Anesthesia Quick Evaluation

## 2016-10-20 NOTE — Anesthesia Postprocedure Evaluation (Signed)
Anesthesia Post Note  Patient: Laura Scott  Procedure(s) Performed: Procedure(s) (LRB): COLONOSCOPY WITH PROPOFOL (N/A) ESOPHAGOGASTRODUODENOSCOPY (EGD) WITH PROPOFOL (N/A) MALONEY DILATION (N/A) POLYPECTOMY  Patient location during evaluation: PACU Anesthesia Type: MAC Level of consciousness: awake and alert Pain management: satisfactory to patient Vital Signs Assessment: post-procedure vital signs reviewed and stable Respiratory status: spontaneous breathing Cardiovascular status: stable Anesthetic complications: no     Last Vitals:  Vitals:   10/20/16 1105 10/20/16 1121  BP: 126/71 (!) 143/70  Pulse: 86 76  Resp: (!) 23 20  Temp: 36.5 C 36.5 C    Last Pain:  Vitals:   10/20/16 1121  TempSrc: Oral                 Alfreida Steffenhagen

## 2016-10-23 ENCOUNTER — Encounter: Payer: Self-pay | Admitting: Internal Medicine

## 2016-10-25 ENCOUNTER — Encounter: Payer: Self-pay | Admitting: Family Medicine

## 2016-10-25 ENCOUNTER — Ambulatory Visit (INDEPENDENT_AMBULATORY_CARE_PROVIDER_SITE_OTHER): Payer: 59 | Admitting: Family Medicine

## 2016-10-25 VITALS — BP 122/76 | Ht 66.0 in | Wt 185.8 lb

## 2016-10-25 DIAGNOSIS — R7303 Prediabetes: Secondary | ICD-10-CM | POA: Diagnosis not present

## 2016-10-25 DIAGNOSIS — R7301 Impaired fasting glucose: Secondary | ICD-10-CM | POA: Diagnosis not present

## 2016-10-25 DIAGNOSIS — I10 Essential (primary) hypertension: Secondary | ICD-10-CM | POA: Diagnosis not present

## 2016-10-25 DIAGNOSIS — F3341 Major depressive disorder, recurrent, in partial remission: Secondary | ICD-10-CM | POA: Diagnosis not present

## 2016-10-25 LAB — POCT GLYCOSYLATED HEMOGLOBIN (HGB A1C): Hemoglobin A1C: 5.8

## 2016-10-25 MED ORDER — LOSARTAN POTASSIUM 50 MG PO TABS: 50.0000 mg | ORAL_TABLET | Freq: Every day | ORAL | 1 refills | Status: DC

## 2016-10-25 MED ORDER — GABAPENTIN 300 MG PO CAPS: 300.0000 mg | ORAL_CAPSULE | Freq: Three times a day (TID) | ORAL | 1 refills | Status: DC

## 2016-10-25 MED ORDER — HYDROCHLOROTHIAZIDE 25 MG PO TABS: ORAL_TABLET | ORAL | 1 refills | Status: DC

## 2016-10-25 MED ORDER — ALPRAZOLAM 0.5 MG PO TABS: ORAL_TABLET | ORAL | 5 refills | Status: DC

## 2016-10-25 MED ORDER — ZOLPIDEM TARTRATE 10 MG PO TABS: ORAL_TABLET | ORAL | 5 refills | Status: DC

## 2016-10-25 MED ORDER — ESCITALOPRAM OXALATE 10 MG PO TABS: 10.0000 mg | ORAL_TABLET | Freq: Every day | ORAL | 1 refills | Status: DC

## 2016-10-25 MED ORDER — OMEPRAZOLE 40 MG PO CPDR: 40.0000 mg | DELAYED_RELEASE_CAPSULE | Freq: Every day | ORAL | 1 refills | Status: DC

## 2016-10-25 NOTE — Patient Instructions (Signed)
Prediabetes Prediabetes is the condition of having a blood sugar (blood glucose) level that is higher than it should be, but not high enough for you to be diagnosed with type 2 diabetes. Having prediabetes puts you at risk for developing type 2 diabetes (type 2 diabetes mellitus). Prediabetes may be called impaired glucose tolerance or impaired fasting glucose. Prediabetes usually does not cause symptoms. Your health care provider can diagnose this condition with blood tests. You may be tested for prediabetes if you are overweight and if you have at least one other risk factor for prediabetes. Risk factors for prediabetes include:  Having a family member with type 2 diabetes.  Being overweight or obese.  Being older than age 45.  Being of American-Indian, African-American, Hispanic/Latino, or Asian/Pacific Islander descent.  Having an inactive (sedentary) lifestyle.  Having a history of gestational diabetes or polycystic ovarian syndrome (PCOS).  Having low levels of good cholesterol (HDL-C) or high levels of blood fats (triglycerides).  Having high blood pressure. What is blood glucose and how is blood glucose measured?   Blood glucose refers to the amount of glucose in your bloodstream. Glucose comes from eating foods that contain sugars and starches (carbohydrates) that the body breaks down into glucose. Your blood glucose level may be measured in mg/dL (milligrams per deciliter) or mmol/L (millimoles per liter).Your blood glucose may be checked with one or more of the following blood tests:  A fasting blood glucose (FBG) test. You will not be allowed to eat (you will fast) for at least 8 hours before a blood sample is taken.  A normal range for FBG is 70-100 mg/dl (3.9-5.6 mmol/L).  An A1c (hemoglobin A1c) blood test. This test provides information about blood glucose control over the previous 2?3months.  An oral glucose tolerance test (OGTT). This test measures your blood glucose  twice:  After fasting. This is your baseline level.  Two hours after you drink a beverage that contains glucose. You may be diagnosed with prediabetes:  If your FBG is 100?125 mg/dL (5.6-6.9 mmol/L).  If your A1c level is 5.7?6.4%.  If your OGGT result is 140?199 mg/dL (7.8-11 mmol/L). These blood tests may be repeated to confirm your diagnosis. What happens if blood glucose is too high? The pancreas produces a hormone (insulin) that helps move glucose from the bloodstream into cells. When cells in the body do not respond properly to insulin that the body makes (insulin resistance), excess glucose builds up in the blood instead of going into cells. As a result, high blood glucose (hyperglycemia) can develop, which can cause many complications. This is a symptom of prediabetes. What can happen if blood glucose stays higher than normal for a long time? Having high blood glucose for a long time is dangerous. Too much glucose in your blood can damage your nerves and blood vessels. Long-term damage can lead to complications from diabetes, which may include:  Heart disease.  Stroke.  Blindness.  Kidney disease.  Depression.  Poor circulation in the feet and legs, which could lead to surgical removal (amputation) in severe cases. How can prediabetes be prevented from turning into type 2 diabetes?   To help prevent type 2 diabetes, take the following actions:  Be physically active.  Do moderate-intensity physical activity for at least 30 minutes on at least 5 days of the week, or as much as told by your health care provider. This could be brisk walking, biking, or water aerobics.  Ask your health care provider what activities are   safe for you. A mix of physical activities may be best, such as walking, swimming, cycling, and strength training.  Lose weight as told by your health care provider.  Losing 5-7% of your body weight can reverse insulin resistance.  Your health care  provider can determine how much weight loss is best for you and can help you lose weight safely.  Follow a healthy meal plan. This includes eating lean proteins, complex carbohydrates, fresh fruits and vegetables, low-fat dairy products, and healthy fats.  Follow instructions from your health care provider about eating or drinking restrictions.  Make an appointment to see a diet and nutrition specialist (registered dietitian) to help you create a healthy eating plan that is right for you.  Do not smoke or use any tobacco products, such as cigarettes, chewing tobacco, and e-cigarettes. If you need help quitting, ask your health care provider.  Take over-the-counter and prescription medicines as told by your health care provider. You may be prescribed medicines that help lower the risk of type 2 diabetes. This information is not intended to replace advice given to you by your health care provider. Make sure you discuss any questions you have with your health care provider. Document Released: 11/30/2015 Document Revised: 01/14/2016 Document Reviewed: 09/29/2015 Elsevier Interactive Patient Education  2017 Elsevier Inc.  

## 2016-10-25 NOTE — Progress Notes (Signed)
   Subjective:    Patient ID: Laura Scott, female    DOB: 1961-03-31, 56 y.o.   MRN: [id number]  Hypertension  This is a chronic problem. The current episode started more than 1 year ago. Risk factors for coronary artery disease include post-menopausal state. Treatments tried: hctz, cozaar. There are no compliance problems.    Patient states she was told her sugar was high on her preop blood work Was told to increase omeprazole to 40mg  daily by GI doctor and needs prescription  Results for orders placed or performed in visit on 10/25/16  POCT glycosylated hemoglobin (Hb A1C)  Result Value Ref Range   Hemoglobin A1C 5.8    Patient notes ongoing compliance with antidepressant medication. No obvious side effects. Reports does not miss a dose. Overall continues to help depression substantially. No thoughts of homicide or suicide. Would like to maintain medication.  Patient seeing pain clinic for back pain on oxycodone and would like to discuss this also. Take one pain med three times per day.pt had hx of lumbar surgery, further seond opinions shows no benerfit from future surgery  Now goes to pain specialist  Pos diabetes in thr family  Trying to walk and doing some stretching exercises for the bk   Patient had elevated sugar 1 recently seen by a anesthesiologist. Anesthesiologist told her she must have diabetes. Also told her this was all genetic and she cannot do anything about it. Discussed her very upset.   Review of Systems No headache, no major weight loss or weight gain, no chest pain no back pain abdominal pain no change in bowel habits complete ROS otherwise negative     Objective:   Physical Exam  Alert vitals stable, NAD. Blood pressure good on repeat. HEENT normal. Lungs clear. Heart regular rate and rhythm.       Assessment & Plan:  Impression 1 hypertension discussed good control maintain same meds #2 chronic pain followed primarily by the chronic pain  specialist. They give her narcotics. Due to have recovered with Neurontin which helped. #3 depression/anxiety is claims clinically stable. States medicine definitely helps her 4 insomnia on: Chronic need for meds #5 prediabetes discuss A1c elevated but improving diabetes level. Importance of weight loss exercise diet discussed at length. All medications refilled. Diet exercise discussed. Follow-up every 6 months

## 2016-10-26 DIAGNOSIS — R7303 Prediabetes: Secondary | ICD-10-CM | POA: Insufficient documentation

## 2016-10-27 ENCOUNTER — Telehealth: Payer: Self-pay | Admitting: Internal Medicine

## 2016-10-27 NOTE — Telephone Encounter (Signed)
[phone] PLEASE CALL PATIENT ABOUT HER PROCEDURE SHE HAD LAST WEEK.  EXPERIENCING THROAT SORENESS

## 2016-10-27 NOTE — Telephone Encounter (Signed)
Tried to call pt- NA- LMOM 

## 2016-10-28 NOTE — Telephone Encounter (Signed)
Tried to call pt- LMOM 

## 2016-10-31 ENCOUNTER — Encounter (HOSPITAL_COMMUNITY): Payer: Self-pay | Admitting: Internal Medicine

## 2016-11-07 ENCOUNTER — Other Ambulatory Visit: Payer: Self-pay | Admitting: Nurse Practitioner

## 2016-11-09 NOTE — Telephone Encounter (Signed)
Tried to call pt- NA- LMOM 

## 2016-11-14 NOTE — Telephone Encounter (Signed)
Mailed a letter to the patient, asking her to call the office if she is still having problems.

## 2017-01-20 ENCOUNTER — Ambulatory Visit: Payer: 59 | Admitting: Gastroenterology

## 2017-02-02 ENCOUNTER — Encounter: Payer: Self-pay | Admitting: Family Medicine

## 2017-02-02 ENCOUNTER — Ambulatory Visit (INDEPENDENT_AMBULATORY_CARE_PROVIDER_SITE_OTHER): Payer: BLUE CROSS/BLUE SHIELD | Admitting: Family Medicine

## 2017-02-02 VITALS — BP 128/76 | Ht 66.0 in | Wt 181.0 lb

## 2017-02-02 DIAGNOSIS — R609 Edema, unspecified: Secondary | ICD-10-CM

## 2017-02-02 DIAGNOSIS — M25561 Pain in right knee: Secondary | ICD-10-CM

## 2017-02-02 DIAGNOSIS — Z79899 Other long term (current) drug therapy: Secondary | ICD-10-CM | POA: Diagnosis not present

## 2017-02-02 DIAGNOSIS — I10 Essential (primary) hypertension: Secondary | ICD-10-CM

## 2017-02-02 NOTE — Progress Notes (Signed)
   Subjective:    Patient ID: Laura Scott, female    DOB: 01-14-1961, 56 y.o.   MRN: [id number] Patient arrives office with chronic concerns multiple. HPIright leg pain and swelling and pain.   bilateral foot swelling. Started 2 months.   Right knee swollen for the past wtwo months, sharp with certain motions. Recalls no sudden injury.  Concerned about swelling of the ankles. Progressive over the last few months. Complete review of records in presence of patient shows this was an issue several years ago. One of the reasons patient is on hydrochlorothiazide.  Patient is also on gabapentin higher dosage months  Oxycodone several times per day for choronic bck pain , currently working on disability status.  No worsening of shortness of breath. Realizes she is deconditioned currently due to not being able to exercise or work Review of Systems No headache, no major weight loss or weight gain, no chest pain no back pain abdominal pain no change in bowel habits complete ROS otherwise negative     Objective:   Physical Exam Alert and oriented, vitals reviewed and stable, NAD ENT-TM's and ext canals WNL bilat via otoscopic exam Soft palate, tonsils and post pharynx WNL via oropharyngeal exam Neck-symmetric, no masses; thyroid nonpalpable and nontender Pulmonary-no tachypnea or accessory muscle use; Clear without wheezes via auscultation Card--no abnrml murmurs, rhythm reg and rate WNL Carotid pulses symmetric, without bruits Right knee mild effusion noted some crepitations with extension negative Homans sign 11+ edema both ankles       Assessment & Plan:  Impression probable venous stasis both legs discussed #2 worsening right knee pain with intermittent swelling likely arthritis. However with leg swelling more will also evaluate for potential DVT which is extremely unlikely I would like to do a screening ultrasound along with x-ray. Plan knee x-ray. Leg ultrasound. Encouraged to down  salt intake exercises were possible. Prefer holding off on increasing diuretic rationale discussed with patient. Higher dose gabapentin may also be contributing

## 2017-02-03 ENCOUNTER — Ambulatory Visit (HOSPITAL_COMMUNITY)
Admission: RE | Admit: 2017-02-03 | Discharge: 2017-02-03 | Disposition: A | Discharge status: Home / Self Care | Payer: BLUE CROSS/BLUE SHIELD | Source: Ambulatory Visit | Attending: Family Medicine | Admitting: Family Medicine

## 2017-02-03 ENCOUNTER — Ambulatory Visit (HOSPITAL_COMMUNITY)
Admission: RE | Admit: 2017-02-03 | Discharge: 2017-02-03 | Disposition: A | Discharge status: Home / Self Care | Payer: BLUE CROSS/BLUE SHIELD | Source: Ambulatory Visit | Attending: Nurse Practitioner | Admitting: Nurse Practitioner

## 2017-02-03 DIAGNOSIS — M25561 Pain in right knee: Secondary | ICD-10-CM | POA: Insufficient documentation

## 2017-02-03 DIAGNOSIS — R609 Edema, unspecified: Secondary | ICD-10-CM | POA: Diagnosis not present

## 2017-02-03 DIAGNOSIS — M25461 Effusion, right knee: Secondary | ICD-10-CM | POA: Insufficient documentation

## 2017-02-04 LAB — BASIC METABOLIC PANEL
BUN/Creatinine Ratio: 14 (ref 9–23)
BUN: 11 mg/dL (ref 6–24)
CO2: 26 mmol/L (ref 20–29)
Calcium: 10 mg/dL (ref 8.7–10.2)
Chloride: 101 mmol/L (ref 96–106)
Creatinine, Ser: 0.78 mg/dL (ref 0.57–1.00)
GFR calc Af Amer: 99 mL/min/{1.73_m2} (ref >59)
GFR calc non Af Amer: 86 mL/min/{1.73_m2} (ref >59)
Glucose: 72 mg/dL (ref 65–99)
Potassium: 4.2 mmol/L (ref 3.5–5.2)
Sodium: 143 mmol/L (ref 134–144)

## 2017-02-04 LAB — HEPATIC FUNCTION PANEL
ALT: 68 IU/L — ABNORMAL HIGH (ref 0–32)
AST: 62 IU/L — ABNORMAL HIGH (ref 0–40)
Albumin: 4.7 g/dL (ref 3.5–5.5)
Alkaline Phosphatase: 100 IU/L (ref 39–117)
Bilirubin Total: 0.3 mg/dL (ref 0.0–1.2)
Bilirubin, Direct: 0.1 mg/dL (ref 0.00–0.40)
Total Protein: 7.8 g/dL (ref 6.0–8.5)

## 2017-02-04 LAB — BRAIN NATRIURETIC PEPTIDE: BNP: 8.8 pg/mL (ref 0.0–100.0)

## 2017-02-07 ENCOUNTER — Encounter: Payer: Self-pay | Admitting: Family Medicine

## 2017-02-07 ENCOUNTER — Ambulatory Visit (INDEPENDENT_AMBULATORY_CARE_PROVIDER_SITE_OTHER): Payer: BLUE CROSS/BLUE SHIELD | Admitting: Family Medicine

## 2017-02-07 VITALS — BP 140/80 | Ht 66.0 in | Wt 176.4 lb

## 2017-02-07 DIAGNOSIS — Z79899 Other long term (current) drug therapy: Secondary | ICD-10-CM

## 2017-02-07 DIAGNOSIS — I10 Essential (primary) hypertension: Secondary | ICD-10-CM | POA: Diagnosis not present

## 2017-02-07 DIAGNOSIS — M25561 Pain in right knee: Secondary | ICD-10-CM

## 2017-02-07 DIAGNOSIS — R609 Edema, unspecified: Secondary | ICD-10-CM

## 2017-02-07 NOTE — Progress Notes (Signed)
   Subjective:    Patient ID: Laura Scott, female    DOB: Apr 02, 1961, 56 y.o.   MRN: [id number]  atent arrives office for follow- dscssionith utiple concens HPI Patient in today for a follow up on ultrasound if lower right leg that was completed on 02/03/2017.   Still has c/o of swelling to right knee, x-ay revealed exophyte with prepatellar effusion.    legUltrasound revealed no evidence of DVT  Extensive blood work revealed normal renal function normal cardiac function. Liver enzymes were mildly elevated. Next  On further history positive alcohol use increased somewhat in the last year. Drinking a fair amount of beer numerpous evenings    Patient states no other concerns this visit.   Review of Systems No headache, no major weight loss or weight gain, no chest pain no back pain abdominal pain no change in bowel habits complete ROS otherwise negative     Objective:   Physical Exam Alert and oriented, vitals reviewed and stable, NAD ENT-TM's and ext canals WNL bilat via otoscopic exam Soft palate, tonsils and post pharynx WNL via oropharyngeal exam Neck-symmetric, no masses; thyroid nonpalpable and nontender Pulmonary-no tachypnea or accessory muscle use; Clear without wheezes via auscultation Card--no abnrml murmurs, rhythm reg and rate WNL Carotid pulses symmetric, without bruits Ankles trace edema       Assessment & Plan:  Impression 1 peripheral edema likely secondary to gabapentin plus venous stasis discussed at length #2 right knee arthritis discuss including interventional methods #3 gabapentin use discussed benefits still outweighs negative #4 elevated liver enzymes discuss patient to cut alcohol intake at least in half and recheck again in several months. Follow-up as scheduled diet exercise discussed  Greater than 50% of this 25 minute face to face visit was spent in counseling and discussion and coordination of care regarding the above diagnosis/diagnosies

## 2017-03-02 ENCOUNTER — Ambulatory Visit (INDEPENDENT_AMBULATORY_CARE_PROVIDER_SITE_OTHER): Payer: BLUE CROSS/BLUE SHIELD | Admitting: Nurse Practitioner

## 2017-03-02 ENCOUNTER — Encounter: Payer: Self-pay | Admitting: Nurse Practitioner

## 2017-03-02 VITALS — BP 154/80 | Temp 99.2°F | Ht 66.0 in | Wt 180.0 lb

## 2017-03-02 DIAGNOSIS — R509 Fever, unspecified: Secondary | ICD-10-CM

## 2017-03-02 DIAGNOSIS — J0100 Acute maxillary sinusitis, unspecified: Secondary | ICD-10-CM

## 2017-03-02 MED ORDER — METHYLPREDNISOLONE ACETATE 40 MG/ML IJ SUSP
40.0000 mg | Freq: Once | INTRAMUSCULAR | Status: AC
  Administered 2017-03-02: 40 mg via INTRAMUSCULAR

## 2017-03-02 MED ORDER — LEVOFLOXACIN 500 MG PO TABS: 500.0000 mg | ORAL_TABLET | Freq: Every day | ORAL | 0 refills | Status: DC

## 2017-03-03 ENCOUNTER — Encounter: Payer: Self-pay | Admitting: Nurse Practitioner

## 2017-03-03 NOTE — Progress Notes (Signed)
Subjective:  Presents complaints of sinus symptoms over the past 4 days. Low-grade fever for the past 2 days. Sore throat. Maxillary area headache. Runny nose. Occasional cough producing green mucus. Minimal wheezing. Some ear pain. Hoarseness. Has taken Mucinex D for her symptoms.  Objective:   BP (!) 154/80   Temp 99.2 F (37.3 C) (Oral)   Ht 5\' 6"  (1.676 m)   Wt 180 lb 0.4 oz (81.7 kg)   LMP 09/03/2013   BMI 29.06 kg/m  NAD. Alert, oriented. TMs retracted bilateral, no erythema. Pharynx mildly erythematous with PND noted. Neck supple with mild soft anterior adenopathy. Lungs clear. Heart regular rate rhythm.  Assessment:  Acute non-recurrent maxillary sinusitis - Plan: methylPREDNISolone acetate (DEPO-MEDROL) injection 40 mg    Plan:   Meds ordered this encounter  Medications  . levofloxacin (LEVAQUIN) 500 MG tablet    Sig: Take 1 tablet (500 mg total) by mouth daily.    Dispense:  10 tablet    Refill:  0    Order Specific Question:   Supervising Provider    Answer:   Mikey Kirschner [[id number]]  . methylPREDNISolone acetate (DEPO-MEDROL) injection 40 mg   Avoid use of Mucinex D if possible due to impact on her blood pressure. OTC meds as directed. Call back in 7-10 days if no improvement, sooner if worse.

## 2017-03-13 ENCOUNTER — Telehealth: Payer: Self-pay | Admitting: Family Medicine

## 2017-03-13 DIAGNOSIS — M1711 Unilateral primary osteoarthritis, right knee: Secondary | ICD-10-CM

## 2017-03-13 NOTE — Telephone Encounter (Signed)
Spoke with patient and informed her per Dr.Steve Nowthen for right knee arthritis. Patient verbalized understanding.

## 2017-03-13 NOTE — Telephone Encounter (Signed)
Pt called stating that she is now hurting in both legs and is wanting to go ahead with the referral to the arthritis Dr. Parks Ranger was mentioned at her last appt.

## 2017-03-13 NOTE — Telephone Encounter (Signed)
Orthopedist for right knee arthritis

## 2017-03-15 ENCOUNTER — Emergency Department (HOSPITAL_COMMUNITY)
Admission: EM | Admit: 2017-03-15 | Discharge: 2017-03-15 | Disposition: A | Discharge status: Home / Self Care | Payer: BLUE CROSS/BLUE SHIELD | Attending: Emergency Medicine | Admitting: Emergency Medicine

## 2017-03-15 ENCOUNTER — Telehealth: Payer: Self-pay | Admitting: Family Medicine

## 2017-03-15 ENCOUNTER — Encounter (HOSPITAL_COMMUNITY): Payer: Self-pay | Admitting: Emergency Medicine

## 2017-03-15 ENCOUNTER — Emergency Department (HOSPITAL_COMMUNITY): Payer: BLUE CROSS/BLUE SHIELD

## 2017-03-15 DIAGNOSIS — Z79899 Other long term (current) drug therapy: Secondary | ICD-10-CM | POA: Insufficient documentation

## 2017-03-15 DIAGNOSIS — M25561 Pain in right knee: Secondary | ICD-10-CM | POA: Insufficient documentation

## 2017-03-15 DIAGNOSIS — I10 Essential (primary) hypertension: Secondary | ICD-10-CM | POA: Diagnosis not present

## 2017-03-15 LAB — BASIC METABOLIC PANEL
Anion gap: 8 (ref 5–15)
BUN: 13 mg/dL (ref 6–20)
CO2: 30 mmol/L (ref 22–32)
Calcium: 9 mg/dL (ref 8.9–10.3)
Chloride: 99 mmol/L — ABNORMAL LOW (ref 101–111)
Creatinine, Ser: 0.76 mg/dL (ref 0.44–1.00)
GFR calc Af Amer: >60 mL/min (ref >60)
GFR calc non Af Amer: >60 mL/min (ref >60)
Glucose, Bld: 118 mg/dL — ABNORMAL HIGH (ref 65–99)
Potassium: 3.8 mmol/L (ref 3.5–5.1)
Sodium: 137 mmol/L (ref 135–145)

## 2017-03-15 LAB — CBC WITH DIFFERENTIAL/PLATELET
Basophils Absolute: 0 10*3/uL (ref 0.0–0.1)
Basophils Relative: 0 %
Eosinophils Absolute: 0.1 10*3/uL (ref 0.0–0.7)
Eosinophils Relative: 2 %
HCT: 35.1 % — ABNORMAL LOW (ref 36.0–46.0)
Hemoglobin: 12 g/dL (ref 12.0–15.0)
Lymphocytes Relative: 20 %
Lymphs Abs: 1.5 10*3/uL (ref 0.7–4.0)
MCH: 33 pg (ref 26.0–34.0)
MCHC: 34.2 g/dL (ref 30.0–36.0)
MCV: 96.4 fL (ref 78.0–100.0)
Monocytes Absolute: 0.6 10*3/uL (ref 0.1–1.0)
Monocytes Relative: 8 %
Neutro Abs: 5.3 10*3/uL (ref 1.7–7.7)
Neutrophils Relative %: 70 %
Platelets: 297 10*3/uL (ref 150–400)
RBC: 3.64 MIL/uL — ABNORMAL LOW (ref 3.87–5.11)
RDW: 11.9 % (ref 11.5–15.5)
WBC: 7.5 10*3/uL (ref 4.0–10.5)

## 2017-03-15 MED ORDER — PREDNISONE 10 MG PO TABS: 20.0000 mg | ORAL_TABLET | Freq: Every day | ORAL | 0 refills | Status: DC

## 2017-03-15 NOTE — Discharge Instructions (Signed)
Call and make an appointment with Dr. Aline Brochure in 1-2 weeks

## 2017-03-15 NOTE — ED Triage Notes (Signed)
Pt reports bilateral leg swelling for the past 2-3 weeks.  Saw pcp for same and told needed referral for "arthritis specialist."

## 2017-03-15 NOTE — Telephone Encounter (Signed)
Pt called stating that her leg is swollen and very painful and wants to know what she can do till she is able to see the orthopedist. Please advise.

## 2017-03-15 NOTE — ED Provider Notes (Signed)
[facility] DEPT Provider Note   CSN: [id number] Arrival date & time: 03/15/17  1221     History   Chief Complaint Chief Complaint  Patient presents with  . Leg Swelling    HPI Laura Scott is a 56 y.o. female.  Patient complains of pain in her right knee. She also complains of swelling.    Leg Pain   This is a new problem. The current episode started more than 2 days ago. The problem occurs constantly. The problem has not changed since onset.The pain is present in the right knee. The quality of the pain is described as aching. The pain is at a severity of 5/10. The pain is moderate. Associated symptoms include limited range of motion. The symptoms are aggravated by standing. She has tried arthritis medications for the symptoms. There has been no history of extremity trauma.    Past Medical History:  Diagnosis Date  . Allergic rhinitis   . Anxiety   . Chronic pain   . Depression   . GERD (gastroesophageal reflux disease)   . Headache   . Hyperlipidemia   . Hypertension   . IBS (irritable bowel syndrome)   . PMS (premenstrual syndrome)   . Reflux     Patient Active Problem List   Diagnosis Date Noted  . Prediabetes 10/26/2016  . LUQ pain 09/28/2016  . Rectal bleeding 09/28/2016  . Constipation 09/28/2016  . FHx: colonic polyps 09/28/2016  . Dysphagia 09/28/2016  . Anxiety 05/11/2015  . Clinical depression 05/11/2015  . Essential hypertension 05/11/2015  . Reflux 05/11/2015  . Lumbar spondylosis 03/26/2014  . Impaired fasting glucose 10/29/2013  . Arthritis 10/29/2013  . Chronic back pain 04/11/2013  . Insomnia 04/11/2013    Past Surgical History:  Procedure Laterality Date  . COLONOSCOPY  08/29/2011   [name]: normal. high risk screening tcs in 5 years.   . COLONOSCOPY WITH PROPOFOL N/A 10/20/2016   Procedure: COLONOSCOPY WITH PROPOFOL;  Surgeon: Daneil Dolin, MD;  [facility];  Service: Endoscopy;  Laterality: N/A;  10:45am  .  ESOPHAGOGASTRODUODENOSCOPY (EGD) WITH PROPOFOL N/A 10/20/2016   Procedure: ESOPHAGOGASTRODUODENOSCOPY (EGD) WITH PROPOFOL;  Surgeon: Daneil Dolin, MD;  Location: [facility];  Service: Endoscopy;  Laterality: N/A;  . LUMBAR [place]    . MALONEY DILATION N/A 10/20/2016   Procedure: Venia Minks DILATION;  Surgeon: Daneil Dolin, MD;  Location: [facility];  Service: Endoscopy;  Laterality: N/A;  . POLYPECTOMY  10/20/2016   Procedure: POLYPECTOMY;  Surgeon: Daneil Dolin, MD;  Location: [facility];  Service: Endoscopy;;  cecal and sigmoid polypectomies    OB History    No data available       Home Medications    Prior to Admission medications   Medication Sig Start Date End Date Taking? Authorizing Provider  ALPRAZolam (XANAX) 0.5 MG tablet TAKE (1) TABLET BY MOUTH EVERY SIX HOURS AS NEEDED. MAY FILL MONTHLY PER MD. 10/25/16  Yes Mikey Kirschner, MD  Cyanocobalamin (VITAMIN B-12) 5000 MCG SUBL Place 1 tablet under the tongue daily.   Yes [provider]  cyclobenzaprine (FLEXERIL) 10 MG tablet  01/05/17  Yes [provider]  escitalopram (LEXAPRO) 10 MG tablet TAKE ONE TABLET BY MOUTH DAILY. 11/08/16  Yes Nilda Simmer, NP  fluticasone (FLONASE) 50 MCG/ACT nasal spray USE 2 SPRAYS IN EACH  NOSTRIL DAILY 09/28/16  Yes Nilda Simmer, NP  gabapentin (NEURONTIN) 300 MG capsule Take 1 capsule (300 mg total)  by mouth 3 (three) times daily. 10/25/16  Yes Mikey Kirschner, MD  hydrochlorothiazide (HYDRODIURIL) 25 MG tablet TAKE 1 TABLET BY MOUTH  DAILY AS NEEDED FOR  SWELLING 10/25/16  Yes Mikey Kirschner, MD  lidocaine (LIDODERM) 5 % Place 1 patch onto the skin daily as needed. Remove & Discard patch within 12 hours or as directed by MD   Yes [provider]  loratadine (CLARITIN) 10 MG tablet Take 10 mg by mouth daily.   Yes [provider]  losartan (COZAAR) 50 MG tablet Take 1 tablet (50 mg total) by mouth daily. 10/25/16  Yes Mikey Kirschner, MD    lubiprostone (AMITIZA) 24 MCG capsule Take 1 capsule (24 mcg total) by mouth 2 (two) times daily with a meal. DO NOT TAKE WITH MOVANTIK 09/28/16  Yes Mahala Menghini, PA-C  naloxegol oxalate (MOVANTIK) 12.5 MG TABS tablet Take 12.5 mg by mouth daily as needed. Patient states she takes as needed but not daily due to cost    Yes [provider]  naproxen (NAPROSYN) 500 MG tablet TAKE 1 TABLET BY MOUTH  TWICE A DAY WITH MEALS AS  NEEDED FOR PAIN 09/19/16  Yes Nilda Simmer, NP  omeprazole (PRILOSEC) 40 MG capsule Take 1 capsule (40 mg total) by mouth daily. 10/25/16  Yes Mikey Kirschner, MD  oxyCODONE-acetaminophen (PERCOCET/ROXICET) 5-325 MG tablet Take 1 tablet by mouth every 8 (eight) hours as needed for severe pain.    Yes [provider]  Pyridoxine HCl (VITAMIN B-6) 250 MG tablet Take 250 mg by mouth daily.   Yes [provider]  zolpidem (AMBIEN) 10 MG tablet TAKE (1) TABLET BY MOUTH AT BEDTIME AS NEEDED. 10/25/16  Yes Mikey Kirschner, MD  predniSONE (DELTASONE) 10 MG tablet Take 2 tablets (20 mg total) by mouth daily. 03/15/17   Milton Ferguson, MD    Family History Family History  Problem Relation Age of Onset  . Hypertension Father   . Diabetes Father   . Hypertension Mother   . Colon polyps Mother   . Hypertension Sister   . Hyperlipidemia Sister   . Colon polyps Brother     Social History Social History  Substance Use Topics  . Smoking status: Never Smoker  . Smokeless tobacco: Never Used  . Alcohol use Yes     Comment: occ     Allergies   Codeine; Sulfa antibiotics; Other; and Penicillins   Review of Systems Review of Systems  Constitutional: Negative for appetite change and fatigue.  HENT: Negative for congestion, ear discharge and sinus pressure.   Eyes: Negative for discharge.  Respiratory: Negative for cough.   Cardiovascular: Negative for chest pain.  Gastrointestinal: Negative for abdominal pain and diarrhea.  Genitourinary:  Negative for frequency and hematuria.  Musculoskeletal: Negative for back pain.       Right knee pain  Skin: Negative for rash.  Neurological: Negative for seizures and headaches.  Psychiatric/Behavioral: Negative for hallucinations.     Physical Exam Updated Vital Signs BP (!) 144/58   Pulse (!) 101   Temp 98.2 F (36.8 C) (Oral)   Resp 18   Ht 5\' 6"  (1.676 m)   Wt 81.6 kg (180 lb)   LMP 09/03/2013   SpO2 100%   BMI 29.05 kg/m   Physical Exam  Constitutional: She is oriented to person, place, and time. She appears well-developed.  HENT:  Head: Normocephalic.  Eyes: Conjunctivae are normal.  Neck: No tracheal deviation present.  Cardiovascular:  No murmur heard. Musculoskeletal:  Mild swelling and tenderness to posterior right knee  Neurological: She is oriented to person, place, and time.  Skin: Skin is warm.  Psychiatric: She has a normal mood and affect.     ED Treatments / Results  Labs (all labs ordered are listed, but only abnormal results are displayed) Labs Reviewed  CBC WITH DIFFERENTIAL/PLATELET - Abnormal; Notable for the following:       Result Value   RBC 3.64 (*)    HCT 35.1 (*)    All other components within normal limits  BASIC METABOLIC PANEL - Abnormal; Notable for the following:    Chloride 99 (*)    Glucose, Bld 118 (*)    All other components within normal limits    EKG  EKG Interpretation None       Radiology US Venous Img Lower Unilateral Right  Result Date: 03/15/2017 CLINICAL DATA:  Right lower extremity pain and swelling for 2 days EXAM: RIGHT LOWER EXTREMITY VENOUS DOPPLER ULTRASOUND TECHNIQUE: Gray-scale sonography with graded compression, as well as color Doppler and duplex ultrasound were performed to evaluate the lower extremity deep venous systems from the level of the common femoral vein and including the common femoral, femoral, profunda femoral, popliteal and calf veins including the posterior tibial, peroneal and  gastrocnemius veins when visible. The superficial great saphenous vein was also interrogated. Spectral Doppler was utilized to evaluate flow at rest and with distal augmentation maneuvers in the common femoral, femoral and popliteal veins. COMPARISON:  None. FINDINGS: Contralateral Common Femoral Vein: Respiratory phasicity is normal and symmetric with the symptomatic side. No evidence of thrombus. Normal compressibility. Common Femoral Vein: No evidence of thrombus. Normal compressibility, respiratory phasicity and response to augmentation. Saphenofemoral Junction: No evidence of thrombus. Normal compressibility and flow on color Doppler imaging. Profunda Femoral Vein: No evidence of thrombus. Normal compressibility and flow on color Doppler imaging. Femoral Vein: No evidence of thrombus. Normal compressibility, respiratory phasicity and response to augmentation. Popliteal Vein: No evidence of thrombus. Normal compressibility, respiratory phasicity and response to augmentation. Calf Veins: No evidence of thrombus. Normal compressibility and flow on color Doppler imaging. Superficial Great Saphenous Vein: No evidence of thrombus. Normal compressibility and flow on color Doppler imaging. Venous Reflux:  None. Other Findings: Subcutaneous edema is noted within the calf. Small joint effusion is seen in the knee joint. IMPRESSION: No evidence of DVT within the right lower extremity. Mild calf edema and small right knee joint effusion Electronically Signed   By: Inez Catalina M.D.   On: 03/15/2017 14:07    Procedures Procedures (including critical care time)  Medications Ordered in ED Medications - No data to display   Initial Impression / Assessment and Plan / ED Course  I have reviewed the triage vital signs and the nursing notes.  Pertinent labs & imaging results that were available during my care of the patient were reviewed by me and considered in my medical decision making (see chart for details).       Patient with inflammation to right knee. She will be put on prednisone for a week and referred to orthopedics Final Clinical Impressions(s) / ED Diagnoses   Final diagnoses:  Acute pain of right knee    New Prescriptions New Prescriptions   PREDNISONE (DELTASONE) 10 MG TABLET    Take 2 tablets (20 mg total) by mouth daily.     Milton Ferguson, MD 03/15/17 1459

## 2017-03-16 ENCOUNTER — Encounter: Payer: Self-pay | Admitting: Orthopaedic Surgery

## 2017-03-16 ENCOUNTER — Ambulatory Visit (INDEPENDENT_AMBULATORY_CARE_PROVIDER_SITE_OTHER): Payer: BLUE CROSS/BLUE SHIELD | Admitting: Orthopaedic Surgery

## 2017-03-16 VITALS — BP 145/87 | HR 96 | Temp 97.9°F | Ht 66.0 in | Wt 185.0 lb

## 2017-03-16 DIAGNOSIS — M25561 Pain in right knee: Secondary | ICD-10-CM

## 2017-03-16 DIAGNOSIS — G8929 Other chronic pain: Secondary | ICD-10-CM

## 2017-03-16 DIAGNOSIS — G894 Chronic pain syndrome: Secondary | ICD-10-CM | POA: Diagnosis not present

## 2017-03-16 NOTE — Progress Notes (Signed)
Subjective:    Patient ID: Laura Scott, female    DOB: Aug 11, 1961, 56 y.o.   MRN: [id number]  HPI She has pain of the right knee since mid May.  She has swelling and popping.  Over the last few weeks it has gotten worse.  She has no trauma.  She was unable to fully extend the knee yesterday and went to the ER.  She was examined.  She had Doppler which was negative.  She has giving way now.  Nothing seems to help.  She has no locking.  She was referred here.  I have reviewed the ER notes, the x-rays and x-ray reports.  She also had a knee x-ray done 02-03-17 which showed some DJD changes, nothing acute.  I am concerned about a meniscus tear and inability to extend the knee. I will get a MRI.   Review of Systems  HENT: Negative for congestion.   Respiratory: Negative for cough and shortness of breath.   Cardiovascular: Negative for chest pain and leg swelling.  Endocrine: Positive for cold intolerance.  Musculoskeletal: Positive for arthralgias, gait problem, joint swelling and myalgias.  Allergic/Immunologic: Positive for environmental allergies.  Psychiatric/Behavioral: The patient is nervous/anxious.    Past Medical History:  Diagnosis Date  . Allergic rhinitis   . Anxiety   . Chronic pain   . Depression   . GERD (gastroesophageal reflux disease)   . Headache   . Hyperlipidemia   . Hypertension   . IBS (irritable bowel syndrome)   . PMS (premenstrual syndrome)   . Reflux     Past Surgical History:  Procedure Laterality Date  . COLONOSCOPY  08/29/2011   [name]: normal. high risk screening tcs in 5 years.   . COLONOSCOPY WITH PROPOFOL N/A 10/20/2016   Procedure: COLONOSCOPY WITH PROPOFOL;  Surgeon: Daneil Dolin, MD;  [facility];  Service: Endoscopy;  Laterality: N/A;  10:45am  . ESOPHAGOGASTRODUODENOSCOPY (EGD) WITH PROPOFOL N/A 10/20/2016   Procedure: ESOPHAGOGASTRODUODENOSCOPY (EGD) WITH PROPOFOL;  Surgeon: Daneil Dolin, MD;  [facility];   Service: Endoscopy;  Laterality: N/A;  . LUMBAR [place]    . MALONEY DILATION N/A 10/20/2016   Procedure: Venia Minks DILATION;  Surgeon: Daneil Dolin, MD;  [facility];  Service: Endoscopy;  Laterality: N/A;  . POLYPECTOMY  10/20/2016   Procedure: POLYPECTOMY;  Surgeon: Daneil Dolin, MD;  [facility];  Service: Endoscopy;;  cecal and sigmoid polypectomies    Current Outpatient Prescriptions on File Prior to Visit  Medication Sig Dispense Refill  . ALPRAZolam (XANAX) 0.5 MG tablet TAKE (1) TABLET BY MOUTH EVERY SIX HOURS AS NEEDED. MAY FILL MONTHLY PER MD. 40 tablet 5  . Cyanocobalamin (VITAMIN B-12) 5000 MCG SUBL Place 1 tablet under the tongue daily.    . cyclobenzaprine (FLEXERIL) 10 MG tablet   1  . escitalopram (LEXAPRO) 10 MG tablet TAKE ONE TABLET BY MOUTH DAILY. 30 tablet 5  . fluticasone (FLONASE) 50 MCG/ACT nasal spray USE 2 SPRAYS IN EACH  NOSTRIL DAILY 48 g 0  . gabapentin (NEURONTIN) 300 MG capsule Take 1 capsule (300 mg total) by mouth 3 (three) times daily. 270 capsule 1  . hydrochlorothiazide (HYDRODIURIL) 25 MG tablet TAKE 1 TABLET BY MOUTH  DAILY AS NEEDED FOR  SWELLING 90 tablet 1  . lidocaine (LIDODERM) 5 % Place 1 patch onto the skin daily as needed. Remove & Discard patch within 12 hours or as directed by MD    .  loratadine (CLARITIN) 10 MG tablet Take 10 mg by mouth daily.    Marland Kitchen losartan (COZAAR) 50 MG tablet Take 1 tablet (50 mg total) by mouth daily. 90 tablet 1  . lubiprostone (AMITIZA) 24 MCG capsule Take 1 capsule (24 mcg total) by mouth 2 (two) times daily with a meal. DO NOT TAKE WITH MOVANTIK 60 capsule 3  . naloxegol oxalate (MOVANTIK) 12.5 MG TABS tablet Take 12.5 mg by mouth daily as needed. Patient states she takes as needed but not daily due to cost     . naproxen (NAPROSYN) 500 MG tablet TAKE 1 TABLET BY MOUTH  TWICE A DAY WITH MEALS AS  NEEDED FOR PAIN 180 tablet 0  . omeprazole (PRILOSEC) 40 MG capsule Take 1 capsule (40 mg total)  by mouth daily. 90 capsule 1  . oxyCODONE-acetaminophen (PERCOCET/ROXICET) 5-325 MG tablet Take 1 tablet by mouth every 8 (eight) hours as needed for severe pain.     . predniSONE (DELTASONE) 10 MG tablet Take 2 tablets (20 mg total) by mouth daily. 14 tablet 0  . Pyridoxine HCl (VITAMIN B-6) 250 MG tablet Take 250 mg by mouth daily.    Marland Kitchen zolpidem (AMBIEN) 10 MG tablet TAKE (1) TABLET BY MOUTH AT BEDTIME AS NEEDED. 30 tablet 5   No current facility-administered medications on file prior to visit.     Social History   Social History  . Marital status: Divorced    Spouse name: N/A  . Number of children: N/A  . Years of education: N/A   Occupational History  . Not on file.   Social History Main Topics  . Smoking status: Never Smoker  . Smokeless tobacco: Never Used  . Alcohol use Yes     Comment: occ  . Drug use: No  . Sexual activity: Yes    Birth control/ protection: None, Post-menopausal   Other Topics Concern  . Not on file   Social History Narrative  . No narrative on file    Family History  Problem Relation Age of Onset  . Hypertension Father   . Diabetes Father   . Hypertension Mother   . Colon polyps Mother   . Hypertension Sister   . Hyperlipidemia Sister   . Colon polyps Brother     BP (!) 145/87   Pulse 96   Temp 97.9 F (36.6 C)   Ht 5\' 6"  (1.676 m)   Wt 185 lb (83.9 kg)   LMP 09/03/2013   BMI 29.86 kg/m      Objective:   Physical Exam  Constitutional: She is oriented to person, place, and time. She appears well-developed and well-nourished.  HENT:  Head: Normocephalic and atraumatic.  Eyes: Pupils are equal, round, and reactive to light. Conjunctivae and EOM are normal.  Neck: Normal range of motion. Neck supple.  Cardiovascular: Normal rate, regular rhythm and intact distal pulses.   Pulmonary/Chest: Effort normal.  Abdominal: Soft.  Musculoskeletal: She exhibits tenderness (Painful right knee, ROM 5 to 100, unable to fully extend, pain  medial joint, positive Medial [place], effusion 1+, stable otherwise.  Left knee negative.  Slight crepitus right knee.).  Neurological: She is alert and oriented to person, place, and time. She displays normal reflexes. No cranial nerve deficit. She exhibits normal muscle tone. Coordination normal.  Skin: Skin is warm and dry.  Psychiatric: She has a normal mood and affect. Her behavior is normal. Judgment and thought content normal.  Vitals reviewed.  Assessment & Plan:   Encounter Diagnoses  Name Primary?  . Chronic pain of right knee Yes  . Chronic pain syndrome    PROCEDURE NOTE:  The patient requests injections of the right knee , verbal consent was obtained.  The right knee was prepped appropriately after time out was performed.   Sterile technique was observed and injection of 1 cc of Depo-Medrol 40 mg with several cc's of plain xylocaine. Anesthesia was provided by ethyl chloride and a 20-gauge needle was used to inject the knee area. The injection was tolerated well.  A band aid dressing was applied.  The patient was advised to apply ice later today and tomorrow to the injection sight as needed.  I have ordered a MRI of the right knee.  Return after the MRI.  Call if any problem.  Precautions discussed.   Electronically Signed Sanjuana Kava, MD 7/26/20182:48 PM

## 2017-03-17 NOTE — Telephone Encounter (Signed)
Ov with carolyn

## 2017-03-17 NOTE — Telephone Encounter (Signed)
Scratch that, pt saw ldr keeling yesterday

## 2017-03-17 NOTE — Telephone Encounter (Signed)
Noted  

## 2017-03-20 ENCOUNTER — Other Ambulatory Visit: Payer: Self-pay | Admitting: Family Medicine

## 2017-03-22 ENCOUNTER — Other Ambulatory Visit: Payer: Self-pay | Admitting: Family Medicine

## 2017-03-22 ENCOUNTER — Telehealth: Payer: Self-pay | Admitting: Family Medicine

## 2017-03-22 MED ORDER — HYDROCHLOROTHIAZIDE 25 MG PO TABS: ORAL_TABLET | ORAL | 0 refills | Status: DC

## 2017-03-22 MED ORDER — NAPROXEN 500 MG PO TABS: ORAL_TABLET | ORAL | 0 refills | Status: DC

## 2017-03-22 MED ORDER — LOSARTAN POTASSIUM 50 MG PO TABS: 50.0000 mg | ORAL_TABLET | Freq: Every day | ORAL | 0 refills | Status: DC

## 2017-03-22 NOTE — Telephone Encounter (Signed)
Pt calling for refills on her medicines  losartan (COZAAR) 50 MG tablet - completely out-needs today  hydrochlorothiazide (HYDRODIURIL) 25 MG tablet  - completely out-needs today   naproxen (NAPROSYN) 500 MG tablet   Please send to [facility] call pt when done

## 2017-03-22 NOTE — Telephone Encounter (Signed)
Prescriptions sent electronically to pharmacy. Patient notified. °

## 2017-03-22 NOTE — Telephone Encounter (Signed)
May have this +5 refill needs to keep follow-up visits

## 2017-03-23 ENCOUNTER — Ambulatory Visit (HOSPITAL_COMMUNITY)
Admission: RE | Admit: 2017-03-23 | Discharge: 2017-03-23 | Disposition: A | Discharge status: Home / Self Care | Payer: BLUE CROSS/BLUE SHIELD | Source: Ambulatory Visit | Attending: Orthopaedic Surgery | Admitting: Orthopaedic Surgery

## 2017-03-23 ENCOUNTER — Ambulatory Visit (HOSPITAL_COMMUNITY): Payer: BLUE CROSS/BLUE SHIELD

## 2017-03-23 DIAGNOSIS — M25561 Pain in right knee: Secondary | ICD-10-CM | POA: Diagnosis present

## 2017-03-23 DIAGNOSIS — R600 Localized edema: Secondary | ICD-10-CM | POA: Insufficient documentation

## 2017-03-23 DIAGNOSIS — G8929 Other chronic pain: Secondary | ICD-10-CM | POA: Diagnosis not present

## 2017-03-29 ENCOUNTER — Ambulatory Visit (INDEPENDENT_AMBULATORY_CARE_PROVIDER_SITE_OTHER): Payer: BLUE CROSS/BLUE SHIELD | Admitting: Orthopaedic Surgery

## 2017-03-29 ENCOUNTER — Encounter: Payer: Self-pay | Admitting: Orthopaedic Surgery

## 2017-03-29 VITALS — BP 144/78 | HR 98 | Temp 99.0°F | Ht 66.0 in | Wt 179.0 lb

## 2017-03-29 DIAGNOSIS — M25561 Pain in right knee: Secondary | ICD-10-CM | POA: Diagnosis not present

## 2017-03-29 DIAGNOSIS — G8929 Other chronic pain: Secondary | ICD-10-CM | POA: Diagnosis not present

## 2017-03-29 NOTE — Progress Notes (Signed)
Patient FT:DDUKGUR Laura Scott, female DOB:01-May-1961, 57 y.o. KYH:[id number]  Chief Complaint  Patient presents with  . Results    MRI Right Knee    HPI  Laura Scott is a 56 y.o. female who has had pain in the right knee since May.  She had a MRI done which showed: IMPRESSION: Edema in the visualized popliteus muscle is likely due to strain but could be due to myositis. No tear is identified.  Negative for meniscal or ligament tear.  I have explained the findings to her.  She does not need any surgery.  She is to continue her Naprosyn.  [facility]    HPI  Body mass index is 28.89 kg/m.  ROS  Review of Systems  HENT: Negative for congestion.   Respiratory: Negative for cough and shortness of breath.   Cardiovascular: Negative for chest pain and leg swelling.  Endocrine: Positive for cold intolerance.  Musculoskeletal: Positive for arthralgias, gait problem, joint swelling and myalgias.  Allergic/Immunologic: Positive for environmental allergies.  Psychiatric/Behavioral: The patient is nervous/anxious.     Past Medical History:  Diagnosis Date  . Allergic rhinitis   . Anxiety   . Chronic pain   . Depression   . GERD (gastroesophageal reflux disease)   . Headache   . Hyperlipidemia   . Hypertension   . IBS (irritable bowel syndrome)   . PMS (premenstrual syndrome)   . Reflux     Past Surgical History:  Procedure Laterality Date  . COLONOSCOPY  08/29/2011   [name]: normal. high risk screening tcs in 5 years.   . COLONOSCOPY WITH PROPOFOL N/A 10/20/2016   Procedure: COLONOSCOPY WITH PROPOFOL;  Surgeon: Daneil Dolin, MD;  [facility];  Service: Endoscopy;  Laterality: N/A;  10:45am  . ESOPHAGOGASTRODUODENOSCOPY (EGD) WITH PROPOFOL N/A 10/20/2016   Procedure: ESOPHAGOGASTRODUODENOSCOPY (EGD) WITH PROPOFOL;  Surgeon: Daneil Dolin, MD;  [facility]: [facility];  Service: Endoscopy;  Laterality: N/A;  . LUMBAR [place]    .  MALONEY DILATION N/A 10/20/2016   Procedure: Venia Minks DILATION;  Surgeon: Daneil Dolin, MD;  [facility]: [facility];  Service: Endoscopy;  Laterality: N/A;  . POLYPECTOMY  10/20/2016   Procedure: POLYPECTOMY;  Surgeon: Daneil Dolin, MD;  [facility]: [facility];  Service: Endoscopy;;  cecal and sigmoid polypectomies    Family History  Problem Relation Age of Onset  . Hypertension Father   . Diabetes Father   . Hypertension Mother   . Colon polyps Mother   . Hypertension Sister   . Hyperlipidemia Sister   . Colon polyps Brother     Social History Social History  Substance Use Topics  . Smoking status: Never Smoker  . Smokeless tobacco: Never Used  . Alcohol use Yes     Comment: occ    Allergies  Allergen Reactions  . Codeine Itching  . Sulfa Antibiotics Hives, Diarrhea and Itching  . Other     SURGICAL TAPE Reaction: rash   . Penicillins Hives, Itching and Rash    Has patient had a PCN reaction causing immediate rash, facial/tongue/throat swelling, SOB or lightheadedness with hypotension: Yes Has patient had a PCN reaction causing severe rash involving mucus membranes or skin necrosis: No Has patient had a PCN reaction that required hospitalization: No Has patient had a PCN reaction occurring within the last 10 years: Yes If all of the above answers are "NO", then may proceed with Cephalosporin use.    Current  Outpatient Prescriptions  Medication Sig Dispense Refill  . ALPRAZolam (XANAX) 0.5 MG tablet TAKE (1) TABLET BY MOUTH EVERY SIX HOURS AS NEEDED. MAY FILL MONTHLY PER MD. 40 tablet 5  . Cyanocobalamin (VITAMIN B-12) 5000 MCG SUBL Place 1 tablet under the tongue daily.    . cyclobenzaprine (FLEXERIL) 10 MG tablet   1  . escitalopram (LEXAPRO) 10 MG tablet TAKE ONE TABLET BY MOUTH DAILY. 30 tablet 5  . fluticasone (FLONASE) 50 MCG/ACT nasal spray USE 2 SPRAYS IN EACH  NOSTRIL DAILY 48 g 0  . gabapentin (NEURONTIN) 300 MG capsule Take 1 capsule (300 mg total) by mouth  3 (three) times daily. 270 capsule 1  . hydrochlorothiazide (HYDRODIURIL) 25 MG tablet TAKE 1 TABLET BY MOUTH  DAILY AS NEEDED FOR  SWELLING 90 tablet 0  . lidocaine (LIDODERM) 5 % Place 1 patch onto the skin daily as needed. Remove & Discard patch within 12 hours or as directed by MD    . loratadine (CLARITIN) 10 MG tablet Take 10 mg by mouth daily.    Marland Kitchen losartan (COZAAR) 50 MG tablet Take 1 tablet (50 mg total) by mouth daily. 90 tablet 0  . lubiprostone (AMITIZA) 24 MCG capsule Take 1 capsule (24 mcg total) by mouth 2 (two) times daily with a meal. DO NOT TAKE WITH MOVANTIK 60 capsule 3  . naloxegol oxalate (MOVANTIK) 12.5 MG TABS tablet Take 12.5 mg by mouth daily as needed. Patient states she takes as needed but not daily due to cost     . naproxen (NAPROSYN) 500 MG tablet TAKE 1 TABLET BY MOUTH  TWICE A DAY WITH MEALS AS  NEEDED FOR PAIN 180 tablet 0  . omeprazole (PRILOSEC) 40 MG capsule Take 1 capsule (40 mg total) by mouth daily. 90 capsule 1  . oxyCODONE-acetaminophen (PERCOCET/ROXICET) 5-325 MG tablet Take 1 tablet by mouth every 8 (eight) hours as needed for severe pain.     . predniSONE (DELTASONE) 10 MG tablet Take 2 tablets (20 mg total) by mouth daily. 14 tablet 0  . Pyridoxine HCl (VITAMIN B-6) 250 MG tablet Take 250 mg by mouth daily.    Marland Kitchen zolpidem (AMBIEN) 10 MG tablet TAKE (1) TABLET BY MOUTH AT BEDTIME AS NEEDED. 30 tablet 5   No current facility-administered medications for this visit.      Physical Exam  Blood pressure (!) 144/78, pulse 98, temperature 99 F (37.2 C), height 5\' 6"  (1.676 m), weight 179 lb (81.2 kg), last menstrual period 09/03/2013.  Constitutional: overall normal hygiene, normal nutrition, well developed, normal grooming, normal body habitus. Assistive device:none  Musculoskeletal: gait and station Limp none, muscle tone and strength are normal, no tremors or atrophy is present.  .  Neurological: coordination overall normal.  Deep tendon  reflex/nerve stretch intact.  Sensation normal.  Cranial nerves II-XII intact.   Skin:   Normal overall no scars, lesions, ulcers or rashes. No psoriasis.  Psychiatric: Alert and oriented x 3.  Recent memory intact, remote memory unclear.  Normal mood and affect. Well groomed.  Good eye contact.  Cardiovascular: overall no swelling, no varicosities, no edema bilaterally, normal temperatures of the legs and arms, no clubbing, cyanosis and good capillary refill.  Lymphatic: palpation is normal.  Right knee has just slight diffuse pain but full motion and no limp.  She has no effusion today.  NV intact.   The patient has been educated about the nature of the problem(s) and counseled on treatment options.  The patient  appeared to understand what I have discussed and is in agreement with it.  Encounter Diagnosis  Name Primary?  . Chronic pain of right knee Yes    PLAN Call if any problems.  Precautions discussed.  Continue current medications.   Return to clinic 1 month   Electronically Signed Sanjuana Kava, MD 8/8/201811:12 AM

## 2017-04-14 DIAGNOSIS — Z029 Encounter for administrative examinations, unspecified: Secondary | ICD-10-CM

## 2017-04-26 ENCOUNTER — Ambulatory Visit (INDEPENDENT_AMBULATORY_CARE_PROVIDER_SITE_OTHER): Payer: BLUE CROSS/BLUE SHIELD | Admitting: Orthopaedic Surgery

## 2017-04-26 ENCOUNTER — Encounter: Payer: Self-pay | Admitting: Orthopaedic Surgery

## 2017-04-26 VITALS — BP 163/76 | HR 98 | Temp 97.9°F | Ht 66.0 in | Wt 179.0 lb

## 2017-04-26 DIAGNOSIS — G8929 Other chronic pain: Secondary | ICD-10-CM

## 2017-04-26 DIAGNOSIS — M25561 Pain in right knee: Secondary | ICD-10-CM

## 2017-04-26 NOTE — Progress Notes (Signed)
Patient Laura Scott, female DOB:1960/12/26, 56 y.o. JSH:[id number]  Chief Complaint  Patient presents with  . Follow-up    Right knee    HPI   Laura Scott is a 56 y.o. female who has had knee pain on the right.  She is much improved.  She has little pain and no swelling now.  She is walking well. HPI  Body mass index is 28.89 kg/m.  ROS  Review of Systems  HENT: Negative for congestion.   Respiratory: Negative for cough and shortness of breath.   Cardiovascular: Negative for chest pain and leg swelling.  Endocrine: Positive for cold intolerance.  Musculoskeletal: Positive for arthralgias, gait problem, joint swelling and myalgias.  Allergic/Immunologic: Positive for environmental allergies.  Psychiatric/Behavioral: The patient is nervous/anxious.     Past Medical History:  Diagnosis Date  . Allergic rhinitis   . Anxiety   . Chronic pain   . Depression   . GERD (gastroesophageal reflux disease)   . Headache   . Hyperlipidemia   . Hypertension   . IBS (irritable bowel syndrome)   . PMS (premenstrual syndrome)   . Reflux     Past Surgical History:  Procedure Laterality Date  . COLONOSCOPY  08/29/2011   [name]: normal. high risk screening tcs in 5 years.   . COLONOSCOPY WITH PROPOFOL N/A 10/20/2016   Procedure: COLONOSCOPY WITH PROPOFOL;  Surgeon: Daneil Dolin, MD;  [facility]: [facility];  Service: Endoscopy;  Laterality: N/A;  10:45am  . ESOPHAGOGASTRODUODENOSCOPY (EGD) WITH PROPOFOL N/A 10/20/2016   Procedure: ESOPHAGOGASTRODUODENOSCOPY (EGD) WITH PROPOFOL;  Surgeon: Daneil Dolin, MD;  [facility];  Service: Endoscopy;  Laterality: N/A;  . LUMBAR [place]    . MALONEY DILATION N/A 10/20/2016   Procedure: Venia Minks DILATION;  Surgeon: Daneil Dolin, MD;  [facility];  Service: Endoscopy;  Laterality: N/A;  . POLYPECTOMY  10/20/2016   Procedure: POLYPECTOMY;  Surgeon: Daneil Dolin, MD;  [facility];  Service: Endoscopy;;   cecal and sigmoid polypectomies    Family History  Problem Relation Age of Onset  . Hypertension Father   . Diabetes Father   . Hypertension Mother   . Colon polyps Mother   . Hypertension Sister   . Hyperlipidemia Sister   . Colon polyps Brother     Social History Social History  Substance Use Topics  . Smoking status: Never Smoker  . Smokeless tobacco: Never Used  . Alcohol use Yes     Comment: occ    Allergies  Allergen Reactions  . Codeine Itching  . Sulfa Antibiotics Hives, Diarrhea and Itching  . Other     SURGICAL TAPE Reaction: rash   . Penicillins Hives, Itching and Rash    Has patient had a PCN reaction causing immediate rash, facial/tongue/throat swelling, SOB or lightheadedness with hypotension: Yes Has patient had a PCN reaction causing severe rash involving mucus membranes or skin necrosis: No Has patient had a PCN reaction that required hospitalization: No Has patient had a PCN reaction occurring within the last 10 years: Yes If all of the above answers are "NO", then may proceed with Cephalosporin use.    Current Outpatient Prescriptions  Medication Sig Dispense Refill  . ALPRAZolam (XANAX) 0.5 MG tablet TAKE (1) TABLET BY MOUTH EVERY SIX HOURS AS NEEDED. MAY FILL MONTHLY PER MD. 40 tablet 5  . Cyanocobalamin (VITAMIN B-12) 5000 MCG SUBL Place 1 tablet under the tongue daily.    . cyclobenzaprine (FLEXERIL)  10 MG tablet   1  . escitalopram (LEXAPRO) 10 MG tablet TAKE ONE TABLET BY MOUTH DAILY. 30 tablet 5  . fluticasone (FLONASE) 50 MCG/ACT nasal spray USE 2 SPRAYS IN EACH  NOSTRIL DAILY 48 g 0  . gabapentin (NEURONTIN) 300 MG capsule Take 1 capsule (300 mg total) by mouth 3 (three) times daily. 270 capsule 1  . hydrochlorothiazide (HYDRODIURIL) 25 MG tablet TAKE 1 TABLET BY MOUTH  DAILY AS NEEDED FOR  SWELLING 90 tablet 0  . lidocaine (LIDODERM) 5 % Place 1 patch onto the skin daily as needed. Remove & Discard patch within 12 hours or as directed by MD     . loratadine (CLARITIN) 10 MG tablet Take 10 mg by mouth daily.    Marland Kitchen losartan (COZAAR) 50 MG tablet Take 1 tablet (50 mg total) by mouth daily. 90 tablet 0  . lubiprostone (AMITIZA) 24 MCG capsule Take 1 capsule (24 mcg total) by mouth 2 (two) times daily with a meal. DO NOT TAKE WITH MOVANTIK 60 capsule 3  . naloxegol oxalate (MOVANTIK) 12.5 MG TABS tablet Take 12.5 mg by mouth daily as needed. Patient states she takes as needed but not daily due to cost     . naproxen (NAPROSYN) 500 MG tablet TAKE 1 TABLET BY MOUTH  TWICE A DAY WITH MEALS AS  NEEDED FOR PAIN 180 tablet 0  . omeprazole (PRILOSEC) 40 MG capsule Take 1 capsule (40 mg total) by mouth daily. 90 capsule 1  . oxyCODONE-acetaminophen (PERCOCET/ROXICET) 5-325 MG tablet Take 1 tablet by mouth every 8 (eight) hours as needed for severe pain.     . predniSONE (DELTASONE) 10 MG tablet Take 2 tablets (20 mg total) by mouth daily. 14 tablet 0  . Pyridoxine HCl (VITAMIN B-6) 250 MG tablet Take 250 mg by mouth daily.    Marland Kitchen zolpidem (AMBIEN) 10 MG tablet TAKE (1) TABLET BY MOUTH AT BEDTIME AS NEEDED. 30 tablet 5   No current facility-administered medications for this visit.      Physical Exam  Blood pressure (!) 163/76, pulse 98, temperature 97.9 F (36.6 C), height 5\' 6"  (1.676 m), weight 179 lb (81.2 kg), last menstrual period 09/03/2013.  Constitutional: overall normal hygiene, normal nutrition, well developed, normal grooming, normal body habitus. Assistive device:none  Musculoskeletal: gait and station Limp none, muscle tone and strength are normal, no tremors or atrophy is present.  .  Neurological: coordination overall normal.  Deep tendon reflex/nerve stretch intact.  Sensation normal.  Cranial nerves II-XII intact.   Skin:   Normal overall no scars, lesions, ulcers or rashes. No psoriasis.  Psychiatric: Alert and oriented x 3.  Recent memory intact, remote memory unclear.  Normal mood and affect. Well groomed.  Good eye  contact.  Cardiovascular: overall no swelling, no varicosities, no edema bilaterally, normal temperatures of the legs and arms, no clubbing, cyanosis and good capillary refill.  Lymphatic: palpation is normal.  She has full painless ROM of the right knee and normal gait.  Negative exam.  The patient has been educated about the nature of the problem(s) and counseled on treatment options.  The patient appeared to understand what I have discussed and is in agreement with it.  Encounter Diagnosis  Name Primary?  . Chronic pain of right knee Yes    PLAN Call if any problems.  Precautions discussed.  Continue current medications.   Return to clinic PRN   Electronically Signed Sanjuana Kava, MD 9/5/20183:24 PM

## 2017-05-10 ENCOUNTER — Other Ambulatory Visit: Payer: Self-pay | Admitting: Family Medicine

## 2017-05-10 ENCOUNTER — Other Ambulatory Visit: Payer: Self-pay

## 2017-05-10 ENCOUNTER — Telehealth: Payer: Self-pay | Admitting: Family Medicine

## 2017-05-10 ENCOUNTER — Other Ambulatory Visit: Payer: Self-pay | Admitting: Nurse Practitioner

## 2017-05-10 ENCOUNTER — Ambulatory Visit: Payer: BLUE CROSS/BLUE SHIELD | Admitting: Family Medicine

## 2017-05-10 MED ORDER — ZOLPIDEM TARTRATE 10 MG PO TABS: ORAL_TABLET | ORAL | 5 refills | Status: DC

## 2017-05-10 MED ORDER — GABAPENTIN 300 MG PO CAPS: 300.0000 mg | ORAL_CAPSULE | Freq: Three times a day (TID) | ORAL | 0 refills | Status: DC

## 2017-05-10 MED ORDER — ZOLPIDEM TARTRATE 10 MG PO TABS: ORAL_TABLET | ORAL | 0 refills | Status: DC

## 2017-05-10 NOTE — Progress Notes (Signed)
Ok six mo worth 

## 2017-05-10 NOTE — Telephone Encounter (Signed)
For Laura Scott-Patient has appointment on 9/26 for med check but is out of gabapentin 300 mg and ambien 10 mg called into [place]

## 2017-05-10 NOTE — Telephone Encounter (Signed)
One month supply given

## 2017-05-10 NOTE — Telephone Encounter (Signed)
Six mo ok 

## 2017-05-10 NOTE — Progress Notes (Signed)
Refills to be sent into pharmacy.

## 2017-05-11 NOTE — Telephone Encounter (Signed)
Spoke with patient and informed her per Linzie Collin- 1 months supply on requested medications were sent into pharmacy. Patient verbalized understanding.

## 2017-05-17 ENCOUNTER — Ambulatory Visit (INDEPENDENT_AMBULATORY_CARE_PROVIDER_SITE_OTHER): Payer: BLUE CROSS/BLUE SHIELD | Admitting: Nurse Practitioner

## 2017-05-17 ENCOUNTER — Encounter: Payer: Self-pay | Admitting: Nurse Practitioner

## 2017-05-17 VITALS — BP 168/74 | Ht 66.0 in | Wt 186.0 lb

## 2017-05-17 DIAGNOSIS — F329 Major depressive disorder, single episode, unspecified: Secondary | ICD-10-CM

## 2017-05-17 DIAGNOSIS — F32A Depression, unspecified: Secondary | ICD-10-CM

## 2017-05-17 DIAGNOSIS — F419 Anxiety disorder, unspecified: Secondary | ICD-10-CM

## 2017-05-17 DIAGNOSIS — G4700 Insomnia, unspecified: Secondary | ICD-10-CM

## 2017-05-17 DIAGNOSIS — J0100 Acute maxillary sinusitis, unspecified: Secondary | ICD-10-CM

## 2017-05-17 DIAGNOSIS — I10 Essential (primary) hypertension: Secondary | ICD-10-CM

## 2017-05-17 MED ORDER — ALPRAZOLAM 0.5 MG PO TABS: ORAL_TABLET | ORAL | 5 refills | Status: DC

## 2017-05-17 MED ORDER — LEVOFLOXACIN 500 MG PO TABS: 500.0000 mg | ORAL_TABLET | Freq: Every day | ORAL | 0 refills | Status: DC

## 2017-05-17 MED ORDER — ZOLPIDEM TARTRATE 10 MG PO TABS: ORAL_TABLET | ORAL | 5 refills | Status: DC

## 2017-05-17 NOTE — Progress Notes (Signed)
Subjective:  Presents for recheck on HTN. BP usually good outside of office. Relates increase today to eating country ham last night. Ambien working well for sleep. Compliant with BP meds. No CP/ischemic type pain or SOB. Has been experiencing more depression lately since going out on disability. Has days where she does not leave the house. Limited activity lately. Denies suicidal or homicidal thoughts or ideation. Takes Xanax as needed. Also c/o sinus symptoms x 3-4 days. Maxillary area headache. Clear mucus. No fever. Sore throat. No cough.   Objective:   BP (!) 168/74   Ht 5\' 6"  (1.676 m)   Wt 186 lb (84.4 kg)   LMP 09/03/2013   BMI 30.02 kg/m  NAD. Alert, oriented. TMs clear effusion. Nasal mucosa: dry and mildly erythematous. Pharynx injected with green PND noted. Neck supple with mild anterior adenopathy. Lungs clear. Heart RRR. No murmur or gallop.  LE: trace pitting edema. Carotids no bruits or thrills. Thoughts logical, coherent and relevant. Dressed appropriately. Making good eye contact.    Assessment:   Problem List Items Addressed This Visit      Cardiovascular and Mediastinum   Essential hypertension - Primary     Other   Anxiety and depression   Insomnia    Other Visit Diagnoses    Acute non-recurrent maxillary sinusitis       Relevant Medications   levofloxacin (LEVAQUIN) 500 MG tablet      Plan:   Meds ordered this encounter  Medications  . zolpidem (AMBIEN) 10 MG tablet    Sig: TAKE (1) TABLET BY MOUTH AT BEDTIME AS NEEDED.    Dispense:  30 tablet    Refill:  [place]    Order Specific Question:   Supervising Provider    Answer:   Mikey Kirschner [[id number]]  . ALPRAZolam (XANAX) 0.5 MG tablet    Sig: TAKE (1) TABLET BY MOUTH EVERY SIX HOURS AS NEEDED. MAY FILL MONTHLY PER MD.    Dispense:  40 tablet    Refill:  5    Order Specific Question:   Supervising Provider    Answer:   Mikey Kirschner [[id number]]  . levofloxacin (LEVAQUIN) 500 MG tablet     Sig: Take 1 tablet (500 mg total) by mouth daily.    Dispense:  10 tablet    Refill:  0    Order Specific Question:   Supervising Provider    Answer:   Mikey Kirschner [[id number]]   Monitor BP once a week; goal is below 140/90; send results to office Increase lexapro to 2 per day (20 mg). Discussed importance of regular activity and socialization.  Saline nasal spray then vaseline inside both nostrils Return in about 4 months (around 09/16/2017). Call back sooner if needed.  25 minutes was spent with the patient. Greater than half the time was spent in discussion and answering questions and counseling regarding the issues that the patient came in for today.

## 2017-05-17 NOTE — Patient Instructions (Addendum)
Monitor BP once a week; goal is below 140/90 Increase lexapro to 2 per day (20 mg) Saline nasal spray then vaseline

## 2017-05-18 ENCOUNTER — Telehealth: Payer: Self-pay | Admitting: Family Medicine

## 2017-05-18 NOTE — Telephone Encounter (Signed)
Patient was seen yesterday by Hoyle Sauer and was prescribe antibiotic for congestion but now has bad cough and needing something called into [place].

## 2017-05-18 NOTE — Telephone Encounter (Signed)
I spoke with the patient and she states she coughed all night last night. She says it was not productive,but would like to know what she could take otc or prescribed. I told her being so late that she r/c, this may not be addressed until in the morning. She is ok with this. Please advise.Thanks,CS

## 2017-05-18 NOTE — Telephone Encounter (Signed)
Left message to get more info 

## 2017-05-19 NOTE — Telephone Encounter (Signed)
Left message to return call 

## 2017-05-19 NOTE — Telephone Encounter (Signed)
No hydrocodone since she is allergic to Codeine Recommend Mucinex DM which will help loosen secretions, help cough and not increase BP

## 2017-05-23 NOTE — Telephone Encounter (Signed)
Discussed with pt. Pt verbalized understanding.  °

## 2017-06-15 ENCOUNTER — Other Ambulatory Visit: Payer: Self-pay | Admitting: Family Medicine

## 2017-06-20 ENCOUNTER — Ambulatory Visit (INDEPENDENT_AMBULATORY_CARE_PROVIDER_SITE_OTHER): Payer: BLUE CROSS/BLUE SHIELD

## 2017-06-20 ENCOUNTER — Ambulatory Visit (INDEPENDENT_AMBULATORY_CARE_PROVIDER_SITE_OTHER): Payer: BLUE CROSS/BLUE SHIELD | Admitting: Orthopaedic Surgery

## 2017-06-20 ENCOUNTER — Encounter: Payer: Self-pay | Admitting: Orthopaedic Surgery

## 2017-06-20 VITALS — BP 168/79 | HR 102 | Temp 98.8°F | Ht 66.0 in | Wt 183.0 lb

## 2017-06-20 DIAGNOSIS — M25562 Pain in left knee: Secondary | ICD-10-CM | POA: Diagnosis not present

## 2017-06-20 DIAGNOSIS — G894 Chronic pain syndrome: Secondary | ICD-10-CM | POA: Diagnosis not present

## 2017-06-20 DIAGNOSIS — G8929 Other chronic pain: Secondary | ICD-10-CM | POA: Diagnosis not present

## 2017-06-20 DIAGNOSIS — M25561 Pain in right knee: Secondary | ICD-10-CM | POA: Diagnosis not present

## 2017-06-20 NOTE — Progress Notes (Signed)
Patient ZO:XWRUEAV Scott, female DOB:03-25-1961, 56 y.o. WUJ:[id number]  Chief Complaint  Patient presents with  . Knee Pain    left    HPI  Laura Scott is a 56 y.o. female who has had pain of the right knee. She did well from the injection last time.  She has developed pain of the left knee now.  She has swelling and medial pain and popping.  She has no trauma, no giving way, no redness.  She is taking her NSAID. HPI  Body mass index is 29.54 kg/m.  ROS  Review of Systems  HENT: Negative for congestion.   Respiratory: Negative for cough and shortness of breath.   Cardiovascular: Negative for chest pain and leg swelling.  Endocrine: Positive for cold intolerance.  Musculoskeletal: Positive for arthralgias, gait problem, joint swelling and myalgias.  Allergic/Immunologic: Positive for environmental allergies.  Psychiatric/Behavioral: The patient is nervous/anxious.     Past Medical History:  Diagnosis Date  . Allergic rhinitis   . Anxiety   . Chronic pain   . Depression   . GERD (gastroesophageal reflux disease)   . Headache   . Hyperlipidemia   . Hypertension   . IBS (irritable bowel syndrome)   . PMS (premenstrual syndrome)   . Reflux     Past Surgical History:  Procedure Laterality Date  . COLONOSCOPY  08/29/2011   [name]: normal. high risk screening tcs in 5 years.   . COLONOSCOPY WITH PROPOFOL N/A 10/20/2016   Procedure: COLONOSCOPY WITH PROPOFOL;  Surgeon: Daneil Dolin, MD;  [facility];  Service: Endoscopy;  Laterality: N/A;  10:45am  . ESOPHAGOGASTRODUODENOSCOPY (EGD) WITH PROPOFOL N/A 10/20/2016   Procedure: ESOPHAGOGASTRODUODENOSCOPY (EGD) WITH PROPOFOL;  Surgeon: Daneil Dolin, MD;  [facility]: [facility];  Service: Endoscopy;  Laterality: N/A;  . LUMBAR [place]    . MALONEY DILATION N/A 10/20/2016   Procedure: Venia Minks DILATION;  Surgeon: Daneil Dolin, MD;  [facility]: [facility];  Service: Endoscopy;  Laterality: N/A;  . POLYPECTOMY   10/20/2016   Procedure: POLYPECTOMY;  Surgeon: Daneil Dolin, MD;  [facility]: [facility];  Service: Endoscopy;;  cecal and sigmoid polypectomies    Family History  Problem Relation Age of Onset  . Hypertension Father   . Diabetes Father   . Hypertension Mother   . Colon polyps Mother   . Hypertension Sister   . Hyperlipidemia Sister   . Colon polyps Brother     Social History Social History  Substance Use Topics  . Smoking status: Never Smoker  . Smokeless tobacco: Never Used  . Alcohol use Yes     Comment: occ    Allergies  Allergen Reactions  . Codeine Itching  . Sulfa Antibiotics Hives, Diarrhea and Itching  . Other     SURGICAL TAPE Reaction: rash   . Penicillins Hives, Itching and Rash    Has patient had a PCN reaction causing immediate rash, facial/tongue/throat swelling, SOB or lightheadedness with hypotension: Yes Has patient had a PCN reaction causing severe rash involving mucus membranes or skin necrosis: No Has patient had a PCN reaction that required hospitalization: No Has patient had a PCN reaction occurring within the last 10 years: Yes If all of the above answers are "NO", then may proceed with Cephalosporin use.    Current Outpatient Prescriptions  Medication Sig Dispense Refill  . ALPRAZolam (XANAX) 0.5 MG tablet TAKE (1) TABLET BY MOUTH EVERY SIX HOURS AS NEEDED. MAY FILL MONTHLY PER MD. 40  tablet 5  . Cyanocobalamin (VITAMIN B-12) 5000 MCG SUBL Place 1 tablet under the tongue daily.    . cyclobenzaprine (FLEXERIL) 10 MG tablet   1  . escitalopram (LEXAPRO) 10 MG tablet TAKE ONE TABLET BY MOUTH DAILY. 30 tablet 5  . fluticasone (FLONASE) 50 MCG/ACT nasal spray USE 2 SPRAYS IN EACH  NOSTRIL DAILY 48 g 0  . gabapentin (NEURONTIN) 300 MG capsule Take 1 capsule (300 mg total) by mouth 3 (three) times daily. 90 capsule 0  . hydrochlorothiazide (HYDRODIURIL) 25 MG tablet TAKE (1) TABLET BY MOUTH ONCE DAILY AS NEEDED. 90 tablet 0  . lidocaine (LIDODERM) 5  % Place 1 patch onto the skin daily as needed. Remove & Discard patch within 12 hours or as directed by MD    . loratadine (CLARITIN) 10 MG tablet Take 10 mg by mouth daily.    Marland Kitchen losartan (COZAAR) 50 MG tablet TAKE 1 TABLET BY MOUTH DAILY. 90 tablet 0  . lubiprostone (AMITIZA) 24 MCG capsule Take 1 capsule (24 mcg total) by mouth 2 (two) times daily with a meal. DO NOT TAKE WITH MOVANTIK 60 capsule 3  . naloxegol oxalate (MOVANTIK) 12.5 MG TABS tablet Take 12.5 mg by mouth daily as needed. Patient states she takes as needed but not daily due to cost     . naproxen (NAPROSYN) 500 MG tablet TAKE 1 TABLET BY MOUTH  TWICE A DAY WITH MEALS AS  NEEDED FOR PAIN 180 tablet 0  . omeprazole (PRILOSEC) 40 MG capsule Take 1 capsule (40 mg total) by mouth daily. 90 capsule 1  . oxyCODONE-acetaminophen (PERCOCET/ROXICET) 5-325 MG tablet Take 1 tablet by mouth every 8 (eight) hours as needed for severe pain.     Marland Kitchen Pyridoxine HCl (VITAMIN B-6) 250 MG tablet Take 250 mg by mouth daily.    Marland Kitchen zolpidem (AMBIEN) 10 MG tablet TAKE (1) TABLET BY MOUTH AT BEDTIME AS NEEDED. 30 tablet 5  . levofloxacin (LEVAQUIN) 500 MG tablet Take 1 tablet (500 mg total) by mouth daily. (Patient not taking: Reported on 06/20/2017) 10 tablet 0   No current facility-administered medications for this visit.      Physical Exam  Blood pressure (!) 168/79, pulse (!) 102, temperature 98.8 F (37.1 C), height 5\' 6"  (1.676 m), weight 183 lb (83 kg), last menstrual period 09/03/2013.  Constitutional: overall normal hygiene, normal nutrition, well developed, normal grooming, normal body habitus. Assistive device:none  Musculoskeletal: gait and station Limp left, muscle tone and strength are normal, no tremors or atrophy is present.  .  Neurological: coordination overall normal.  Deep tendon reflex/nerve stretch intact.  Sensation normal.  Cranial nerves II-XII intact.   Skin:   Normal overall no scars, lesions, ulcers or rashes. No  psoriasis.  Psychiatric: Alert and oriented x 3.  Recent memory intact, remote memory unclear.  Normal mood and affect. Well groomed.  Good eye contact.  Cardiovascular: overall no swelling, no varicosities, no edema bilaterally, normal temperatures of the legs and arms, no clubbing, cyanosis and good capillary refill.  Lymphatic: palpation is normal.  All other systems reviewed and are negative   The left lower extremity is examined:  Inspection:  Thigh:  Non-tender and no defects  Knee has swelling 1+ effusion.                        Joint tenderness is present  Patient is tender over the medial joint line  Lower Leg:  Has normal appearance and no tenderness or defects  Ankle:  Non-tender and no defects  Foot:  Non-tender and no defects Range of Motion:  Knee:  Range of motion is: 0-110                        Crepitus is  present  Ankle:  Range of motion is normal. Strength and Tone:  The left lower extremity has normal strength and tone. Stability:  Knee:  The knee is stable.  Ankle:  The ankle is stable.   The patient has been educated about the nature of the problem(s) and counseled on treatment options.  The patient appeared to understand what I have discussed and is in agreement with it.  Encounter Diagnoses  Name Primary?  . Chronic pain of left knee Yes  . Chronic pain of right knee   . Chronic pain syndrome    X-rays were done of the left knee, reported separately.  PROCEDURE NOTE:  The patient requests injections of the left knee , verbal consent was obtained.  The left knee was prepped appropriately after time out was performed.   Sterile technique was observed and injection of 1 cc of Depo-Medrol 40 mg with several cc's of plain xylocaine. Anesthesia was provided by ethyl chloride and a 20-gauge needle was used to inject the knee area. The injection was tolerated well.  A band aid dressing was applied.  The patient was advised to  apply ice later today and tomorrow to the injection sight as needed.  PROCEDURE NOTE:  The patient requests injections of the right knee , verbal consent was obtained.  The right knee was prepped appropriately after time out was performed.   Sterile technique was observed and injection of 1 cc of Depo-Medrol 40 mg with several cc's of plain xylocaine. Anesthesia was provided by ethyl chloride and a 20-gauge needle was used to inject the knee area. The injection was tolerated well.  A band aid dressing was applied.  The patient was advised to apply ice later today and tomorrow to the injection sight as needed.  PLAN Call if any problems.  Precautions discussed.  Continue current medications.   Return to clinic 6 weeks   Electronically Signed Sanjuana Kava, MD 10/30/201810:03 AM

## 2017-07-06 ENCOUNTER — Encounter: Payer: Self-pay | Admitting: Family Medicine

## 2017-07-06 ENCOUNTER — Ambulatory Visit: Payer: BLUE CROSS/BLUE SHIELD | Admitting: Family Medicine

## 2017-07-06 VITALS — Temp 98.4°F | Ht 66.0 in | Wt 189.4 lb

## 2017-07-06 DIAGNOSIS — J329 Chronic sinusitis, unspecified: Secondary | ICD-10-CM | POA: Diagnosis not present

## 2017-07-06 DIAGNOSIS — J310 Chronic rhinitis: Secondary | ICD-10-CM

## 2017-07-06 MED ORDER — LEVOFLOXACIN 500 MG PO TABS: 500.0000 mg | ORAL_TABLET | Freq: Every day | ORAL | 0 refills | Status: DC

## 2017-07-06 NOTE — Progress Notes (Signed)
   Subjective:    Patient ID: Laura Scott, female    DOB: 10-23-1960, 56 y.o.   MRN: [id number]  Sinus Problem  This is a new problem. The current episode started in the past 7 days. Associated symptoms include congestion, ear pain, headaches and a sore throat. Treatments tried: otc cold meds.    Pos exposure ot nephew with substantial sickness  Dim energy and appetite  No feve    Uses claritin reg and flonase   Frontal and cheek pain and ears  Some green discharge  Cough none at all    night time throat most sore   Some aching in the bak     Review of Systems  HENT: Positive for congestion, ear pain and sore throat.   Neurological: Positive for headaches.       Objective:   Physical Exam Alert, mild malaise. Hydration good Vitals stable. frontal/ maxillary tenderness evident positive nasal congestion. pharynx normal neck supple  lungs clear/no crackles or wheezes. heart regular in rhythm        Assessment & Plan:  Impression rhinosinusitis likely post viral, discussed with patient. plan antibiotics prescribed. Questions answered. Symptomatic care discussed. warning signs discussed. WSL

## 2017-07-07 ENCOUNTER — Telehealth: Payer: Self-pay | Admitting: Family Medicine

## 2017-07-07 MED ORDER — BENZONATATE 100 MG PO CAPS: 100.0000 mg | ORAL_CAPSULE | Freq: Three times a day (TID) | ORAL | 0 refills | Status: DC | PRN

## 2017-07-07 NOTE — Telephone Encounter (Signed)
Patient seen Dr. Richardson Landry on 07/06/17 for rhinosinusitis.  She said she is now having cough and body aches and would like something called in for this.   [employer]

## 2017-07-07 NOTE — Telephone Encounter (Signed)
Spoke with Dr.Steve Luking and received verbal orders to send in [employer] 1 tablet 3 times daily as needed for cough #30 and advise patient for body aches to take either Tylenol or Advil. Patient notified and verbalized understanding. Tessalon sent to pharmacy.

## 2017-07-17 ENCOUNTER — Other Ambulatory Visit: Payer: Self-pay | Admitting: Nurse Practitioner

## 2017-07-17 NOTE — Telephone Encounter (Signed)
Last seen 05/17/17

## 2017-08-02 ENCOUNTER — Ambulatory Visit: Payer: BLUE CROSS/BLUE SHIELD | Admitting: Orthopaedic Surgery

## 2017-08-08 ENCOUNTER — Encounter: Payer: Self-pay | Admitting: Orthopaedic Surgery

## 2017-08-08 ENCOUNTER — Ambulatory Visit (INDEPENDENT_AMBULATORY_CARE_PROVIDER_SITE_OTHER): Payer: BLUE CROSS/BLUE SHIELD | Admitting: Orthopaedic Surgery

## 2017-08-08 VITALS — BP 164/83 | HR 115 | Temp 98.9°F | Ht 66.0 in | Wt 192.0 lb

## 2017-08-08 DIAGNOSIS — G8929 Other chronic pain: Secondary | ICD-10-CM

## 2017-08-08 DIAGNOSIS — M25562 Pain in left knee: Secondary | ICD-10-CM

## 2017-08-08 DIAGNOSIS — G894 Chronic pain syndrome: Secondary | ICD-10-CM

## 2017-08-08 DIAGNOSIS — M25561 Pain in right knee: Secondary | ICD-10-CM | POA: Diagnosis not present

## 2017-08-08 NOTE — Progress Notes (Signed)
CC: Both of my knees are hurting. I would like an injection in both knees.  The patient has had chronic pain and tenderness of both knees for some time.  Injections help.  There is no locking or giving way of the knee.  There is no new trauma. There is no redness or signs of infections.  The knees have a mild effusion and some crepitus.  There is no redness or signs of recent trauma.  Right knee ROM is 0-110 and left knee ROM is 0-105.  Impression:  Chronic pain of the both knees  Return:  2 months  PROCEDURE NOTE:  The patient requests injections of both knees, verbal consent was obtained.  The left and right knee were individually prepped appropriately after time out was performed.   Sterile technique was observed and injection of 1 cc of Depo-Medrol 40 mg with several cc's of plain xylocaine. Anesthesia was provided by ethyl chloride and a 20-gauge needle was used to inject each knee area. The injections were tolerated well.  A band aid dressing was applied.  The patient was advised to apply ice later today and tomorrow to the injection sight as needed.   Electronically Signed Sanjuana Kava, MD 12/18/20182:33 PM

## 2017-08-17 ENCOUNTER — Other Ambulatory Visit: Payer: Self-pay | Admitting: Family Medicine

## 2017-08-17 DIAGNOSIS — Z1231 Encounter for screening mammogram for malignant neoplasm of breast: Secondary | ICD-10-CM

## 2017-09-08 ENCOUNTER — Ambulatory Visit
Admission: RE | Admit: 2017-09-08 | Discharge: 2017-09-08 | Disposition: A | Discharge status: Home / Self Care | Payer: BLUE CROSS/BLUE SHIELD | Source: Ambulatory Visit | Attending: Family Medicine | Admitting: Family Medicine

## 2017-09-08 DIAGNOSIS — Z1231 Encounter for screening mammogram for malignant neoplasm of breast: Secondary | ICD-10-CM

## 2017-09-18 ENCOUNTER — Other Ambulatory Visit: Payer: Self-pay | Admitting: Family Medicine

## 2017-09-29 ENCOUNTER — Other Ambulatory Visit: Payer: Self-pay | Admitting: Family Medicine

## 2017-10-10 ENCOUNTER — Ambulatory Visit: Payer: BLUE CROSS/BLUE SHIELD | Admitting: Orthopaedic Surgery

## 2017-10-10 ENCOUNTER — Encounter: Payer: Self-pay | Admitting: Orthopaedic Surgery

## 2017-10-10 DIAGNOSIS — G8929 Other chronic pain: Secondary | ICD-10-CM

## 2017-10-10 DIAGNOSIS — G894 Chronic pain syndrome: Secondary | ICD-10-CM

## 2017-10-10 DIAGNOSIS — M25561 Pain in right knee: Secondary | ICD-10-CM | POA: Diagnosis not present

## 2017-10-10 DIAGNOSIS — M25562 Pain in left knee: Secondary | ICD-10-CM | POA: Diagnosis not present

## 2017-10-10 MED ORDER — IBUPROFEN 800 MG PO TABS: ORAL_TABLET | ORAL | 5 refills | Status: DC

## 2017-10-10 NOTE — Progress Notes (Signed)
CC: Both of my knees are hurting. I would like an injection in both knees.  The patient has had chronic pain and tenderness of both knees for some time.  Injections help.  There is no locking or giving way of the knee.  There is no new trauma. There is no redness or signs of infections.  The knees have a mild effusion and some crepitus.  There is no redness or signs of recent trauma.  Right knee ROM is 0-110 and left knee ROM is 0-105.  Impression:  Chronic pain of the both knees  Return:  3 months  PROCEDURE NOTE:  The patient requests injections of both knees, verbal consent was obtained.  The left and right knee were individually prepped appropriately after time out was performed.   Sterile technique was observed and injection of 1 cc of Depo-Medrol 40 mg with several cc's of plain xylocaine. Anesthesia was provided by ethyl chloride and a 20-gauge needle was used to inject each knee area. The injections were tolerated well.  A band aid dressing was applied.  The patient was advised to apply ice later today and tomorrow to the injection sight as needed.   Rx for ibuprofen given.  Electronically Signed Sanjuana Kava, MD 2/19/20191:48 PM

## 2017-10-16 ENCOUNTER — Other Ambulatory Visit: Payer: Self-pay | Admitting: Gastroenterology

## 2017-11-13 ENCOUNTER — Other Ambulatory Visit: Payer: Self-pay | Admitting: Family Medicine

## 2017-11-13 NOTE — Telephone Encounter (Signed)
One mo both due f u visit with carolyn

## 2017-11-14 ENCOUNTER — Encounter: Payer: Self-pay | Admitting: Nurse Practitioner

## 2017-11-14 ENCOUNTER — Ambulatory Visit: Payer: BLUE CROSS/BLUE SHIELD | Admitting: Nurse Practitioner

## 2017-11-14 VITALS — BP 156/88 | Temp 98.5°F | Ht 66.0 in | Wt 189.2 lb

## 2017-11-14 DIAGNOSIS — J019 Acute sinusitis, unspecified: Secondary | ICD-10-CM

## 2017-11-14 DIAGNOSIS — B9689 Other specified bacterial agents as the cause of diseases classified elsewhere: Secondary | ICD-10-CM

## 2017-11-14 MED ORDER — LEVOFLOXACIN 500 MG PO TABS: 500.0000 mg | ORAL_TABLET | Freq: Every day | ORAL | 0 refills | Status: DC

## 2017-11-14 MED ORDER — METHYLPREDNISOLONE ACETATE 40 MG/ML IJ SUSP
40.0000 mg | Freq: Once | INTRAMUSCULAR | Status: AC
Start: 2017-11-14 — End: 2017-11-14
  Administered 2017-11-14: 40 mg via INTRAMUSCULAR

## 2017-11-14 NOTE — Progress Notes (Signed)
Subjective: Presents for complaints of sinus symptoms that began 2 days ago.  Facial area pressure.  Has felt hot at times, no documented fever.  Now producing green mucus.  Scratchy throat.  Occasional cough.  No wheezing.  No ear pain.  Has been taking Mucinex D which she thinks has raised her blood pressure.  Objective:   BP (!) 156/88 Comment: taking Mucinex D  Temp 98.5 F (36.9 C) (Oral)   Ht 5\' 6"  (1.676 m)   Wt 189 lb 3.2 oz (85.8 kg)   LMP 09/03/2013   BMI 30.54 kg/m  NAD.  Alert, oriented.  TMs significant effusion, no erythema.  Pharynx injected with green PND noted.  Neck supple with mild soft anterior adenopathy.  Obvious head congestion noted.  Lungs clear.  Heart regular rate rhythm.  Assessment:  Acute bacterial rhinosinusitis - Plan: methylPREDNISolone acetate (DEPO-MEDROL) injection 40 mg    Plan:   Meds ordered this encounter  Medications  . levofloxacin (LEVAQUIN) 500 MG tablet    Sig: Take 1 tablet (500 mg total) by mouth daily.    Dispense:  10 tablet    Refill:  0    Order Specific Question:   Supervising Provider    Answer:   Mikey Kirschner [[id number]]  . methylPREDNISolone acetate (DEPO-MEDROL) injection 40 mg   Hold on Mucinex D due to elevated blood pressure.  Continue Flonase and Claritin as directed.  Call back by the end of the week if no improvement, sooner if worse.

## 2017-11-30 ENCOUNTER — Other Ambulatory Visit: Payer: Self-pay | Admitting: Family Medicine

## 2017-11-30 NOTE — Telephone Encounter (Signed)
May ref ea monthly times six totoa (one with five refills)

## 2017-12-13 ENCOUNTER — Ambulatory Visit: Payer: BLUE CROSS/BLUE SHIELD | Admitting: Advanced Practice Midwife

## 2017-12-13 ENCOUNTER — Encounter: Payer: Self-pay | Admitting: Advanced Practice Midwife

## 2017-12-13 VITALS — BP 136/78 | HR 84 | Ht 65.5 in | Wt 178.0 lb

## 2017-12-13 DIAGNOSIS — N898 Other specified noninflammatory disorders of vagina: Secondary | ICD-10-CM

## 2017-12-13 MED ORDER — AZITHROMYCIN 250 MG PO TABS: 250.0000 mg | ORAL_TABLET | Freq: Every day | ORAL | 0 refills | Status: DC

## 2017-12-13 MED ORDER — LIDOCAINE 5 % EX OINT: 1.0000 "application " | TOPICAL_OINTMENT | CUTANEOUS | 0 refills | Status: DC | PRN

## 2017-12-13 NOTE — Progress Notes (Signed)
[facility] Visit  Patient name: Laura Scott MRN [id number]  Date of birth: 09-06-1960  CC & HPI:  Laura Scott is a 57 y.o. African American female presenting today for vaginal itching for 2-3 weeks.  Says discharge is normal appearing.  Itch is vulvar in a few spots. Less itchy now except in one spot near clitoris.  Last unprotected intercourse 1-2 months ago, poor historian. Earlier this month Started ABX from PCP (levaquin) for URI, only took 4 days because it made her sick. Still has URI sx and now feeling it in her chest.      Pertinent History Reviewed:  Medical & Surgical Hx:   Past Medical History:  Diagnosis Date  . Allergic rhinitis   . Anxiety   . Chronic pain   . Depression   . GERD (gastroesophageal reflux disease)   . Headache   . Hyperlipidemia   . Hypertension   . IBS (irritable bowel syndrome)   . PMS (premenstrual syndrome)   . Reflux    Past Surgical History:  Procedure Laterality Date  . COLONOSCOPY  08/29/2011   [name]: normal. high risk screening tcs in 5 years.   . COLONOSCOPY WITH PROPOFOL N/A 10/20/2016   Procedure: COLONOSCOPY WITH PROPOFOL;  Surgeon: Daneil Dolin, MD;  [facility];  Service: Endoscopy;  Laterality: N/A;  10:45am  . ESOPHAGOGASTRODUODENOSCOPY (EGD) WITH PROPOFOL N/A 10/20/2016   Procedure: ESOPHAGOGASTRODUODENOSCOPY (EGD) WITH PROPOFOL;  Surgeon: Daneil Dolin, MD;  [facility];  Service: Endoscopy;  Laterality: N/A;  . LUMBAR [place]    . MALONEY DILATION N/A 10/20/2016   Procedure: Venia Minks DILATION;  Surgeon: Daneil Dolin, MD;  [facility];  Service: Endoscopy;  Laterality: N/A;  . POLYPECTOMY  10/20/2016   Procedure: POLYPECTOMY;  Surgeon: Daneil Dolin, MD;  [facility];  Service: Endoscopy;;  cecal and sigmoid polypectomies   Family History  Problem Relation Age of Onset  . Hypertension Father   . Diabetes Father   . Hypertension Mother   . Colon polyps Mother    . Hypertension Sister   . Hyperlipidemia Sister   . Colon polyps Brother     Current Outpatient Medications:  .  ALPRAZolam (XANAX) 0.5 MG tablet, TAKE (1) TABLET BY MOUTH EVERY SIX HOURS AS NEEDED. MAY FILL MONTHLY PER MD., Disp: 40 tablet, Rfl: 5 .  AMITIZA 24 MCG capsule, TAKE ONE CAPSULE BY MOUTH 2 TIMES A DAY WITH A MEAL. DO NOT TAKE WITHMOVANTIK., Disp: 60 capsule, Rfl: 3 .  Cyanocobalamin (VITAMIN B-12) 5000 MCG SUBL, Place 1 tablet under the tongue daily., Disp: , Rfl:  .  cyclobenzaprine (FLEXERIL) 10 MG tablet, , Disp: , Rfl: 1 .  fluticasone (FLONASE) 50 MCG/ACT nasal spray, USE 2 SPRAYS IN EACH  NOSTRIL DAILY, Disp: 48 g, Rfl: 0 .  gabapentin (NEURONTIN) 300 MG capsule, TAKE 1 CAPSULE BY MOUTH THREE TIMES A DAY., Disp: 90 capsule, Rfl: 5 .  hydrochlorothiazide (HYDRODIURIL) 25 MG tablet, TAKE (1) TABLET BY MOUTH ONCE DAILY AS NEEDED., Disp: 30 tablet, Rfl: 0 .  ibuprofen (ADVIL,MOTRIN) 800 MG tablet, One tablet by mouth every 8 hours as needed for pain, Disp: 270 tablet, Rfl: 5 .  levofloxacin (LEVAQUIN) 500 MG tablet, Take 1 tablet (500 mg total) by mouth daily., Disp: 10 tablet, Rfl: 0 .  loratadine (CLARITIN) 10 MG tablet, Take 10 mg by mouth daily., Disp: , Rfl:  .  losartan (COZAAR) 50 MG tablet,  TAKE 1 TABLET BY MOUTH DAILY., Disp: 30 tablet, Rfl: 0 .  omeprazole (PRILOSEC) 40 MG capsule, Take 1 capsule (40 mg total) by mouth daily., Disp: 90 capsule, Rfl: 1 .  oxyCODONE-acetaminophen (PERCOCET/ROXICET) 5-325 MG tablet, Take 1 tablet by mouth every 8 (eight) hours as needed for severe pain. , Disp: , Rfl:  .  Pyridoxine HCl (VITAMIN B-6) 250 MG tablet, Take 250 mg by mouth daily., Disp: , Rfl:  .  zolpidem (AMBIEN) 10 MG tablet, TAKE (1) TABLET BY MOUTH AT BEDTIME AS NEEDED., Disp: 30 tablet, Rfl: 5 .  benzonatate (TESSALON) 100 MG capsule, Take 1 capsule (100 mg total) 3 (three) times daily as needed by mouth for cough. (Patient not taking: Reported on 11/14/2017), Disp: 30  capsule, Rfl: 0 .  lidocaine (LIDODERM) 5 %, Place 1 patch onto the skin daily as needed. Remove & Discard patch within 12 hours or as directed by MD, Disp: , Rfl:  .  naloxegol oxalate (MOVANTIK) 12.5 MG TABS tablet, Take 12.5 mg by mouth daily as needed. Patient states she takes as needed but not daily due to cost , Disp: , Rfl:  Social History: Reviewed -  reports that she has never smoked. She has never used smokeless tobacco.  Review of Systems:   Constitutional: Negative for fever and chills Eyes: Negative for visual disturbances Respiratory: Negative for shortness of breath, dyspnea Cardiovascular: Negative for chest pain or palpitations  Gastrointestinal: Negative for vomiting, diarrhea and constipation; no abdominal pain Genitourinary: Negative for dysuria and urgency, vaginal irritation or itching Musculoskeletal: Negative for back pain, joint pain, myalgias  Neurological: Negative for dizziness and headaches    Objective Findings:    Physical Examination: Vitals:   12/13/17 1536  BP: 136/78  Pulse: 84   General appearance - well appearing, and in no distress Mental status - alert, oriented to person, place, and time Chest:  Normal respiratory effort Heart - normal rate and regular rhythm Abdomen:  Soft, nontender Pelvic: One lesion near clitoris appears to be HSV.  Has several healed areas on labia minora, where pt says it used to itch.  Vagi nal mucosa noraml  Vaginal discharge appears normal .  Musculoskeletal:  Normal range of motion without pain Extremities:  No edema    No results found for this or any previous visit (from the past 24 hour(s)).    Assessment & Plan:  A:   Suspect HSV, culture taken from only remaining ulcer, but nearly dry  URI P: if gets new lesions, f/u asap for culture rx zpack   Orders Placed This Encounter  Procedures  . Herpes simplex virus culture  . NuSwab Vaginitis Plus (VG+)   Lidocaine ointment prn   No follow-ups on  file.  Christin Fudge CNM 12/13/2017 3:44 PM

## 2017-12-16 LAB — NUSWAB VAGINITIS PLUS (VG+)
Atopobium vaginae: HIGH Score — AB
BVAB 2: HIGH Score — AB
Candida albicans, NAA: NEGATIVE
Candida glabrata, NAA: NEGATIVE
Chlamydia trachomatis, NAA: NEGATIVE
Megasphaera 1: HIGH Score — AB
Neisseria gonorrhoeae, NAA: NEGATIVE
Trich vag by NAA: NEGATIVE

## 2017-12-16 LAB — HERPES SIMPLEX VIRUS CULTURE

## 2017-12-20 ENCOUNTER — Other Ambulatory Visit: Payer: Self-pay | Admitting: Advanced Practice Midwife

## 2017-12-20 MED ORDER — METRONIDAZOLE 500 MG PO TABS: 500.0000 mg | ORAL_TABLET | Freq: Two times a day (BID) | ORAL | 0 refills | Status: DC

## 2017-12-20 NOTE — Progress Notes (Signed)
Flagyl for BV

## 2017-12-28 IMAGING — US US EXTREM LOW VENOUS*R*
1 series · 13 of 24 positions shown · non-contrast
Comparison: None.

CLINICAL DATA: Right lower extremity pain and swelling for 2 days



[Series 1: us extrem low venous*right* · 0.08mm/px · 13 of 57 slices shown]
[im 1/57]
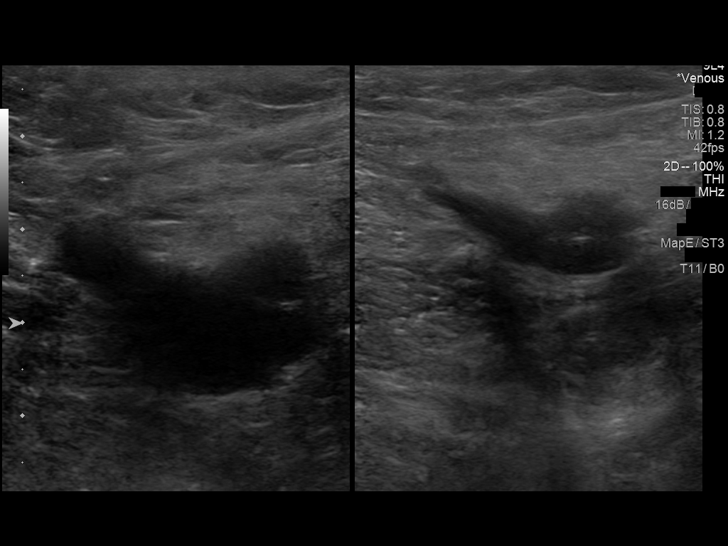
[im 5/57]
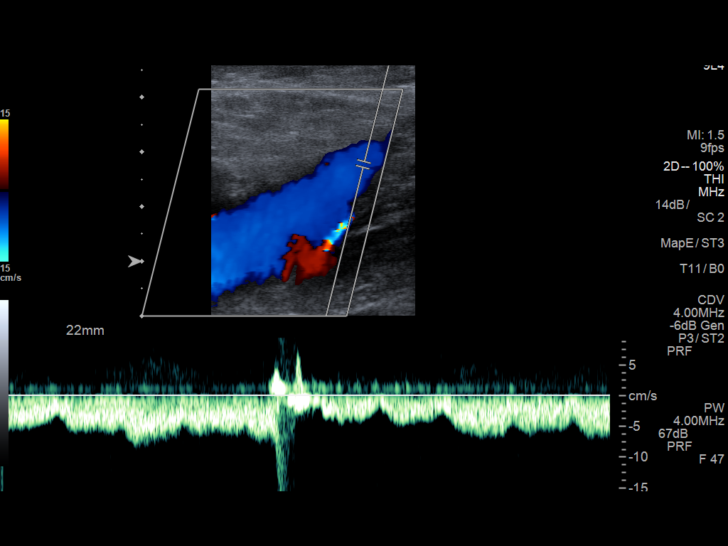
[im 10/57]
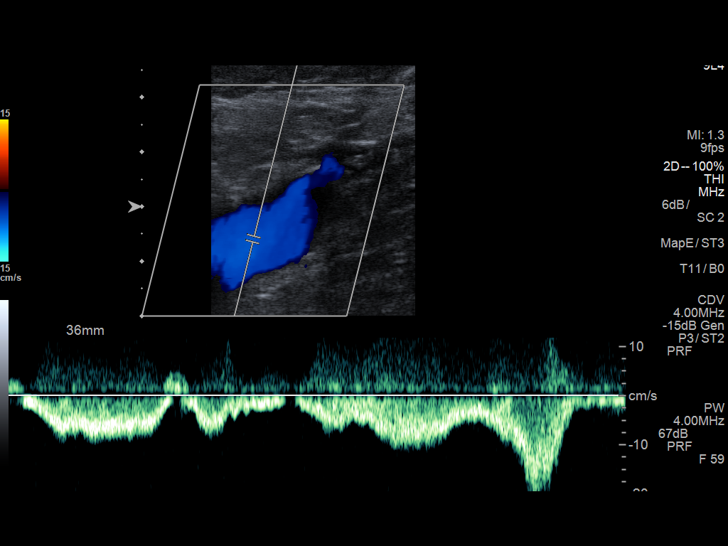
[im 15/57]
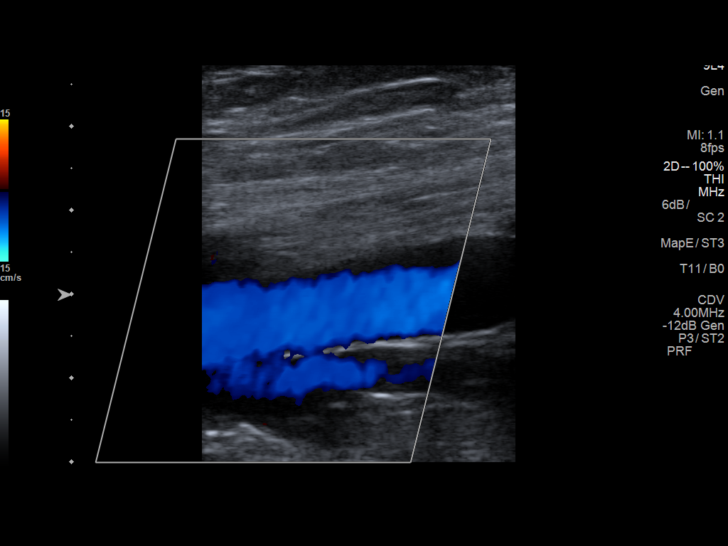
[im 20/57]
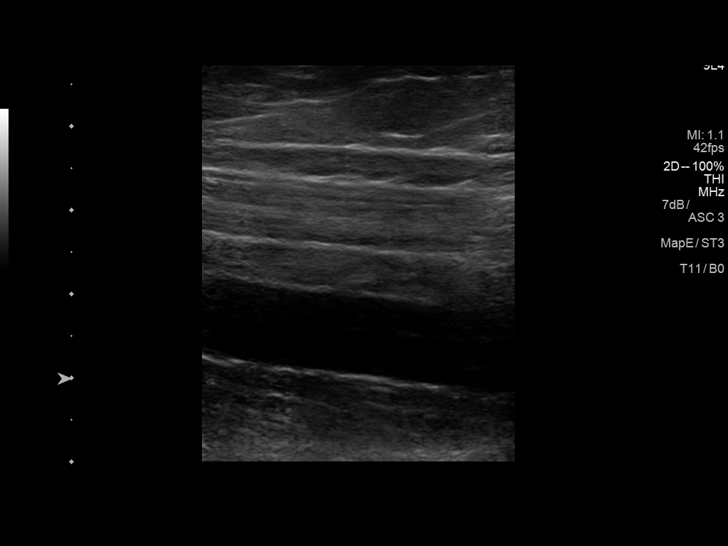
[im 25/57]
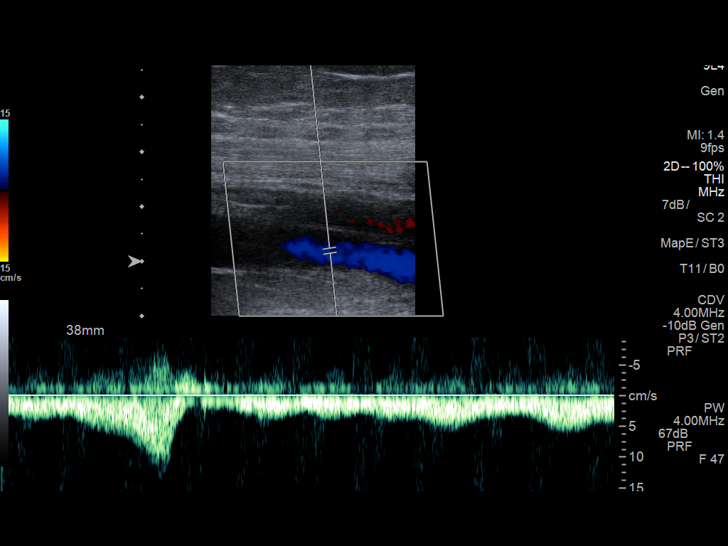
[im 30/57]
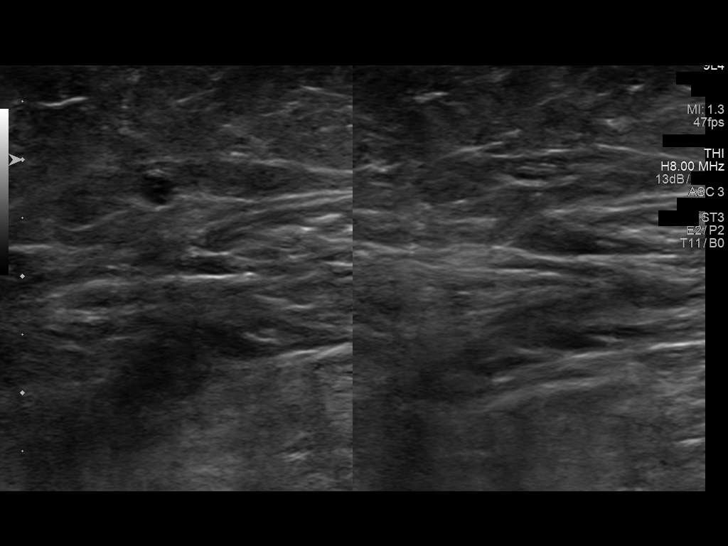
[im 32/57]
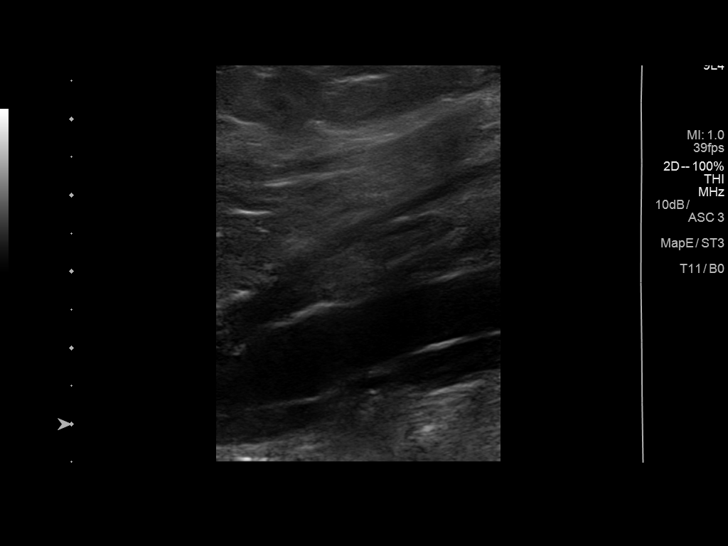
[im 37/57]
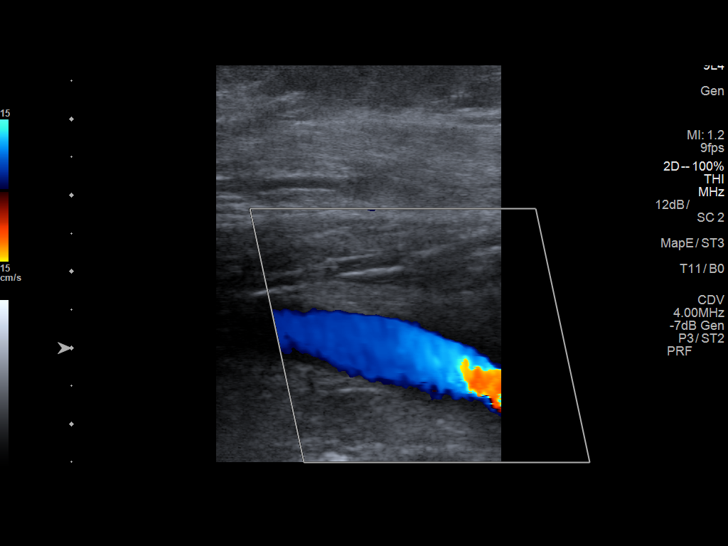
[im 42/57]
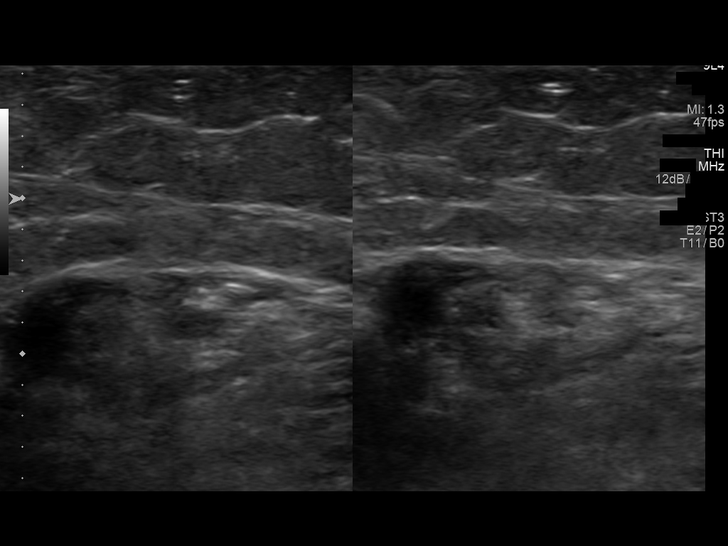
[im 47/57]
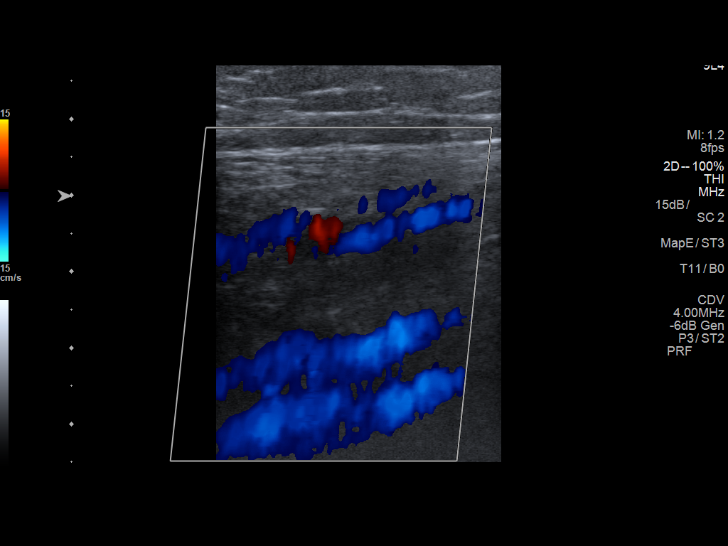
[im 52/57]
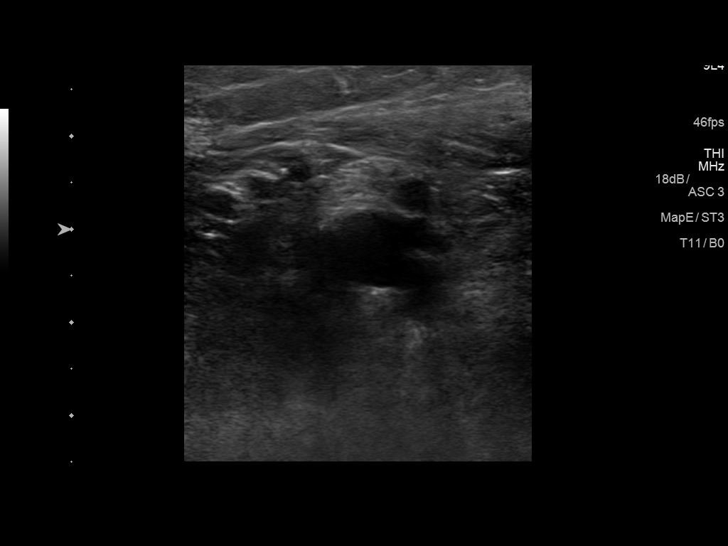
[im 57/57]
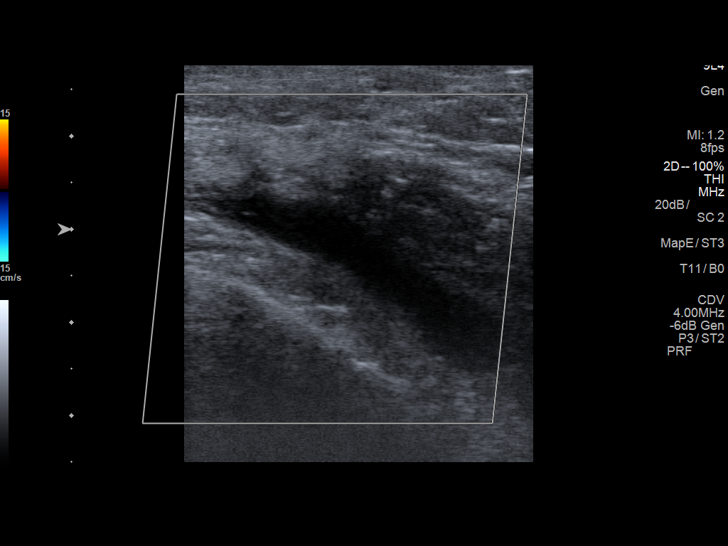

[13 of 24 positions shown; findings below may reference images not displayed]

FINDINGS: Contralateral Common Femoral Vein: Respiratory phasicity is normal
and symmetric with the symptomatic side. No evidence of thrombus.
Normal compressibility.

Common Femoral Vein: No evidence of thrombus. Normal
compressibility, respiratory phasicity and response to augmentation.

Saphenofemoral Junction: No evidence of thrombus. Normal
compressibility and flow on color Doppler imaging.

Profunda Femoral Vein: No evidence of thrombus. Normal
compressibility and flow on color Doppler imaging.

Femoral Vein: No evidence of thrombus. Normal compressibility,
respiratory phasicity and response to augmentation.

Popliteal Vein: No evidence of thrombus. Normal compressibility,
respiratory phasicity and response to augmentation.

Calf Veins: No evidence of thrombus. Normal compressibility and flow
on color Doppler imaging.

Superficial Great Saphenous Vein: No evidence of thrombus. Normal
compressibility and flow on color Doppler imaging.

Venous Reflux:  None.

Other Findings: Subcutaneous edema is noted within the calf. Small
joint effusion is seen in the knee joint.
IMPRESSION: No evidence of DVT within the right lower extremity.

Mild calf edema and small right knee joint effusion

## 2018-01-03 ENCOUNTER — Other Ambulatory Visit: Payer: Self-pay | Admitting: Nurse Practitioner

## 2018-01-03 ENCOUNTER — Other Ambulatory Visit: Payer: Self-pay | Admitting: Family Medicine

## 2018-01-04 ENCOUNTER — Ambulatory Visit: Payer: Self-pay | Admitting: Orthopaedic Surgery

## 2018-01-08 ENCOUNTER — Ambulatory Visit: Payer: BLUE CROSS/BLUE SHIELD | Admitting: Family Medicine

## 2018-01-08 ENCOUNTER — Encounter: Payer: Self-pay | Admitting: Family Medicine

## 2018-01-08 VITALS — BP 142/86 | Ht 65.5 in | Wt 168.8 lb

## 2018-01-08 DIAGNOSIS — R631 Polydipsia: Secondary | ICD-10-CM | POA: Diagnosis not present

## 2018-01-08 DIAGNOSIS — E1165 Type 2 diabetes mellitus with hyperglycemia: Secondary | ICD-10-CM | POA: Diagnosis not present

## 2018-01-08 LAB — POCT GLYCOSYLATED HEMOGLOBIN (HGB A1C): Hemoglobin A1C: 12.1 % — AB (ref 4.0–5.6)

## 2018-01-08 MED ORDER — BLOOD GLUCOSE MONITOR KIT: PACK | 0 refills | Status: DC

## 2018-01-08 MED ORDER — METFORMIN HCL 500 MG PO TABS: ORAL_TABLET | ORAL | 3 refills | Status: DC

## 2018-01-08 NOTE — Progress Notes (Signed)
   Subjective:    Patient ID: Laura Scott, female    DOB: 1961/01/24, 57 y.o.   MRN: [id number]  HPI  Patient arrives and stated she noticed she dad been extremely thirsty lately and a friend who is diabetic checked her sugar and it was close to 400.-patient states she is not diabetic.  Patient reports the last couple months she has not had her usual level of energy.  There is positive family history of diabetes  Patient had a normal glucose last summer when blood work  Last few weeks increased urination.  Increased thirst.  States appetite has been down somewhat.  Checked a glucose while on a cruise last week.  Sugars substantially elevated in the 400s.  Patient admits to drinking [facility] not watching her diet    Results for orders placed or performed in visit on 01/08/18  POCT glycosylated hemoglobin (Hb A1C)  Result Value Ref Range   Hemoglobin A1C 12.1 (A) 4.0 - 5.6 %   HbA1c, POC (prediabetic range)  5.7 - 6.4 %   HbA1c, POC (controlled diabetic range)  0.0 - 7.0 %   Fasting sugar 359- patient states she has only drank water today to keep her sugar down  Review of Systems No headache, no major weight loss or weight gain, no chest pain no back pain abdominal pain no change in bowel habits complete ROS otherwise negative     Objective:   Physical Exam   Alert and oriented, vitals reviewed and stable, NAD ENT-TM's and ext canals WNL bilat via otoscopic exam Soft palate, tonsils and post pharynx WNL via oropharyngeal exam Neck-symmetric, no masses; thyroid nonpalpable and nontender Pulmonary-no tachypnea or accessory muscle use; Clear without wheezes via auscultation Card--no abnrml murmurs, rhythm reg and rate WNL Carotid pulses symmetric, without bruits      Assessment & Plan:  1 impression new onset type 2 diabetes.  Discussed at length.  With high A1c and high sugars need to press on medication.  Initiate metformin side effects benefits discussed proper  dosing discussed with initiate monitoring.  Check sugar each morning.  Diet discussed.  Educational information given.  Monitor prescribed.  Long-term implications of diabetes discussed recheck in 2 weeks  Greater than 50% of this 25 minute face to face visit was spent in counseling and discussion and coordination of care regarding the above diagnosis/diagnosies

## 2018-01-08 NOTE — Patient Instructions (Addendum)
Type 2 Diabetes Mellitus, Diagnosis, Adult Type 2 diabetes (type 2 diabetes mellitus) is a long-term (chronic) disease. In type 2 diabetes, one or both of these problems may be present:  The pancreas does not make enough of a hormone called insulin.  Cells in the body do not respond properly to insulin that the body makes (insulin resistance).  Normally, insulin allows blood sugar (glucose) to enter cells in the body. The cells use glucose for energy. Insulin resistance or lack of insulin causes excess glucose to build up in the blood instead of going into cells. As a result, high blood glucose (hyperglycemia) develops. What increases the risk? The following factors may make you more likely to develop type 2 diabetes:  Having a family member with type 2 diabetes.  Being overweight or obese.  Having an inactive (sedentary) lifestyle.  Having been diagnosed with insulin resistance.  Having a history of prediabetes, gestational diabetes, or polycystic ovarian syndrome (PCOS).  Being of American-Indian, African-American, Hispanic/Latino, or Asian/Pacific Islander descent.  What are the signs or symptoms? In the early stage of this condition, you may not have symptoms. Symptoms develop slowly and may include:  Increased thirst (polydipsia).  Increased hunger(polyphagia).  Increased urination (polyuria).  Increased urination during the night (nocturia).  Unexplained weight loss.  Frequent infections that keep coming back (recurring).  Fatigue.  Weakness.  Vision changes, such as blurry vision.  Cuts or bruises that are slow to heal.  Tingling or numbness in the hands or feet.  Dark patches on the skin (acanthosis nigricans).  How is this diagnosed?  This condition is diagnosed based on your symptoms, your medical history, a physical exam, and your blood glucose level. Your blood glucose may be checked with one or more of the following blood tests:  A fasting blood  glucose (FBG) test. You will not be allowed to eat (you will fast) for at least 8 hours before a blood sample is taken.  A random blood glucose test. This checks blood glucose at any time of day regardless of when you ate.  An A1c (hemoglobin A1c) blood test. This provides information about blood glucose control over the previous 2-3 months.  An oral glucose tolerance test (OGTT). This measures your blood glucose at two times: ? After fasting. This is your baseline blood glucose level. ? Two hours after drinking a beverage that contains glucose.  You may be diagnosed with type 2 diabetes if:  Your FBG level is 126 mg/dL (7.0 mmol/L) or higher.  Your random blood glucose level is 200 mg/dL (11.1 mmol/L) or higher.  Your A1c level is 6.5% or higher.  Your OGGT result is higher than 200 mg/dL (11.1 mmol/L).  These blood tests may be repeated to confirm your diagnosis. How is this treated?  Your treatment may be managed by a specialist called an endocrinologist. Type 2 diabetes may be treated by following instructions from your health care provider about:  Making diet and lifestyle changes. This may include: ? Following an individualized nutrition plan that is developed by a diet and nutrition specialist (registered dietitian). ? Exercising regularly. ? Finding ways to manage stress.  Checking your blood glucose level as often as directed.  Taking diabetes medicines or insulin daily. This helps to keep your blood glucose levels in the healthy range. ? If you use insulin, you may need to adjust the dosage depending on how physically active you are and what foods you eat. Your health care provider will tell you how   to adjust your dosage.  Taking medicines to help prevent complications from diabetes, such as: ? Aspirin. ? Medicine to lower cholesterol. ? Medicine to control blood pressure.  Your health care provider will set individualized treatment goals for you. Your goals will be  based on your age, other medical conditions you have, and how you respond to diabetes treatment. Generally, the goal of treatment is to maintain the following blood glucose levels:  Diabetes Mellitus and Nutrition When you have diabetes (diabetes mellitus), it is very important to have healthy eating habits because your blood sugar (glucose) levels are greatly affected by what you eat and drink. Eating healthy foods in the appropriate amounts, at about the same times every day, can help you:  Control your blood glucose.  Lower your risk of heart disease.  Improve your blood pressure.  Reach or maintain a healthy weight.  Every person with diabetes is different, and each person has different needs for a meal plan. Your health care provider may recommend that you work with a diet and nutrition specialist (dietitian) to make a meal plan that is best for you. Your meal plan may vary depending on factors such as:  The calories you need.  The medicines you take.  Your weight.  Your blood glucose, blood pressure, and cholesterol levels.  Your activity level.  Other health conditions you have, such as heart or kidney disease.  How do carbohydrates affect me? Carbohydrates affect your blood glucose level more than any other type of food. Eating carbohydrates naturally increases the amount of glucose in your blood. Carbohydrate counting is a method for keeping track of how many carbohydrates you eat. Counting carbohydrates is important to keep your blood glucose at a healthy level, especially if you use insulin or take certain oral diabetes medicines. It is important to know how many carbohydrates you can safely have in each meal. This is different for every person. Your dietitian can help you calculate how many carbohydrates you should have at each meal and for snack. Foods that contain carbohydrates include:  Bread, cereal, rice, pasta, and crackers.  Potatoes and corn.  Peas, beans, and  lentils.  Milk and yogurt.  Fruit and juice.  Desserts, such as cakes, cookies, ice cream, and candy.  How does alcohol affect me? Alcohol can cause a sudden decrease in blood glucose (hypoglycemia), especially if you use insulin or take certain oral diabetes medicines. Hypoglycemia can be a life-threatening condition. Symptoms of hypoglycemia (sleepiness, dizziness, and confusion) are similar to symptoms of having too much alcohol. If your health care provider says that alcohol is safe for you, follow these guidelines:  Limit alcohol intake to no more than 1 drink per day for nonpregnant women and 2 drinks per day for men. One drink equals 12 oz of beer, 5 oz of wine, or 1 oz of hard liquor.  Do not drink on an empty stomach.  Keep yourself hydrated with water, diet soda, or unsweetened iced tea.  Keep in mind that regular soda, juice, and other mixers may contain a lot of sugar and must be counted as carbohydrates.  What are tips for following this plan? Reading food labels  Start by checking the serving size on the label. The amount of calories, carbohydrates, fats, and other nutrients listed on the label are based on one serving of the food. Many foods contain more than one serving per package.  Check the total grams (g) of carbohydrates in one serving. You can calculate the  number of servings of carbohydrates in one serving by dividing the total carbohydrates by 15. For example, if a food has 30 g of total carbohydrates, it would be equal to 2 servings of carbohydrates.  Check the number of grams (g) of saturated and trans fats in one serving. Choose foods that have low or no amount of these fats.  Check the number of milligrams (mg) of sodium in one serving. Most people should limit total sodium intake to less than 2,300 mg per day.  Always check the nutrition information of foods labeled as "low-fat" or "nonfat". These foods may be higher in added sugar or refined carbohydrates  and should be avoided.  Talk to your dietitian to identify your daily goals for nutrients listed on the label. Shopping  Avoid buying canned, premade, or processed foods. These foods tend to be high in fat, sodium, and added sugar.  Shop around the outside edge of the grocery store. This includes fresh fruits and vegetables, bulk grains, fresh meats, and fresh dairy. Cooking  Use low-heat cooking methods, such as baking, instead of high-heat cooking methods like deep frying.  Cook using healthy oils, such as olive, canola, or sunflower oil.  Avoid cooking with butter, cream, or high-fat meats. Meal planning  Eat meals and snacks regularly, preferably at the same times every day. Avoid going long periods of time without eating.  Eat foods high in fiber, such as fresh fruits, vegetables, beans, and whole grains. Talk to your dietitian about how many servings of carbohydrates you can eat at each meal.  Eat 4-6 ounces of lean protein each day, such as lean meat, chicken, fish, eggs, or tofu. 1 ounce is equal to 1 ounce of meat, chicken, or fish, 1 egg, or 1/4 cup of tofu.  Eat some foods each day that contain healthy fats, such as avocado, nuts, seeds, and fish. Lifestyle   Check your blood glucose regularly.  Exercise at least 30 minutes 5 or more days each week, or as told by your health care provider.  Take medicines as told by your health care provider.  Do not use any products that contain nicotine or tobacco, such as cigarettes and e-cigarettes. If you need help quitting, ask your health care provider.  Work with a Social worker or diabetes educator to identify strategies to manage stress and any emotional and social challenges. What are some questions to ask my health care provider?  Do I need to meet with a diabetes educator?  Do I need to meet with a dietitian?  What number can I call if I have questions?  When are the best times to check my blood glucose? Where to find  more information:  American Diabetes Association: diabetes.org/food-and-fitness/food  Academy of Nutrition and Dietetics: PokerClues.dk  [employer] of Diabetes and Digestive and Kidney Diseases (NIH): ContactWire.be Summary  A healthy meal plan will help you control your blood glucose and maintain a healthy lifestyle.  Working with a diet and nutrition specialist (dietitian) can help you make a meal plan that is best for you.  Keep in mind that carbohydrates and alcohol have immediate effects on your blood glucose levels. It is important to count carbohydrates and to use alcohol carefully. This information is not intended to replace advice given to you by your health care provider. Make sure you discuss any questions you have with your health care provider. Document Released: 05/05/2005 Document Revised: 09/12/2016 Document Reviewed: 09/12/2016 Elsevier Interactive Patient Education  [employer].  Before  meals (preprandial): 80-130 mg/dL (4.4-7.2 mmol/L).  After meals (postprandial): below 180 mg/dL (10 mmol/L).  A1c level: less than 7%.  Follow these instructions at home: Questions to [facility] Provider Consider asking the following questions:  Do I need to meet with a diabetes educator?  Where can I find a support group for people with diabetes?  What equipment will I need to manage my diabetes at home?  What diabetes medicines do I need, and when should I take them?  How often do I need to check my blood glucose?  What number can I call if I have questions?  When is my next appointment?  General instructions  Take over-the-counter and prescription medicines only as told by your health care provider.  Keep all follow-up visits as told by your health care provider. This is important.  For more information about diabetes,  visit: ? American Diabetes Association (ADA): www.diabetes.org ? American Association of Diabetes Educators (AADE): www.diabeteseducator.org/patient-resources Contact a health care provider if:  Your blood glucose is at or above 240 mg/dL (13.3 mmol/L) for 2 days in a row.  You have been sick or have had a fever for 2 days or longer and you are not getting better.  You have any of the following problems for more than 6 hours: ? You cannot eat or drink. ? You have nausea and vomiting. ? You have diarrhea. Get help right away if:  Your blood glucose is lower than 54 mg/dL (3.0 mmol/L).  You become confused or you have trouble thinking clearly.  You have difficulty breathing.  You have moderate or large ketone levels in your urine. This information is not intended to replace advice given to you by your health care provider. Make sure you discuss any questions you have with your health care provider. Document Released: 08/08/2005 Document Revised: 01/14/2016 Document Reviewed: 09/11/2015 Elsevier Interactive Patient Education  2018 [place]. ++++++++++++++++++++++++++++++   +  + ++ + + + ++

## 2018-01-10 ENCOUNTER — Encounter: Payer: Self-pay | Admitting: Orthopaedic Surgery

## 2018-01-10 ENCOUNTER — Ambulatory Visit: Payer: BLUE CROSS/BLUE SHIELD | Admitting: Orthopaedic Surgery

## 2018-01-10 ENCOUNTER — Telehealth: Payer: Self-pay | Admitting: Family Medicine

## 2018-01-10 VITALS — BP 157/77 | HR 129 | Ht 67.0 in | Wt 166.0 lb

## 2018-01-10 DIAGNOSIS — G8929 Other chronic pain: Secondary | ICD-10-CM

## 2018-01-10 DIAGNOSIS — G894 Chronic pain syndrome: Secondary | ICD-10-CM | POA: Diagnosis not present

## 2018-01-10 DIAGNOSIS — M25561 Pain in right knee: Secondary | ICD-10-CM

## 2018-01-10 DIAGNOSIS — M25562 Pain in left knee: Secondary | ICD-10-CM

## 2018-01-10 MED ORDER — LOSARTAN POTASSIUM 50 MG PO TABS: 50.0000 mg | ORAL_TABLET | Freq: Every day | ORAL | 1 refills | Status: DC

## 2018-01-10 MED ORDER — HYDROCHLOROTHIAZIDE 25 MG PO TABS: ORAL_TABLET | ORAL | 1 refills | Status: DC

## 2018-01-10 NOTE — Telephone Encounter (Signed)
Patient is aware we have sent in rx's.

## 2018-01-10 NOTE — Progress Notes (Signed)
CC:  My knees are better  She is much improved since the injections in February.  She is walking well and having little problem.  NV intact. ROM of the knees is full.  Gait is normal.  She has no effusion.  Encounter Diagnoses  Name Primary?  . Chronic pain of left knee Yes  . Chronic pain of right knee   . Chronic pain syndrome    I will see her as needed.  Call if any problem.  Precautions discussed.   Electronically Signed Sanjuana Kava, MD 5/22/20192:19 PM

## 2018-01-10 NOTE — Telephone Encounter (Signed)
Requesting Rx for in for Losartan and hydrochlorothiazide 90 day supply.  She said she just picked up Rx for both at the pharmacy and we only sent in 15 pills.  [employer]

## 2018-01-12 ENCOUNTER — Telehealth: Payer: Self-pay | Admitting: *Deleted

## 2018-01-12 MED ORDER — GLIPIZIDE ER 5 MG PO TB24: 5.0000 mg | ORAL_TABLET | Freq: Every day | ORAL | 1 refills | Status: DC

## 2018-01-12 NOTE — Telephone Encounter (Signed)
Patient was diagnosed with new onset type 2 diabetes on 5/20 with sugars running 300-400 and A1c of 9.2. Patient was started on Metformin and states the Metformin is making her feel sick. Patient also reports she really hasn't been eating hardly anything because she doesn't want her sugar to go up when she eats- I advised the patient that she still needs to make sure she eats balanced diet but to avoid carbs and sugar.

## 2018-01-12 NOTE — Telephone Encounter (Signed)
Patient called stating the blood pressure medicine is making her sick. Please advise

## 2018-01-12 NOTE — Telephone Encounter (Signed)
Patient does have a glucometer and her sugars are running 300-400 fasting so she hasn't been eating in order to keep sugar from rising-just drinking water and metformin.

## 2018-01-12 NOTE — Telephone Encounter (Signed)
Number #1 does patient have a glucometer to check what her sugars are doing currently?  #2 Metformin is a generic medication.  Typically when this is not tolerated because of nausea then we have to go to other medications.  This is not an easy process.  Glipizide can help with sugars but we need to know what her numbers are currently.  If she does not know her numbers then we can start a medication and have her follow-up next week

## 2018-01-12 NOTE — Telephone Encounter (Signed)
Patient is aware of all she is aware to d/c metformin and start Glipizide xl sched to see Dr.Steve Luking next week. Aware to check sugars bid.

## 2018-01-12 NOTE — Telephone Encounter (Signed)
I would recommend starting glipizide XL 5 mg 1 daily she may go ahead and start today.  It is fine for her to eat but she just needs to be sensible and avoid excessive starches I would like for the patient for the next several days to check her sugar twice daily morning and pre-supper I also would like the patient to follow-up with Dr. Richardson Landry in the middle of the week thank you number 30 pills with 1 refill

## 2018-01-16 ENCOUNTER — Ambulatory Visit: Payer: BLUE CROSS/BLUE SHIELD | Admitting: Family Medicine

## 2018-01-16 ENCOUNTER — Encounter: Payer: Self-pay | Admitting: Family Medicine

## 2018-01-16 VITALS — BP 130/84 | HR 90 | Ht 67.0 in | Wt 169.8 lb

## 2018-01-16 DIAGNOSIS — E1165 Type 2 diabetes mellitus with hyperglycemia: Secondary | ICD-10-CM | POA: Diagnosis not present

## 2018-01-16 MED ORDER — GLIPIZIDE ER 5 MG PO TB24: 5.0000 mg | ORAL_TABLET | Freq: Every day | ORAL | 5 refills | Status: DC

## 2018-01-16 NOTE — Progress Notes (Signed)
   Subjective:    Patient ID: Laura Scott, female    DOB: [birth year], 57 y.o.   MRN: [id number]  Diabetes  She presents for her follow-up diabetic visit. Hypoglycemia symptoms include headaches. Associated symptoms include blurred vision, fatigue and visual change. There are no hypoglycemic complications.  Pt here for follow up from office visit on 01/08/2018.  METFORMIN STOPPED, SWITCH TO GLIP XL DUE TO gi side effects   Working hard on diet nd trying hard  b 12 and b 6 , wondes if can take     Pt also states her heart rate has been high., elevated at times, h r up . Pt notices it a bit      Review of Systems  Constitutional: Positive for fatigue.  Eyes: Positive for blurred vision.  Neurological: Positive for headaches.       Objective:   Physical Exam  Alert vitals stable, NAD. Blood pressure good on repeat. HEENT normal. Lungs clear. Heart regular rate and rhythm.       Assessment & Plan:  Impression type 2 diabetes.  Patient unfortunately could not handle metformin.  Discussion held.  Patient to maintain Glucotrol XL 5 mg daily.  To call in 1 to 2 weeks with numbers.  May need to increase the dose.  Follow-up as scheduled in several months.  Encouraged to do educational session at hospital

## 2018-01-16 NOTE — Patient Instructions (Signed)
plz call next week with fasting sugar levels so we can deterine whether current dose is proper  +   +

## 2018-01-22 ENCOUNTER — Ambulatory Visit: Payer: BLUE CROSS/BLUE SHIELD | Admitting: Family Medicine

## 2018-01-24 ENCOUNTER — Telehealth: Payer: Self-pay | Admitting: Family Medicine

## 2018-01-24 NOTE — Telephone Encounter (Signed)
Patient was told to call in with sugar levels for the past week.  AM-167 PM-135 AM-181 PM-143 AM-141 PM-134 AM-188 PM-169 AM(today)-124

## 2018-01-25 ENCOUNTER — Other Ambulatory Visit: Payer: Self-pay | Admitting: Family Medicine

## 2018-01-25 NOTE — Telephone Encounter (Signed)
Left message to return call 

## 2018-01-25 NOTE — Telephone Encounter (Signed)
Since overal improving, cst and call in one wk with levels

## 2018-01-26 NOTE — Telephone Encounter (Signed)
Pt returned call, verbalized understanding. Stated she needed more test strips. Strip were sent to pharmacy yesterday.

## 2018-01-31 ENCOUNTER — Other Ambulatory Visit: Payer: Self-pay | Admitting: Gastroenterology

## 2018-02-07 ENCOUNTER — Other Ambulatory Visit: Payer: Self-pay

## 2018-02-07 ENCOUNTER — Telehealth: Payer: Self-pay | Admitting: Family Medicine

## 2018-02-07 MED ORDER — GLIPIZIDE ER 10 MG PO TB24: 10.0000 mg | ORAL_TABLET | Freq: Every day | ORAL | 5 refills | Status: DC

## 2018-02-07 NOTE — Telephone Encounter (Signed)
I called and left a message asked that she r/c. Medication sent to [facility].

## 2018-02-07 NOTE — Telephone Encounter (Signed)
Patient called to give updates on her blood sugar readings.  She said starting on 6/16 - 140 135 159 138 155 180.

## 2018-02-07 NOTE — Telephone Encounter (Signed)
Patient is aware 

## 2018-02-07 NOTE — Telephone Encounter (Signed)
Ok still up, incr to 10 mg daily, no need to call us with further numbers should stay on thisfdose, f u as sched

## 2018-02-19 ENCOUNTER — Other Ambulatory Visit: Payer: Self-pay | Admitting: Family Medicine

## 2018-02-20 ENCOUNTER — Other Ambulatory Visit: Payer: Self-pay

## 2018-02-20 MED ORDER — GLUCOSE BLOOD VI STRP: ORAL_STRIP | 0 refills | Status: DC

## 2018-02-27 ENCOUNTER — Encounter: Payer: Self-pay | Admitting: Family Medicine

## 2018-02-27 ENCOUNTER — Ambulatory Visit (INDEPENDENT_AMBULATORY_CARE_PROVIDER_SITE_OTHER): Payer: BLUE CROSS/BLUE SHIELD | Admitting: Family Medicine

## 2018-02-27 VITALS — BP 120/78 | Ht 67.0 in | Wt 169.0 lb

## 2018-02-27 DIAGNOSIS — I10 Essential (primary) hypertension: Secondary | ICD-10-CM

## 2018-02-27 DIAGNOSIS — E1165 Type 2 diabetes mellitus with hyperglycemia: Secondary | ICD-10-CM | POA: Diagnosis not present

## 2018-02-27 MED ORDER — LOSARTAN POTASSIUM 50 MG PO TABS: 50.0000 mg | ORAL_TABLET | Freq: Every day | ORAL | 1 refills | Status: DC

## 2018-02-27 MED ORDER — GABAPENTIN 300 MG PO CAPS: ORAL_CAPSULE | ORAL | 5 refills | Status: DC

## 2018-02-27 MED ORDER — MICROLET LANCETS MISC: 5 refills | Status: DC

## 2018-02-27 MED ORDER — HYDROCHLOROTHIAZIDE 25 MG PO TABS
ORAL_TABLET | ORAL | 1 refills | Status: DC
Start: 2018-02-27 — End: 2018-10-03

## 2018-02-27 MED ORDER — ESCITALOPRAM OXALATE 10 MG PO TABS: 10.0000 mg | ORAL_TABLET | Freq: Every day | ORAL | 5 refills | Status: DC

## 2018-02-27 MED ORDER — OMEPRAZOLE 40 MG PO CPDR: 40.0000 mg | DELAYED_RELEASE_CAPSULE | Freq: Every day | ORAL | 3 refills | Status: DC

## 2018-02-27 MED ORDER — FLUTICASONE PROPIONATE 50 MCG/ACT NA SUSP: 2.0000 | Freq: Every day | NASAL | 5 refills | Status: DC

## 2018-02-27 MED ORDER — GLUCOSE BLOOD VI STRP: ORAL_STRIP | 5 refills | Status: DC

## 2018-02-27 NOTE — Progress Notes (Signed)
   Subjective:    Patient ID: Laura Scott, female    DOB: 1961-01-28, 57 y.o.   MRN: [id number]  HPI  Patient is here today to for a six week follow up on her chronic illnesses.She states it is a follow up on [name]. Her last A1C was here on 01/08/2018 at 12.1.She is eating healthy and exercising.Need a refill omeprazole. She also wants to know how often she needs to check her blood sugar.  Checks   Morn numbers around 114   Sometimes numbers drio in to the 70 s   Eating the right diet, at first did not have a great appetite, has cut down sweets  Patient claims compliance with diabetes medication. No obvious side effects. Reports no substantial low sugar spells. Most numbers are generally in good range when checked fasting. Generally does not miss a dose of medication. Watching diabetic diet closely  Blood pressure medicine and blood pressure levels reviewed today with patient. Compliant with blood pressure medicine. States does not miss a dose. No obvious side effects. Blood pressure generally good when checked elsewhere. Watching salt intake.   Was on lexapro but then stopped it      Review of Systems No headache, no major weight loss or weight gain, no chest pain no back pain abdominal pain no change in bowel habits complete ROS otherwise negative     Objective:   Physical Exam  Alert vitals stable, NAD. Blood pressure good on repeat. HEENT normal. Lungs clear. Heart regular rate and rhythm.       Assessment & Plan:  Impression 1 type 2 diabetes control improving discussed to maintain same medications diet exercise discussed  2.  Hypertension control also improving maintain meds compliance discussed and encouraged  Follow-up in several months

## 2018-04-07 IMAGING — MG DIGITAL SCREENING BILATERAL MAMMOGRAM WITH CAD
4 series · 4 of 4 positions shown · non-contrast
Comparison: Previous exam(s).

CLINICAL DATA: Screening.

EXAM:
DIGITAL SCREENING BILATERAL MAMMOGRAM WITH CAD

[R CC]
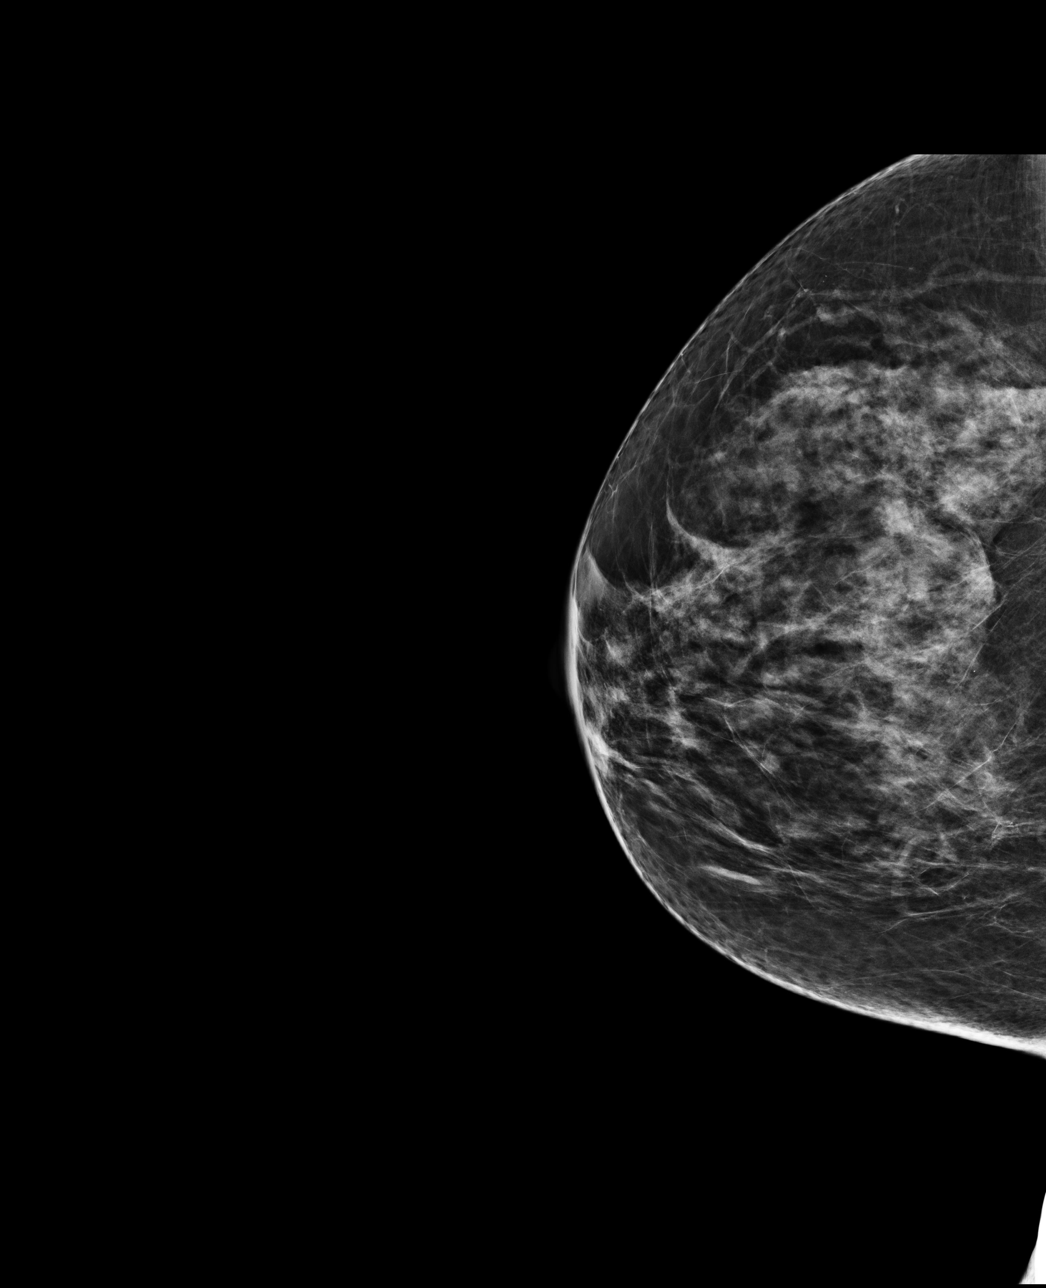

[L MLO]
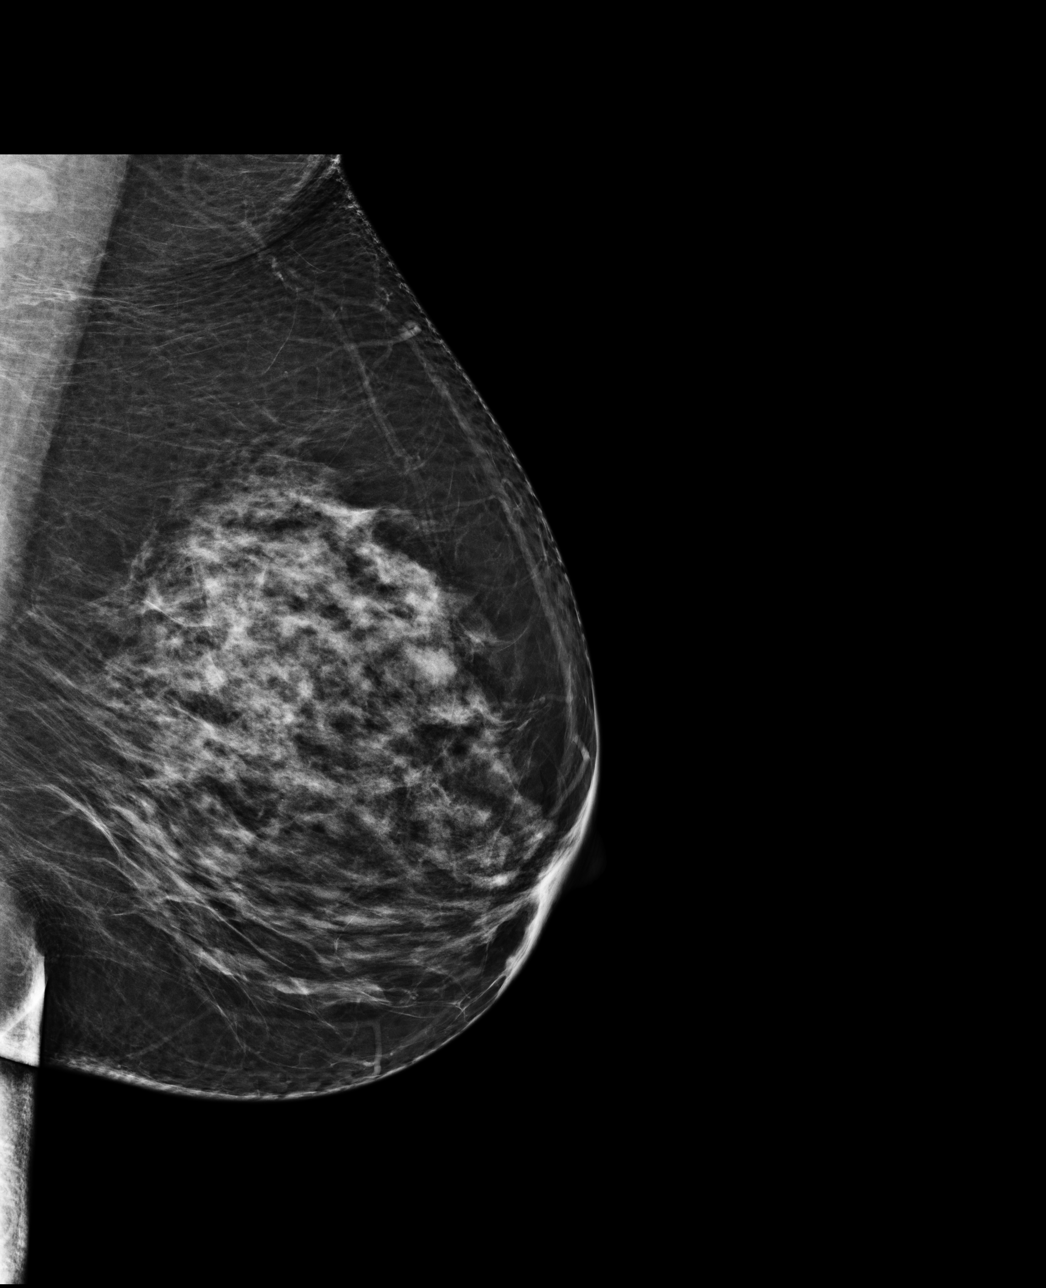

[R MLO]
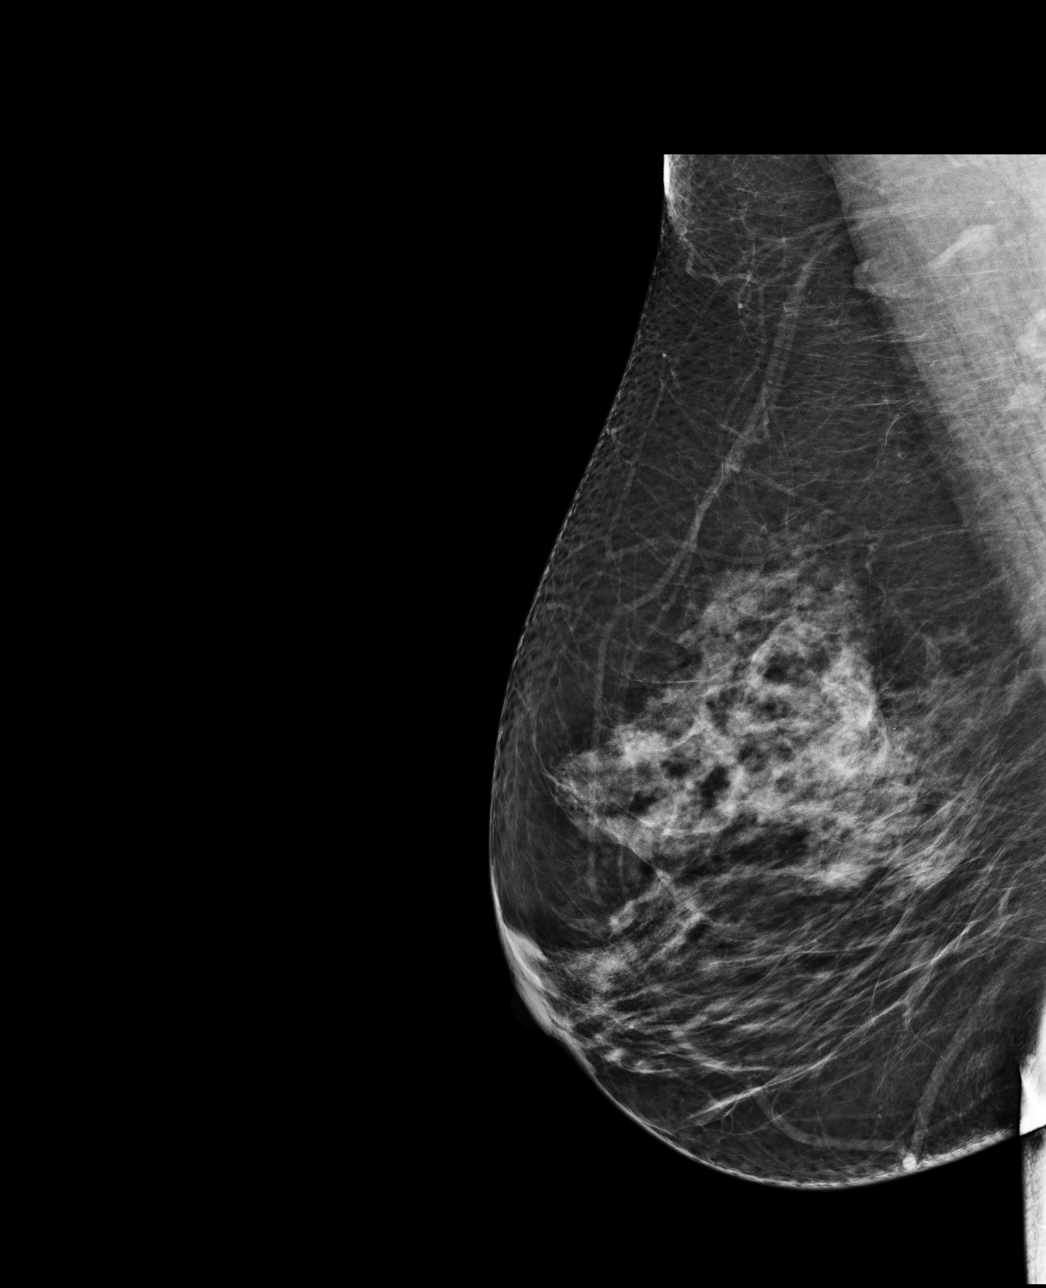

[L CC]
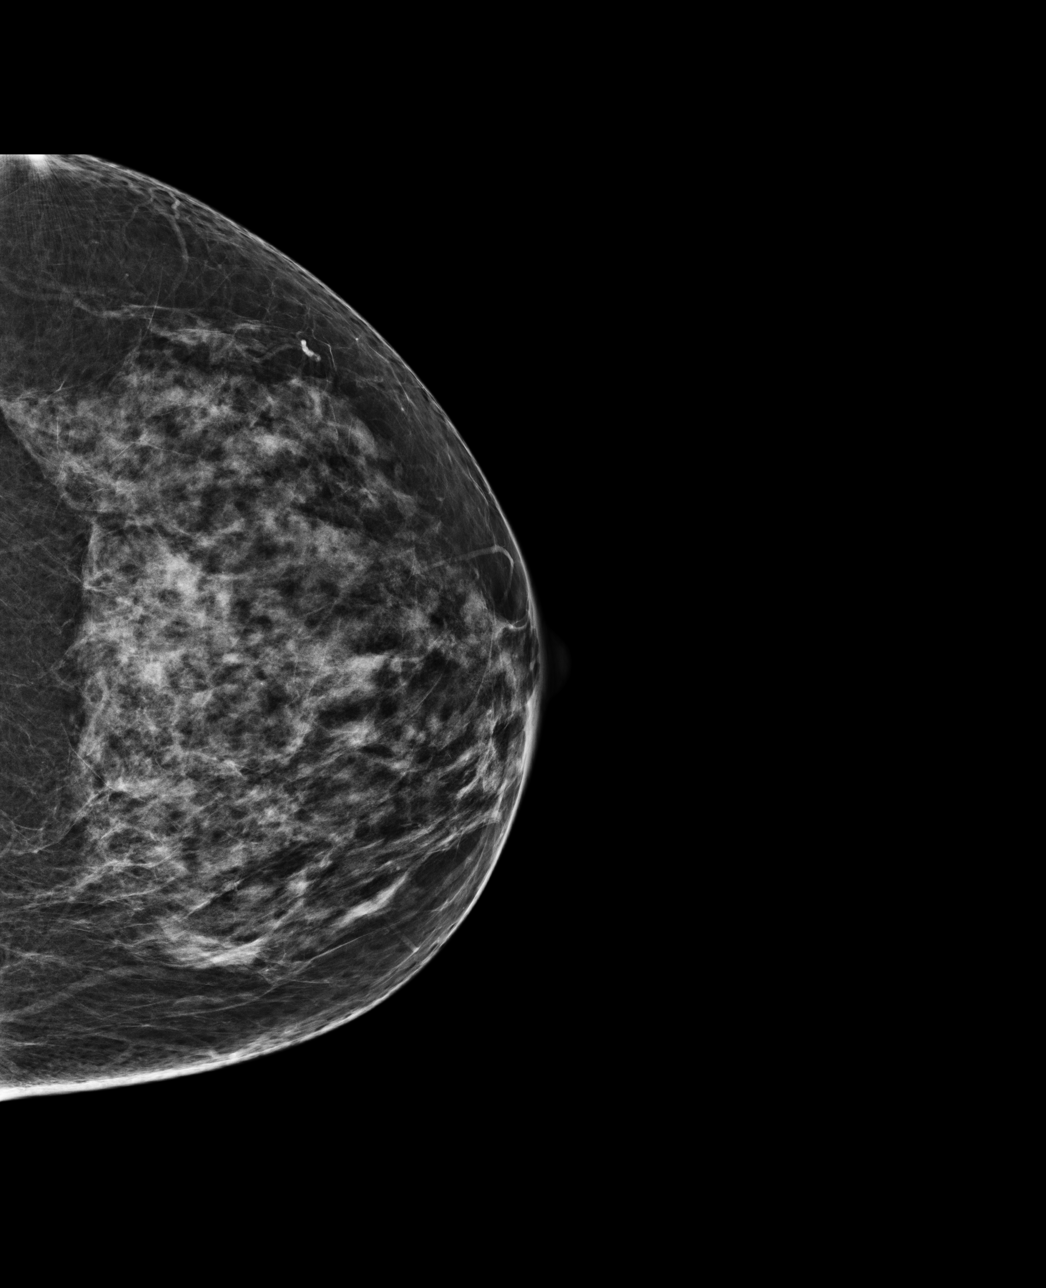

[4 of 4 positions shown; findings below may reference images not displayed]

ACR Breast Density Category c: The breast tissue is heterogeneously
dense, which may obscure small masses.
FINDINGS: There are no findings suspicious for malignancy. Images were
processed with CAD.
IMPRESSION: No mammographic evidence of malignancy. A result letter of this
screening mammogram will be mailed directly to the patient.

RECOMMENDATION:
Screening mammogram in one year. (Code:[id number])

BI-RADS CATEGORY  1: Negative.

## 2018-04-26 ENCOUNTER — Ambulatory Visit: Payer: BLUE CROSS/BLUE SHIELD | Admitting: Orthopaedic Surgery

## 2018-04-26 ENCOUNTER — Encounter: Payer: Self-pay | Admitting: Orthopaedic Surgery

## 2018-04-26 VITALS — BP 128/61 | HR 85 | Temp 98.1°F | Ht 66.0 in | Wt 169.0 lb

## 2018-04-26 DIAGNOSIS — G8929 Other chronic pain: Secondary | ICD-10-CM | POA: Diagnosis not present

## 2018-04-26 DIAGNOSIS — G894 Chronic pain syndrome: Secondary | ICD-10-CM

## 2018-04-26 DIAGNOSIS — M25561 Pain in right knee: Secondary | ICD-10-CM

## 2018-04-26 NOTE — Progress Notes (Signed)
CC:  I have pain of my right knee. I would like an injection.  The patient has chronic pain of the right knee.  There is no recent trauma.  There is no redness.  Injections in the past have helped.  The knee has no redness, has an effusion and crepitus present.  ROM of the right knee is 0-110.  Impression:  Chronic knee pain right  Return: 1 month  PROCEDURE NOTE:  The patient requests injections of the right knee , verbal consent was obtained.  The right knee was prepped appropriately after time out was performed.   Sterile technique was observed and injection of 1 cc of Depo-Medrol 40 mg with several cc's of plain xylocaine. Anesthesia was provided by ethyl chloride and a 20-gauge needle was used to inject the knee area. The injection was tolerated well.  A band aid dressing was applied.  The patient was advised to apply ice later today and tomorrow to the injection sight as needed.  Electronically Signed Sanjuana Kava, MD 9/5/20199:15 AM

## 2018-05-04 ENCOUNTER — Telehealth: Payer: Self-pay | Admitting: Family Medicine

## 2018-05-04 MED ORDER — GLUCOSE BLOOD VI STRP: ORAL_STRIP | 5 refills | Status: DC

## 2018-05-04 NOTE — Telephone Encounter (Signed)
Please advise 

## 2018-05-04 NOTE — Telephone Encounter (Signed)
Ref prn, I rec no more than once per day, most insur co will not cover more if not on insulin

## 2018-05-04 NOTE — Telephone Encounter (Signed)
Patient needs refill for glucose blood (CONTOUR NEXT TEST) test strip instructed to take reading once a day pt stated she sometimes has to take sugar twice a day (not all the time). Patient is wondering if she could have the prescription wrote differently so she wouldn't run out as fast when she does have to check her glucose more than once a day.  Pt was last seen 02/27/18 with Dr. Richardson Landry and has a follow up appt scheduled for 05/31/18 with Dr.Steve for 3 month follow up.  Selected Pharmacy: [facility], [place]

## 2018-05-04 NOTE — Telephone Encounter (Signed)
Patient is aware. And refills sent to requested Pharmacy.

## 2018-05-15 ENCOUNTER — Other Ambulatory Visit: Payer: Self-pay | Admitting: Family Medicine

## 2018-05-15 NOTE — Telephone Encounter (Signed)
Ok six mo total

## 2018-05-21 ENCOUNTER — Other Ambulatory Visit: Payer: Self-pay | Admitting: Family Medicine

## 2018-05-22 NOTE — Telephone Encounter (Signed)
Ok thre e mo worth 

## 2018-05-31 ENCOUNTER — Encounter: Payer: Self-pay | Admitting: Family Medicine

## 2018-05-31 ENCOUNTER — Ambulatory Visit: Payer: BLUE CROSS/BLUE SHIELD | Admitting: Family Medicine

## 2018-05-31 VITALS — BP 148/78 | Ht 67.0 in | Wt 178.2 lb

## 2018-05-31 DIAGNOSIS — Z23 Encounter for immunization: Secondary | ICD-10-CM

## 2018-05-31 DIAGNOSIS — E1165 Type 2 diabetes mellitus with hyperglycemia: Secondary | ICD-10-CM

## 2018-05-31 DIAGNOSIS — I10 Essential (primary) hypertension: Secondary | ICD-10-CM

## 2018-05-31 LAB — POCT GLYCOSYLATED HEMOGLOBIN (HGB A1C): Hemoglobin A1C: 5 % (ref 4.0–5.6)

## 2018-05-31 MED ORDER — GLIPIZIDE ER 5 MG PO TB24: 5.0000 mg | ORAL_TABLET | Freq: Every day | ORAL | 5 refills | Status: DC

## 2018-05-31 MED ORDER — ALPRAZOLAM 0.5 MG PO TABS: ORAL_TABLET | ORAL | 5 refills | Status: DC

## 2018-05-31 NOTE — Progress Notes (Deleted)
   Subjective:    Patient ID: Laura Scott, female    DOB: 04/29/1961, 57 y.o.   MRN: [id number]  HPI    Review of Systems     Objective:   Physical Exam        Assessment & Plan:

## 2018-05-31 NOTE — Progress Notes (Signed)
   Subjective:    Patient ID: Laura Scott, female    DOB: October 03, 1960, 57 y.o.   MRN: [id number]   Diabetes   She presents for her follow-up diabetic visit. She has type 2 diabetes mellitus. Risk factors for coronary artery disease include diabetes mellitus and hypertension. Current diabetic treatment includes oral agent (monotherapy). She is compliant with treatment all of the time. Her weight is stable. She is following a diabetic diet.  Patient states her sugars drop low at times  Patient also has knot on her stomach for one month.  Results for orders placed or performed in visit on 05/31/18  POCT glycosylated hemoglobin (Hb A1C)  Result Value Ref Range   Hemoglobin A1C 5.0 4.0 - 5.6 %   HbA1c POC (<> result, manual entry)     HbA1c, POC (prediabetic range)     HbA1c, POC (controlled diabetic range)     Blood pressure medicine and blood pressure levels reviewed today with patient. Compliant with blood pressure medicine. States does not miss a dose. No obvious side effects. Blood pressure generally good when checked elsewhere. Watching salt intake.  Worse at night   On disability because of back   Pt uses acid reflux meds prn  Also has gas issues  Feels a know wonders the cause near a bruiee       Review of Systems No headache, no major weight loss or weight gain, no chest pain no back pain abdominal pain no change in bowel habits complete ROS otherwise negative     Objective:   Physical Exam  Alert and oriented, vitals reviewed and stable, NAD ENT-TM's and ext canals WNL bilat via otoscopic exam Soft palate, tonsils and post pharynx WNL via oropharyngeal exam Neck-symmetric, no masses; thyroid nonpalpable and nontender Pulmonary-no tachypnea or accessory muscle use; Clear without wheezes via auscultation Card--no abnrml murmurs, rhythm reg and rate WNL Carotid pulses symmetric, without bruits Left lower abdomen palpable subcutaneous cyst in the region of bruise  freely movable soft      Assessment & Plan:  Impression type 2 diabetes.  Controlled too tight discussed we will get call from medication rationale discussed.  2.  Hypertension good control discussed maintain same meds  3.  Subcutaneous cyst left lower abdominal wall do not recommend excision rationale discussed  Flu shot.  Follow-up in 6 months diet exercise discussed

## 2018-05-31 NOTE — Progress Notes (Deleted)
  Subjective:     Patient ID: Laura Scott, female   DOB: [birth year], 57 y.o.   MRN: [id number]  HPI   Review of Systems     Objective:   Physical Exam     Assessment:     ***    Plan:     ***

## 2018-06-18 ENCOUNTER — Ambulatory Visit: Payer: BLUE CROSS/BLUE SHIELD | Admitting: Family Medicine

## 2018-06-18 ENCOUNTER — Other Ambulatory Visit: Payer: Self-pay

## 2018-06-18 ENCOUNTER — Encounter: Payer: Self-pay | Admitting: Family Medicine

## 2018-06-18 ENCOUNTER — Other Ambulatory Visit (HOSPITAL_COMMUNITY): Payer: Self-pay | Admitting: Family Medicine

## 2018-06-18 VITALS — BP 140/67 | HR 95 | Ht 66.0 in | Wt 180.0 lb

## 2018-06-18 DIAGNOSIS — N63 Unspecified lump in unspecified breast: Secondary | ICD-10-CM | POA: Diagnosis not present

## 2018-06-18 NOTE — Progress Notes (Signed)
   GYNECOLOGY PROBLEM  VISIT ENCOUNTER NOTE  Subjective:   Laura Scott is a 57 y.o. No obstetric history on file. female here for a a problem GYN visit.  Current complaints:  Chief Complaint  Patient presents with  . Breast Mass    left breast    Patient with normal mammogram (birads 1) in 08/2017. Was performing slef breast exam and noted "pea-sized" lump in her left breast located at ~3 oclock. Never noticed before. Not associated with pain, nipple discharge or skin changes.    Denies abnormal vaginal bleeding, discharge, pelvic pain, problems with intercourse or other gynecologic concerns.    The following portions of the patient's history were reviewed and updated as appropriate: allergies, current medications, past family history, past medical history, past social history, past surgical history and problem list.  Review of Systems Pertinent items are noted in HPI.   Objective:  BP 140/67 (BP Location: Right Arm, Patient Position: Sitting, Cuff Size: Normal)   Pulse 95   Ht 5\' 6"  (1.676 m)   Wt 180 lb (81.6 kg)   LMP 09/03/2013   BMI 29.05 kg/m   Gen: well appearing, NAD HEENT: no scleral icterus CV: RR Lung: Normal WOB Ext: warm well perfused  BREASTS: Symmetric in size.  Right: No masses, skin changes, nipple drainage, or lymphadenopathy. Left: <0.5 cm mass felt at the 3 o'clock position. No nipple retraction, discharge or LAD on left axilla.     Assessment and Plan:  1. Breast mass in female Most likely benign but given age will get appropriate work up.  - MM Digital Diagnostic Unilat L; Future - US BREAST LTD UNI LEFT INC AXILLA; Future   Please refer to After Visit Summary for other counseling recommendations.   Return in about 3 months (around 09/18/2018), or if symptoms worsen or fail to improve.  Caren Macadam, MD, MPH, ABFM Attending [place] for [facility]

## 2018-06-26 ENCOUNTER — Ambulatory Visit (HOSPITAL_COMMUNITY)
Admission: RE | Admit: 2018-06-26 | Discharge: 2018-06-26 | Disposition: A | Discharge status: Home / Self Care | Payer: BLUE CROSS/BLUE SHIELD | Source: Ambulatory Visit | Attending: Family Medicine | Admitting: Family Medicine

## 2018-06-26 ENCOUNTER — Other Ambulatory Visit (HOSPITAL_COMMUNITY): Payer: BLUE CROSS/BLUE SHIELD

## 2018-06-26 DIAGNOSIS — N63 Unspecified lump in unspecified breast: Secondary | ICD-10-CM

## 2018-08-20 ENCOUNTER — Ambulatory Visit: Payer: BLUE CROSS/BLUE SHIELD | Admitting: Family Medicine

## 2018-08-20 ENCOUNTER — Encounter: Payer: Self-pay | Admitting: Family Medicine

## 2018-08-20 VITALS — BP 148/82 | Ht 66.0 in | Wt 183.4 lb

## 2018-08-20 DIAGNOSIS — I10 Essential (primary) hypertension: Secondary | ICD-10-CM

## 2018-08-20 DIAGNOSIS — Z79899 Other long term (current) drug therapy: Secondary | ICD-10-CM

## 2018-08-20 DIAGNOSIS — Z23 Encounter for immunization: Secondary | ICD-10-CM

## 2018-08-20 DIAGNOSIS — F329 Major depressive disorder, single episode, unspecified: Secondary | ICD-10-CM

## 2018-08-20 DIAGNOSIS — F419 Anxiety disorder, unspecified: Secondary | ICD-10-CM

## 2018-08-20 DIAGNOSIS — E1165 Type 2 diabetes mellitus with hyperglycemia: Secondary | ICD-10-CM | POA: Diagnosis not present

## 2018-08-20 DIAGNOSIS — F32A Depression, unspecified: Secondary | ICD-10-CM

## 2018-08-20 MED ORDER — ESCITALOPRAM OXALATE 20 MG PO TABS: 20.0000 mg | ORAL_TABLET | Freq: Every day | ORAL | 5 refills | Status: DC

## 2018-08-20 MED ORDER — GLUCOSE BLOOD VI STRP: ORAL_STRIP | 5 refills | Status: DC

## 2018-08-20 NOTE — Progress Notes (Signed)
   Subjective:    Patient ID: Laura Scott, female    DOB: 1960/11/02, 57 y.o.   MRN: [id number]  Diabetes  She presents for her follow-up diabetic visit. She has type 2 diabetes mellitus. Risk factors for coronary artery disease include diabetes mellitus, hypertension and post-menopausal. Current diabetic treatment includes oral agent (monotherapy). She is compliant with treatment all of the time. Her weight is stable. She is following a diabetic diet.   Discuss increasing lexapro Get orders for routine labs   Patient claims compliance with diabetes medication. No obvious side effects. Reports no substantial low sugar spells. Most numbers are generally in good range when checked fasting. Generally does not miss a dose of medication. Watching diabetic diet closely  Blood pressure medicine and blood pressure levels reviewed today with patient. Compliant with blood pressure medicine. States does not miss a dose. No obvious side effects. Blood pressure generally good when checked elsewhere. Watching salt intake.   Patient continues to take lipid medication regularly. No obvious side effects from it. Generally does not miss a dose. Prior blood work results are reviewed with patient. Patient continues to work on fat intake in diet  Patient notes ongoing compliance with antidepressant medication. No obvious side effects. Reports does not miss a dose. Overall continues to help depression substantially. No thoughts of homicide or suicide. Would like to maintain medication.   Numbers on the low sie   No majpr ;pw s[els   Pt having more stress, feels down at times,   And anxiety up some, notes a higher dose  Of the lexapro  May well help   Review of Systems No headache, no major weight loss or weight gain, no chest pain no back pain abdominal pain no change in bowel habits complete ROS otherwise negative     Objective:   Physical Exam Alert and oriented, vitals reviewed and stable,  NAD ENT-TM's and ext canals WNL bilat via otoscopic exam Soft palate, tonsils and post pharynx WNL via oropharyngeal exam Neck-symmetric, no masses; thyroid nonpalpable and nontender Pulmonary-no tachypnea or accessory muscle use; Clear without wheezes via auscultation Card--no abnrml murmurs, rhythm reg and rate WNL Carotid pulses symmetric, without bruits        Assessment & Plan:  Impression #1 type 2 diabetes.  Tight control.  Discussed.  Possible low sugar spells discussed.  Patient to stop skipping meals  2.  Hypertension good control on repeat.  Patient maintain same meds  3.  Hyperlipidemia prior blood work reviewed to maintain same  4.  Chronic depression.  Ongoing.  Patient states suboptimal.  Will increase Lexapro.  Rationale discussed.  Also exercise encouraged.  Follow-up as scheduled

## 2018-08-29 ENCOUNTER — Other Ambulatory Visit (HOSPITAL_COMMUNITY): Payer: Self-pay | Admitting: Adult Health

## 2018-08-29 ENCOUNTER — Other Ambulatory Visit (HOSPITAL_COMMUNITY): Payer: Self-pay | Admitting: Family Medicine

## 2018-08-29 ENCOUNTER — Telehealth: Payer: Self-pay | Admitting: Adult Health

## 2018-08-29 DIAGNOSIS — R928 Other abnormal and inconclusive findings on diagnostic imaging of breast: Secondary | ICD-10-CM

## 2018-08-29 DIAGNOSIS — Z1231 Encounter for screening mammogram for malignant neoplasm of breast: Secondary | ICD-10-CM

## 2018-08-29 NOTE — Telephone Encounter (Signed)
Pt needs order for diagnostic breast test at [facility] per last breast ultrasound.

## 2018-08-29 NOTE — Telephone Encounter (Signed)
Patient called stating that she  would need a referral for a diagnostic test from [facility] at the [facility]. Please contact pt

## 2018-08-29 NOTE — Telephone Encounter (Signed)
Left message that Diagnostic bilateral mammogram and left breast US at [facility] 1/21 at 2:40, if this not good you can call and reschedule now that order is in.

## 2018-08-30 ENCOUNTER — Ambulatory Visit: Payer: BLUE CROSS/BLUE SHIELD | Admitting: Family Medicine

## 2018-09-08 ENCOUNTER — Other Ambulatory Visit: Payer: Self-pay | Admitting: Family Medicine

## 2018-09-10 NOTE — Telephone Encounter (Signed)
Script printed; awaiting signature. Left message to return call

## 2018-09-10 NOTE — Telephone Encounter (Signed)
Pt checking on status of refill.

## 2018-09-10 NOTE — Telephone Encounter (Signed)
6 mo worth  

## 2018-09-11 ENCOUNTER — Ambulatory Visit (HOSPITAL_COMMUNITY)
Admission: RE | Admit: 2018-09-11 | Discharge: 2018-09-11 | Disposition: A | Discharge status: Home / Self Care | Payer: PRIVATE HEALTH INSURANCE | Source: Ambulatory Visit | Attending: Adult Health | Admitting: Adult Health

## 2018-09-11 DIAGNOSIS — R928 Other abnormal and inconclusive findings on diagnostic imaging of breast: Secondary | ICD-10-CM

## 2018-09-11 LAB — BASIC METABOLIC PANEL
BUN/Creatinine Ratio: 14 (ref 9–23)
BUN: 13 mg/dL (ref 6–24)
CO2: 23 mmol/L (ref 20–29)
Calcium: 9.8 mg/dL (ref 8.7–10.2)
Chloride: 100 mmol/L (ref 96–106)
Creatinine, Ser: 0.94 mg/dL (ref 0.57–1.00)
GFR calc Af Amer: 78 mL/min/{1.73_m2} (ref >59)
GFR calc non Af Amer: 68 mL/min/{1.73_m2} (ref >59)
Glucose: 163 mg/dL — ABNORMAL HIGH (ref 65–99)
Potassium: 4.3 mmol/L (ref 3.5–5.2)
Sodium: 143 mmol/L (ref 134–144)

## 2018-09-11 LAB — MICROALBUMIN / CREATININE URINE RATIO
Creatinine, Urine: 167 mg/dL
Microalb/Creat Ratio: 7 mg/g creat (ref 0–29)
Microalbumin, Urine: 11.5 ug/mL

## 2018-09-11 LAB — CBC WITH DIFFERENTIAL/PLATELET
Basophils Absolute: 0 10*3/uL (ref 0.0–0.2)
Basos: 1 %
EOS (ABSOLUTE): 0.1 10*3/uL (ref 0.0–0.4)
Eos: 1 %
Hematocrit: 37.4 % (ref 34.0–46.6)
Hemoglobin: 13.1 g/dL (ref 11.1–15.9)
Immature Grans (Abs): 0 10*3/uL (ref 0.0–0.1)
Immature Granulocytes: 0 %
Lymphocytes Absolute: 1.9 10*3/uL (ref 0.7–3.1)
Lymphs: 32 %
MCH: 31.9 pg (ref 26.6–33.0)
MCHC: 35 g/dL (ref 31.5–35.7)
MCV: 91 fL (ref 79–97)
Monocytes Absolute: 0.4 10*3/uL (ref 0.1–0.9)
Monocytes: 7 %
Neutrophils Absolute: 3.4 10*3/uL (ref 1.4–7.0)
Neutrophils: 59 %
Platelets: 353 10*3/uL (ref 150–450)
RBC: 4.11 x10E6/uL (ref 3.77–5.28)
RDW: 11.7 % (ref 11.7–15.4)
WBC: 5.8 10*3/uL (ref 3.4–10.8)

## 2018-09-11 LAB — LIPID PANEL
Chol/HDL Ratio: 4.1 ratio (ref 0.0–4.4)
Cholesterol, Total: 221 mg/dL — ABNORMAL HIGH (ref 100–199)
HDL: 54 mg/dL (ref >39)
LDL Calculated: 137 mg/dL — ABNORMAL HIGH (ref 0–99)
Triglycerides: 150 mg/dL — ABNORMAL HIGH (ref 0–149)
VLDL Cholesterol Cal: 30 mg/dL (ref 5–40)

## 2018-09-11 LAB — HEPATIC FUNCTION PANEL
ALT: 27 IU/L (ref 0–32)
AST: 27 IU/L (ref 0–40)
Albumin: 4.7 g/dL (ref 3.8–4.9)
Alkaline Phosphatase: 102 IU/L (ref 39–117)
Bilirubin Total: 0.6 mg/dL (ref 0.0–1.2)
Bilirubin, Direct: 0.16 mg/dL (ref 0.00–0.40)
Total Protein: 7.7 g/dL (ref 6.0–8.5)

## 2018-09-18 ENCOUNTER — Ambulatory Visit: Payer: BLUE CROSS/BLUE SHIELD | Admitting: Adult Health

## 2018-10-03 ENCOUNTER — Other Ambulatory Visit: Payer: Self-pay | Admitting: Family Medicine

## 2018-11-09 ENCOUNTER — Other Ambulatory Visit: Payer: Self-pay | Admitting: Family Medicine

## 2018-11-15 ENCOUNTER — Telehealth: Payer: Self-pay | Admitting: Family Medicine

## 2018-11-15 NOTE — Telephone Encounter (Signed)
Please advise. Thank you

## 2018-11-15 NOTE — Telephone Encounter (Signed)
Pt states her follow up appointment was for 11/20/2018 (she cancelled but now feels she needs to be seen)  Pt states FBS has been running 159-200 at home, states unable to exercise due to [facility] being closed  Last labs showed elevated cholesterol & glucose  Please advise, ? Repeat labs & phone or video visit

## 2018-11-15 NOTE — Telephone Encounter (Signed)
Ok re insert make it tele  May be via telephone oor video depending on capabilites at that time

## 2018-11-15 NOTE — Telephone Encounter (Signed)
Pt contacted and verbalized understanding. Pt states she is willing to do a phone visit on 11/20/2018.

## 2018-11-20 ENCOUNTER — Other Ambulatory Visit: Payer: Self-pay

## 2018-11-20 ENCOUNTER — Ambulatory Visit: Payer: BLUE CROSS/BLUE SHIELD | Admitting: Family Medicine

## 2018-11-20 ENCOUNTER — Encounter: Payer: Self-pay | Admitting: Family Medicine

## 2018-11-20 ENCOUNTER — Ambulatory Visit (INDEPENDENT_AMBULATORY_CARE_PROVIDER_SITE_OTHER): Payer: PRIVATE HEALTH INSURANCE | Admitting: Family Medicine

## 2018-11-20 ENCOUNTER — Ambulatory Visit: Payer: Medicaid Other | Admitting: Family Medicine

## 2018-11-20 VITALS — Ht 66.0 in | Wt 180.0 lb

## 2018-11-20 DIAGNOSIS — F329 Major depressive disorder, single episode, unspecified: Secondary | ICD-10-CM | POA: Diagnosis not present

## 2018-11-20 DIAGNOSIS — I10 Essential (primary) hypertension: Secondary | ICD-10-CM

## 2018-11-20 DIAGNOSIS — F419 Anxiety disorder, unspecified: Secondary | ICD-10-CM | POA: Diagnosis not present

## 2018-11-20 DIAGNOSIS — F32A Depression, unspecified: Secondary | ICD-10-CM

## 2018-11-20 DIAGNOSIS — E1165 Type 2 diabetes mellitus with hyperglycemia: Secondary | ICD-10-CM | POA: Diagnosis not present

## 2018-11-20 MED ORDER — LOSARTAN POTASSIUM 50 MG PO TABS: 50.0000 mg | ORAL_TABLET | Freq: Every day | ORAL | 5 refills | Status: DC

## 2018-11-20 MED ORDER — HYDROCHLOROTHIAZIDE 25 MG PO TABS: ORAL_TABLET | ORAL | 5 refills | Status: DC

## 2018-11-20 MED ORDER — ESCITALOPRAM OXALATE 20 MG PO TABS: 20.0000 mg | ORAL_TABLET | Freq: Every day | ORAL | 5 refills | Status: DC

## 2018-11-20 MED ORDER — GABAPENTIN 300 MG PO CAPS: ORAL_CAPSULE | ORAL | 5 refills | Status: DC

## 2018-11-20 MED ORDER — GLIPIZIDE ER 5 MG PO TB24: ORAL_TABLET | ORAL | 5 refills | Status: DC

## 2018-11-20 MED ORDER — ALPRAZOLAM 0.5 MG PO TABS: ORAL_TABLET | ORAL | 5 refills | Status: DC

## 2018-11-20 MED ORDER — ZOLPIDEM TARTRATE 10 MG PO TABS: ORAL_TABLET | ORAL | 5 refills | Status: DC

## 2018-11-20 MED ORDER — OMEPRAZOLE 40 MG PO CPDR: 40.0000 mg | DELAYED_RELEASE_CAPSULE | Freq: Every day | ORAL | 3 refills | Status: DC

## 2018-11-20 MED ORDER — ATORVASTATIN CALCIUM 10 MG PO TABS: ORAL_TABLET | ORAL | 1 refills | Status: DC

## 2018-11-20 NOTE — Progress Notes (Signed)
   Subjective:    Patient ID: Laura Scott, female    DOB: 10/14/1960, 58 y.o.   MRN: [id number]  Diabetes  She presents for her follow-up diabetic visit. She has type 2 diabetes mellitus. There are no hypoglycemic associated symptoms. Associated symptoms include fatigue. There are no hypoglycemic complications. There are no diabetic complications. Risk factors for coronary artery disease include hypertension. Exercise: unable to get to [place] to [place] at this time. She does not see a podiatrist.Eye exam is not current.  Hypertension  This is a chronic problem. There are no compliance problems.   Depression         This is a chronic problem.  The problem has been gradually worsening since onset.  Associated symptoms include fatigue.  Compliance with treatment is good.  Pt has oxycodone for back pain as needed.   Virtual Visit via Telephone Note  I connected with Laura Scott on 11/20/18 at 10:30 AM EDT by telephone and verified that I am speaking with the correct person using two identifiers.   I discussed the limitations, risks, security and privacy concerns of performing an evaluation and management service by telephone and the availability of in person appointments. I also discussed with the patient that there may be a patient responsible charge related to this service. The patient expressed understanding and agreed to proceed.  Patient claims compliance with diabetes medication. No obvious side effects. Reports no substantial low sugar spells. Most numbers are generally in good range when checked fasting. Generally does not miss a dose of medication. Watching diabetic diet closely  Blood pressure medicine and blood pressure levels reviewed today with patient. Compliant with blood pressure medicine. States does not miss a dose. No obvious side effects. Blood pressure generally good when checked elsewhere. Watching salt intake.  Patient's cholesterol was simply too high on last blood work.   Long discussion held regarding pros and cons of initiating medicine  History of Present Illness:    Observations/Objective:   Assessment and Plan:   Follow Up Instructions:    I discussed the assessment and treatment plan with the patient. The patient was provided an opportunity to ask questions and all were answered. The patient agreed with the plan and demonstrated an understanding of the instructions.   The patient was advised to call back or seek an in-person evaluation if the symptoms worsen or if the condition fails to improve as anticipated.  I provided 25 minutes of non-face-to-face time during this encounter.   Vicente Males, LPN   Review of Systems  Constitutional: Positive for fatigue.  Psychiatric/Behavioral: Positive for depression.       Objective:   Physical Exam  Not examined due to telephone      Assessment & Plan:  Impression 1 type 2 diabetes.  Suboptimal control.  Morning sugars upper 100s.  Increase Glucotrol XL to 2 tablets daily  2.  Hypertension.  Good control discussed maintain same meds rationale discussed  3.  Hyperlipidemia.  Ongoing.  Initiate Lipitor rationale discussed side effects discussed  4.  Depression and anxiety, and insomnia discussed medications maintain  Follow-up in 4 months and as above

## 2018-12-26 DIAGNOSIS — M961 Postlaminectomy syndrome, not elsewhere classified: Secondary | ICD-10-CM | POA: Diagnosis not present

## 2018-12-26 DIAGNOSIS — M5416 Radiculopathy, lumbar region: Secondary | ICD-10-CM | POA: Diagnosis not present

## 2018-12-28 ENCOUNTER — Other Ambulatory Visit: Payer: Self-pay | Admitting: Neurosurgery

## 2018-12-28 DIAGNOSIS — M961 Postlaminectomy syndrome, not elsewhere classified: Secondary | ICD-10-CM

## 2019-01-09 ENCOUNTER — Ambulatory Visit
Admission: RE | Admit: 2019-01-09 | Discharge: 2019-01-09 | Disposition: A | Discharge status: Home / Self Care | Payer: Medicare Other | Source: Ambulatory Visit | Attending: Neurosurgery | Admitting: Neurosurgery

## 2019-01-09 ENCOUNTER — Other Ambulatory Visit: Payer: Self-pay

## 2019-01-09 DIAGNOSIS — M48061 Spinal stenosis, lumbar region without neurogenic claudication: Secondary | ICD-10-CM | POA: Diagnosis not present

## 2019-01-09 DIAGNOSIS — M961 Postlaminectomy syndrome, not elsewhere classified: Secondary | ICD-10-CM

## 2019-01-09 MED ORDER — MICROLET LANCETS MISC: 5 refills | Status: DC

## 2019-01-28 DIAGNOSIS — M5416 Radiculopathy, lumbar region: Secondary | ICD-10-CM | POA: Diagnosis not present

## 2019-01-28 DIAGNOSIS — M4726 Other spondylosis with radiculopathy, lumbar region: Secondary | ICD-10-CM | POA: Diagnosis not present

## 2019-01-28 DIAGNOSIS — Z981 Arthrodesis status: Secondary | ICD-10-CM | POA: Diagnosis not present

## 2019-01-28 DIAGNOSIS — M5117 Intervertebral disc disorders with radiculopathy, lumbosacral region: Secondary | ICD-10-CM | POA: Diagnosis not present

## 2019-01-29 ENCOUNTER — Other Ambulatory Visit: Payer: Self-pay | Admitting: Orthopaedic Surgery

## 2019-02-13 NOTE — Telephone Encounter (Signed)
Patient called in regarding this refill request initially via [employer]; refill for medication (prescribed in February of 2019: ibuprofen (ADVIL,MOTRIN) 800 MG tablet 270 tablet  -[facility] is pharmacy   - please advise regarding refill, or if need to schedule appointment.

## 2019-03-06 ENCOUNTER — Other Ambulatory Visit: Payer: Self-pay | Admitting: Family Medicine

## 2019-03-11 ENCOUNTER — Other Ambulatory Visit: Payer: Self-pay

## 2019-03-11 NOTE — Patient Outreach (Signed)
[place] [facility]) Care Management  03/11/2019  Ritta Hammes 1961/01/17 [id number]   Medication Adherence call to Mrs. Sheppard Penton HIPPA Compliant Voice message left with a call back number. Mrs. Bolanos is showing past due on Losartan 50 mg under [place].  [place] Management Direct Dial [phone]  Fax [phone] [email or web address]

## 2019-03-12 DIAGNOSIS — G5723 Lesion of femoral nerve, bilateral lower limbs: Secondary | ICD-10-CM | POA: Diagnosis not present

## 2019-03-12 DIAGNOSIS — M5416 Radiculopathy, lumbar region: Secondary | ICD-10-CM | POA: Diagnosis not present

## 2019-03-12 DIAGNOSIS — Z981 Arthrodesis status: Secondary | ICD-10-CM | POA: Diagnosis not present

## 2019-03-19 DIAGNOSIS — M5416 Radiculopathy, lumbar region: Secondary | ICD-10-CM | POA: Diagnosis not present

## 2019-03-19 DIAGNOSIS — Z981 Arthrodesis status: Secondary | ICD-10-CM | POA: Diagnosis not present

## 2019-03-19 DIAGNOSIS — G5711 Meralgia paresthetica, right lower limb: Secondary | ICD-10-CM | POA: Diagnosis not present

## 2019-03-27 DIAGNOSIS — M5416 Radiculopathy, lumbar region: Secondary | ICD-10-CM | POA: Diagnosis not present

## 2019-03-27 DIAGNOSIS — M4807 Spinal stenosis, lumbosacral region: Secondary | ICD-10-CM | POA: Diagnosis not present

## 2019-03-27 DIAGNOSIS — M48061 Spinal stenosis, lumbar region without neurogenic claudication: Secondary | ICD-10-CM | POA: Diagnosis not present

## 2019-03-27 DIAGNOSIS — M4726 Other spondylosis with radiculopathy, lumbar region: Secondary | ICD-10-CM | POA: Diagnosis not present

## 2019-03-27 DIAGNOSIS — M47816 Spondylosis without myelopathy or radiculopathy, lumbar region: Secondary | ICD-10-CM | POA: Diagnosis not present

## 2019-03-27 DIAGNOSIS — M5116 Intervertebral disc disorders with radiculopathy, lumbar region: Secondary | ICD-10-CM | POA: Diagnosis not present

## 2019-03-29 ENCOUNTER — Other Ambulatory Visit: Payer: Self-pay | Admitting: Family Medicine

## 2019-04-01 ENCOUNTER — Ambulatory Visit (INDEPENDENT_AMBULATORY_CARE_PROVIDER_SITE_OTHER): Payer: Medicare Other | Admitting: Family Medicine

## 2019-04-01 ENCOUNTER — Other Ambulatory Visit: Payer: Self-pay

## 2019-04-01 DIAGNOSIS — E785 Hyperlipidemia, unspecified: Secondary | ICD-10-CM

## 2019-04-01 DIAGNOSIS — Z79899 Other long term (current) drug therapy: Secondary | ICD-10-CM | POA: Diagnosis not present

## 2019-04-01 DIAGNOSIS — Z20822 Contact with and (suspected) exposure to covid-19: Secondary | ICD-10-CM

## 2019-04-01 DIAGNOSIS — E1165 Type 2 diabetes mellitus with hyperglycemia: Secondary | ICD-10-CM

## 2019-04-01 DIAGNOSIS — M47816 Spondylosis without myelopathy or radiculopathy, lumbar region: Secondary | ICD-10-CM | POA: Diagnosis not present

## 2019-04-01 DIAGNOSIS — F32A Depression, unspecified: Secondary | ICD-10-CM

## 2019-04-01 DIAGNOSIS — F329 Major depressive disorder, single episode, unspecified: Secondary | ICD-10-CM

## 2019-04-01 DIAGNOSIS — F419 Anxiety disorder, unspecified: Secondary | ICD-10-CM

## 2019-04-01 MED ORDER — FLUTICASONE PROPIONATE 50 MCG/ACT NA SUSP: 2.0000 | Freq: Every day | NASAL | 5 refills | Status: AC

## 2019-04-01 MED ORDER — ALPRAZOLAM 0.5 MG PO TABS: ORAL_TABLET | ORAL | 5 refills | Status: DC

## 2019-04-01 MED ORDER — ATORVASTATIN CALCIUM 10 MG PO TABS: ORAL_TABLET | ORAL | 1 refills | Status: DC

## 2019-04-01 MED ORDER — OMEPRAZOLE 40 MG PO CPDR: 40.0000 mg | DELAYED_RELEASE_CAPSULE | Freq: Every day | ORAL | 1 refills | Status: DC

## 2019-04-01 MED ORDER — LOSARTAN POTASSIUM 50 MG PO TABS: 50.0000 mg | ORAL_TABLET | Freq: Every day | ORAL | 5 refills | Status: DC

## 2019-04-01 MED ORDER — ZOLPIDEM TARTRATE 10 MG PO TABS: ORAL_TABLET | ORAL | 5 refills | Status: DC

## 2019-04-01 MED ORDER — ESCITALOPRAM OXALATE 20 MG PO TABS: 20.0000 mg | ORAL_TABLET | Freq: Every day | ORAL | 5 refills | Status: DC

## 2019-04-01 MED ORDER — HYDROCHLOROTHIAZIDE 25 MG PO TABS: ORAL_TABLET | ORAL | 5 refills | Status: DC

## 2019-04-01 MED ORDER — GLIPIZIDE ER 5 MG PO TB24: ORAL_TABLET | ORAL | 5 refills | Status: DC

## 2019-04-01 MED ORDER — GABAPENTIN 300 MG PO CAPS: ORAL_CAPSULE | ORAL | 5 refills | Status: DC

## 2019-04-01 NOTE — Progress Notes (Signed)
   Subjective:  Audio plus video  Patient ID: Laura Scott, female    DOB: 04-21-1961, 58 y.o.   MRN: [id number]  Diabetes She presents for her follow-up diabetic visit. She has type 2 diabetes mellitus. Current diabetic treatments: glipizide 5mg  2qam. She is compliant with treatment all of the time. Home blood sugar record trend: sugar normally around 140 but has been up the past couple of days. today over 300 after eating a buscuit. Eye exam is not current.  having some dizziness. Started today.   Virtual Visit via Telephone Note  I connected with Laura Scott on 04/01/19 at  2:00 PM EDT by telephone and verified that I am speaking with the correct person using two identifiers.  Location: Patient: home Provider: office  I discussed the limitations, risks, security and privacy concerns of performing an evaluation and management service by telephone and the availability of in person appointments. I also discussed with the patient that there may be a patient responsible charge related to this service. The patient expressed understanding and agreed to proceed.   History of Present Illness:    Observations/Objective:   Assessment and Plan:   Follow Up Instructions:    I discussed the assessment and treatment plan with the patient. The patient was provided an opportunity to ask questions and all were answered. The patient agreed with the plan and demonstrated an understanding of the instructions.   The patient was advised to call back or seek an in-person evaluation if the symptoms worsen or if the condition fails to improve as anticipated.  I provided 28 minutes of non-face-to-face time during this encounter.   Patient compliant with insomnia medication. Generally takes most nights. No obvious morning drowsiness. Definitely helps patient sleep. Without it patient states would not get a good nights rest.  Patient continues to take lipid medication regularly. No obvious side  effects from it. Generally does not miss a dose. Prior blood work results are reviewed with patient. Patient continues to work on fat intake in diet  Blood pressure medicine and blood pressure levels reviewed today with patient. Compliant with blood pressure medicine. States does not miss a dose. No obvious side effects. Blood pressure generally good when checked elsewhere. Watching salt intake.   Patient notes ongoing compliance with antidepressant medication. No obvious side effects. Reports does not miss a dose. Overall continues to help depression substantially. No thoughts of homicide or suicide. Would like to maintain medication. Also has history of anxiety.  States overall decent control at this time   Review of Systems No headache, no major weight loss or weight gain, no chest pain no back pain abdominal pain no change in bowel habits complete ROS otherwise negative     Objective:   Physical Exam   Virtual     Assessment & Plan:  Impression type 2 diabetes.  Fasting sugars good.  Await A1c continue same treatment  2.  Depression with anxiety.  Ongoing.  Benefits from meds meds to maintain  3.  Hypertension.  Last blood pressure today was good.  Patient maintain same  4.  Hyperlipidemia status uncertain check appropriate blood work diet discussed  5.  Insomnia.  Ongoing team therapy  Follow-up in 6 months further recommendations based on blood work diet exercise discussed medications refilled

## 2019-04-02 LAB — NOVEL CORONAVIRUS, NAA: SARS-CoV-2, NAA: NOT DETECTED

## 2019-04-06 ENCOUNTER — Encounter: Payer: Self-pay | Admitting: Family Medicine

## 2019-04-11 DIAGNOSIS — M47816 Spondylosis without myelopathy or radiculopathy, lumbar region: Secondary | ICD-10-CM | POA: Diagnosis not present

## 2019-04-11 DIAGNOSIS — E119 Type 2 diabetes mellitus without complications: Secondary | ICD-10-CM | POA: Diagnosis not present

## 2019-04-16 DIAGNOSIS — Z79899 Other long term (current) drug therapy: Secondary | ICD-10-CM | POA: Diagnosis not present

## 2019-04-16 DIAGNOSIS — M961 Postlaminectomy syndrome, not elsewhere classified: Secondary | ICD-10-CM | POA: Diagnosis not present

## 2019-04-16 DIAGNOSIS — Z5181 Encounter for therapeutic drug level monitoring: Secondary | ICD-10-CM | POA: Diagnosis not present

## 2019-04-16 DIAGNOSIS — M4310 Spondylolisthesis, site unspecified: Secondary | ICD-10-CM | POA: Diagnosis not present

## 2019-04-16 DIAGNOSIS — Z79891 Long term (current) use of opiate analgesic: Secondary | ICD-10-CM | POA: Diagnosis not present

## 2019-04-16 DIAGNOSIS — I10 Essential (primary) hypertension: Secondary | ICD-10-CM | POA: Diagnosis not present

## 2019-04-22 DIAGNOSIS — M47816 Spondylosis without myelopathy or radiculopathy, lumbar region: Secondary | ICD-10-CM | POA: Diagnosis not present

## 2019-04-24 ENCOUNTER — Other Ambulatory Visit: Payer: Self-pay | Admitting: Family Medicine

## 2019-05-02 DIAGNOSIS — M47816 Spondylosis without myelopathy or radiculopathy, lumbar region: Secondary | ICD-10-CM | POA: Diagnosis not present

## 2019-05-04 ENCOUNTER — Other Ambulatory Visit: Payer: Self-pay | Admitting: Family Medicine

## 2019-05-06 NOTE — Telephone Encounter (Signed)
Ok plus 5 monthly ref 

## 2019-05-29 ENCOUNTER — Other Ambulatory Visit: Payer: Self-pay | Admitting: *Deleted

## 2019-05-29 DIAGNOSIS — Z20822 Contact with and (suspected) exposure to covid-19: Secondary | ICD-10-CM

## 2019-05-31 LAB — NOVEL CORONAVIRUS, NAA: SARS-CoV-2, NAA: NOT DETECTED

## 2019-06-10 DIAGNOSIS — Z9889 Other specified postprocedural states: Secondary | ICD-10-CM | POA: Diagnosis not present

## 2019-06-10 DIAGNOSIS — G894 Chronic pain syndrome: Secondary | ICD-10-CM | POA: Diagnosis not present

## 2019-06-10 DIAGNOSIS — Z885 Allergy status to narcotic agent status: Secondary | ICD-10-CM | POA: Diagnosis not present

## 2019-06-10 DIAGNOSIS — M47816 Spondylosis without myelopathy or radiculopathy, lumbar region: Secondary | ICD-10-CM | POA: Diagnosis not present

## 2019-06-10 DIAGNOSIS — Z880 Allergy status to penicillin: Secondary | ICD-10-CM | POA: Diagnosis not present

## 2019-06-10 DIAGNOSIS — M5416 Radiculopathy, lumbar region: Secondary | ICD-10-CM | POA: Diagnosis not present

## 2019-06-10 DIAGNOSIS — Z882 Allergy status to sulfonamides status: Secondary | ICD-10-CM | POA: Diagnosis not present

## 2019-07-11 ENCOUNTER — Other Ambulatory Visit: Payer: Self-pay

## 2019-07-11 NOTE — Patient Outreach (Signed)
[facility]) Care Management  07/11/2019  Johneisha Phon [birth year] [id number]   Medication Adherence call to Mrs. Sheppard Penton  [place] Identifiers Verify spoke with patient she is past due on Atorvastatin 10 mg,patient explain she is taking 1 tablet daily,patient explain she did not started taking it when prescribed,patient started few days  later,patient explain she has enough for 15 more days and will call pharmacy,patient was a bit worry because she did not have any more refills she ask to see if pharmacy had any left,pharmacy has a prescription on hold that was send in in August/2020,patient will call pharmacy when she finished with the 15  Days.Mrs. Geitz is showing past due under [place].   [place] Management Direct Dial [phone]  Fax [phone] [email or web address]

## 2019-08-10 ENCOUNTER — Other Ambulatory Visit: Payer: Self-pay

## 2019-08-10 ENCOUNTER — Ambulatory Visit
Admission: EM | Admit: 2019-08-10 | Discharge: 2019-08-10 | Disposition: A | Discharge status: Home / Self Care | Payer: Medicare Other | Attending: Emergency Medicine | Admitting: Emergency Medicine

## 2019-08-10 DIAGNOSIS — Z20828 Contact with and (suspected) exposure to other viral communicable diseases: Secondary | ICD-10-CM

## 2019-08-10 DIAGNOSIS — Z20822 Contact with and (suspected) exposure to covid-19: Secondary | ICD-10-CM

## 2019-08-10 DIAGNOSIS — R05 Cough: Secondary | ICD-10-CM

## 2019-08-10 MED ORDER — ALBUTEROL SULFATE HFA 108 (90 BASE) MCG/ACT IN AERS: 1.0000 | INHALATION_SPRAY | Freq: Four times a day (QID) | RESPIRATORY_TRACT | 0 refills | Status: DC | PRN

## 2019-08-10 MED ORDER — BENZONATATE 100 MG PO CAPS: 100.0000 mg | ORAL_CAPSULE | Freq: Three times a day (TID) | ORAL | 0 refills | Status: DC

## 2019-08-10 NOTE — Discharge Instructions (Addendum)
COVID testing ordered.  It will take between 2-7 days for test results.  Someone will contact you regarding abnormal results.    In the meantime: You should remain isolated in your home for 10 days from symptom onset Get plenty of rest and push fluids Tessalon Perles prescribed for cough Use medications daily for symptom relief Use OTC medications like ibuprofen or tylenol as needed fever or pain Call or go to the ED if you have any new or worsening symptoms such as fever, worsening cough, shortness of breath, chest tightness, chest pain, turning blue, changes in mental status, etc..Marland Kitchen

## 2019-08-10 NOTE — ED Triage Notes (Signed)
Pt has had positve covid exposure and developed cough 2 days ago

## 2019-08-10 NOTE — ED Provider Notes (Addendum)
[facility]    CSN: [id number] Arrival date & time: 08/10/19  1218      History   Chief Complaint Chief Complaint  Patient presents with  . Cough    HPI Laura Scott is a 58 y.o. female.   Laura Scott 58 years old female presented to the urgent care with a complaint of cough for the past 2 days.  She states she was exposed to Covid positive person 7 days ago.  Denies sick exposure to  flu or strep.  Denies recent travel.  Denies aggravating or alleviating symptoms.  Denies previous COVID infection.   Denies fever, chills, fatigue, nasal congestion, rhinorrhea, sore throat, SOB, wheezing, chest pain, nausea, vomiting, changes in bowel or bladder habits.    The history is provided by the patient. No language interpreter was used.  Cough   Past Medical History:  Diagnosis Date  . Allergic rhinitis   . Anxiety   . Chronic pain   . Depression   . Diabetes mellitus without complication ([place])   . GERD (gastroesophageal reflux disease)   . Headache   . Hyperlipidemia   . Hypertension   . IBS (irritable bowel syndrome)   . PMS (premenstrual syndrome)   . Reflux     Patient Active Problem List   Diagnosis Date Noted  . Type 2 diabetes mellitus with hyperglycemia ([place]) 01/08/2018  . Anxiety and depression 05/17/2017  . Prediabetes 10/26/2016  . LUQ pain 09/28/2016  . Rectal bleeding 09/28/2016  . Constipation 09/28/2016  . FHx: colonic polyps 09/28/2016  . Dysphagia 09/28/2016  . Anxiety 05/11/2015  . Clinical depression 05/11/2015  . Essential hypertension 05/11/2015  . Reflux 05/11/2015  . Lumbar spondylosis 03/26/2014  . Impaired fasting glucose 10/29/2013  . Arthritis 10/29/2013  . Chronic back pain 04/11/2013  . Insomnia 04/11/2013    Past Surgical History:  Procedure Laterality Date  . COLONOSCOPY  08/29/2011   [name]: normal. high risk screening tcs in 5 years.   . COLONOSCOPY WITH PROPOFOL N/A 10/20/2016   Procedure: COLONOSCOPY WITH  PROPOFOL;  Surgeon: Daneil Dolin, MD;  [facility];  Service: Endoscopy;  Laterality: N/A;  10:45am  . ESOPHAGOGASTRODUODENOSCOPY (EGD) WITH PROPOFOL N/A 10/20/2016   Procedure: ESOPHAGOGASTRODUODENOSCOPY (EGD) WITH PROPOFOL;  Surgeon: Daneil Dolin, MD;  [facility];  Service: Endoscopy;  Laterality: N/A;  . LUMBAR [place]    . MALONEY DILATION N/A 10/20/2016   Procedure: Venia Minks DILATION;  Surgeon: Daneil Dolin, MD;  [facility];  Service: Endoscopy;  Laterality: N/A;  . POLYPECTOMY  10/20/2016   Procedure: POLYPECTOMY;  Surgeon: Daneil Dolin, MD;  [facility];  Service: Endoscopy;;  cecal and sigmoid polypectomies    OB History   No obstetric history on file.      Home Medications    Prior to Admission medications   Medication Sig Start Date End Date Taking? Authorizing Provider  ACCU-CHEK GUIDE test strip USE TO TEST ONCE DAILY. 04/24/19   Mikey Kirschner, MD  ALPRAZolam [name]) 0.5 MG tablet TAKE (1) TABLET BY MOUTH EVERY SIX HOURS AS NEEDED. MAY FILL MONTHLY PER MD. 05/06/19   Mikey Kirschner, MD  atorvastatin (LIPITOR) 10 MG tablet TAKE 1 TABLET AT BEDTIME. 04/01/19   Mikey Kirschner, MD  benzonatate (TESSALON) 100 MG capsule Take 1 capsule (100 mg total) by mouth every 8 (eight) hours. 08/10/19   Avegno, Darrelyn Hillock, FNP  blood glucose meter kit and  supplies KIT Dispense based on patient and insurance preference. Tests sugar once a day. DX E11.9 01/08/18   Mikey Kirschner, MD  Cyanocobalamin (VITAMIN B-12) 5000 MCG SUBL Place 1 tablet under the tongue daily.    [provider]  cyclobenzaprine (FLEXERIL) 10 MG tablet  01/05/17   [provider]  escitalopram (LEXAPRO) 20 MG tablet Take 1 tablet (20 mg total) by mouth daily. 04/01/19   Mikey Kirschner, MD  fluticasone (FLONASE) 50 MCG/ACT nasal spray Place 2 sprays into both nostrils daily. 04/01/19   Mikey Kirschner, MD  gabapentin (NEURONTIN) 300 MG  capsule TAKE 1 CAPSULE BY MOUTH THREE TIMES A DAY. 04/01/19   Mikey Kirschner, MD  glipiZIDE (GLIPIZIDE XL) 5 MG 24 hr tablet Take two tablet by mouth each morning 04/01/19   Mikey Kirschner, MD  hydrochlorothiazide (HYDRODIURIL) 25 MG tablet TAKE (1) TABLET BY MOUTH ONCE DAILY AS NEEDED. 04/01/19   Mikey Kirschner, MD  ibuprofen (IBU) 800 MG tablet TAKE (1) TABLET BY MOUTH EVERY EIGHT HOURS AS NEEDED. 02/14/19   Sanjuana Kava, MD  lidocaine (LIDODERM) 5 % Place 1 patch onto the skin daily as needed. Remove & Discard patch within 12 hours or as directed by MD    [provider]  loratadine (CLARITIN) 10 MG tablet Take 10 mg by mouth daily.    [provider]  losartan (COZAAR) 50 MG tablet Take 1 tablet (50 mg total) by mouth daily. 04/01/19   Mikey Kirschner, MD  Microlet Lancets MISC USE TO TEST BLOOD SUGAR ONCE DAILY. 01/09/19   Mikey Kirschner, MD  naloxegol oxalate (MOVANTIK) 12.5 MG TABS tablet Take 12.5 mg by mouth daily as needed. Patient states she takes as needed but not daily due to cost     [provider]  omeprazole (PRILOSEC) 40 MG capsule Take 1 capsule (40 mg total) by mouth daily. 04/01/19   Mikey Kirschner, MD  oxyCODONE-acetaminophen (PERCOCET/ROXICET) 5-325 MG tablet Take 1 tablet by mouth every 8 (eight) hours as needed for severe pain.     [provider]  Pyridoxine HCl (VITAMIN B-6) 250 MG tablet Take 250 mg by mouth daily.    [provider]  zolpidem (AMBIEN) 10 MG tablet TAKE (1) TABLET BY MOUTH AT BEDTIME AS NEEDED. 04/01/19   Mikey Kirschner, MD    Family History Family History  Problem Relation Age of Onset  . Hypertension Father   . Diabetes Father   . Hypertension Mother   . Colon polyps Mother   . Hypertension Sister   . Hyperlipidemia Sister   . Colon polyps Brother     Social History Social History   Tobacco Use  . Smoking status: Never Smoker  . Smokeless tobacco: Never Used  Substance Use Topics   . Alcohol use: Yes    Comment: occ  . Drug use: No     Allergies   Codeine, Sulfa antibiotics, Other, Metformin and related, and Penicillins   Review of Systems Review of Systems  Constitutional: Negative.   HENT: Negative.   Respiratory: Positive for cough.   Cardiovascular: Negative.   Gastrointestinal: Negative.   Neurological: Negative.   ROS: All other are neghatives   Physical Exam Triage Vital Signs ED Triage Vitals  Enc Vitals Group     BP 08/10/19 1242 (!) 150/72     Pulse Rate 08/10/19 1242 78     Resp 08/10/19 1242 18     Temp 08/10/19  1242 99 F (37.2 C)     Temp src --      SpO2 08/10/19 1242 95 %     Weight --      Height --      Head Circumference --      Peak Flow --      Pain Score 08/10/19 1240 0     Pain Loc --      Pain Edu? --      Excl. in [place]? --    No data found.  Updated Vital Signs BP (!) 150/72   Pulse 78   Temp 99 F (37.2 C)   Resp 18   LMP 09/03/2013   SpO2 95%   Visual Acuity Right Eye Distance:   Left Eye Distance:   Bilateral Distance:    Right Eye Near:   Left Eye Near:    Bilateral Near:     Physical Exam Constitutional:      General: She is not in acute distress.    Appearance: Normal appearance. She is normal weight. She is not ill-appearing or toxic-appearing.  HENT:     Head: Normocephalic.     Right Ear: Tympanic membrane, ear canal and external ear normal. There is no impacted cerumen.     Left Ear: Ear canal and external ear normal. There is no impacted cerumen.     Nose: Nose normal. No congestion.     Mouth/Throat:     Mouth: Mucous membranes are moist.     Pharynx: No oropharyngeal exudate or posterior oropharyngeal erythema.  Cardiovascular:     Rate and Rhythm: Normal rate and regular rhythm.     Pulses: Normal pulses.     Heart sounds: Normal heart sounds. No murmur.  Pulmonary:     Effort: Pulmonary effort is normal. No respiratory distress.     Breath sounds: No wheezing or rhonchi.    Chest:     Chest wall: No tenderness.  Abdominal:     General: Abdomen is flat. Bowel sounds are normal. There is no distension.     Palpations: There is no mass.  Skin:    Capillary Refill: Capillary refill takes less than 2 seconds.  Neurological:     Mental Status: She is alert and oriented to person, place, and time.      UC Treatments / Results  Labs (all labs ordered are listed, but only abnormal results are displayed) Labs Reviewed  NOVEL CORONAVIRUS, NAA    EKG   Radiology No results found.  Procedures Procedures (including critical care time)  Medications Ordered in UC Medications - No data to display  Initial Impression / Assessment and Plan / UC Course  I have reviewed the triage vital signs and the nursing notes.  Pertinent labs & imaging results that were available during my care of the patient were reviewed by me and considered in my medical decision making (see chart for details).   Patient stable for discharge.  COVID-19 test was ordered.  Patient was advised to quarantine until results become available.  Work note given.  Tessalon Perles was prescribed to manage cough.  Final Clinical Impressions(s) / UC Diagnoses   Final diagnoses:  Suspected COVID-19 virus infection     Discharge Instructions     COVID testing ordered.  It will take between 2-7 days for test results.  Someone will contact you regarding abnormal results.    In the meantime: You should remain isolated in your home for 10 days from symptom  onset Get plenty of rest and push fluids Tessalon Perles prescribed for cough Use medications daily for symptom relief Use OTC medications like ibuprofen or tylenol as needed fever or pain Call or go to the ED if you have any new or worsening symptoms such as fever, worsening cough, shortness of breath, chest tightness, chest pain, turning blue, changes in mental status, etc...     ED Prescriptions    Medication Sig Dispense Auth.  Provider   benzonatate (TESSALON) 100 MG capsule Take 1 capsule (100 mg total) by mouth every 8 (eight) hours. 21 capsule Avegno, Darrelyn Hillock, FNP     PDMP not reviewed this encounter.   Emerson Monte, FNP 08/10/19 1341    Emerson Monte, FNP 08/10/19 1354

## 2019-08-12 LAB — NOVEL CORONAVIRUS, NAA: SARS-CoV-2, NAA: NOT DETECTED

## 2019-08-20 ENCOUNTER — Telehealth: Payer: Self-pay | Admitting: Nutrition

## 2019-08-20 NOTE — Telephone Encounter (Signed)
TC from her. She wants to see Dr. Dorris Fetch and myself. Notified her to contact her PCP and request referral to see him and myself. We have to have a referral from PCP to be able to schedule appt. She verbalized understanding.

## 2019-08-22 ENCOUNTER — Other Ambulatory Visit: Payer: Self-pay

## 2019-08-22 ENCOUNTER — Ambulatory Visit (INDEPENDENT_AMBULATORY_CARE_PROVIDER_SITE_OTHER): Payer: Medicare Other | Admitting: Family Medicine

## 2019-08-22 DIAGNOSIS — Z20828 Contact with and (suspected) exposure to other viral communicable diseases: Secondary | ICD-10-CM

## 2019-08-22 DIAGNOSIS — Z20822 Contact with and (suspected) exposure to covid-19: Secondary | ICD-10-CM

## 2019-08-22 DIAGNOSIS — E1165 Type 2 diabetes mellitus with hyperglycemia: Secondary | ICD-10-CM

## 2019-08-22 MED ORDER — ALBUTEROL SULFATE HFA 108 (90 BASE) MCG/ACT IN AERS: INHALATION_SPRAY | RESPIRATORY_TRACT | 0 refills | Status: DC

## 2019-08-22 MED ORDER — CEFPROZIL 500 MG PO TABS: ORAL_TABLET | ORAL | 0 refills | Status: DC

## 2019-08-22 NOTE — Progress Notes (Signed)
   Subjective:  Audio only  Patient ID: Laura Scott, female    DOB: Mar 31, 1961, 58 y.o.   MRN: [id number]  Cough This is a new problem. The cough is productive of sputum. Associated symptoms comments: Tightness in chest, covid test negative, started feeling bad last night, fever, cough, wheezing (worse when laying down). Pt went to Urgent Care and was given albuterol inhaler, benzonatate. Pt is concerned with benzonatate reacting with other med since she is diabetic and hypertensive . Treatments tried: tylenol.   Virtual Visit via Telephone Note  I connected with Laura Scott on 08/22/19 at  3:30 PM EST by telephone and verified that I am speaking with the correct person using two identifiers.  Location: Patient: home Provider: office   I discussed the limitations, risks, security and privacy concerns of performing an evaluation and management service by telephone and the availability of in person appointments. I also discussed with the patient that there may be a patient responsible charge related to this service. The patient expressed understanding and agreed to proceed.   History of Present Illness:    Observations/Objective:   Assessment and Plan:   Follow Up Instructions:    I discussed the assessment and treatment plan with the patient. The patient was provided an opportunity to ask questions and all were answered. The patient agreed with the plan and demonstrated an understanding of the instructions.   The patient was advised to call back or seek an in-person evaluation if the symptoms worsen or if the condition fails to improve as anticipated.  I provided 56minutes of non-face-to-face time during this encounter.   Vicente Males, LPN  Cough is productive of gunky discharge.  See nurse's notes above.  Occasional wheezing.  Worsening symptoms in the last several days  Positive risk factors for potential complications  Review of Systems  Respiratory: Positive for  cough.        Objective:   Physical Exam  Virtual      Assessment & Plan:  Impression potential or even probable COVID-19.  Discussed.  Will add antibiotic for bronchitis component symptom care discussed.  Due to severity of symptoms and inability to order COVID-19 testing on New Year's Eve recommend follow-up tomorrow at urgent care rationale discussed

## 2019-08-23 ENCOUNTER — Other Ambulatory Visit: Payer: Self-pay

## 2019-08-23 ENCOUNTER — Ambulatory Visit (INDEPENDENT_AMBULATORY_CARE_PROVIDER_SITE_OTHER): Payer: Medicare Other

## 2019-08-23 ENCOUNTER — Ambulatory Visit
Admission: EM | Admit: 2019-08-23 | Discharge: 2019-08-23 | Disposition: A | Discharge status: Home / Self Care | Payer: Medicare Other | Attending: Emergency Medicine | Admitting: Emergency Medicine

## 2019-08-23 DIAGNOSIS — Z20822 Contact with and (suspected) exposure to covid-19: Secondary | ICD-10-CM

## 2019-08-23 DIAGNOSIS — R05 Cough: Secondary | ICD-10-CM

## 2019-08-23 DIAGNOSIS — Z7689 Persons encountering health services in other specified circumstances: Secondary | ICD-10-CM | POA: Diagnosis not present

## 2019-08-23 DIAGNOSIS — R059 Cough, unspecified: Secondary | ICD-10-CM

## 2019-08-23 NOTE — ED Triage Notes (Addendum)
Pt presents to UC w/ c/o worsening cough, wheezing x3 days chest tightness and rib pain d/t coughing. Pt states coughing is worse when lying down. Pt states she was prescribed tessalon but has increased her coughing. Pt was sent here by PCP for chest xray.

## 2019-08-23 NOTE — Discharge Instructions (Addendum)
chest x-ray is negative COVID testing ordered.  It will take between 2-7 days for test results.  Someone will contact you regarding abnormal results.    In the meantime: You should remain isolated in your home for 10 days from symptom onset  Get plenty of rest and push fluids Continue to use Tessalon Perles prescribed for cough Use medications daily for symptom relief Use OTC medications like ibuprofen or tylenol as needed fever or pain Call or go to the ED if you have any new or worsening symptoms such as fever, worsening cough, shortness of breath, chest tightness, chest pain, turning blue, changes in mental status, etc..Marland Kitchen

## 2019-08-23 NOTE — ED Provider Notes (Signed)
[facility]    CSN: [id number] Arrival date & time: 08/23/19  6203      History   Chief Complaint Chief Complaint  Patient presents with  . URI sx, covid test    HPI Laura Scott is a 59 y.o. female.   [name] Desire p(resented to the urgent care with a complaint of cough for the past 2 weeks, wheezing, chest tightness and rib cage pain for the past 3 days .  Has used tessalon Perles without relief, and has stopped taking it. Denies sick exposure to COVID, flu or strep.  Denies recent travel.  Denies aggravating or alleviating symptoms.  Denies previous COVID infection.   Denies fever, chills, fatigue, nasal congestion, rhinorrhea, sore throat, SOB,  chest pain, nausea, vomiting, changes in bowel or bladder habits.    The history is provided by the patient. No language interpreter was used.    Past Medical History:  Diagnosis Date  . Allergic rhinitis   . Anxiety   . Chronic pain   . Depression   . Diabetes mellitus without complication ([place])   . GERD (gastroesophageal reflux disease)   . Headache   . Hyperlipidemia   . Hypertension   . IBS (irritable bowel syndrome)   . PMS (premenstrual syndrome)   . Reflux     Patient Active Problem List   Diagnosis Date Noted  . Type 2 diabetes mellitus with hyperglycemia ([place]) 01/08/2018  . Anxiety and depression 05/17/2017  . Prediabetes 10/26/2016  . LUQ pain 09/28/2016  . Rectal bleeding 09/28/2016  . Constipation 09/28/2016  . FHx: colonic polyps 09/28/2016  . Dysphagia 09/28/2016  . Anxiety 05/11/2015  . Clinical depression 05/11/2015  . Essential hypertension 05/11/2015  . Reflux 05/11/2015  . Lumbar spondylosis 03/26/2014  . Impaired fasting glucose 10/29/2013  . Arthritis 10/29/2013  . Chronic back pain 04/11/2013  . Insomnia 04/11/2013    Past Surgical History:  Procedure Laterality Date  . COLONOSCOPY  08/29/2011   [name]: normal. high risk screening tcs in 5 years.   . COLONOSCOPY WITH  PROPOFOL N/A 10/20/2016   Procedure: COLONOSCOPY WITH PROPOFOL;  Surgeon: Daneil Dolin, MD;  [facility];  Service: Endoscopy;  Laterality: N/A;  10:45am  . ESOPHAGOGASTRODUODENOSCOPY (EGD) WITH PROPOFOL N/A 10/20/2016   Procedure: ESOPHAGOGASTRODUODENOSCOPY (EGD) WITH PROPOFOL;  Surgeon: Daneil Dolin, MD;  [facility];  Service: Endoscopy;  Laterality: N/A;  . LUMBAR [place]    . MALONEY DILATION N/A 10/20/2016   Procedure: Venia Minks DILATION;  Surgeon: Daneil Dolin, MD;  [facility];  Service: Endoscopy;  Laterality: N/A;  . POLYPECTOMY  10/20/2016   Procedure: POLYPECTOMY;  Surgeon: Daneil Dolin, MD;  [facility];  Service: Endoscopy;;  cecal and sigmoid polypectomies    OB History   No obstetric history on file.      Home Medications    Prior to Admission medications   Medication Sig Start Date End Date Taking? Authorizing Provider  ACCU-CHEK GUIDE test strip USE TO TEST ONCE DAILY. 04/24/19   Mikey Kirschner, MD  albuterol (VENTOLIN HFA) 108 (90 Base) MCG/ACT inhaler Inhale 1-2 puffs into the lungs every 6 (six) hours as needed for wheezing or shortness of breath. 08/10/19   Alfrieda Tarry, Darrelyn Hillock, FNP  albuterol (VENTOLIN HFA) 108 (90 Base) MCG/ACT inhaler Inhale 2 puffs into lungs QID prn wheezing 08/22/19   Mikey Kirschner, MD  ALPRAZolam [name]) 0.5 MG tablet TAKE (1) TABLET BY  MOUTH EVERY SIX HOURS AS NEEDED. MAY FILL MONTHLY PER MD. 05/06/19   Mikey Kirschner, MD  atorvastatin (LIPITOR) 10 MG tablet TAKE 1 TABLET AT BEDTIME. 04/01/19   Mikey Kirschner, MD  benzonatate (TESSALON) 100 MG capsule Take 1 capsule (100 mg total) by mouth every 8 (eight) hours. 08/10/19   Raesean Bartoletti, Darrelyn Hillock, FNP  blood glucose meter kit and supplies KIT Dispense based on patient and insurance preference. Tests sugar once a day. DX E11.9 01/08/18   Mikey Kirschner, MD  cefPROZIL (CEFZIL) 500 MG tablet Take one tablet po BID for 10 days 08/22/19    Mikey Kirschner, MD  Cyanocobalamin (VITAMIN B-12) 5000 MCG SUBL Place 1 tablet under the tongue daily.    [provider]  cyclobenzaprine (FLEXERIL) 10 MG tablet  01/05/17   [provider]  escitalopram (LEXAPRO) 20 MG tablet Take 1 tablet (20 mg total) by mouth daily. 04/01/19   Mikey Kirschner, MD  fluticasone (FLONASE) 50 MCG/ACT nasal spray Place 2 sprays into both nostrils daily. 04/01/19   Mikey Kirschner, MD  gabapentin (NEURONTIN) 300 MG capsule TAKE 1 CAPSULE BY MOUTH THREE TIMES A DAY. 04/01/19   Mikey Kirschner, MD  glipiZIDE (GLIPIZIDE XL) 5 MG 24 hr tablet Take two tablet by mouth each morning 04/01/19   Mikey Kirschner, MD  hydrochlorothiazide (HYDRODIURIL) 25 MG tablet TAKE (1) TABLET BY MOUTH ONCE DAILY AS NEEDED. 04/01/19   Mikey Kirschner, MD  ibuprofen (IBU) 800 MG tablet TAKE (1) TABLET BY MOUTH EVERY EIGHT HOURS AS NEEDED. 02/14/19   Sanjuana Kava, MD  lidocaine (LIDODERM) 5 % Place 1 patch onto the skin daily as needed. Remove & Discard patch within 12 hours or as directed by MD    [provider]  loratadine (CLARITIN) 10 MG tablet Take 10 mg by mouth daily.    [provider]  losartan (COZAAR) 50 MG tablet Take 1 tablet (50 mg total) by mouth daily. 04/01/19   Mikey Kirschner, MD  Microlet Lancets MISC USE TO TEST BLOOD SUGAR ONCE DAILY. 01/09/19   Mikey Kirschner, MD  naloxegol oxalate (MOVANTIK) 12.5 MG TABS tablet Take 12.5 mg by mouth daily as needed. Patient states she takes as needed but not daily due to cost     [provider]  omeprazole (PRILOSEC) 40 MG capsule Take 1 capsule (40 mg total) by mouth daily. 04/01/19   Mikey Kirschner, MD  oxyCODONE-acetaminophen (PERCOCET/ROXICET) 5-325 MG tablet Take 1 tablet by mouth every 8 (eight) hours as needed for severe pain.     [provider]  Pyridoxine HCl (VITAMIN B-6) 250 MG tablet Take 250 mg by mouth daily.    [provider]  zolpidem  (AMBIEN) 10 MG tablet TAKE (1) TABLET BY MOUTH AT BEDTIME AS NEEDED. 04/01/19   Mikey Kirschner, MD    Family History Family History  Problem Relation Age of Onset  . Hypertension Father   . Diabetes Father   . Hypertension Mother   . Colon polyps Mother   . Hypertension Sister   . Hyperlipidemia Sister   . Colon polyps Brother     Social History Social History   Tobacco Use  . Smoking status: Never Smoker  . Smokeless tobacco: Never Used  Substance Use Topics  . Alcohol use: Yes    Comment: occ  . Drug use: No     Allergies   Codeine, Sulfa antibiotics, Other, Metformin and related,  and Penicillins   Review of Systems Review of Systems  Constitutional: Negative.   HENT: Negative.   Respiratory: Positive for cough, chest tightness and wheezing.        Rib cage pain  Cardiovascular: Negative.   Gastrointestinal: Negative.   Neurological: Negative.   ROS: All other are negatives   Physical Exam Triage Vital Signs ED Triage Vitals [08/23/19 0839]  Enc Vitals Group     BP (!) 165/82     Pulse Rate (!) 107     Resp 18     Temp 99.4 F (37.4 C)     Temp Source Oral     SpO2 93 %     Weight      Height      Head Circumference      Peak Flow      Pain Score      Pain Loc      Pain Edu?      Excl. in [place]?    No data found.  Updated Vital Signs BP (!) 165/82 (BP Location: Right Arm)   Pulse (!) 107   Temp 99.4 F (37.4 C) (Oral)   Resp 18   LMP 09/03/2013   SpO2 93%   Visual Acuity Right Eye Distance:   Left Eye Distance:   Bilateral Distance:    Right Eye Near:   Left Eye Near:    Bilateral Near:     Physical Exam Vitals and nursing note reviewed.  Constitutional:      General: She is not in acute distress.    Appearance: Normal appearance. She is normal weight. She is not ill-appearing or toxic-appearing.  HENT:     Head: Normocephalic.     Right Ear: Tympanic membrane, ear canal and external ear normal. There is no impacted cerumen.       Left Ear: Tympanic membrane, ear canal and external ear normal. There is no impacted cerumen.     Nose: Nose normal. No congestion.     Mouth/Throat:     Mouth: Mucous membranes are moist.     Pharynx: No oropharyngeal exudate or posterior oropharyngeal erythema.  Cardiovascular:     Rate and Rhythm: Normal rate and regular rhythm.     Pulses: Normal pulses.     Heart sounds: Normal heart sounds. No murmur.  Pulmonary:     Effort: Pulmonary effort is normal. No respiratory distress.     Breath sounds: No wheezing or rhonchi.  Chest:     Chest wall: No tenderness.  Abdominal:     General: Abdomen is flat. Bowel sounds are normal. There is no distension.     Palpations: There is no mass.  Skin:    Capillary Refill: Capillary refill takes less than 2 seconds.  Neurological:     Mental Status: She is alert and oriented to person, place, and time.      UC Treatments / Results  Labs (all labs ordered are listed, but only abnormal results are displayed) Labs Reviewed  NOVEL CORONAVIRUS, NAA    EKG   Radiology No results found.  Procedures Procedures (including critical care time)  Medications Ordered in UC Medications - No data to display  Initial Impression / Assessment and Plan / UC Course  I have reviewed the triage vital signs and the nursing notes.  Pertinent labs & imaging results that were available during my care of the patient were reviewed by me and considered in my medical decision making (see chart for details).  Chest x-ray was ordered and results reviewed. Negative chest X-ray. Covid test was ordered.  Patient stable for discharge.  Advised patient to quarantine until COVID-19 with test results become available.  Follow with PCP or go to ED for worsening of symptoms. Patient verbalized understanding of the plan of care.  Final Clinical Impressions(s) / UC Diagnoses   Final diagnoses:  Suspected COVID-19 virus infection  Cough     Discharge  Instructions     COVID testing ordered.  It will take between 2-7 days for test results.  Someone will contact you regarding abnormal results.    In the meantime: You should remain isolated in your home for 10 days from symptom onset  Get plenty of rest and push fluids Continue to use Tessalon Perles prescribed for cough Flonase prescribed for nasal congestion and runny nose Use medications daily for symptom relief Use OTC medications like ibuprofen or tylenol as needed fever or pain Call or go to the ED if you have any new or worsening symptoms such as fever, worsening cough, shortness of breath, chest tightness, chest pain, turning blue, changes in mental status, etc...     ED Prescriptions    None     PDMP not reviewed this encounter.   Emerson Monte, [place] 08/23/19 [phone]

## 2019-08-25 LAB — NOVEL CORONAVIRUS, NAA: SARS-CoV-2, NAA: NOT DETECTED

## 2019-08-29 ENCOUNTER — Other Ambulatory Visit: Payer: Self-pay

## 2019-08-29 ENCOUNTER — Encounter: Payer: Self-pay | Admitting: Family Medicine

## 2019-08-29 ENCOUNTER — Ambulatory Visit (INDEPENDENT_AMBULATORY_CARE_PROVIDER_SITE_OTHER): Payer: Medicare Other | Admitting: Family Medicine

## 2019-08-29 DIAGNOSIS — J4520 Mild intermittent asthma, uncomplicated: Secondary | ICD-10-CM | POA: Diagnosis not present

## 2019-08-29 MED ORDER — PREDNISONE 10 MG PO TABS: ORAL_TABLET | ORAL | 0 refills | Status: DC

## 2019-08-29 MED ORDER — HYDROCODONE-HOMATROPINE 5-1.5 MG/5ML PO SYRP: ORAL_SOLUTION | ORAL | 0 refills | Status: DC

## 2019-08-29 MED ORDER — LEVOFLOXACIN 500 MG PO TABS: 500.0000 mg | ORAL_TABLET | Freq: Every day | ORAL | 0 refills | Status: DC

## 2019-08-29 NOTE — Progress Notes (Signed)
   Subjective:  Audio only  Patient ID: Laura Scott, female    DOB: 1961/06/01, 59 y.o.   MRN: [id number]  Cough This is a new problem. Episode onset: 2 weeks. Associated symptoms include wheezing. Treatments tried: tessalon, cefzil, , inhaler, otc cough syrup.  pt states she has had two negative covid test.   Virtual Visit via Telephone Note  I connected with Laura Scott on 08/29/19 at 10:30 AM EST by telephone and verified that I am speaking with the correct person using two identifiers.  Location: Patient: home Provider: office   I discussed the limitations, risks, security and privacy concerns of performing an evaluation and management service by telephone and the availability of in person appointments. I also discussed with the patient that there may be a patient responsible charge related to this service. The patient expressed understanding and agreed to proceed.   History of Present Illness:    Observations/Objective:   Assessment and Plan:   Follow Up Instructions:    I discussed the assessment and treatment plan with the patient. The patient was provided an opportunity to ask questions and all were answered. The patient agreed with the plan and demonstrated an understanding of the instructions.   The patient was advised to call back or seek an in-person evaluation if the symptoms worsen or if the condition fails to improve as anticipated.  I provided 18 minutes of non-face-to-face time during this encounter.   Ongoing substantial aggravations with cough.  Productive at times.  Wheezing in the chest.  Tight at times.  Has been through antibiotics without substantial help.  Cough is particularly bad at nighttime.  Definitely wheezy component.  No fever no chills.  Negative Covid testing x2   Review of Systems  Respiratory: Positive for cough and wheezing.        Objective:   Physical Exam Virtual       Assessment & Plan:  Impression persistent  bronchitis with reactive airways.  Hycodan nightly for severe cough.  More liberal use of albuterol discussed and encouraged.  1 more round of antibiotics will cover with quinolone for resistant.  Warning signs discussed

## 2019-09-12 ENCOUNTER — Telehealth: Payer: Self-pay | Admitting: Family Medicine

## 2019-09-12 MED ORDER — HYDROCODONE-HOMATROPINE 5-1.5 MG/5ML PO SYRP: ORAL_SOLUTION | ORAL | 0 refills | Status: DC

## 2019-09-12 NOTE — Telephone Encounter (Signed)
Pt had virtual visit on 1/7 and was given prednisone and cough medication. She is feeling better but states the cough and mucus has come back and would like to know if she can have a refill on HYDROcodone-homatropine (HYCODAN) 5-1.5 MG/5ML syrup  [place], [place] - [place]

## 2019-09-12 NOTE — Telephone Encounter (Signed)
ok 

## 2019-09-16 ENCOUNTER — Ambulatory Visit: Payer: Medicare Other | Admitting: "Endocrinology

## 2019-09-18 ENCOUNTER — Telehealth: Payer: Self-pay | Admitting: Family Medicine

## 2019-09-18 NOTE — Telephone Encounter (Signed)
Pt still has cough she has had cough since December and would like to know what she can do for it. She's had a virtual visit and been to urgent care twice. She is taking OTC cough medicine and started mucinex yesterday.   [place], [place]

## 2019-09-18 NOTE — Telephone Encounter (Signed)
F to f appt Friday or next mid week

## 2019-09-18 NOTE — Telephone Encounter (Signed)
Please advise. Thank you

## 2019-09-18 NOTE — Telephone Encounter (Signed)
Pt contacted and verbalized understanding. Pt transferred up front to schedule appt.

## 2019-09-19 ENCOUNTER — Other Ambulatory Visit: Payer: Self-pay | Admitting: Family Medicine

## 2019-09-19 NOTE — Telephone Encounter (Signed)
Last med check up 04/01/19

## 2019-09-20 ENCOUNTER — Other Ambulatory Visit: Payer: Self-pay | Admitting: Family Medicine

## 2019-09-20 ENCOUNTER — Encounter: Payer: Self-pay | Admitting: Family Medicine

## 2019-09-20 ENCOUNTER — Ambulatory Visit (INDEPENDENT_AMBULATORY_CARE_PROVIDER_SITE_OTHER): Payer: Medicare Other | Admitting: Family Medicine

## 2019-09-20 ENCOUNTER — Other Ambulatory Visit: Payer: Self-pay

## 2019-09-20 VITALS — BP 144/70 | Temp 97.9°F | Ht 66.0 in | Wt 176.0 lb

## 2019-09-20 DIAGNOSIS — J4541 Moderate persistent asthma with (acute) exacerbation: Secondary | ICD-10-CM

## 2019-09-20 MED ORDER — BUDESONIDE-FORMOTEROL FUMARATE 160-4.5 MCG/ACT IN AERO: 2.0000 | INHALATION_SPRAY | Freq: Two times a day (BID) | RESPIRATORY_TRACT | 5 refills | Status: AC

## 2019-09-20 MED ORDER — PREDNISONE 20 MG PO TABS: ORAL_TABLET | ORAL | 0 refills | Status: DC

## 2019-09-20 NOTE — Progress Notes (Signed)
   Subjective:    Patient ID: Laura Scott, female    DOB: [birth year], 59 y.o.   MRN: [id number]  Cough This is a new problem. Episode onset: one month. Associated symptoms include headaches and shortness of breath. Treatments tried: ventolin, tesalon, cefzil, levaquin, prednison, hycdan, mucinex.   Cough at night when sh lays doewn   Worse whe lays down  Notes a sound coming out tof th throat  Review of Systems  Respiratory: Positive for cough and shortness of breath.   Neurological: Positive for headaches.  Patient had viral episode a couple months ago.  Had Covid test negative x2.  Several within her close circle of family did have true Covid.  Developed subsequent cough wheezing.  Has never had asthma.  Not a smoker.  No childhood asthma.  Now day and night coughing releasing literally for 2 months.  See prior notes     Objective:   Physical Exam  Alert active good hydration HEENT slight nasal congestion lungs impressive bilateral wheezes no tachypnea no crackles heart regular      Assessment & Plan:  Impression moderate persistent asthma equivalent multiple months post viral syndrome which could well have been COVID-19.  Discussed.  Add twice daily Symbicort rationale discussed with 1 more prednisone taper due to severity wheezes

## 2019-09-25 ENCOUNTER — Encounter: Payer: Self-pay | Admitting: Family Medicine

## 2019-09-27 ENCOUNTER — Encounter: Payer: Self-pay | Admitting: "Endocrinology

## 2019-09-27 ENCOUNTER — Other Ambulatory Visit: Payer: Self-pay

## 2019-09-27 ENCOUNTER — Ambulatory Visit (INDEPENDENT_AMBULATORY_CARE_PROVIDER_SITE_OTHER): Payer: Medicare Other | Admitting: "Endocrinology

## 2019-09-27 VITALS — BP 146/73 | HR 86 | Ht 66.0 in | Wt 175.6 lb

## 2019-09-27 DIAGNOSIS — E1165 Type 2 diabetes mellitus with hyperglycemia: Secondary | ICD-10-CM | POA: Diagnosis not present

## 2019-09-27 DIAGNOSIS — E782 Mixed hyperlipidemia: Secondary | ICD-10-CM

## 2019-09-27 DIAGNOSIS — I10 Essential (primary) hypertension: Secondary | ICD-10-CM | POA: Diagnosis not present

## 2019-09-27 LAB — POCT GLYCOSYLATED HEMOGLOBIN (HGB A1C): Hemoglobin A1C: 6.4 % — AB (ref 4.0–5.6)

## 2019-09-27 MED ORDER — GLIPIZIDE ER 5 MG PO TB24: 5.0000 mg | ORAL_TABLET | Freq: Every day | ORAL | 1 refills | Status: DC

## 2019-09-27 MED ORDER — METFORMIN HCL ER 500 MG PO TB24: 500.0000 mg | ORAL_TABLET | Freq: Every day | ORAL | 1 refills | Status: DC

## 2019-09-27 NOTE — Patient Instructions (Signed)
                                     Advice for Weight Management  -For most of us the best way to lose weight is by diet management. Generally speaking, diet management means consuming less calories intentionally which over time brings about progressive weight loss.  This can be achieved more effectively by restricting carbohydrate consumption to the minimum possible.  So, it is critically important to know your numbers: how much calorie you are consuming and how much calorie you need. More importantly, our carbohydrates sources should be unprocessed or minimally processed complex starch food items.   Sometimes, it is important to balance nutrition by increasing protein intake (animal or plant source), fruits, and vegetables.  -Sticking to a routine mealtime to eat 3 meals a day and avoiding unnecessary snacks is shown to have a big role in weight control. Under normal circumstances, the only time we lose real weight is when we are hungry, so allow hunger to take place- hunger means no food between meal times, only water.  It is not advisable to starve.   -It is better to avoid simple carbohydrates including: Cakes, Sweet Desserts, Ice Cream, Soda (diet and regular), Sweet Tea, Candies, Chips, Cookies, Store Bought Juices, Alcohol in Excess of  1-2 drinks a day, Artificial Sweeteners, Doughnuts, Coffee Creamers, "Sugar-free" Products, etc, etc.  This is not a complete list.....    -Consulting with certified diabetes educators is proven to provide you with the most accurate and current information on diet.  Also, you may be  interested in discussing diet options/exchanges , we can schedule a visit with Penny Crumpton, RDN, CDE for individualized nutrition education.  -Exercise: If you are able: 30 -60 minutes a day ,4 days a week, or 150 minutes a week.  The longer the better.  Combine stretch, strength, and aerobic activities.  If you were told in the past that you  have high risk for cardiovascular diseases, you may seek evaluation by your heart doctor prior to initiating moderate to intense exercise programs.                                  Additional Care Considerations for Diabetes   -Diabetes  is a chronic disease.  The most important care consideration is regular follow-up with your diabetes care provider with the goal being avoiding or delaying its complications and to take advantage of advances in medications and technology.    -Type 2 diabetes is known to coexist with other important comorbidities such as high blood pressure and high cholesterol.  It is critical to control not only the diabetes but also the high blood pressure and high cholesterol to minimize and delay the risk of complications including coronary artery disease, stroke, amputations, blindness, etc.    - Studies showed that people with diabetes will benefit from a class of medications known as ACE inhibitors and statins.  Unless there are specific reasons not to be on these medications, the standard of care is to consider getting one from these groups of medications at an optimal doses.  These medications are generally considered safe and proven to help protect the heart and the kidneys.    - People with diabetes are encouraged to initiate and maintain regular follow-up with eye doctors, foot doctors, dentists ,   and if necessary heart and kidney doctors.     - It is highly recommended that people with diabetes quit smoking or stay away from smoking, and get yearly  flu vaccine and pneumonia vaccine at least every 5 years.  One other important lifestyle recommendation is to ensure adequate sleep - at least 6-7 hours of uninterrupted sleep at night.  -Exercise: If you are able: 30 -60 minutes a day, 4 days a week, or 150 minutes a week.  The longer the better.  Combine stretch, strength, and aerobic activities.  If you were told in the past that you have high risk for cardiovascular  diseases, you may seek evaluation by your heart doctor prior to initiating moderate to intense exercise programs.     COVID-19 Vaccine Information can be found at: [email or web address] For questions related to vaccine distribution or appointments, please email [email or web address] or call [phone]        

## 2019-09-27 NOTE — Progress Notes (Signed)
Endocrinology Consult Note       09/27/2019, 8:59 AM   Subjective:    Patient ID: Laura Scott, female    DOB: 1961-05-17.  Laura Scott is being seen in consultation for management of currently uncontrolled symptomatic diabetes requested by  Mikey Kirschner, MD.   Past Medical History:  Diagnosis Date  . Allergic rhinitis   . Anxiety   . Chronic pain   . Depression   . Diabetes mellitus without complication ([place])   . GERD (gastroesophageal reflux disease)   . Headache   . Hyperlipidemia   . Hypertension   . IBS (irritable bowel syndrome)   . PMS (premenstrual syndrome)   . Reflux     Past Surgical History:  Procedure Laterality Date  . COLONOSCOPY  08/29/2011   [name]: normal. high risk screening tcs in 5 years.   . COLONOSCOPY WITH PROPOFOL N/A 10/20/2016   Procedure: COLONOSCOPY WITH PROPOFOL;  Surgeon: Daneil Dolin, MD;  [facility];  Service: Endoscopy;  Laterality: N/A;  10:45am  . ESOPHAGOGASTRODUODENOSCOPY (EGD) WITH PROPOFOL N/A 10/20/2016   Procedure: ESOPHAGOGASTRODUODENOSCOPY (EGD) WITH PROPOFOL;  Surgeon: Daneil Dolin, MD;  Location: [facility];  Service: Endoscopy;  Laterality: N/A;  . LUMBAR [place]    . MALONEY DILATION N/A 10/20/2016   Procedure: Venia Minks DILATION;  Surgeon: Daneil Dolin, MD;  [facility];  Service: Endoscopy;  Laterality: N/A;  . POLYPECTOMY  10/20/2016   Procedure: POLYPECTOMY;  Surgeon: Daneil Dolin, MD;  [facility]: [facility];  Service: Endoscopy;;  cecal and sigmoid polypectomies    Social History   Socioeconomic History  . Marital status: Divorced    Spouse name: Not on file  . Number of children: Not on file  . Years of education: Not on file  . Highest education level: Not on file  Occupational History  . Not on file  Tobacco Use  . Smoking status: Never Smoker  . Smokeless tobacco: Never Used  Substance  and Sexual Activity  . Alcohol use: Yes    Comment: occ  . Drug use: No  . Sexual activity: Yes    Birth control/protection: None, Post-menopausal  Other Topics Concern  . Not on file  Social History Narrative  . Not on file   Social Determinants of Health   Financial Resource Strain:   . Difficulty of Paying Living Expenses: Not on file  Food Insecurity:   . Worried About Charity fundraiser in the Last Year: Not on file  . Ran Out of Food in the Last Year: Not on file  Transportation Needs:   . Lack of Transportation (Medical): Not on file  . Lack of Transportation (Non-Medical): Not on file  Physical Activity:   . Days of Exercise per Week: Not on file  . Minutes of Exercise per Session: Not on file  Stress:   . Feeling of Stress : Not on file  Social Connections:   . Frequency of Communication with Friends and Family: Not on file  . Frequency of Social Gatherings with Friends and Family: Not on file  . Attends  Religious Services: Not on file  . Active Member of Clubs or Organizations: Not on file  . Attends Archivist Meetings: Not on file  . Marital Status: Not on file    Family History  Problem Relation Age of Onset  . Hypertension Father   . Diabetes Father   . Hypertension Mother   . Colon polyps Mother   . Hypertension Sister   . Hyperlipidemia Sister   . Colon polyps Brother     Outpatient Encounter Medications as of 09/27/2019  Medication Sig  . atorvastatin (LIPITOR) 20 MG tablet Take 20 mg by mouth daily.  Marland Kitchen ACCU-CHEK GUIDE test strip USE TO TEST ONCE DAILY.  Marland Kitchen albuterol (VENTOLIN HFA) 108 (90 Base) MCG/ACT inhaler Inhale 1-2 puffs into the lungs every 6 (six) hours as needed for wheezing or shortness of breath.  Marland Kitchen albuterol (VENTOLIN HFA) 108 (90 Base) MCG/ACT inhaler Inhale 2 puffs into lungs QID prn wheezing  . ALPRAZolam (XANAX) 0.5 MG tablet TAKE (1) TABLET BY MOUTH EVERY SIX HOURS AS NEEDED. MAY FILL MONTHLY PER MD.  . blood glucose  meter kit and supplies KIT Dispense based on patient and insurance preference. Tests sugar once a day. DX E11.9  . budesonide-formoterol (SYMBICORT) 160-4.5 MCG/ACT inhaler Inhale 2 puffs into the lungs 2 (two) times daily.  . Cyanocobalamin (VITAMIN B-12) 5000 MCG SUBL Place 1 tablet under the tongue daily.  . cyclobenzaprine (FLEXERIL) 10 MG tablet   . escitalopram (LEXAPRO) 20 MG tablet TAKE ONE TABLET BY MOUTH DAILY.  . fluticasone (FLONASE) 50 MCG/ACT nasal spray Place 2 sprays into both nostrils daily.  Marland Kitchen gabapentin (NEURONTIN) 300 MG capsule TAKE 1 CAPSULE BY MOUTH THREE TIMES A DAY.  Marland Kitchen glipiZIDE (GLIPIZIDE XL) 5 MG 24 hr tablet Take 1 tablet (5 mg total) by mouth daily with breakfast. TAKE 2 TABLETS BY MOUTH EACH MORNING.  . hydrochlorothiazide (HYDRODIURIL) 25 MG tablet TAKE (1) TABLET BY MOUTH ONCE DAILY AS NEEDED.  Marland Kitchen loratadine (CLARITIN) 10 MG tablet Take 10 mg by mouth daily.  Marland Kitchen losartan (COZAAR) 50 MG tablet TAKE 1 TABLET BY MOUTH DAILY.  . metFORMIN (GLUCOPHAGE XR) 500 MG 24 hr tablet Take 1 tablet (500 mg total) by mouth daily with breakfast.  . Microlet Lancets MISC USE TO TEST BLOOD SUGAR ONCE DAILY.  Marland Kitchen omeprazole (PRILOSEC) 40 MG capsule Take 1 capsule (40 mg total) by mouth daily.  . predniSONE (DELTASONE) 20 MG tablet Take 3 qd for 3 days, then 2 qd for 3 days, then one qd for 3 days  . zolpidem (AMBIEN) 10 MG tablet TAKE (1) TABLET BY MOUTH AT BEDTIME AS NEEDED.  . [DISCONTINUED] glipiZIDE (GLIPIZIDE XL) 5 MG 24 hr tablet TAKE 2 TABLETS BY MOUTH EACH MORNING.   No facility-administered encounter medications on file as of 09/27/2019.    ALLERGIES: Allergies  Allergen Reactions  . Sulfa Antibiotics Hives, Diarrhea and Itching  . Other     SURGICAL TAPE Reaction: rash   . Penicillins Hives, Itching and Rash    Has patient had a PCN reaction causing immediate rash, facial/tongue/throat swelling, SOB or lightheadedness with hypotension: Yes Has patient had a PCN reaction  causing severe rash involving mucus membranes or skin necrosis: No Has patient had a PCN reaction that required hospitalization: No Has patient had a PCN reaction occurring within the last 10 years: Yes If all of the above answers are "NO", then may proceed with Cephalosporin use.    VACCINATION STATUS: Immunization History  Administered  Date(s) Administered  . Influenza,inj,Quad PF,6+ Mos 05/31/2018  . Influenza-Unspecified 05/23/2011  . Pneumococcal Polysaccharide-23 08/20/2018    Diabetes She presents for her initial diabetic visit. She has type 2 diabetes mellitus. Onset time: She was diagnosed at approximate age of 33 years. Her disease course has been fluctuating. There are no hypoglycemic associated symptoms. Pertinent negatives for hypoglycemia include no confusion, headaches, pallor or seizures. There are no diabetic associated symptoms. Pertinent negatives for diabetes include no chest pain, no polydipsia, no polyphagia and no polyuria. There are no hypoglycemic complications. Symptoms are improving. There are no diabetic complications. Risk factors for coronary artery disease include diabetes mellitus, dyslipidemia, family history, hypertension, post-menopausal and sedentary lifestyle. Current diabetic treatment includes oral agent (monotherapy) (She is currently taking glipizide 10 mg p.o. daily.). Her weight is fluctuating minimally. She is following a generally unhealthy diet. When asked about meal planning, she reported none. She has not had a previous visit with a dietitian. She rarely participates in exercise. (She did not bring any logs nor meter to review.  Her point-of-care A1c today is 6.4%, generally improving from 12.1%.) An ACE inhibitor/angiotensin II receptor blocker is being taken.  Hyperlipidemia This is a chronic problem. The current episode started more than 1 year ago. The problem is uncontrolled. Exacerbating diseases include diabetes. Pertinent negatives include no  chest pain, myalgias or shortness of breath. Current antihyperlipidemic treatment includes statins. Risk factors for coronary artery disease include dyslipidemia, diabetes mellitus, hypertension, a sedentary lifestyle and post-menopausal.  Hypertension This is a chronic problem. The current episode started more than 1 year ago. Pertinent negatives include no chest pain, headaches, palpitations or shortness of breath. Risk factors for coronary artery disease include dyslipidemia, diabetes mellitus, family history, sedentary lifestyle and post-menopausal state. Past treatments include diuretics and angiotensin blockers.     Review of Systems  Constitutional: Negative for chills, fever and unexpected weight change.  HENT: Negative for trouble swallowing and voice change.   Eyes: Negative for visual disturbance.  Respiratory: Negative for cough, shortness of breath and wheezing.   Cardiovascular: Negative for chest pain, palpitations and leg swelling.  Gastrointestinal: Negative for diarrhea, nausea and vomiting.  Endocrine: Negative for cold intolerance, heat intolerance, polydipsia, polyphagia and polyuria.  Musculoskeletal: Negative for arthralgias and myalgias.  Skin: Negative for color change, pallor, rash and wound.  Neurological: Negative for seizures and headaches.  Psychiatric/Behavioral: Negative for confusion and suicidal ideas.    Objective:    Vitals with BMI 09/27/2019 09/20/2019 08/23/2019  Height _0  _1  -  Weight 175 lbs 10 oz 176 lbs -  BMI 21.97 58.83 -  Systolic 254 982 641  Diastolic 73 70 82  Pulse 86 - 107    BP (!) 146/73   Pulse 86   Ht _2  (1.676 m)   Wt 175 lb 9.6 oz (79.7 kg)   LMP 09/03/2013   BMI 28.34 kg/m   Wt Readings from Last 3 Encounters:  09/27/19 175 lb 9.6 oz (79.7 kg)  09/20/19 176 lb (79.8 kg)  11/20/18 180 lb (81.6 kg)     Physical Exam Constitutional:      Appearance: She is well-developed.  HENT:     Head: Normocephalic and  atraumatic.  Neck:     Thyroid: No thyromegaly.     Trachea: No tracheal deviation.  Cardiovascular:     Rate and Rhythm: Normal rate and regular rhythm.  Pulmonary:     Effort: Pulmonary effort is normal.  Abdominal:  General: Bowel sounds are normal.     Palpations: Abdomen is soft.     Tenderness: There is no abdominal tenderness. There is no guarding.  Musculoskeletal:        General: Normal range of motion.     Cervical back: Normal range of motion and neck supple.  Skin:    General: Skin is warm and dry.     Coloration: Skin is not pale.     Findings: No erythema or rash.  Neurological:     Mental Status: She is alert and oriented to person, place, and time.     Cranial Nerves: No cranial nerve deficit.     Coordination: Coordination normal.     Deep Tendon Reflexes: Reflexes are normal and symmetric.  Psychiatric:        Judgment: Judgment normal.       CMP     Component Value Date/Time   NA 143 09/10/2018 1024   K 4.3 09/10/2018 1024   CL 100 09/10/2018 1024   CO2 23 09/10/2018 1024   GLUCOSE 163 (H) 09/10/2018 1024   GLUCOSE 118 (H) 03/15/2017 1351   BUN 13 09/10/2018 1024   CREATININE 0.94 09/10/2018 1024   CREATININE 0.77 09/10/2014 0805   CALCIUM 9.8 09/10/2018 1024   PROT 7.7 09/10/2018 1024   ALBUMIN 4.7 09/10/2018 1024   AST 27 09/10/2018 1024   ALT 27 09/10/2018 1024   ALKPHOS 102 09/10/2018 1024   BILITOT 0.6 09/10/2018 1024   GFRNONAA 68 09/10/2018 1024   GFRAA 78 09/10/2018 1024     Diabetic Labs (most recent): Lab Results  Component Value Date   HGBA1C 6.4 (A) 09/27/2019   HGBA1C 5.0 05/31/2018   HGBA1C 12.1 (A) 01/08/2018     Lipid Panel ( most recent) Lipid Panel     Component Value Date/Time   CHOL 221 (H) 09/10/2018 1024   TRIG 150 (H) 09/10/2018 1024   HDL 54 09/10/2018 1024   CHOLHDL 4.1 09/10/2018 1024   CHOLHDL 3.9 09/10/2014 0805   VLDL 25 09/10/2014 0805   LDLCALC 137 (H) 09/10/2018 1024   LABVLDL 30 09/10/2018  1024     Assessment & Plan:   1. Type 2 diabetes mellitus with hyperglycemia, without long-term current use of insulin (HCC)  - Laura Scott has currently uncontrolled symptomatic type 2 DM since  59 years of age,  with most recent A1c of 6.4 %, improving from 12.1%. Recent labs reviewed. - I had a long discussion with her about the progressive nature of diabetes and the pathology behind its complications. -She does not report any gross complications from her diabetes, however, she remains at a high risk for more acute and chronic complications which include CAD, CVA, CKD, retinopathy, and neuropathy. These are all discussed in detail with her.  - I have counseled her on diet  and weight management  by adopting a carbohydrate restricted/protein rich diet. Patient is encouraged to switch to  unprocessed or minimally processed     complex starch and increased protein intake (animal or plant source), fruits, and vegetables. -  she is advised to stick to a routine mealtimes to eat 3 meals  a day and avoid unnecessary snacks ( to snack only to correct hypoglycemia).   - she admits that there is a room for improvement in her food and drink choices. - Suggestion is made for her to avoid simple carbohydrates  from her diet including Cakes, Sweet Desserts, Ice Cream, Soda (diet and regular), Sweet  Tea, Candies, Chips, Cookies, Store Bought Juices, Alcohol in Excess of  1-2 drinks a day, Artificial Sweeteners,  Coffee Creamer, and "Sugar-free" Products. This will help patient to have more stable blood glucose profile and potentially avoid unintended weight gain.  - she will be scheduled with Jearld Fenton, RDN, CDE for diabetes education.  - I have approached her with the following individualized plan to manage  her diabetes and patient agrees:   -Based on her recent treatment response, she will not need insulin treatment for now. -She will benefit from low-dose Metformin.  I discussed and added  Metformin 500 mg XR p.o. daily after breakfast. -I advised her to lower her glipizide to 5 mg XL p.o. daily at breakfast. - she is encouraged to call clinic for blood glucose levels less than 70 or above 300 mg /dl.  - she will be considered for incretin therapy as appropriate next visit.  - Specific targets for  A1c;  LDL, HDL,  and Triglycerides were discussed with the patient.  2) Blood Pressure /Hypertension:  her blood pressure is not controlled to target.   she is advised to continue her current medications including HCTZ 25 mg p.o. daily with breakfast .  She is also on losartan 50 mg p.o. daily. 3) Lipids/Hyperlipidemia:   Review of her recent lipid panel showed un controlled  LDL at 137 .  she  is advised to continue    atorvastatin 20 mg daily at bedtime, she reports she did not tolerate 40 mg of atorvastatin.  side effects and precautions discussed with her.  4)  Weight/Diet:  Body mass index is 28.34 kg/m.  -   she is  a candidate for modest weight loss. I discussed with her the fact that loss of 5 - 10% of her  current body weight will have the most impact on her diabetes management.  Exercise, and detailed carbohydrates information provided  -  detailed on discharge instructions.  5) Chronic Care/Health Maintenance:  -she  is on ARB and statin medications and  is encouraged to initiate and continue to follow up with Ophthalmology, Dentist,  Podiatrist at least yearly or according to recommendations, and advised to   stay away from smoking. I have recommended yearly flu vaccine and pneumonia vaccine at least every 5 years; moderate intensity exercise for up to 150 minutes weekly; and  sleep for at least 7 hours a day.  - she is  advised to maintain close follow up with Mikey Kirschner, MD for primary care needs, as well as her other providers for optimal and coordinated care.   - Time spent in this patient care: 60 min, of which > 50% was spent in  counseling  her about her  uncontrolled type 2 diabetes, hyperlipidemia, hypertension and the rest reviewing her blood glucose logs , discussing her hypoglycemia and hyperglycemia episodes, reviewing her current and  previous labs / studies  ( including abstraction from other facilities) and medications  doses and developing a  long term treatment plan based on the latest standards of care/ guidelines; and documenting her care.    Please refer to Patient Instructions for Blood Glucose Monitoring and Insulin/Medications Dosing Guide"  in media tab for additional information. Please  also refer to " Patient Self Inventory" in the Media  tab for reviewed elements of pertinent patient history.  Laura Scott participated in the discussions, expressed understanding, and voiced agreement with the above plans.  All questions were answered to her satisfaction. she  is encouraged to contact clinic should she have any questions or concerns prior to her return visit.   Follow up plan: - Return in about 3 months (around 12/25/2019) for Bring Meter and Logs- A1c in Office, Follow up with Pre-visit Labs.  Glade Lloyd, MD [facility] [address] [place], [place] [address] Phone: [phone]  Fax: [phone]    09/27/2019, 8:59 AM  This note was partially dictated with voice recognition software. Similar sounding words can be transcribed inadequately or may not  be corrected upon review.

## 2019-10-07 ENCOUNTER — Other Ambulatory Visit: Payer: Self-pay | Admitting: Family Medicine

## 2019-10-07 NOTE — Telephone Encounter (Signed)
6 mo 

## 2019-10-21 ENCOUNTER — Telehealth: Payer: Self-pay | Admitting: Family Medicine

## 2019-10-21 ENCOUNTER — Other Ambulatory Visit: Payer: Self-pay | Admitting: Family Medicine

## 2019-10-21 DIAGNOSIS — R928 Other abnormal and inconclusive findings on diagnostic imaging of breast: Secondary | ICD-10-CM

## 2019-10-21 NOTE — Telephone Encounter (Signed)
Pt was supposed to do f u u s 8 mo ago, order diag mammo and u s

## 2019-10-21 NOTE — Telephone Encounter (Signed)
Diagnostic Mammo and ultrasound scheduled at [facility] 11/05/19 @ 10am. Patient notified and verbalized understanding.

## 2019-10-21 NOTE — Telephone Encounter (Signed)
Pt is needing an order for diagnostic mammo. She said they normally ultrasound her left breast as well.

## 2019-11-02 ENCOUNTER — Ambulatory Visit: Payer: Medicare Other | Attending: Internal Medicine

## 2019-11-02 DIAGNOSIS — Z23 Encounter for immunization: Secondary | ICD-10-CM

## 2019-11-02 NOTE — Progress Notes (Signed)
   Covid-19 Vaccination Clinic  Name:  Laura Scott    MRN: [id number] DOB: Dec 26, 1960  11/02/2019  Laura Scott was observed post Covid-19 immunization for 15 minutes without incident. She was provided with Vaccine Information Sheet and instruction to access the V-Safe system.   Laura Scott was instructed to call 911 with any severe reactions post vaccine: Marland Kitchen Difficulty breathing  . Swelling of face and throat  . A fast heartbeat  . A bad rash all over body  . Dizziness and weakness   Immunizations Administered    Name Date Dose VIS Date Route   Moderna COVID-19 Vaccine 11/02/2019 11:29 AM 0.5 mL 07/23/2019 Intramuscular   Manufacturer: Moderna   Lot: [id number]   [id number]

## 2019-11-05 ENCOUNTER — Ambulatory Visit (HOSPITAL_COMMUNITY): Payer: Medicare Other

## 2019-11-05 ENCOUNTER — Encounter (HOSPITAL_COMMUNITY): Admission: RE | Admit: 2019-11-05 | Payer: Medicare Other | Source: Ambulatory Visit

## 2019-11-13 ENCOUNTER — Other Ambulatory Visit: Payer: Self-pay

## 2019-11-13 ENCOUNTER — Encounter: Payer: Medicare Other | Attending: "Endocrinology | Admitting: Nutrition

## 2019-11-13 ENCOUNTER — Encounter: Payer: Self-pay | Admitting: Nutrition

## 2019-11-13 VITALS — Ht 65.5 in | Wt 172.8 lb

## 2019-11-13 DIAGNOSIS — E1165 Type 2 diabetes mellitus with hyperglycemia: Secondary | ICD-10-CM | POA: Diagnosis not present

## 2019-11-13 DIAGNOSIS — E782 Mixed hyperlipidemia: Secondary | ICD-10-CM | POA: Insufficient documentation

## 2019-11-13 DIAGNOSIS — I10 Essential (primary) hypertension: Secondary | ICD-10-CM | POA: Insufficient documentation

## 2019-11-13 NOTE — Progress Notes (Signed)
  Medical Nutrition Therapy:  Appt start time: 1330 end time:  1430.   Assessment:  Primary concerns today: Diabetes Type 2. See Dr. Dorris Fetch.  LIves with her brother. She does the cooking and brother. Eating 2-3  meals per day. She has cut out a lot of junk food. A1C 6.4%. Sees Dr. Dorris Fetch. Working on eating meals on time and not skipping breakfast. Motivated to continue to improve [name] and lose some weight. Wt Readings from Last 3 Encounters:  11/13/19 172 lb 12.8 oz (78.4 kg)  09/27/19 175 lb 9.6 oz (79.7 kg)  09/20/19 176 lb (79.8 kg)   Ht Readings from Last 3 Encounters:  11/13/19 5' 5.5" (1.664 m)  09/27/19 5\' 6"  (1.676 m)  09/20/19 5\' 6"  (1.676 m)   Body mass index is 28.32 kg/m. @BMIFA @ Facility age limit for growth percentiles is 20 years. Facility age limit for growth percentiles is 20 years.   Lab Results  Component Value Date   HGBA1C 6.4 (A) 09/27/2019   CMP Latest Ref Rng & Units 09/10/2018 03/15/2017 02/03/2017  Glucose 65 - 99 mg/dL 163(H) 118(H) 72  BUN 6 - 24 mg/dL 13 13 11   Creatinine 0.57 - 1.00 mg/dL 0.94 0.76 0.78  Sodium 134 - 144 mmol/L 143 137 143  Potassium 3.5 - 5.2 mmol/L 4.3 3.8 4.2  Chloride 96 - 106 mmol/L 100 99(L) 101  CO2 20 - 29 mmol/L 23 30 26   Calcium 8.7 - 10.2 mg/dL 9.8 9.0 10.0  Total Protein 6.0 - 8.5 g/dL 7.7 - 7.8  Total Bilirubin 0.0 - 1.2 mg/dL 0.6 - 0.3  Alkaline Phos 39 - 117 IU/L 102 - 100  AST 0 - 40 IU/L 27 - 62(H)  ALT 0 - 32 IU/L 27 - 68(H)      Preferred Learning Style:   No preference indicated   Learning Readiness:   Ready  Change in progress   MEDICATIONS:   DIETARY INTAKE:   24-hr recall:  B ( AM): fruit, egg  Snk ( AM):   L ( PM): sandwich, water, fruit  Snk ( PM):  D ( PM): chicken salad, water,  Snk ( PM): misc  Beverages: water,   Usual physical activity: walks some or goes to [employer].  Estimated energy needs: 1500 calories 170 g carbohydrates 112 g protein 42 g fat  Progress Towards  Goal(s):  In progress.   Nutritional Diagnosis:  NB-1.1 Food and nutrition-related knowledge deficit As related to Diabetes Type 2.  As evidenced by A1C 12%.    Intervention:  Nutrition and Diabetes education provided on My Plate, CHO counting, meal planning, portion sizes, timing of meals, avoiding snacks between meals unless having a low blood sugar, target ranges for A1C and blood sugars, signs/symptoms and treatment of hyper/hypoglycemia, monitoring blood sugars, taking medications as prescribed, benefits of exercising 30 minutes per day and prevention of complications of DM. Goals  Eat three meals per meals at times discussed Increase fresh fruits and vegetables. Drink only water  Don't eat past 7 pm Keep A1C 6.5% or less. .  Teaching Method Utilized:  Visual Auditory Hands on  Handouts given during visit include:  The Plate Method   Meal Plan Card  Diabetes Instructions.   Barriers to learning/adherence to lifestyle change: none  Demonstrated degree of understanding via:  Teach Back   Monitoring/Evaluation:  Dietary intake, exercise, , and body weight in 2 month(s).  Marland Kitchen

## 2019-11-13 NOTE — Patient Instructions (Signed)
Goals  Eat three meals per meals at times discussed Increase fresh fruits and vegetables. Drink only water  Don't eat past 7 pm Keep A1C 6.5% or less.

## 2019-11-20 ENCOUNTER — Other Ambulatory Visit: Payer: Self-pay | Admitting: Family Medicine

## 2019-11-26 ENCOUNTER — Ambulatory Visit (HOSPITAL_COMMUNITY)
Admission: RE | Admit: 2019-11-26 | Discharge: 2019-11-26 | Disposition: A | Discharge status: Home / Self Care | Payer: Medicare Other | Source: Ambulatory Visit | Attending: Family Medicine | Admitting: Family Medicine

## 2019-11-26 ENCOUNTER — Other Ambulatory Visit: Payer: Self-pay

## 2019-11-26 DIAGNOSIS — R928 Other abnormal and inconclusive findings on diagnostic imaging of breast: Secondary | ICD-10-CM | POA: Diagnosis not present

## 2019-11-26 DIAGNOSIS — N6323 Unspecified lump in the left breast, lower outer quadrant: Secondary | ICD-10-CM | POA: Diagnosis not present

## 2019-11-26 DIAGNOSIS — R922 Inconclusive mammogram: Secondary | ICD-10-CM | POA: Diagnosis not present

## 2019-12-03 ENCOUNTER — Other Ambulatory Visit: Payer: Self-pay | Admitting: Family Medicine

## 2019-12-03 NOTE — Telephone Encounter (Signed)
Ok plus three ref 

## 2019-12-04 ENCOUNTER — Ambulatory Visit: Payer: Medicare Other | Attending: Internal Medicine

## 2019-12-04 DIAGNOSIS — Z23 Encounter for immunization: Secondary | ICD-10-CM

## 2019-12-04 NOTE — Progress Notes (Signed)
   Covid-19 Vaccination Clinic  Name:  Laura Scott    MRN: [id number] DOB: 1961-03-08  12/04/2019  [name] was observed post Covid-19 immunization for 15 minutes without incident. She was provided with Vaccine Information Sheet and instruction to access the V-Safe system.   [name] was instructed to call 911 with any severe reactions post vaccine: Marland Kitchen Difficulty breathing  . Swelling of face and throat  . A fast heartbeat  . A bad rash all over body  . Dizziness and weakness   Immunizations Administered    Name Date Dose VIS Date Route   Moderna COVID-19 Vaccine 12/04/2019 11:16 AM 0.5 mL 07/23/2019 Intramuscular   Manufacturer: Moderna   Lot: [id number]   [id number]

## 2019-12-21 ENCOUNTER — Other Ambulatory Visit: Payer: Self-pay | Admitting: "Endocrinology

## 2019-12-23 DIAGNOSIS — E1165 Type 2 diabetes mellitus with hyperglycemia: Secondary | ICD-10-CM | POA: Diagnosis not present

## 2019-12-24 LAB — COMPLETE METABOLIC PANEL WITH GFR
AG Ratio: 1.4 (calc) (ref 1.0–2.5)
ALT: 25 U/L (ref 6–29)
AST: 24 U/L (ref 10–35)
Albumin: 4.2 g/dL (ref 3.6–5.1)
Alkaline phosphatase (APISO): 83 U/L (ref 37–153)
BUN: 12 mg/dL (ref 7–25)
CO2: 30 mmol/L (ref 20–32)
Calcium: 9.6 mg/dL (ref 8.6–10.4)
Chloride: 101 mmol/L (ref 98–110)
Creat: 1.03 mg/dL (ref 0.50–1.05)
GFR, Est African American: 69 mL/min/{1.73_m2} (ref >=60)
GFR, Est Non African American: 60 mL/min/{1.73_m2} (ref >=60)
Globulin: 3 g/dL (calc) (ref 1.9–3.7)
Glucose, Bld: 185 mg/dL — ABNORMAL HIGH (ref 65–139)
Potassium: 3.8 mmol/L (ref 3.5–5.3)
Sodium: 140 mmol/L (ref 135–146)
Total Bilirubin: 0.8 mg/dL (ref 0.2–1.2)
Total Protein: 7.2 g/dL (ref 6.1–8.1)

## 2019-12-24 LAB — T4, FREE: Free T4: 1 ng/dL (ref 0.8–1.8)

## 2019-12-24 LAB — MICROALBUMIN / CREATININE URINE RATIO
Creatinine, Urine: 240 mg/dL (ref 20–275)
Microalb Creat Ratio: 12 mcg/mg creat (ref <30)
Microalb, Ur: 2.8 mg/dL

## 2019-12-24 LAB — TSH: TSH: 2.8 mIU/L (ref 0.40–4.50)

## 2019-12-25 ENCOUNTER — Encounter: Payer: Self-pay | Admitting: "Endocrinology

## 2019-12-25 ENCOUNTER — Other Ambulatory Visit: Payer: Self-pay

## 2019-12-25 ENCOUNTER — Encounter: Payer: Medicare Other | Attending: "Endocrinology | Admitting: Nutrition

## 2019-12-25 ENCOUNTER — Encounter: Payer: Self-pay | Admitting: Nutrition

## 2019-12-25 ENCOUNTER — Ambulatory Visit (INDEPENDENT_AMBULATORY_CARE_PROVIDER_SITE_OTHER): Payer: Medicare Other | Admitting: "Endocrinology

## 2019-12-25 VITALS — BP 130/75 | HR 80 | Ht 66.0 in | Wt 176.8 lb

## 2019-12-25 VITALS — Wt 176.0 lb

## 2019-12-25 DIAGNOSIS — E782 Mixed hyperlipidemia: Secondary | ICD-10-CM | POA: Insufficient documentation

## 2019-12-25 DIAGNOSIS — E1165 Type 2 diabetes mellitus with hyperglycemia: Secondary | ICD-10-CM

## 2019-12-25 DIAGNOSIS — I10 Essential (primary) hypertension: Secondary | ICD-10-CM | POA: Diagnosis not present

## 2019-12-25 LAB — POCT GLYCOSYLATED HEMOGLOBIN (HGB A1C): Hemoglobin A1C: 5.5 % (ref 4.0–5.6)

## 2019-12-25 NOTE — Progress Notes (Signed)
12/25/2019, 6:13 PM  Endocrinology follow-up note   Subjective:    Patient ID: Laura Scott, female    DOB: [birth year].  Laura Scott is being seen in follow-up after she was seen in consultation for management of currently uncontrolled symptomatic diabetes requested by  Mikey Kirschner, MD.   Past Medical History:  Diagnosis Date  . Allergic rhinitis   . Anxiety   . Chronic pain   . Depression   . Diabetes mellitus without complication ([place])   . GERD (gastroesophageal reflux disease)   . Headache   . Hyperlipidemia   . Hypertension   . IBS (irritable bowel syndrome)   . PMS (premenstrual syndrome)   . Reflux     Past Surgical History:  Procedure Laterality Date  . COLONOSCOPY  08/29/2011   [name]: normal. high risk screening tcs in 5 years.   . COLONOSCOPY WITH PROPOFOL N/A 10/20/2016   Procedure: COLONOSCOPY WITH PROPOFOL;  Surgeon: Daneil Dolin, MD;  [facility];  Service: Endoscopy;  Laterality: N/A;  10:45am  . ESOPHAGOGASTRODUODENOSCOPY (EGD) WITH PROPOFOL N/A 10/20/2016   Procedure: ESOPHAGOGASTRODUODENOSCOPY (EGD) WITH PROPOFOL;  Surgeon: Daneil Dolin, MD;  Location: [facility];  Service: Endoscopy;  Laterality: N/A;  . LUMBAR [place]    . MALONEY DILATION N/A 10/20/2016   Procedure: Venia Minks DILATION;  Surgeon: Daneil Dolin, MD;  [facility];  Service: Endoscopy;  Laterality: N/A;  . POLYPECTOMY  10/20/2016   Procedure: POLYPECTOMY;  Surgeon: Daneil Dolin, MD;  [facility];  Service: Endoscopy;;  cecal and sigmoid polypectomies    Social History   Socioeconomic History  . Marital status: Divorced    Spouse name: Not on file  . Number of children: Not on file  . Years of education: Not on file  . Highest education level: Not on file  Occupational History  . Not on file  Tobacco Use  . Smoking status: Never Smoker  . Smokeless  tobacco: Never Used  Substance and Sexual Activity  . Alcohol use: Yes    Comment: occ  . Drug use: No  . Sexual activity: Yes    Birth control/protection: None, Post-menopausal  Other Topics Concern  . Not on file  Social History Narrative  . Not on file   Social Determinants of Health   Financial Resource Strain:   . Difficulty of Paying Living Expenses:   Food Insecurity:   . Worried About Charity fundraiser in the Last Year:   . Arboriculturist in the Last Year:   Transportation Needs:   . Film/video editor (Medical):   Marland Kitchen Lack of Transportation (Non-Medical):   Physical Activity:   . Days of Exercise per Week:   . Minutes of Exercise per Session:   Stress:   . Feeling of Stress :   Social Connections:   . Frequency of Communication with Friends and Family:   . Frequency of Social Gatherings with Friends and Family:   . Attends Religious Services:   . Active Member of Clubs or Organizations:   . Attends Archivist  Meetings:   Marland Kitchen Marital Status:     Family History  Problem Relation Age of Onset  . Hypertension Father   . Diabetes Father   . Hypertension Mother   . Colon polyps Mother   . Hypertension Sister   . Hyperlipidemia Sister   . Colon polyps Brother     Outpatient Encounter Medications as of 12/25/2019  Medication Sig  . Cholecalciferol (VITAMIN D) 125 MCG (5000 UT) CAPS Take 5,000 Units by mouth daily with breakfast.  . ACCU-CHEK GUIDE test strip USE TO TEST ONCE DAILY.  Marland Kitchen albuterol (VENTOLIN HFA) 108 (90 Base) MCG/ACT inhaler Inhale 1-2 puffs into the lungs every 6 (six) hours as needed for wheezing or shortness of breath.  Marland Kitchen albuterol (VENTOLIN HFA) 108 (90 Base) MCG/ACT inhaler Inhale 2 puffs into lungs QID prn wheezing  . ALPRAZolam (XANAX) 0.5 MG tablet TAKE (1) TABLET BY MOUTH EVERY SIX HOURS AS NEEDED. MAY FILL MONTHLY PER MD.  . atorvastatin (LIPITOR) 20 MG tablet Take 20 mg by mouth daily.  . blood glucose meter kit and supplies  KIT Dispense based on patient and insurance preference. Tests sugar once a day. DX E11.9  . budesonide-formoterol (SYMBICORT) 160-4.5 MCG/ACT inhaler Inhale 2 puffs into the lungs 2 (two) times daily.  . Cyanocobalamin (VITAMIN B-12) 5000 MCG SUBL Place 1 tablet under the tongue daily.  . cyclobenzaprine (FLEXERIL) 10 MG tablet   . escitalopram (LEXAPRO) 20 MG tablet TAKE ONE TABLET BY MOUTH DAILY.  . fluticasone (FLONASE) 50 MCG/ACT nasal spray Place 2 sprays into both nostrils daily.  Marland Kitchen gabapentin (NEURONTIN) 300 MG capsule TAKE 1 CAPSULE BY MOUTH THREE TIMES A DAY.  . hydrochlorothiazide (HYDRODIURIL) 25 MG tablet TAKE (1) TABLET BY MOUTH ONCE DAILY AS NEEDED.  Marland Kitchen loratadine (CLARITIN) 10 MG tablet Take 10 mg by mouth daily.  Marland Kitchen losartan (COZAAR) 50 MG tablet TAKE 1 TABLET BY MOUTH DAILY.  . metFORMIN (GLUCOPHAGE-XR) 500 MG 24 hr tablet TAKE 1 TABLET IN THE MORNING WITH BREAKFAST.  . Microlet Lancets MISC USE TO TEST BLOOD SUGAR ONCE DAILY.  Marland Kitchen omeprazole (PRILOSEC) 40 MG capsule Take 1 capsule (40 mg total) by mouth daily.  Marland Kitchen zolpidem (AMBIEN) 10 MG tablet TAKE (1) TABLET BY MOUTH AT BEDTIME AS NEEDED.  . [DISCONTINUED] glipiZIDE (GLIPIZIDE XL) 5 MG 24 hr tablet Take 1 tablet (5 mg total) by mouth daily with breakfast. TAKE 2 TABLETS BY MOUTH EACH MORNING.  . [DISCONTINUED] predniSONE (DELTASONE) 20 MG tablet Take 3 qd for 3 days, then 2 qd for 3 days, then one qd for 3 days   No facility-administered encounter medications on file as of 12/25/2019.    ALLERGIES: Allergies  Allergen Reactions  . Sulfa Antibiotics Hives, Diarrhea and Itching  . Other     SURGICAL TAPE Reaction: rash   . Penicillins Hives, Itching and Rash    Has patient had a PCN reaction causing immediate rash, facial/tongue/throat swelling, SOB or lightheadedness with hypotension: Yes Has patient had a PCN reaction causing severe rash involving mucus membranes or skin necrosis: No Has patient had a PCN reaction that  required hospitalization: No Has patient had a PCN reaction occurring within the last 10 years: Yes If all of the above answers are "NO", then may proceed with Cephalosporin use.    VACCINATION STATUS: Immunization History  Administered Date(s) Administered  . Influenza,inj,Quad PF,6+ Mos 05/31/2018  . Influenza-Unspecified 05/23/2011  . Moderna SARS-COVID-2 Vaccination 11/02/2019, 12/04/2019  . Pneumococcal Polysaccharide-23 08/20/2018    Diabetes  She presents for her follow-up diabetic visit. She has type 2 diabetes mellitus. Onset time: She was diagnosed at approximate age of 70 years. Her disease course has been improving. There are no hypoglycemic associated symptoms. Pertinent negatives for hypoglycemia include no confusion, headaches, pallor or seizures. There are no diabetic associated symptoms. Pertinent negatives for diabetes include no chest pain, no polydipsia, no polyphagia and no polyuria. There are no hypoglycemic complications. Symptoms are improving. There are no diabetic complications. Risk factors for coronary artery disease include diabetes mellitus, dyslipidemia, family history, hypertension, post-menopausal and sedentary lifestyle. Current diabetic treatment includes oral agent (monotherapy) (She is currently taking glipizide 10 mg p.o. daily.). Her weight is fluctuating minimally. She is following a generally unhealthy diet. When asked about meal planning, she reported none. She has not had a previous visit with a dietitian. She rarely participates in exercise. (She presents with a meter showing consistently on target readings, and A1c of 5.5%, overall improving from 12.1%.  ) An ACE inhibitor/angiotensin II receptor blocker is being taken.  Hyperlipidemia This is a chronic problem. The current episode started more than 1 year ago. The problem is uncontrolled. Exacerbating diseases include diabetes. Pertinent negatives include no chest pain, myalgias or shortness of breath.  Current antihyperlipidemic treatment includes statins. Risk factors for coronary artery disease include dyslipidemia, diabetes mellitus, hypertension, a sedentary lifestyle and post-menopausal.  Hypertension This is a chronic problem. The current episode started more than 1 year ago. Pertinent negatives include no chest pain, headaches, palpitations or shortness of breath. Risk factors for coronary artery disease include dyslipidemia, diabetes mellitus, family history, sedentary lifestyle and post-menopausal state. Past treatments include diuretics and angiotensin blockers.     Review of Systems  Constitutional: Negative for chills, fever and unexpected weight change.  HENT: Negative for trouble swallowing and voice change.   Eyes: Negative for visual disturbance.  Respiratory: Negative for cough, shortness of breath and wheezing.   Cardiovascular: Negative for chest pain, palpitations and leg swelling.  Gastrointestinal: Negative for diarrhea, nausea and vomiting.  Endocrine: Negative for cold intolerance, heat intolerance, polydipsia, polyphagia and polyuria.  Musculoskeletal: Negative for arthralgias and myalgias.  Skin: Negative for color change, pallor, rash and wound.  Neurological: Negative for seizures and headaches.  Psychiatric/Behavioral: Negative for confusion and suicidal ideas.    Objective:    Vitals with BMI 12/25/2019 12/25/2019 11/13/2019  Height - 5' 6" 5' 5.5"  Weight 176 lbs 176 lbs 13 oz 172 lbs 13 oz  BMI 28.42 28.41 32.44  Systolic - 010 -  Diastolic - 75 -  Pulse - 80 -    BP 130/75   Pulse 80   Ht 5' 6" (1.676 m)   Wt 176 lb 12.8 oz (80.2 kg)   LMP 09/03/2013   BMI 28.54 kg/m   Wt Readings from Last 3 Encounters:  12/25/19 176 lb (79.8 kg)  12/25/19 176 lb 12.8 oz (80.2 kg)  11/13/19 172 lb 12.8 oz (78.4 kg)     Physical Exam Constitutional:      Appearance: She is well-developed.  HENT:     Head: Normocephalic and atraumatic.  Neck:     Thyroid:  No thyromegaly.     Trachea: No tracheal deviation.  Cardiovascular:     Rate and Rhythm: Normal rate and regular rhythm.  Pulmonary:     Effort: Pulmonary effort is normal.  Abdominal:     General: Bowel sounds are normal.     Palpations: Abdomen is soft.  Tenderness: There is no abdominal tenderness. There is no guarding.  Musculoskeletal:        General: Normal range of motion.     Cervical back: Normal range of motion and neck supple.  Skin:    General: Skin is warm and dry.     Coloration: Skin is not pale.     Findings: No erythema or rash.  Neurological:     Mental Status: She is alert and oriented to person, place, and time.     Cranial Nerves: No cranial nerve deficit.     Coordination: Coordination normal.     Deep Tendon Reflexes: Reflexes are normal and symmetric.  Psychiatric:        Judgment: Judgment normal.       CMP     Component Value Date/Time   NA 140 12/23/2019 1036   NA 143 09/10/2018 1024   K 3.8 12/23/2019 1036   CL 101 12/23/2019 1036   CO2 30 12/23/2019 1036   GLUCOSE 185 (H) 12/23/2019 1036   BUN 12 12/23/2019 1036   BUN 13 09/10/2018 1024   CREATININE 1.03 12/23/2019 1036   CALCIUM 9.6 12/23/2019 1036   PROT 7.2 12/23/2019 1036   PROT 7.7 09/10/2018 1024   ALBUMIN 4.7 09/10/2018 1024   AST 24 12/23/2019 1036   ALT 25 12/23/2019 1036   ALKPHOS 102 09/10/2018 1024   BILITOT 0.8 12/23/2019 1036   BILITOT 0.6 09/10/2018 1024   GFRNONAA 60 12/23/2019 1036   GFRAA 69 12/23/2019 1036     Diabetic Labs (most recent): Lab Results  Component Value Date   HGBA1C 5.5 12/25/2019   HGBA1C 6.4 (A) 09/27/2019   HGBA1C 5.0 05/31/2018     Lipid Panel ( most recent) Lipid Panel     Component Value Date/Time   CHOL 221 (H) 09/10/2018 1024   TRIG 150 (H) 09/10/2018 1024   HDL 54 09/10/2018 1024   CHOLHDL 4.1 09/10/2018 1024   CHOLHDL 3.9 09/10/2014 0805   VLDL 25 09/10/2014 0805   LDLCALC 137 (H) 09/10/2018 1024   LABVLDL 30  09/10/2018 1024     Assessment & Plan:   1. Type 2 diabetes mellitus with hyperglycemia, without long-term current use of insulin (HCC)  - Laura Scott has currently uncontrolled symptomatic type 2 DM since  59 years of age. She presents with a meter showing consistently on target readings, and A1c of 5.5%, overall improving from 12.1%.    Recent labs reviewed. - I had a long discussion with her about the progressive nature of diabetes and the pathology behind its complications. -She does not report any gross complications from her diabetes, however, she remains at a high risk for more acute and chronic complications which include CAD, CVA, CKD, retinopathy, and neuropathy. These are all discussed in detail with her.  - I have counseled her on diet  and weight management  by adopting a carbohydrate restricted/protein rich diet. Patient is encouraged to switch to  unprocessed or minimally processed     complex starch and increased protein intake (animal or plant source), fruits, and vegetables. -  she is advised to stick to a routine mealtimes to eat 3 meals  a day and avoid unnecessary snacks ( to snack only to correct hypoglycemia).   - she  admits there is a room for improvement in her diet and drink choices. -  Suggestion is made for her to avoid simple carbohydrates  from her diet including Cakes, Sweet Desserts / Pastries, Ice  Cream, Soda (diet and regular), Sweet Tea, Candies, Chips, Cookies, Sweet Pastries,  Store Bought Juices, Alcohol in Excess of  1-2 drinks a day, Artificial Sweeteners, Coffee Creamer, and "Sugar-free" Products. This will help patient to have stable blood glucose profile and potentially avoid unintended weight gain.   - she will be scheduled with Jearld Fenton, RDN, CDE for diabetes education.  - I have approached her with the following individualized plan to manage  her diabetes and patient agrees:   -Based on her recent treatment response, she will not need  insulin treatment for now. -She has tolerated Metformin.  She is advised to continue Metformin 500 mg XR p.o. daily after breakfast.  At this time she will be taken off of glipizide to avoid inadvertent hypoglycemia.    - she will be considered for incretin therapy as appropriate next visit.  - Specific targets for  A1c;  LDL, HDL,  and Triglycerides were discussed with the patient.  2) Blood Pressure /Hypertension:  Her blood pressure is controlled to target. she is advised to continue her current medications including HCTZ 25 mg p.o. daily with breakfast .  She is also on losartan 50 mg p.o. daily. 3) Lipids/Hyperlipidemia:   Review of her recent lipid panel showed un controlled  LDL at 137 .  she  is advised to continue atorvastatin 20 mg p.o. daily at bedtime. reports she did not tolerate 40 mg of atorvastatin.  side effects and precautions discussed with her.  4)  Weight/Diet:  Body mass index is 28.54 kg/m.  -   she is  a candidate for modest weight loss. I discussed with her the fact that loss of 5 - 10% of her  current body weight will have the most impact on her diabetes management.  Exercise, and detailed carbohydrates information provided  -  detailed on discharge instructions.  5) Chronic Care/Health Maintenance:  -she  is on ARB and statin medications and  is encouraged to initiate and continue to follow up with Ophthalmology, Dentist,  Podiatrist at least yearly or according to recommendations, and advised to   stay away from smoking. I have recommended yearly flu vaccine and pneumonia vaccine at least every 5 years; moderate intensity exercise for up to 150 minutes weekly; and  sleep for at least 7 hours a day.  - she is  advised to maintain close follow up with Mikey Kirschner, MD for primary care needs, as well as her other providers for optimal and coordinated care.  - Time spent on this patient care encounter:  35 min, of which > 50% was spent in  counseling and the rest  reviewing her blood glucose logs , discussing her hypoglycemia and hyperglycemia episodes, reviewing her current and  previous labs / studies  ( including abstraction from other facilities) and medications  doses and developing a  long term treatment plan and documenting her care.   Please refer to Patient Instructions for Blood Glucose Monitoring and Insulin/Medications Dosing Guide"  in media tab for additional information. Please  also refer to " Patient Self Inventory" in the Media  tab for reviewed elements of pertinent patient history.  Laura Scott participated in the discussions, expressed understanding, and voiced agreement with the above plans.  All questions were answered to her satisfaction. she is encouraged to contact clinic should she have any questions or concerns prior to her return visit.    Follow up plan: - Return in about 4 months (around 04/26/2020) for Next Visit  A1c in Office.  Glade Lloyd, MD [facility] [address] [place], [place] [address] Phone: [phone]  Fax: [phone]    12/25/2019, 6:13 PM  This note was partially dictated with voice recognition software. Similar sounding words can be transcribed inadequately or may not  be corrected upon review.

## 2019-12-25 NOTE — Progress Notes (Signed)
Medical Nutrition Therapy:  Appt start time: 1400 end time:  1430.   Assessment:  Primary concerns today: Diabetes Type 2. See Dr. Dorris Fetch.  LIves with her brother. She does the cooking and shopping. Eating 3  meals per day. She has cut out a lot of junk food. A1C better. Down to 5.5%. from 6.4%. Original A1c was 12%  Taking Metformin 500 mg a day and Glipizide 10 mg a day. Saw Dr. Dorris Fetch today. Stopping GLipizide and will just be taking Metformin   Motivated to continue to improve [name] and lose some weight. Lab Results  Component Value Date   HGBA1C 5.5 12/25/2019    Motivated to continue to improve [name] and lose some weight. Wt Readings from Last 3 Encounters:  12/25/19 176 lb 12.8 oz (80.2 kg)  11/13/19 172 lb 12.8 oz (78.4 kg)  09/27/19 175 lb 9.6 oz (79.7 kg)   Ht Readings from Last 3 Encounters:  12/25/19 5\' 6"  (1.676 m)  11/13/19 5' 5.5" (1.664 m)  09/27/19 5\' 6"  (1.676 m)   There is no height or weight on file to calculate BMI. @BMIFA @ Facility age limit for growth percentiles is 20 years. Facility age limit for growth percentiles is 20 years.   Lab Results  Component Value Date   HGBA1C 5.5 12/25/2019   CMP Latest Ref Rng & Units 12/23/2019 09/10/2018 03/15/2017  Glucose 65 - 139 mg/dL 185(H) 163(H) 118(H)  BUN 7 - 25 mg/dL 12 13 13   Creatinine 0.50 - 1.05 mg/dL 1.03 0.94 0.76  Sodium 135 - 146 mmol/L 140 143 137  Potassium 3.5 - 5.3 mmol/L 3.8 4.3 3.8  Chloride 98 - 110 mmol/L 101 100 99(L)  CO2 20 - 32 mmol/L 30 23 30   Calcium 8.6 - 10.4 mg/dL 9.6 9.8 9.0  Total Protein 6.1 - 8.1 g/dL 7.2 7.7 -  Total Bilirubin 0.2 - 1.2 mg/dL 0.8 0.6 -  Alkaline Phos 39 - 117 IU/L - 102 -  AST 10 - 35 U/L 24 27 -  ALT 6 - 29 U/L 25 27 -      Preferred Learning Style:   No preference indicated   Learning Readiness:   Ready  Change in progress   MEDICATIONS:   DIETARY INTAKE:   24-hr recall:  Eating 3 meals per day. Drinking water Eating lots of fresh  fruits and vegetables.   Usual physical activity: walks some or goes to GYM.  Estimated energy needs: 1500 calories 170 g carbohydrates 112 g protein 42 g fat  Progress Towards Goal(s):  In progress.   Nutritional Diagnosis:  NB-1.1 Food and nutrition-related knowledge deficit As related to Diabetes Type 2.  As evidenced by A1C 12%.    Intervention:  Nutrition and Diabetes education provided on My Plate, CHO counting, meal planning, portion sizes, timing of meals, avoiding snacks between meals unless having a low blood sugar, target ranges for A1C and blood sugars, signs/symptoms and treatment of hyper/hypoglycemia, monitoring blood sugars, taking medications as prescribed, benefits of exercising 30 minutes per day and prevention of complications of DM.   Goals  .Keep up the great job!!! Increase walking. Stop eh Glipizide  Teaching Method Utilized:  Visual Auditory Hands on  Handouts given during visit include:  The Plate Method   Meal Plan Card  Diabetes Instructions.   Barriers to learning/adherence to lifestyle change: none  Demonstrated degree of understanding via:  Teach Back   Monitoring/Evaluation:  Dietary intake, exercise, , and body weight in 6  month(s).  Marland Kitchen

## 2019-12-25 NOTE — Patient Instructions (Addendum)
Goals  Keep Walking Keep up the great job!! Stop the Glipizide.

## 2019-12-25 NOTE — Patient Instructions (Signed)
                                     Advice for Weight Management  -For most of us the best way to lose weight is by diet management. Generally speaking, diet management means consuming less calories intentionally which over time brings about progressive weight loss.  This can be achieved more effectively by restricting carbohydrate consumption to the minimum possible.  So, it is critically important to know your numbers: how much calorie you are consuming and how much calorie you need. More importantly, our carbohydrates sources should be unprocessed or minimally processed complex starch food items.   Sometimes, it is important to balance nutrition by increasing protein intake (animal or plant source), fruits, and vegetables.  -Sticking to a routine mealtime to eat 3 meals a day and avoiding unnecessary snacks is shown to have a big role in weight control. Under normal circumstances, the only time we lose real weight is when we are hungry, so allow hunger to take place- hunger means no food between meal times, only water.  It is not advisable to starve.   -It is better to avoid simple carbohydrates including: Cakes, Sweet Desserts, Ice Cream, Soda (diet and regular), Sweet Tea, Candies, Chips, Cookies, Store Bought Juices, Alcohol in Excess of  1-2 drinks a day, Artificial Sweeteners, Doughnuts, Coffee Creamers, "Sugar-free" Products, etc, etc.  This is not a complete list.....    -Consulting with certified diabetes educators is proven to provide you with the most accurate and current information on diet.  Also, you may be  interested in discussing diet options/exchanges , we can schedule a visit with Laura Scott, RDN, CDE for individualized nutrition education.  -Exercise: If you are able: 30 -60 minutes a day ,4 days a week, or 150 minutes a week.  The longer the better.  Combine stretch, strength, and aerobic activities.  If you were told in the past that you  have high risk for cardiovascular diseases, you may seek evaluation by your heart doctor prior to initiating moderate to intense exercise programs.                                  Additional Care Considerations for Diabetes   -Diabetes  is a chronic disease.  The most important care consideration is regular follow-up with your diabetes care provider with the goal being avoiding or delaying its complications and to take advantage of advances in medications and technology.    -Type 2 diabetes is known to coexist with other important comorbidities such as high blood pressure and high cholesterol.  It is critical to control not only the diabetes but also the high blood pressure and high cholesterol to minimize and delay the risk of complications including coronary artery disease, stroke, amputations, blindness, etc.    - Studies showed that people with diabetes will benefit from a class of medications known as ACE inhibitors and statins.  Unless there are specific reasons not to be on these medications, the standard of care is to consider getting one from these groups of medications at an optimal doses.  These medications are generally considered safe and proven to help protect the heart and the kidneys.    - People with diabetes are encouraged to initiate and maintain regular follow-up with eye doctors, foot doctors, dentists ,   and if necessary heart and kidney doctors.     - It is highly recommended that people with diabetes quit smoking or stay away from smoking, and get yearly  flu vaccine and pneumonia vaccine at least every 5 years.  One other important lifestyle recommendation is to ensure adequate sleep - at least 6-7 hours of uninterrupted sleep at night.  -Exercise: If you are able: 30 -60 minutes a day, 4 days a week, or 150 minutes a week.  The longer the better.  Combine stretch, strength, and aerobic activities.  If you were told in the past that you have high risk for cardiovascular  diseases, you may seek evaluation by your heart doctor prior to initiating moderate to intense exercise programs.          

## 2019-12-27 ENCOUNTER — Other Ambulatory Visit: Payer: Self-pay

## 2019-12-27 ENCOUNTER — Encounter: Payer: Self-pay | Admitting: Family Medicine

## 2019-12-27 ENCOUNTER — Ambulatory Visit (INDEPENDENT_AMBULATORY_CARE_PROVIDER_SITE_OTHER): Payer: Medicare Other | Admitting: Family Medicine

## 2019-12-27 VITALS — BP 132/74 | HR 110 | Temp 97.1°F | Ht 66.0 in | Wt 176.8 lb

## 2019-12-27 DIAGNOSIS — H538 Other visual disturbances: Secondary | ICD-10-CM | POA: Diagnosis not present

## 2019-12-27 DIAGNOSIS — H43392 Other vitreous opacities, left eye: Secondary | ICD-10-CM

## 2019-12-27 NOTE — Progress Notes (Signed)
Patient ID: Laura Scott, female    DOB: 03-Apr-1961, 59 y.o.   MRN: [id number]   Chief Complaint  Patient presents with  . Eye Issue    pt here today for referral for having a bubble in left eye. pt states she noticed this about 5 days ago. Pt is diabetic and has floaters but this looks to be like a water bubble that she can see through.    Subjective:    HPI Pt seen for concern of vision changes in left eye.  Has been going on for past 5 days. First few days very bothersome, then last few days has been easier to see.  No vision loss.  Does report seeing things through a "bubble" and that it's floating in her vision.  No pain in eye, discharge, or headache.  Wears glasses, doesn't have them today.  Pt stating h/o floaters, not had diabetic eye exam in the last few yrs.  She also states she was told she has "weak retinas." No complete loss of vision or visual field or curtain covering sensation. No headache, limb weakness, speech changes, dizziness.   Pt DM2 is well controlled -a1c is 5.5, was very uncontrolled in past with a1c at 12.  Taking cholesterol medication as directed and on BP meds and compliant.  Seeing endocrinologist.    Medical History Laura Scott has a past medical history of Allergic rhinitis, Anxiety, Chronic pain, Depression, Diabetes mellitus without complication ([place]), GERD (gastroesophageal reflux disease), Headache, Hyperlipidemia, Hypertension, IBS (irritable bowel syndrome), PMS (premenstrual syndrome), and Reflux.   Outpatient Encounter Medications as of 12/27/2019  Medication Sig  . ACCU-CHEK GUIDE test strip USE TO TEST ONCE DAILY.  Marland Kitchen albuterol (VENTOLIN HFA) 108 (90 Base) MCG/ACT inhaler Inhale 1-2 puffs into the lungs every 6 (six) hours as needed for wheezing or shortness of breath.  Marland Kitchen albuterol (VENTOLIN HFA) 108 (90 Base) MCG/ACT inhaler Inhale 2 puffs into lungs QID prn wheezing  . ALPRAZolam (XANAX) 0.5 MG tablet TAKE (1) TABLET BY MOUTH EVERY SIX HOURS  AS NEEDED. MAY FILL MONTHLY PER MD.  . atorvastatin (LIPITOR) 20 MG tablet Take 20 mg by mouth daily.  . blood glucose meter kit and supplies KIT Dispense based on patient and insurance preference. Tests sugar once a day. DX E11.9  . budesonide-formoterol (SYMBICORT) 160-4.5 MCG/ACT inhaler Inhale 2 puffs into the lungs 2 (two) times daily.  . Cholecalciferol (VITAMIN D) 125 MCG (5000 UT) CAPS Take 5,000 Units by mouth daily with breakfast.  . Cyanocobalamin (VITAMIN B-12) 5000 MCG SUBL Place 1 tablet under the tongue daily.  . cyclobenzaprine (FLEXERIL) 10 MG tablet   . escitalopram (LEXAPRO) 20 MG tablet TAKE ONE TABLET BY MOUTH DAILY.  . fluticasone (FLONASE) 50 MCG/ACT nasal spray Place 2 sprays into both nostrils daily.  Marland Kitchen gabapentin (NEURONTIN) 300 MG capsule TAKE 1 CAPSULE BY MOUTH THREE TIMES A DAY.  . hydrochlorothiazide (HYDRODIURIL) 25 MG tablet TAKE (1) TABLET BY MOUTH ONCE DAILY AS NEEDED.  Marland Kitchen loratadine (CLARITIN) 10 MG tablet Take 10 mg by mouth daily.  Marland Kitchen losartan (COZAAR) 50 MG tablet TAKE 1 TABLET BY MOUTH DAILY.  . metFORMIN (GLUCOPHAGE-XR) 500 MG 24 hr tablet TAKE 1 TABLET IN THE MORNING WITH BREAKFAST.  . Microlet Lancets MISC USE TO TEST BLOOD SUGAR ONCE DAILY.  Marland Kitchen omeprazole (PRILOSEC) 40 MG capsule Take 1 capsule (40 mg total) by mouth daily.  Marland Kitchen zolpidem (AMBIEN) 10 MG tablet TAKE (1) TABLET BY MOUTH AT BEDTIME AS NEEDED.  No facility-administered encounter medications on file as of 12/27/2019.     Review of Systems  Constitutional: Negative for chills and fever.  HENT: Negative for congestion, rhinorrhea and sore throat.   Eyes: Positive for visual disturbance (left eye floater/bubble). Negative for pain, discharge, redness and itching.  Respiratory: Negative for cough, shortness of breath and wheezing.   Cardiovascular: Negative for chest pain and leg swelling.  Gastrointestinal: Negative for abdominal pain, diarrhea, nausea and vomiting.  Genitourinary: Negative  for dysuria and frequency.  Musculoskeletal: Negative for arthralgias and back pain.  Skin: Negative for rash.  Neurological: Negative for dizziness, weakness and headaches.     Vitals BP 132/74   Pulse (!) 110   Temp (!) 97.1 F (36.2 C)   Ht _0  (1.676 m)   Wt 176 lb 12.8 oz (80.2 kg)   LMP 09/03/2013   SpO2 97%   BMI 28.54 kg/m   Objective:   Physical Exam Vitals and nursing note reviewed.  Constitutional:      General: She is not in acute distress.    Appearance: Normal appearance. She is not ill-appearing.  HENT:     Head: Normocephalic and atraumatic.     Nose: Nose normal.  Eyes:     General: Scleral icterus present.        Right eye: No discharge.        Left eye: No discharge.     Extraocular Movements: Extraocular movements intact.     Conjunctiva/sclera: Conjunctivae normal.     Pupils: Pupils are equal, round, and reactive to light.     Comments: Normal fundoscopic exam bilaterally.   Cardiovascular:     Rate and Rhythm: Tachycardia present.  Pulmonary:     Effort: Pulmonary effort is normal. No respiratory distress.  Skin:    General: Skin is warm and dry.     Findings: No rash.  Neurological:     General: No focal deficit present.     Mental Status: She is alert and oriented to person, place, and time.     Cranial Nerves: No cranial nerve deficit.     Sensory: No sensory deficit.     Motor: No weakness.     Gait: Gait normal.  Psychiatric:        Mood and Affect: Mood normal.        Behavior: Behavior normal.      Assessment and Plan   1. Blurry vision, left eye - Ambulatory referral to Ophthalmology  2. Floaters in visual field, left - Ambulatory referral to Ophthalmology   Pt not having any vision loss, no signs or symptoms of stroke. H/o floaters and needing f/u on diabetic eye exam. Pt declining to do snellen testing since not having her glasses today. -urgent consult put in for ophthamology.   -gave precautions to go to ER if  worsening vision changes, complete loss of vision, headache, or loss of visual field.  Reviewed stroke symptoms and to call ems if having those concerns.  F/u prn.  [place], DO 12/27/2019

## 2019-12-30 ENCOUNTER — Other Ambulatory Visit: Payer: Self-pay | Admitting: Family Medicine

## 2019-12-31 ENCOUNTER — Encounter: Payer: Self-pay | Admitting: Family Medicine

## 2019-12-31 DIAGNOSIS — H43812 Vitreous degeneration, left eye: Secondary | ICD-10-CM | POA: Diagnosis not present

## 2019-12-31 DIAGNOSIS — E119 Type 2 diabetes mellitus without complications: Secondary | ICD-10-CM | POA: Diagnosis not present

## 2019-12-31 LAB — HM DIABETES EYE EXAM

## 2020-01-24 ENCOUNTER — Other Ambulatory Visit: Payer: Self-pay | Admitting: Family Medicine

## 2020-03-30 ENCOUNTER — Other Ambulatory Visit: Payer: Self-pay | Admitting: Family Medicine

## 2020-03-30 ENCOUNTER — Other Ambulatory Visit: Payer: Self-pay

## 2020-03-30 DIAGNOSIS — E1165 Type 2 diabetes mellitus with hyperglycemia: Secondary | ICD-10-CM

## 2020-03-30 MED ORDER — ATORVASTATIN CALCIUM 20 MG PO TABS: 20.0000 mg | ORAL_TABLET | Freq: Every day | ORAL | 5 refills | Status: DC

## 2020-03-30 MED ORDER — METFORMIN HCL ER 500 MG PO TB24: ORAL_TABLET | ORAL | 1 refills | Status: DC

## 2020-03-30 MED ORDER — LOSARTAN POTASSIUM 50 MG PO TABS: 50.0000 mg | ORAL_TABLET | Freq: Every day | ORAL | 5 refills | Status: DC

## 2020-03-30 MED ORDER — MICROLET LANCETS MISC: 5 refills | Status: DC

## 2020-03-30 MED ORDER — ESCITALOPRAM OXALATE 20 MG PO TABS: 20.0000 mg | ORAL_TABLET | Freq: Every day | ORAL | 5 refills | Status: DC

## 2020-03-30 NOTE — Addendum Note (Signed)
Addended by: Vicente Males on: 03/30/2020 02:24 PM   Modules accepted: Orders

## 2020-03-30 NOTE — Addendum Note (Signed)
Addended by: Erven Colla on: 03/30/2020 05:23 PM   Modules accepted: Orders

## 2020-03-30 NOTE — Telephone Encounter (Signed)
Laura Scott for [facility] order pharmacy calling to get verbals on pt med. Pt is switching pharmacies. Pt is needing Alprazolam 0.5 mg tablet, lancets, Escitalopram 20 mg, Atorvastatin 20 mg and Losartan 50 mg and Zolpidem 10 mg tablets. Please advise. Thank you  [facility]

## 2020-03-30 NOTE — Telephone Encounter (Signed)
Medications pended. Left message to return call

## 2020-03-30 NOTE — Telephone Encounter (Signed)
Pls let pt know that I don't write for xanax and ambien together.  And I don't do 90 day supplies on these medications.  Needs appt to be seen for these meds.   We can send in her escitalopram, atrovastatin, and [facility]atan [facility].  Pls populate those refills.    Thx.   Dr. Lovena Le

## 2020-03-31 ENCOUNTER — Telehealth: Payer: Self-pay | Admitting: Family Medicine

## 2020-03-31 MED ORDER — LOSARTAN POTASSIUM 50 MG PO TABS: 50.0000 mg | ORAL_TABLET | Freq: Every day | ORAL | 0 refills | Status: DC

## 2020-03-31 NOTE — Addendum Note (Signed)
Addended by: Erven Colla on: 03/31/2020 11:32 AM   Modules accepted: Orders

## 2020-03-31 NOTE — Telephone Encounter (Signed)
Pt is going on vacation and will call to make appt after vacation.  Pt informed that meds were sent to [facility].

## 2020-03-31 NOTE — Telephone Encounter (Signed)
Pt is needing short supply of Losartan 50 mg sent to [place] until she can receive hers from [facility]. Please advise. Thank you

## 2020-04-01 ENCOUNTER — Other Ambulatory Visit: Payer: Self-pay | Admitting: *Deleted

## 2020-04-01 ENCOUNTER — Telehealth: Payer: Self-pay | Admitting: Family Medicine

## 2020-04-01 NOTE — Telephone Encounter (Signed)
error 

## 2020-04-02 NOTE — Progress Notes (Signed)
Patient was already informed of Dr. Tanna Furry note and stated she would call back after her vacation to schedule an appointment.

## 2020-04-02 NOTE — Progress Notes (Signed)
See previous notes.  I sent in some of her requested meds.  Pt needs labs and appt for follow up.  I don't refill ambien and xanax with 90 day supplies and she will need to be seen for those medications. After appt made, I can give small amt till appt.  Thx. Dr. Lovena Le

## 2020-04-06 ENCOUNTER — Other Ambulatory Visit: Payer: Self-pay | Admitting: Family Medicine

## 2020-04-08 ENCOUNTER — Other Ambulatory Visit: Payer: Self-pay | Admitting: "Endocrinology

## 2020-04-08 DIAGNOSIS — E1165 Type 2 diabetes mellitus with hyperglycemia: Secondary | ICD-10-CM

## 2020-04-09 ENCOUNTER — Telehealth: Payer: Self-pay | Admitting: *Deleted

## 2020-04-09 MED ORDER — HYDROCHLOROTHIAZIDE 25 MG PO TABS: ORAL_TABLET | ORAL | 5 refills | Status: DC

## 2020-04-09 MED ORDER — GABAPENTIN 300 MG PO CAPS: ORAL_CAPSULE | ORAL | 0 refills | Status: DC

## 2020-04-09 NOTE — Telephone Encounter (Signed)
Please schedule appt and then can close. Do not need to send back. May want to wait til next week. Pt said on the 11th she would call back after her vacation

## 2020-04-09 NOTE — Telephone Encounter (Signed)
Needs appt for more refills. Thx. Dr. Lovena Le

## 2020-04-13 MED ORDER — ALPRAZOLAM 0.5 MG PO TABS: 0.5000 mg | ORAL_TABLET | Freq: Two times a day (BID) | ORAL | 0 refills | Status: DC | PRN

## 2020-04-13 NOTE — Telephone Encounter (Signed)
Pt scheduled for 8/31. Pt would like refill on xanax states she will stop ambien.

## 2020-04-13 NOTE — Addendum Note (Signed)
Addended by: Erven Colla on: 04/13/2020 05:02 PM   Modules accepted: Orders

## 2020-04-21 ENCOUNTER — Ambulatory Visit (INDEPENDENT_AMBULATORY_CARE_PROVIDER_SITE_OTHER): Payer: Medicare Other | Admitting: Family Medicine

## 2020-04-21 ENCOUNTER — Other Ambulatory Visit: Payer: Self-pay

## 2020-04-21 ENCOUNTER — Encounter: Payer: Self-pay | Admitting: Family Medicine

## 2020-04-21 VITALS — BP 122/74 | HR 102 | Temp 97.1°F | Ht 66.0 in | Wt 176.0 lb

## 2020-04-21 DIAGNOSIS — G4700 Insomnia, unspecified: Secondary | ICD-10-CM | POA: Diagnosis not present

## 2020-04-21 DIAGNOSIS — I10 Essential (primary) hypertension: Secondary | ICD-10-CM | POA: Diagnosis not present

## 2020-04-21 DIAGNOSIS — F32A Depression, unspecified: Secondary | ICD-10-CM

## 2020-04-21 DIAGNOSIS — E782 Mixed hyperlipidemia: Secondary | ICD-10-CM

## 2020-04-21 DIAGNOSIS — F419 Anxiety disorder, unspecified: Secondary | ICD-10-CM

## 2020-04-21 DIAGNOSIS — F329 Major depressive disorder, single episode, unspecified: Secondary | ICD-10-CM

## 2020-04-21 MED ORDER — TIZANIDINE HCL 2 MG PO CAPS: 2.0000 mg | ORAL_CAPSULE | Freq: Three times a day (TID) | ORAL | 0 refills | Status: DC | PRN

## 2020-04-21 MED ORDER — ALPRAZOLAM 0.5 MG PO TABS: 0.5000 mg | ORAL_TABLET | Freq: Two times a day (BID) | ORAL | 0 refills | Status: DC | PRN

## 2020-04-21 NOTE — Progress Notes (Signed)
Patient ID: Laura Scott, female    DOB: 03-17-1961, 59 y.o.   MRN: [id number]   Chief Complaint  Patient presents with  . Hyperlipidemia   Subjective:    HPI  med check up, f/u on HLD, anxiety, and insomnia.  Would like to discuss xanax and ambien. Been on xanax since 2014, and [place] since 2012.  Would like a refill on flexeril. States she has muscle spasm in legs and toes and back.   Sees Dr. Dorris Fetch for diabetes  H/o anxiety- Pt watching nieces feeing stress from this and anxiety and "evil" toward people. Has been taking xanax and has some social anxiety over the past year, since pandemic having hard time getting out socially and needing to take xanax. Used anti-dep in past, tried multiple meds.  Unable to remember all of them. Seen by psychiatrist in past. H/o anxiety in past from brother was in jail and gave her stress.  At that time was started on xanax from her pcp.  Has been on xanax since 2014.   Insomnia- was using ambien 13m qhs. Hasn't tried anything else for sleep.  [place]  Used to take oxycodone for chronic back pain in past. Not on it now.   Medical History [place] a past medical history of Allergic rhinitis, Anxiety, Chronic pain, Depression, Diabetes mellitus without complication ([place], GERD (gastroesophageal reflux disease), Headache, Hyperlipidemia, Hypertension, IBS (irritable bowel syndrome), PMS (premenstrual syndrome), and Reflux.   Outpatient Encounter Medications as of 04/21/2020  Medication Sig  . ACCU-CHEK GUIDE test strip CHECK BLOOD SUGAR ONCE  DAILY  . albuterol (VENTOLIN HFA) 108 (90 Base) MCG/ACT inhaler Inhale 1-2 puffs into the lungs every 6 (six) hours as needed for wheezing or shortness of breath.  . ALPRAZolam (XANAX) 0.5 MG tablet Take 1 tablet (0.5 mg total) by mouth 2 (two) times daily as needed for anxiety.  .Marland Kitchenatorvastatin (LIPITOR) 20 MG tablet Take 1 tablet (20 mg total) by mouth daily.  . blood  glucose meter kit and supplies KIT Dispense based on patient and insurance preference. Tests sugar once a day. DX E11.9  . budesonide-formoterol (SYMBICORT) 160-4.5 MCG/ACT inhaler Inhale 2 puffs into the lungs 2 (two) times daily. (Patient taking differently: Inhale 2 puffs into the lungs 2 (two) times daily. AS NEEDED)  . Cholecalciferol (VITAMIN D) 125 MCG (5000 UT) CAPS Take 5,000 Units by mouth daily with breakfast.  . Cyanocobalamin (VITAMIN B-12) 5000 MCG SUBL Place 1 tablet under the tongue daily.  .Marland Kitchenescitalopram (LEXAPRO) 20 MG tablet Take 1 tablet (20 mg total) by mouth daily.  . fluticasone (FLONASE) 50 MCG/ACT nasal spray Place 2 sprays into both nostrils daily. (Patient taking differently: Place 2 sprays into both nostrils daily. As needed)  . gabapentin (NEURONTIN) 300 MG capsule TAKE 1 CAPSULE BY MOUTH THREE TIMES A DAY. (Patient taking differently: TAKE 1 CAPSULE BY MOUTH THREE TIMES A DAY AS NEEDED)  . hydrochlorothiazide (HYDRODIURIL) 25 MG tablet Take one tablet once daily as needed  . loratadine (CLARITIN) 10 MG tablet Take 10 mg by mouth daily.  .Marland Kitchenlosartan (COZAAR) 50 MG tablet Take 1 tablet (50 mg total) by mouth daily.  . Microlet Lancets MISC USE TO TEST BLOOD SUGAR ONCE DAILY.  .Marland Kitchenomeprazole (PRILOSEC) 40 MG capsule Take 1 capsule (40 mg total) by mouth daily.  . tizanidine (ZANAFLEX) 2 MG capsule Take 1 capsule (2 mg total) by mouth 3 (three) times daily as needed for muscle  spasms.  . [DISCONTINUED] albuterol (VENTOLIN HFA) 108 (90 Base) MCG/ACT inhaler Inhale 2 puffs into lungs QID prn wheezing  . [DISCONTINUED] ALPRAZolam (XANAX) 0.5 MG tablet Take 1 tablet (0.5 mg total) by mouth 2 (two) times daily as needed for anxiety.  . [DISCONTINUED] cyclobenzaprine (FLEXERIL) 10 MG tablet   . [DISCONTINUED] metFORMIN (GLUCOPHAGE-XR) 500 MG 24 hr tablet TAKE 1 TABLET IN THE MORNING WITH BREAKFAST.  . [DISCONTINUED] zolpidem (AMBIEN) 10 MG tablet TAKE (1) TABLET BY MOUTH AT BEDTIME  AS NEEDED.   No facility-administered encounter medications on file as of 04/21/2020.     Review of Systems  Constitutional: Negative for chills and fever.  HENT: Negative for congestion, rhinorrhea and sore throat.   Respiratory: Negative for cough, shortness of breath and wheezing.   Cardiovascular: Negative for chest pain and leg swelling.  Gastrointestinal: Negative for abdominal pain, diarrhea, nausea and vomiting.  Genitourinary: Negative for dysuria and frequency.  Musculoskeletal: Negative for arthralgias and back pain.       +leg/feet cramping at night.  Skin: Negative for rash.  Neurological: Negative for dizziness, weakness and headaches.  Psychiatric/Behavioral: Negative for behavioral problems, confusion, decreased concentration, dysphoric mood, self-injury, sleep disturbance and suicidal ideas. The patient is nervous/anxious. The patient is not hyperactive.      Vitals BP 122/74   Pulse (!) 102   Temp (!) 97.1 F (36.2 C)   Ht _0  (1.676 m)   Wt 176 lb (79.8 kg)   LMP 09/03/2013   SpO2 97%   BMI 28.41 kg/m   Objective:   Physical Exam Vitals and nursing note reviewed.  Constitutional:      General: She is not in acute distress.    Appearance: Normal appearance. She is not ill-appearing.  HENT:     Head: Normocephalic and atraumatic.  Cardiovascular:     Rate and Rhythm: Normal rate and regular rhythm.     Pulses: Normal pulses.     Heart sounds: Normal heart sounds.  Pulmonary:     Effort: Pulmonary effort is normal.     Breath sounds: Normal breath sounds. No wheezing, rhonchi or rales.  Musculoskeletal:        General: Normal range of motion.     Right lower leg: No edema.     Left lower leg: No edema.  Skin:    General: Skin is warm and dry.     Findings: No lesion or rash.  Neurological:     General: No focal deficit present.     Mental Status: She is alert and oriented to person, place, and time.     Cranial Nerves: No cranial nerve  deficit.  Psychiatric:        Mood and Affect: Mood normal.        Behavior: Behavior normal.        Thought Content: Thought content normal.        Judgment: Judgment normal.      Assessment and Plan   1. Mixed hyperlipidemia - Lipid panel - CBC  2. Essential hypertension, benign - Lipid panel - CBC  3. Anxiety and depression - Ambulatory referral to Psychiatry  4. Insomnia, unspecified type - Ambulatory referral to Psychiatry    Needs labs for cbc and lipids. Orders given. Reviewed with pt not to take zanaflex for spasms at same time as xanax. Pt was willing to discontinue the ambien, but feels she needs the xanax. Gave advice on taking melatonin for sleep and sleep hygiene reviewed.  Discussed with pt that I wouldn't continue her prescription of Xanax and ambien, due to safety concerns of combining these medications over long periods of time.  Gave a script of xanax to help bridge pt to her psychiatry appt.  If needing another refill will give a tapering dose. Pt not willing to try alternative for insomnia/anxiety. Pt willing to see psychiatry for anxiety/insomnia/depression.   Advised pt that I don't treat insomnia with long term benzodiazepines due to safety concerns of long term use can lead to early memory loss, dementia or increased risk of falls.  Pt voiced understanding.  F/u [place] prn.

## 2020-04-22 LAB — CBC
Hematocrit: 35.7 % (ref 34.0–46.6)
Hemoglobin: 12.8 g/dL (ref 11.1–15.9)
MCH: 31.7 pg (ref 26.6–33.0)
MCHC: 35.9 g/dL — ABNORMAL HIGH (ref 31.5–35.7)
MCV: 88 fL (ref 79–97)
Platelets: 347 10*3/uL (ref 150–450)
RBC: 4.04 x10E6/uL (ref 3.77–5.28)
RDW: 11.9 % (ref 11.7–15.4)
WBC: 6.3 10*3/uL (ref 3.4–10.8)

## 2020-04-22 LAB — LIPID PANEL
Chol/HDL Ratio: 2.6 ratio (ref 0.0–4.4)
Cholesterol, Total: 113 mg/dL (ref 100–199)
HDL: 43 mg/dL (ref >39)
LDL Chol Calc (NIH): 53 mg/dL (ref 0–99)
Triglycerides: 84 mg/dL (ref 0–149)
VLDL Cholesterol Cal: 17 mg/dL (ref 5–40)

## 2020-04-29 ENCOUNTER — Other Ambulatory Visit: Payer: Self-pay

## 2020-04-29 ENCOUNTER — Telehealth (INDEPENDENT_AMBULATORY_CARE_PROVIDER_SITE_OTHER): Payer: Medicare Other | Admitting: "Endocrinology

## 2020-04-29 ENCOUNTER — Encounter: Payer: Self-pay | Admitting: "Endocrinology

## 2020-04-29 ENCOUNTER — Encounter: Payer: Medicare Other | Attending: Family Medicine | Admitting: Nutrition

## 2020-04-29 VITALS — Ht 66.0 in | Wt 176.0 lb

## 2020-04-29 VITALS — Ht 66.0 in | Wt 178.0 lb

## 2020-04-29 DIAGNOSIS — I10 Essential (primary) hypertension: Secondary | ICD-10-CM | POA: Diagnosis not present

## 2020-04-29 DIAGNOSIS — E1165 Type 2 diabetes mellitus with hyperglycemia: Secondary | ICD-10-CM

## 2020-04-29 DIAGNOSIS — E782 Mixed hyperlipidemia: Secondary | ICD-10-CM

## 2020-04-29 MED ORDER — METFORMIN HCL ER 500 MG PO TB24: 500.0000 mg | ORAL_TABLET | Freq: Every day | ORAL | 1 refills | Status: DC

## 2020-04-29 NOTE — Progress Notes (Signed)
  Medical Nutrition Therapy:  Appt start time: 1400 end time:  1430.   Assessment:  Primary concerns today: Diabetes Type 2. See Dr. Dorris Fetch.  BP 122/74 mg/dl.  Has been seeing a new MD and is coming off xanax. FBS 110-135 mg/dl. Hasn't been able to exercise as much as she wants. Willing to start Working on eating better foods and eating meals on time. Taking Metformin 500 mg a day Drinking more water Will get A1C done next visit.  Motivated to continue to improve [name] and lose some weight. Lab Results  Component Value Date   HGBA1C 5.5 12/25/2019    Motivated to continue to improve [name] and lose some weight. Wt Readings from Last 3 Encounters:  04/29/20 178 lb (80.7 kg)  04/21/20 176 lb (79.8 kg)  12/27/19 176 lb 12.8 oz (80.2 kg)   Ht Readings from Last 3 Encounters:  04/29/20 5\' 6"  (1.676 m)  04/21/20 5\' 6"  (1.676 m)  12/27/19 5\' 6"  (1.676 m)   There is no height or weight on file to calculate BMI. @BMIFA @ Facility age limit for growth percentiles is 20 years. Facility age limit for growth percentiles is 20 years.   Lab Results  Component Value Date   HGBA1C 5.5 12/25/2019   CMP Latest Ref Rng & Units 12/23/2019 09/10/2018 03/15/2017  Glucose 65 - 139 mg/dL 185(H) 163(H) 118(H)  BUN 7 - 25 mg/dL 12 13 13   Creatinine 0.50 - 1.05 mg/dL 1.03 0.94 0.76  Sodium 135 - 146 mmol/L 140 143 137  Potassium 3.5 - 5.3 mmol/L 3.8 4.3 3.8  Chloride 98 - 110 mmol/L 101 100 99(L)  CO2 20 - 32 mmol/L 30 23 30   Calcium 8.6 - 10.4 mg/dL 9.6 9.8 9.0  Total Protein 6.1 - 8.1 g/dL 7.2 7.7 -  Total Bilirubin 0.2 - 1.2 mg/dL 0.8 0.6 -  Alkaline Phos 39 - 117 IU/L - 102 -  AST 10 - 35 U/L 24 27 -  ALT 6 - 29 U/L 25 27 -      Preferred Learning Style:   No preference indicated   Learning Readiness:   Ready  Change in progress   MEDICATIONS:   DIETARY INTAKE:   24-hr recall:  B)Pork n beans L) scambled eggs, 1 slice toast, water  D) steak and toss salad,  water    Usual physical activity: walks some or goes to GYM.  Estimated energy needs: 1500 calories 170 g carbohydrates 112 g protein 42 g fat  Progress Towards Goal(s):  In progress.   Nutritional Diagnosis:  NB-1.1 Food and nutrition-related knowledge deficit As related to Diabetes Type 2.  As evidenced by A1C 12%.    Intervention:  Nutrition and Diabetes education provided on My Plate, CHO counting, meal planning, portion sizes, timing of meals, avoiding snacks between meals unless having a low blood sugar, target ranges for A1C and blood sugars, signs/symptoms and treatment of hyper/hypoglycemia, monitoring blood sugars, taking medications as prescribed, benefits of exercising 30 minutes per day and prevention of complications of DM.   Goals  .Keep up the great job!!! Increase walking. Stop eh Glipizide  Teaching Method Utilized:  Visual Auditory Hands on  Handouts given during visit include:  The Plate Method   Meal Plan Card  Diabetes Instructions.   Barriers to learning/adherence to lifestyle change: none  Demonstrated degree of understanding via:  Teach Back   Monitoring/Evaluation:  Dietary intake, exercise, , and body weight in 6 month(s).  Marland Kitchen

## 2020-04-29 NOTE — Progress Notes (Signed)
04/29/2020, 9:55 PM                                                     Endocrinology Telehealth Visit Follow up Note -During COVID -19 Pandemic  This visit type was conducted  via telephone due to national recommendations for restrictions regarding the COVID-19 Pandemic  in an effort to limit this patient's exposure and mitigate transmission of the corona virus.  Due to her co-morbid illnesses, Laura Scott is at  moderate to high risk for complications without adequate follow up.  This format is felt to be most appropriate for her at this time.  I connected with this patient on 04/29/2020   by telephone and verified that I am speaking with the correct person using two identifiers. Laura Scott, 10/26/1960. she has verbally consented to this visit. I was in my office and patient was in her residence. No other persons were with me during the encounter. All issues noted in this document were discussed and addressed. The format was not optimal for physical exam.     Subjective:    Patient ID: Laura Scott, female    DOB: 04/09/1961.  Laura Scott is being engaged in telehealth via telephone for follow-up after she was seen in consultation for management of currently uncontrolled symptomatic diabetes requested by  Elvia Collum M, DO.   Past Medical History:  Diagnosis Date  . Allergic rhinitis   . Anxiety   . Chronic pain   . Depression   . Diabetes mellitus without complication ([place])   . GERD (gastroesophageal reflux disease)   . Headache   . Hyperlipidemia   . Hypertension   . IBS (irritable bowel syndrome)   . PMS (premenstrual syndrome)   . Reflux     Past Surgical History:  Procedure Laterality Date  . COLONOSCOPY  08/29/2011   [name]: normal. high risk screening tcs in 5 years.   . COLONOSCOPY WITH PROPOFOL N/A 10/20/2016   Procedure: COLONOSCOPY WITH PROPOFOL;  Surgeon: Daneil Dolin, MD;   [facility];  Service: Endoscopy;  Laterality: N/A;  10:45am  . ESOPHAGOGASTRODUODENOSCOPY (EGD) WITH PROPOFOL N/A 10/20/2016   Procedure: ESOPHAGOGASTRODUODENOSCOPY (EGD) WITH PROPOFOL;  Surgeon: Daneil Dolin, MD;  Location: [facility];  Service: Endoscopy;  Laterality: N/A;  . LUMBAR [place]    . MALONEY DILATION N/A 10/20/2016   Procedure: Venia Minks DILATION;  Surgeon: Daneil Dolin, MD;  Location: [facility];  Service: Endoscopy;  Laterality: N/A;  . POLYPECTOMY  10/20/2016   Procedure: POLYPECTOMY;  Surgeon: Daneil Dolin, MD;  Location: [facility];  Service: Endoscopy;;  cecal and sigmoid polypectomies    Social History   Socioeconomic History  . Marital status: Divorced    Spouse name: Not on file  . Number of children: Not on file  . Years of education: Not on file  . Highest education level: Not on file  Occupational History  . Not on file  Tobacco Use  . Smoking status: Never Smoker  . Smokeless tobacco: Never Used  Substance and Sexual Activity  . Alcohol use: Yes    Comment: occ  . Drug use: No  . Sexual activity: Yes    Birth control/protection: None, Post-menopausal  Other Topics Concern  . Not on file  Social History Narrative  . Not on file   Social Determinants of Health   Financial Resource Strain:   . Difficulty of Paying Living Expenses: Not on file  Food Insecurity:   . Worried About Charity fundraiser in the Last Year: Not on file  . Ran Out of Food in the Last Year: Not on file  Transportation Needs:   . Lack of Transportation (Medical): Not on file  . Lack of Transportation (Non-Medical): Not on file  Physical Activity:   . Days of Exercise per Week: Not on file  . Minutes of Exercise per Session: Not on file  Stress:   . Feeling of Stress : Not on file  Social Connections:   . Frequency of Communication with Friends and Family: Not on file  . Frequency of Social Gatherings with Friends and Family: Not on file  .  Attends Religious Services: Not on file  . Active Member of Clubs or Organizations: Not on file  . Attends Archivist Meetings: Not on file  . Marital Status: Not on file    Family History  Problem Relation Age of Onset  . Hypertension Father   . Diabetes Father   . Hypertension Mother   . Colon polyps Mother   . Hypertension Sister   . Hyperlipidemia Sister   . Colon polyps Brother     Outpatient Encounter Medications as of 04/29/2020  Medication Sig  . ACCU-CHEK GUIDE test strip CHECK BLOOD SUGAR ONCE  DAILY  . albuterol (VENTOLIN HFA) 108 (90 Base) MCG/ACT inhaler Inhale 1-2 puffs into the lungs every 6 (six) hours as needed for wheezing or shortness of breath.  . ALPRAZolam (XANAX) 0.5 MG tablet Take 1 tablet (0.5 mg total) by mouth 2 (two) times daily as needed for anxiety.  Marland Kitchen atorvastatin (LIPITOR) 20 MG tablet Take 1 tablet (20 mg total) by mouth daily.  . blood glucose meter kit and supplies KIT Dispense based on patient and insurance preference. Tests sugar once a day. DX E11.9  . budesonide-formoterol (SYMBICORT) 160-4.5 MCG/ACT inhaler Inhale 2 puffs into the lungs 2 (two) times daily. (Patient taking differently: Inhale 2 puffs into the lungs 2 (two) times daily. AS NEEDED)  . Cholecalciferol (VITAMIN D) 125 MCG (5000 UT) CAPS Take 5,000 Units by mouth daily with breakfast.  . Cyanocobalamin (VITAMIN B-12) 5000 MCG SUBL Place 1 tablet under the tongue daily.  Marland Kitchen escitalopram (LEXAPRO) 20 MG tablet Take 1 tablet (20 mg total) by mouth daily.  . fluticasone (FLONASE) 50 MCG/ACT nasal spray Place 2 sprays into both nostrils daily. (Patient taking differently: Place 2 sprays into both nostrils daily. As needed)  . gabapentin (NEURONTIN) 300 MG capsule TAKE 1 CAPSULE BY MOUTH THREE TIMES A DAY. (Patient taking differently: TAKE 1 CAPSULE BY MOUTH THREE TIMES A DAY AS NEEDED)  . hydrochlorothiazide (HYDRODIURIL) 25 MG tablet Take one tablet once daily as needed  .  loratadine (CLARITIN) 10 MG tablet Take 10 mg by mouth daily.  Marland Kitchen losartan (COZAAR) 50 MG tablet Take 1 tablet (50 mg total) by mouth daily.  . metFORMIN (GLUCOPHAGE-XR) 500 MG 24 hr tablet Take 1 tablet (500 mg  total) by mouth daily with breakfast.  . Microlet Lancets MISC USE TO TEST BLOOD SUGAR ONCE DAILY.  Marland Kitchen omeprazole (PRILOSEC) 40 MG capsule Take 1 capsule (40 mg total) by mouth daily.  . tizanidine (ZANAFLEX) 2 MG capsule Take 1 capsule (2 mg total) by mouth 3 (three) times daily as needed for muscle spasms.  . [DISCONTINUED] albuterol (VENTOLIN HFA) 108 (90 Base) MCG/ACT inhaler Inhale 2 puffs into lungs QID prn wheezing  . [DISCONTINUED] metFORMIN (GLUCOPHAGE-XR) 500 MG 24 hr tablet TAKE 1 TABLET IN THE MORNING WITH BREAKFAST.   No facility-administered encounter medications on file as of 04/29/2020.    ALLERGIES: Allergies  Allergen Reactions  . Sulfa Antibiotics Hives, Diarrhea and Itching  . Other     SURGICAL TAPE Reaction: rash   . Penicillins Hives, Itching and Rash    Has patient had a PCN reaction causing immediate rash, facial/tongue/throat swelling, SOB or lightheadedness with hypotension: Yes Has patient had a PCN reaction causing severe rash involving mucus membranes or skin necrosis: No Has patient had a PCN reaction that required hospitalization: No Has patient had a PCN reaction occurring within the last 10 years: Yes If all of the above answers are "NO", then may proceed with Cephalosporin use.    VACCINATION STATUS: Immunization History  Administered Date(s) Administered  . Influenza,inj,Quad PF,6+ Mos 05/31/2018  . Influenza-Unspecified 05/23/2011  . Moderna SARS-COVID-2 Vaccination 11/02/2019, 12/04/2019  . Pneumococcal Polysaccharide-23 08/20/2018    Diabetes She presents for her follow-up diabetic visit. She has type 2 diabetes mellitus. Onset time: She was diagnosed at approximate age of 37 years. Her disease course has been stable. There are no  hypoglycemic associated symptoms. Pertinent negatives for hypoglycemia include no confusion, headaches, pallor or seizures. There are no diabetic associated symptoms. Pertinent negatives for diabetes include no chest pain, no polydipsia, no polyphagia and no polyuria. There are no hypoglycemic complications. Symptoms are stable. There are no diabetic complications. Risk factors for coronary artery disease include diabetes mellitus, dyslipidemia, family history, hypertension, post-menopausal and sedentary lifestyle. Current diabetic treatment includes oral agent (monotherapy) (She is currently taking glipizide 10 mg p.o. daily.). Her weight is fluctuating minimally. She is following a generally unhealthy diet. When asked about meal planning, she reported none. She has not had a previous visit with a dietitian. She rarely participates in exercise. (She reports blood glucose readings between 135-185.  A1c was 5.5% prior to her last visit overall improved from 12.1%.   ) An ACE inhibitor/angiotensin II receptor blocker is being taken.  Hyperlipidemia This is a chronic problem. The current episode started more than 1 year ago. The problem is uncontrolled. Exacerbating diseases include diabetes. Pertinent negatives include no chest pain, myalgias or shortness of breath. Current antihyperlipidemic treatment includes statins. Risk factors for coronary artery disease include dyslipidemia, diabetes mellitus, hypertension, a sedentary lifestyle and post-menopausal.  Hypertension This is a chronic problem. The current episode started more than 1 year ago. Pertinent negatives include no chest pain, headaches, palpitations or shortness of breath. Risk factors for coronary artery disease include dyslipidemia, diabetes mellitus, family history, sedentary lifestyle and post-menopausal state. Past treatments include diuretics and angiotensin blockers.     Review of Systems  Constitutional: Negative for chills, fever and  unexpected weight change.  HENT: Negative for trouble swallowing and voice change.   Eyes: Negative for visual disturbance.  Respiratory: Negative for cough, shortness of breath and wheezing.   Cardiovascular: Negative for chest pain, palpitations and leg swelling.  Gastrointestinal: Negative for  diarrhea, nausea and vomiting.  Endocrine: Negative for cold intolerance, heat intolerance, polydipsia, polyphagia and polyuria.  Musculoskeletal: Negative for arthralgias and myalgias.  Skin: Negative for color change, pallor, rash and wound.  Neurological: Negative for seizures and headaches.  Psychiatric/Behavioral: Negative for confusion and suicidal ideas.    Objective:    Vitals with BMI 04/29/2020 04/29/2020 04/21/2020  Height '5\' 6"'  '5\' 6"'  '5\' 6"'   Weight 176 lbs 178 lbs 176 lbs  BMI 28.42 17.61 60.73  Systolic - - 710  Diastolic - - 74  Pulse - - 102    Ht '5\' 6"'  (1.676 m)   Wt 178 lb (80.7 kg)   LMP 09/03/2013   BMI 28.73 kg/m   Wt Readings from Last 3 Encounters:  04/29/20 176 lb (79.8 kg)  04/29/20 178 lb (80.7 kg)  04/21/20 176 lb (79.8 kg)     Physical Exam Constitutional:      Appearance: She is well-developed.  HENT:     Head: Normocephalic and atraumatic.  Neck:     Thyroid: No thyromegaly.     Trachea: No tracheal deviation.  Cardiovascular:     Rate and Rhythm: Normal rate and regular rhythm.  Pulmonary:     Effort: Pulmonary effort is normal.  Abdominal:     General: Bowel sounds are normal.     Palpations: Abdomen is soft.     Tenderness: There is no abdominal tenderness. There is no guarding.  Musculoskeletal:        General: Normal range of motion.     Cervical back: Normal range of motion and neck supple.  Skin:    General: Skin is warm and dry.     Coloration: Skin is not pale.     Findings: No erythema or rash.  Neurological:     Mental Status: She is alert and oriented to person, place, and time.     Cranial Nerves: No cranial nerve deficit.      Coordination: Coordination normal.     Deep Tendon Reflexes: Reflexes are normal and symmetric.  Psychiatric:        Judgment: Judgment normal.       CMP     Component Value Date/Time   NA 140 12/23/2019 1036   NA 143 09/10/2018 1024   K 3.8 12/23/2019 1036   CL 101 12/23/2019 1036   CO2 30 12/23/2019 1036   GLUCOSE 185 (H) 12/23/2019 1036   BUN 12 12/23/2019 1036   BUN 13 09/10/2018 1024   CREATININE 1.03 12/23/2019 1036   CALCIUM 9.6 12/23/2019 1036   PROT 7.2 12/23/2019 1036   PROT 7.7 09/10/2018 1024   ALBUMIN 4.7 09/10/2018 1024   AST 24 12/23/2019 1036   ALT 25 12/23/2019 1036   ALKPHOS 102 09/10/2018 1024   BILITOT 0.8 12/23/2019 1036   BILITOT 0.6 09/10/2018 1024   GFRNONAA 60 12/23/2019 1036   GFRAA 69 12/23/2019 1036     Diabetic Labs (most recent): Lab Results  Component Value Date   HGBA1C 5.5 12/25/2019   HGBA1C 6.4 (A) 09/27/2019   HGBA1C 5.0 05/31/2018     Lipid Panel ( most recent) Lipid Panel     Component Value Date/Time   CHOL 113 04/21/2020 1203   TRIG 84 04/21/2020 1203   HDL 43 04/21/2020 1203   CHOLHDL 2.6 04/21/2020 1203   CHOLHDL 3.9 09/10/2014 0805   VLDL 25 09/10/2014 0805   LDLCALC 53 04/21/2020 1203   LABVLDL 17 04/21/2020 1203     Assessment &  Plan:   1. Type 2 diabetes mellitus with hyperglycemia, without long-term current use of insulin (HCC)  - Karlyn Glasco has currently uncontrolled symptomatic type 2 DM since  59 years of age. She reports blood glucose readings between 135-185.  A1c was 5.5% prior to her last visit overall improved from 12.1%.    Recent labs reviewed. - I had a long discussion with her about the progressive nature of diabetes and the pathology behind its complications. -She does not report any gross complications from her diabetes, however, she remains at a high risk for more acute and chronic complications which include CAD, CVA, CKD, retinopathy, and neuropathy. These are all discussed in detail  with her.  - I have counseled her on diet  and weight management  by adopting a carbohydrate restricted/protein rich diet. Patient is encouraged to switch to  unprocessed or minimally processed     complex starch and increased protein intake (animal or plant source), fruits, and vegetables. -  she is advised to stick to a routine mealtimes to eat 3 meals  a day and avoid unnecessary snacks ( to snack only to correct hypoglycemia).    -She remains motivated to keep lifestyle modification.  Suggestion is made for her to avoid simple carbohydrates  from her diet including Cakes, Sweet Desserts / Pastries, Ice Cream, Soda (diet and regular), Sweet Tea, Candies, Chips, Cookies, Sweet Pastries,  Store Bought Juices, Alcohol in Excess of  1-2 drinks a day, Artificial Sweeteners, Coffee Creamer, and "Sugar-free" Products. This will help patient to have stable blood glucose profile and potentially avoid unintended weight gain.    - she has been scheduled with Jearld Fenton, RDN, CDE for diabetes education.  - I have approached her with the following individualized plan to manage  her diabetes and patient agrees:   -Based on her recent treatment response, she will not need insulin treatment for now.  She has tolerated low-dose Metformin.  She is advised to continue continue Metformin 500 mg XR p.o. daily after breakfast.     - she will be considered for incretin therapy as appropriate next visit.  - Specific targets for  A1c;  LDL, HDL,  and Triglycerides were discussed with the patient.  2) Blood Pressure /Hypertension:  she is advised to home monitor blood pressure and report if > 140/90 on 2 separate readings.  she is advised to continue her current medications including HCTZ 25 mg p.o. daily with breakfast .  She is also on losartan 50 mg p.o. daily.  3) Lipids/Hyperlipidemia:   Review of her recent lipid panel showed un controlled  LDL at 137 .  she  is advised to continue atorvastatin 20 mg p.o.  daily at bedtime.   she did not tolerate 40 mg of atorvastatin.  side effects and precautions discussed with her.  4)  Weight/Diet:  Body mass index is 28.73 kg/m.  -   she is  a candidate for modest weight loss. I discussed with her the fact that loss of 5 - 10% of her  current body weight will have the most impact on her diabetes management.  Exercise, and detailed carbohydrates information provided  -  detailed on discharge instructions.  5) Chronic Care/Health Maintenance:  -she  is on ARB and statin medications and  is encouraged to initiate and continue to follow up with Ophthalmology, Dentist,  Podiatrist at least yearly or according to recommendations, and advised to   stay away from smoking. I have recommended yearly  flu vaccine and pneumonia vaccine at least every 5 years; moderate intensity exercise for up to 150 minutes weekly; and  sleep for at least 7 hours a day.  - she is  advised to maintain close follow up with Erven Colla, DO for primary care needs, as well as her other providers for optimal and coordinated care.  - Time spent on this patient care encounter:  35 min, of which > 50% was spent in  counseling and the rest reviewing her blood glucose logs , discussing her hypoglycemia and hyperglycemia episodes, reviewing her current and  previous labs / studies  ( including abstraction from other facilities) and medications  doses and developing a  long term treatment plan and documenting her care.   Please refer to Patient Instructions for Blood Glucose Monitoring and Insulin/Medications Dosing Guide"  in media tab for additional information. Please  also refer to " Patient Self Inventory" in the Media  tab for reviewed elements of pertinent patient history.  Laura Scott participated in the discussions, expressed understanding, and voiced agreement with the above plans.  All questions were answered to her satisfaction. she is encouraged to contact clinic should she have any  questions or concerns prior to her return visit.   Follow up plan: - Return in about 4 months (around 08/29/2020) for NV A1c in Office, NV Office Urine MA.  Glade Lloyd, MD [facility] [address] [place], [place] [address] Phone: [phone]  Fax: [phone]    04/29/2020, 9:55 PM  This note was partially dictated with voice recognition software. Similar sounding words can be transcribed inadequately or may not  be corrected upon review.

## 2020-04-29 NOTE — Patient Instructions (Signed)
Goals  Eat 2 carb choices at each meals Increase vegetables and fruit with meals Consider taking your nephew for walks in stroller for exercise. Keep up the great job!

## 2020-04-29 NOTE — Patient Instructions (Signed)

## 2020-05-21 ENCOUNTER — Other Ambulatory Visit: Payer: Self-pay | Admitting: Family Medicine

## 2020-06-25 ENCOUNTER — Ambulatory Visit (INDEPENDENT_AMBULATORY_CARE_PROVIDER_SITE_OTHER): Payer: Medicare Other | Admitting: Family Medicine

## 2020-06-25 ENCOUNTER — Other Ambulatory Visit: Payer: Self-pay

## 2020-06-25 ENCOUNTER — Encounter: Payer: Self-pay | Admitting: Family Medicine

## 2020-06-25 VITALS — HR 74 | Temp 97.7°F | Resp 16

## 2020-06-25 DIAGNOSIS — B9689 Other specified bacterial agents as the cause of diseases classified elsewhere: Secondary | ICD-10-CM | POA: Diagnosis not present

## 2020-06-25 DIAGNOSIS — R059 Cough, unspecified: Secondary | ICD-10-CM | POA: Diagnosis not present

## 2020-06-25 DIAGNOSIS — J019 Acute sinusitis, unspecified: Secondary | ICD-10-CM | POA: Diagnosis not present

## 2020-06-25 MED ORDER — DOXYCYCLINE HYCLATE 100 MG PO TABS: 100.0000 mg | ORAL_TABLET | Freq: Two times a day (BID) | ORAL | 0 refills | Status: DC

## 2020-06-25 MED ORDER — PREDNISONE 10 MG PO TABS: ORAL_TABLET | ORAL | 0 refills | Status: DC

## 2020-06-25 MED ORDER — BENZONATATE 100 MG PO CAPS: 100.0000 mg | ORAL_CAPSULE | Freq: Two times a day (BID) | ORAL | 0 refills | Status: DC | PRN

## 2020-06-25 NOTE — Progress Notes (Signed)
Patient ID: Laura Scott, female    DOB: 03/17/1961, 59 y.o.   MRN: [id number]   Chief Complaint  Patient presents with  . Cough    congestion and headache for over a week   Subjective:    Presents today for a sick visit, with "cold "symptoms for greater than 9 days.  Reports she has had a fever, does not think she has had one today.  Associated symptoms include headache, cough, sore throat, ear pain, and face pain.  She denies abdominal pain and urinary symptoms.  She says she has tried everything and nothing is working.  Her cough has been difficult to control, has been much worse at night.    Medical History Julieanne has a past medical history of Allergic rhinitis, Anxiety, Chronic pain, Depression, Diabetes mellitus without complication ([place]), GERD (gastroesophageal reflux disease), Headache, Hyperlipidemia, Hypertension, IBS (irritable bowel syndrome), PMS (premenstrual syndrome), and Reflux.   Outpatient Encounter Medications as of 06/25/2020  Medication Sig  . ACCU-CHEK GUIDE test strip CHECK BLOOD SUGAR ONCE  DAILY  . albuterol (VENTOLIN HFA) 108 (90 Base) MCG/ACT inhaler Inhale 1-2 puffs into the lungs every 6 (six) hours as needed for wheezing or shortness of breath.  . ALPRAZolam (XANAX) 0.5 MG tablet Take 1 tablet (0.5 mg total) by mouth 2 (two) times daily as needed for anxiety.  Marland Kitchen atorvastatin (LIPITOR) 20 MG tablet Take 1 tablet (20 mg total) by mouth daily.  . benzonatate (TESSALON) 100 MG capsule Take 1 capsule (100 mg total) by mouth 2 (two) times daily as needed for cough.  . blood glucose meter kit and supplies KIT Dispense based on patient and insurance preference. Tests sugar once a day. DX E11.9  . budesonide-formoterol (SYMBICORT) 160-4.5 MCG/ACT inhaler Inhale 2 puffs into the lungs 2 (two) times daily. (Patient taking differently: Inhale 2 puffs into the lungs 2 (two) times daily. AS NEEDED)  . Cholecalciferol (VITAMIN D) 125 MCG (5000 UT) CAPS Take 5,000 Units  by mouth daily with breakfast.  . Cyanocobalamin (VITAMIN B-12) 5000 MCG SUBL Place 1 tablet under the tongue daily.  Marland Kitchen doxycycline (VIBRA-TABS) 100 MG tablet Take 1 tablet (100 mg total) by mouth 2 (two) times daily.  Marland Kitchen escitalopram (LEXAPRO) 20 MG tablet Take 1 tablet (20 mg total) by mouth daily.  . fluticasone (FLONASE) 50 MCG/ACT nasal spray Place 2 sprays into both nostrils daily. (Patient taking differently: Place 2 sprays into both nostrils daily. As needed)  . gabapentin (NEURONTIN) 300 MG capsule TAKE 1 CAPSULE BY MOUTH THREE TIMES A DAY. (Patient taking differently: TAKE 1 CAPSULE BY MOUTH THREE TIMES A DAY AS NEEDED)  . hydrochlorothiazide (HYDRODIURIL) 25 MG tablet Take one tablet once daily as needed  . loratadine (CLARITIN) 10 MG tablet Take 10 mg by mouth daily.  Marland Kitchen losartan (COZAAR) 50 MG tablet Take 1 tablet (50 mg total) by mouth daily.  . metFORMIN (GLUCOPHAGE-XR) 500 MG 24 hr tablet Take 1 tablet (500 mg total) by mouth daily with breakfast.  . Microlet Lancets MISC USE TO TEST BLOOD SUGAR ONCE DAILY.  Marland Kitchen omeprazole (PRILOSEC) 40 MG capsule Take 1 capsule (40 mg total) by mouth daily.  . predniSONE (DELTASONE) 10 MG tablet Take 3 tablets by mouth for 3 days, then 2 tablets by mouth for 3 days, then one tablet by mouth for 3 days.  Marland Kitchen tiZANidine (ZANAFLEX) 2 MG tablet TAKE ONE TABLET BY MOUTH 3 TIMES A DAY AS NEEDED FOR MUSCLE SPASMS   No facility-administered encounter  medications on file as of 06/25/2020.     Review of Systems  Constitutional: Negative for chills and fever.  HENT: Positive for ear pain, sinus pressure, sinus pain and sore throat.   Respiratory: Positive for cough. Negative for shortness of breath.   Cardiovascular: Negative for chest pain.  Gastrointestinal: Negative for abdominal pain.  Genitourinary: Negative for dysuria.     Vitals Pulse 74   Temp 97.7 F (36.5 C)   Resp 16   LMP 09/03/2013   SpO2 96%   Objective:   Physical Exam Vitals  and nursing note reviewed.  Constitutional:      General: She is not in acute distress.    Appearance: Normal appearance. She is not ill-appearing or toxic-appearing.  HENT:     Right Ear: Tympanic membrane is erythematous.     Left Ear: Tympanic membrane normal.     Nose: Congestion and rhinorrhea present.     Right Turbinates: Not swollen.     Left Turbinates: Swollen.     Right Sinus: Maxillary sinus tenderness and frontal sinus tenderness present.     Left Sinus: Maxillary sinus tenderness and frontal sinus tenderness present.     Mouth/Throat:     Pharynx: Posterior oropharyngeal erythema present.  Cardiovascular:     Rate and Rhythm: Normal rate and regular rhythm.     Heart sounds: Normal heart sounds.  Pulmonary:     Effort: Pulmonary effort is normal.     Breath sounds: Normal breath sounds.     Comments: Unable to control cough during assessment. Present for > 9 days.  Skin:    General: Skin is warm and dry.  Neurological:     Mental Status: She is alert and oriented to person, place, and time.  Psychiatric:        Mood and Affect: Mood normal.        Behavior: Behavior normal.        Thought Content: Thought content normal.        Judgment: Judgment normal.      Assessment and Plan   1. Cough - Novel Coronavirus, NAA ([facility]) - benzonatate (TESSALON) 100 MG capsule; Take 1 capsule (100 mg total) by mouth 2 (two) times daily as needed for cough.  Dispense: 20 capsule; Refill: 0 - predniSONE (DELTASONE) 10 MG tablet; Take 3 tablets by mouth for 3 days, then 2 tablets by mouth for 3 days, then one tablet by mouth for 3 days.  Dispense: 18 tablet; Refill: 0  2. Acute bacterial rhinosinusitis - doxycycline (VIBRA-TABS) 100 MG tablet; Take 1 tablet (100 mg total) by mouth 2 (two) times daily.  Dispense: 20 tablet; Refill: 0   Due to her extreme maxillary and frontal sinus pain and tenderness, will treat for acute bacterial sinusitis.  Her right red tympanic membrane  is also erythematous, and she had painful and tender cervical nodes.   Due to symptoms, we will also test for Covid today.  We will treat with doxycycline due to her penicillin allergy.  She wishes for something to help control her cough.  It was very difficult for her to control her cough during the physical assessment, will treat with prednisone and Tessalon Perles.  Agrees with plan of care discussed today. Understands warning signs to seek further care: Fever, chills, chest pain, shortness of breath, any significant change in health status. Understands to follow-up if symptoms do not resolve.  Will notify once Covid results become available.  Pecolia Ades, FNP-C

## 2020-06-26 LAB — SARS-COV-2, NAA 2 DAY TAT

## 2020-06-26 LAB — NOVEL CORONAVIRUS, NAA: SARS-CoV-2, NAA: NOT DETECTED

## 2020-07-07 ENCOUNTER — Other Ambulatory Visit: Payer: Self-pay | Admitting: Family Medicine

## 2020-07-26 ENCOUNTER — Other Ambulatory Visit: Payer: Self-pay | Admitting: Family Medicine

## 2020-08-04 ENCOUNTER — Telehealth: Payer: Self-pay | Admitting: Family Medicine

## 2020-08-05 NOTE — Telephone Encounter (Signed)
Need f/u in feb to check up on depression/anxiety for more refills.  This requires every 2mo visit if everything is stable. Thx dr. Lovena Le

## 2020-08-05 NOTE — Telephone Encounter (Signed)
Please schedule follow up

## 2020-08-05 NOTE — Telephone Encounter (Signed)
Seen 04/21/20 for check up and note states to follow up in 6 months but last refill was sent in for only one month so not sure if you want to see pt before further refills.

## 2020-08-05 NOTE — Telephone Encounter (Signed)
Please schedule follow up for February

## 2020-08-05 NOTE — Telephone Encounter (Signed)
Sent my chart message to schedule appointment in february

## 2020-08-31 ENCOUNTER — Ambulatory Visit: Payer: Medicare Other | Admitting: "Endocrinology

## 2020-08-31 ENCOUNTER — Encounter: Payer: Self-pay | Admitting: "Endocrinology

## 2020-08-31 ENCOUNTER — Other Ambulatory Visit: Payer: Self-pay

## 2020-08-31 ENCOUNTER — Encounter: Payer: Medicare Other | Attending: Family Medicine | Admitting: Nutrition

## 2020-08-31 ENCOUNTER — Encounter: Payer: Self-pay | Admitting: Nutrition

## 2020-08-31 VITALS — Ht 66.0 in | Wt 173.0 lb

## 2020-08-31 VITALS — BP 157/77 | HR 90 | Ht 66.0 in | Wt 173.4 lb

## 2020-08-31 DIAGNOSIS — E1165 Type 2 diabetes mellitus with hyperglycemia: Secondary | ICD-10-CM | POA: Diagnosis not present

## 2020-08-31 DIAGNOSIS — E782 Mixed hyperlipidemia: Secondary | ICD-10-CM | POA: Diagnosis not present

## 2020-08-31 DIAGNOSIS — I10 Essential (primary) hypertension: Secondary | ICD-10-CM

## 2020-08-31 LAB — POCT UA - MICROALBUMIN
Creatinine, POC: 30 mg/dL
Microalbumin Ur, POC: 80 mg/L

## 2020-08-31 LAB — POCT GLYCOSYLATED HEMOGLOBIN (HGB A1C): HbA1c, POC (controlled diabetic range): 5.9 % (ref 0.0–7.0)

## 2020-08-31 MED ORDER — METFORMIN HCL ER 500 MG PO TB24: 500.0000 mg | ORAL_TABLET | Freq: Every day | ORAL | 1 refills | Status: DC

## 2020-08-31 MED ORDER — LOSARTAN POTASSIUM 50 MG PO TABS
50.0000 mg | ORAL_TABLET | Freq: Every day | ORAL | 1 refills | Status: DC
Start: 2020-08-31 — End: 2021-01-27

## 2020-08-31 NOTE — Progress Notes (Signed)
Medical Nutrition Therapy:  Appt start time: 1500 end time:  7169.   Assessment:  Primary concerns today: Diabetes Type 2. Saw  Dr. Dorris Fetch in office today. Virtual visit with me. This visit was completed via telephone due to the COVID-19 pandemic.   I spoke with Laura Scott  and verified that I was speaking with the correct person with two patient identifiers (full name and date of birth).   I discussed the limitations related to this kind of visit and the patient is willing to proceed. .  A1C 5.9%. Metormin xr 1 time per day. Changes made: has been eating more of the right foods and drinking only water now. Is going back to the [facility] to start working out again. BP today at Dr. Dorris Fetch office was 157/77. She admits she ate some salty food yesterday. Usually uses [facility] [facility]. Getting use to being off her xanax and other medications she was.  Motivated to continue to improve [name] and lose some weight. Lab Results  Component Value Date   HGBA1C 5.9 08/31/2020    Motivated to continue to improve [name] and lose some weight. Wt Readings from Last 3 Encounters:  08/31/20 173 lb 6.4 oz (78.7 kg)  04/29/20 176 lb (79.8 kg)  04/29/20 178 lb (80.7 kg)   Ht Readings from Last 3 Encounters:  08/31/20 5\' 6"  (1.676 m)  04/29/20 5\' 6"  (1.676 m)  04/29/20 5\' 6"  (1.676 m)   There is no height or weight on file to calculate BMI. @BMIFA @ Facility age limit for growth percentiles is 20 years. Facility age limit for growth percentiles is 20 years.   Lab Results  Component Value Date   HGBA1C 5.9 08/31/2020   CMP Latest Ref Rng & Units 12/23/2019 09/10/2018 03/15/2017  Glucose 65 - 139 mg/dL 185(H) 163(H) 118(H)  BUN 7 - 25 mg/dL 12 13 13   Creatinine 0.50 - 1.05 mg/dL 1.03 0.94 0.76  Sodium 135 - 146 mmol/L 140 143 137  Potassium 3.5 - 5.3 mmol/L 3.8 4.3 3.8  Chloride 98 - 110 mmol/L 101 100 99(L)  CO2 20 - 32 mmol/L 30 23 30   Calcium 8.6 - 10.4 mg/dL 9.6 9.8 9.0  Total Protein 6.1 - 8.1 g/dL  7.2 7.7 -  Total Bilirubin 0.2 - 1.2 mg/dL 0.8 0.6 -  Alkaline Phos 39 - 117 IU/L - 102 -  AST 10 - 35 U/L 24 27 -  ALT 6 - 29 U/L 25 27 -      Preferred Learning Style:   No preference indicated   Learning Readiness:   Ready  Change in progress   MEDICATIONS:   DIETARY INTAKE:   24-hr recall:  B Eggs and toast or oatmeal, water L) forgot,  water D) Baked fish, sweet potato and greens, water  Usual physical activity: walks some or goes to G[facility].  Estimated energy needs: 1500 calories 170 g carbohydrates 112 g protein 42 g fat  Progress Towards Goal(s):  In progress.   Nutritional Diagnosis:  NB-1.1 Food and nutrition-related knowledge deficit As related to Diabetes Type 2.  As evidenced by A1C 12%.    Intervention:  Nutrition and Diabetes education provided on My Plate, CHO counting, meal planning, portion sizes, timing of meals, avoiding snacks between meals unless having a low blood sugar, target ranges for A1C and blood sugars, signs/symptoms and treatment of hyper/hypoglycemia, monitoring blood sugars, taking medications as prescribed, benefits of exercising 30 minutes per day and prevention of complications of DM.  Goals set by patient  Increase lower carb vegetables Drink 4 bottles of water per day Cut out  Cured and smoked meats and processed foods to help lower BP Increase fresh fruits and vegetables. Lose 3 lbs by next visit.  Teaching Method Utilized:  Visual Auditory Hands on  Handouts given during visit include:  The Plate Method   Meal Plan Card  Diabetes Instructions.   Barriers to learning/adherence to lifestyle change: none  Demonstrated degree of understanding via:  Teach Back   Monitoring/Evaluation:  Dietary intake, exercise, , and body weight in 6 month(s).  Marland Kitchen

## 2020-08-31 NOTE — Patient Instructions (Signed)

## 2020-08-31 NOTE — Patient Instructions (Addendum)
  Goals set by patient  Increase lower carb vegetables Drink 4 bottles of water per day Cut out  Cured and smoked meats and processed foods to help lower BP Increase fresh fruits and vegetables. Lose 3 lbs by next visit.

## 2020-08-31 NOTE — Progress Notes (Signed)
08/31/2020, 3:07 PM    Endocrinology follow-up note    Subjective:    Patient ID: Laura Scott, female    DOB: 05/01/1961.  Laura Scott is being seen in follow-up after she was seen in consultation for  management of currently uncontrolled symptomatic diabetes requested by  Erven Colla, DO.   Past Medical History:  Diagnosis Date  . Allergic rhinitis   . Anxiety   . Chronic pain   . Depression   . Diabetes mellitus without complication ([place])   . GERD (gastroesophageal reflux disease)   . Headache   . Hyperlipidemia   . Hypertension   . IBS (irritable bowel syndrome)   . PMS (premenstrual syndrome)   . Reflux     Past Surgical History:  Procedure Laterality Date  . COLONOSCOPY  08/29/2011   [name]: normal. high risk screening tcs in 5 years.   . COLONOSCOPY WITH PROPOFOL N/A 10/20/2016   Procedure: COLONOSCOPY WITH PROPOFOL;  Surgeon: Daneil Dolin, MD;  [facility];  Service: Endoscopy;  Laterality: N/A;  10:45am  . ESOPHAGOGASTRODUODENOSCOPY (EGD) WITH PROPOFOL N/A 10/20/2016   Procedure: ESOPHAGOGASTRODUODENOSCOPY (EGD) WITH PROPOFOL;  Surgeon: Daneil Dolin, MD;  [facility];  Service: Endoscopy;  Laterality: N/A;  . LUMBAR [place]    . MALONEY DILATION N/A 10/20/2016   Procedure: Venia Minks DILATION;  Surgeon: Daneil Dolin, MD;  [facility];  Service: Endoscopy;  Laterality: N/A;  . POLYPECTOMY  10/20/2016   Procedure: POLYPECTOMY;  Surgeon: Daneil Dolin, MD;  [facility];  Service: Endoscopy;;  cecal and sigmoid polypectomies    Social History   Socioeconomic History  . Marital status: Divorced    Spouse name: Not on file  . Number of children: Not on file  . Years of education: Not on file  . Highest education level: Not on file  Occupational History  . Not on file  Tobacco Use  . Smoking status: Never Smoker  .  Smokeless tobacco: Never Used  Substance and Sexual Activity  . Alcohol use: Yes    Comment: occ  . Drug use: No  . Sexual activity: Yes    Birth control/protection: None, Post-menopausal  Other Topics Concern  . Not on file  Social History Narrative  . Not on file   Social Determinants of Health   Financial Resource Strain: Not on file  Food Insecurity: Not on file  Transportation Needs: Not on file  Physical Activity: Not on file  Stress: Not on file  Social Connections: Not on file    Family History  Problem Relation Age of Onset  . Hypertension Father   . Diabetes Father   . Hypertension Mother   . Colon polyps Mother   . Hypertension Sister   . Hyperlipidemia Sister   . Colon polyps Brother     Outpatient Encounter Medications as of 08/31/2020  Medication Sig  . ACCU-CHEK GUIDE test strip CHECK BLOOD SUGAR ONCE  DAILY  . albuterol (VENTOLIN HFA) 108 (90 Base) MCG/ACT inhaler Inhale 1-2 puffs into the lungs every 6 (six) hours as needed for  wheezing or shortness of breath.  Marland Kitchen atorvastatin (LIPITOR) 20 MG tablet TAKE 1 TABLET BY MOUTH  DAILY  . blood glucose meter kit and supplies KIT Dispense based on patient and insurance preference. Tests sugar once a day. DX E11.9  . budesonide-formoterol (SYMBICORT) 160-4.5 MCG/ACT inhaler Inhale 2 puffs into the lungs 2 (two) times daily. (Patient taking differently: Inhale 2 puffs into the lungs 2 (two) times daily. AS NEEDED)  . Cholecalciferol (VITAMIN D) 125 MCG (5000 UT) CAPS Take 5,000 Units by mouth daily with breakfast.  . Cyanocobalamin (VITAMIN B-12) 5000 MCG SUBL Place 1 tablet under the tongue daily.  Marland Kitchen escitalopram (LEXAPRO) 20 MG tablet TAKE 1 TABLET BY MOUTH  DAILY  . fluticasone (FLONASE) 50 MCG/ACT nasal spray Place 2 sprays into both nostrils daily. (Patient taking differently: Place 2 sprays into both nostrils daily. As needed)  . gabapentin (NEURONTIN) 300 MG capsule TAKE 1 CAPSULE BY MOUTH THREE TIMES A DAY.  (Patient taking differently: TAKE 1 CAPSULE BY MOUTH THREE TIMES A DAY AS NEEDED)  . hydrochlorothiazide (HYDRODIURIL) 25 MG tablet Take one tablet once daily as needed  . loratadine (CLARITIN) 10 MG tablet Take 10 mg by mouth daily.  Marland Kitchen losartan (COZAAR) 50 MG tablet Take 1 tablet (50 mg total) by mouth daily.  . metFORMIN (GLUCOPHAGE-XR) 500 MG 24 hr tablet Take 1 tablet (500 mg total) by mouth daily with breakfast.  . Microlet Lancets MISC USE TO TEST BLOOD SUGAR ONCE DAILY.  Marland Kitchen omeprazole (PRILOSEC) 40 MG capsule Take 1 capsule (40 mg total) by mouth daily.  . [DISCONTINUED] ALPRAZolam (XANAX) 0.5 MG tablet Take 1 tablet (0.5 mg total) by mouth 2 (two) times daily as needed for anxiety.  . [DISCONTINUED] benzonatate (TESSALON) 100 MG capsule Take 1 capsule (100 mg total) by mouth 2 (two) times daily as needed for cough.  . [DISCONTINUED] doxycycline (VIBRA-TABS) 100 MG tablet Take 1 tablet (100 mg total) by mouth 2 (two) times daily.  . [DISCONTINUED] losartan (COZAAR) 50 MG tablet TAKE 1 TABLET BY MOUTH  DAILY  . [DISCONTINUED] metFORMIN (GLUCOPHAGE-XR) 500 MG 24 hr tablet Take 1 tablet (500 mg total) by mouth daily with breakfast.  . [DISCONTINUED] predniSONE (DELTASONE) 10 MG tablet Take 3 tablets by mouth for 3 days, then 2 tablets by mouth for 3 days, then one tablet by mouth for 3 days.  . [DISCONTINUED] tiZANidine (ZANAFLEX) 2 MG tablet TAKE ONE TABLET BY MOUTH 3 TIMES A DAY AS NEEDED FOR MUSCLE SPASMS   No facility-administered encounter medications on file as of 08/31/2020.    ALLERGIES: Allergies  Allergen Reactions  . Sulfa Antibiotics Hives, Diarrhea and Itching  . Other     SURGICAL TAPE Reaction: rash   . Penicillins Hives, Itching and Rash    Has patient had a PCN reaction causing immediate rash, facial/tongue/throat swelling, SOB or lightheadedness with hypotension: Yes Has patient had a PCN reaction causing severe rash involving mucus membranes or skin necrosis: No Has  patient had a PCN reaction that required hospitalization: No Has patient had a PCN reaction occurring within the last 10 years: Yes If all of the above answers are "NO", then may proceed with Cephalosporin use.    VACCINATION STATUS: Immunization History  Administered Date(s) Administered  . Influenza,inj,Quad PF,6+ Mos 05/31/2018  . Influenza-Unspecified 05/23/2011  . Moderna Sars-Covid-2 Vaccination 11/02/2019, 12/04/2019  . Pneumococcal Polysaccharide-23 08/20/2018    Diabetes She presents for her follow-up diabetic visit. She has type 2 diabetes mellitus. Onset time:  She was diagnosed at approximate age of 77 years. Her disease course has been stable. There are no hypoglycemic associated symptoms. Pertinent negatives for hypoglycemia include no confusion, headaches, pallor or seizures. There are no diabetic associated symptoms. Pertinent negatives for diabetes include no chest pain, no polydipsia, no polyphagia and no polyuria. There are no hypoglycemic complications. Symptoms are stable. There are no diabetic complications. Risk factors for coronary artery disease include diabetes mellitus, dyslipidemia, family history, hypertension, post-menopausal and sedentary lifestyle. Current diabetic treatment includes oral agent (monotherapy) (She is currently taking glipizide 10 mg p.o. daily.). Her weight is fluctuating minimally. She is following a generally unhealthy diet. When asked about meal planning, she reported none. She has not had a previous visit with a dietitian. She rarely participates in exercise. (She presents without any logs nor meter.  She denies any hypoglycemia.  Her point-of-care A1c is 5.9%, overall improving from 12.1%.   ) An ACE inhibitor/angiotensin II receptor blocker is being taken.  Hyperlipidemia This is a chronic problem. The current episode started more than 1 year ago. The problem is uncontrolled. Exacerbating diseases include diabetes. Pertinent negatives include no  chest pain, myalgias or shortness of breath. Current antihyperlipidemic treatment includes statins. Risk factors for coronary artery disease include dyslipidemia, diabetes mellitus, hypertension, a sedentary lifestyle and post-menopausal.  Hypertension This is a chronic problem. The current episode started more than 1 year ago. Pertinent negatives include no chest pain, headaches, palpitations or shortness of breath. Risk factors for coronary artery disease include dyslipidemia, diabetes mellitus, family history, sedentary lifestyle and post-menopausal state. Past treatments include diuretics and angiotensin blockers.     Review of Systems  Constitutional: Negative for chills, fever and unexpected weight change.  HENT: Negative for trouble swallowing and voice change.   Eyes: Negative for visual disturbance.  Respiratory: Negative for cough, shortness of breath and wheezing.   Cardiovascular: Negative for chest pain, palpitations and leg swelling.  Gastrointestinal: Negative for diarrhea, nausea and vomiting.  Endocrine: Negative for cold intolerance, heat intolerance, polydipsia, polyphagia and polyuria.  Musculoskeletal: Negative for arthralgias and myalgias.  Skin: Negative for color change, pallor, rash and wound.  Neurological: Negative for seizures and headaches.  Psychiatric/Behavioral: Negative for confusion and suicidal ideas.    Objective:    Vitals with BMI 08/31/2020 06/25/2020 04/29/2020  Height '5\' 6"'  - '5\' 6"'   Weight 173 lbs 6 oz - 176 lbs  BMI 28 - 34.37  Systolic 357 - -  Diastolic 77 - -  Pulse 90 74 -    BP (!) 157/77   Pulse 90   Ht '5\' 6"'  (1.676 m)   Wt 173 lb 6.4 oz (78.7 kg)   LMP 09/03/2013   BMI 27.99 kg/m   Wt Readings from Last 3 Encounters:  08/31/20 173 lb 6.4 oz (78.7 kg)  04/29/20 176 lb (79.8 kg)  04/29/20 178 lb (80.7 kg)     Physical Exam Constitutional:      Appearance: She is well-developed.  HENT:     Head: Normocephalic and atraumatic.   Neck:     Thyroid: No thyromegaly.     Trachea: No tracheal deviation.  Cardiovascular:     Rate and Rhythm: Normal rate and regular rhythm.  Pulmonary:     Effort: Pulmonary effort is normal.  Abdominal:     General: Bowel sounds are normal.     Palpations: Abdomen is soft.     Tenderness: There is no abdominal tenderness. There is no guarding.  Musculoskeletal:  General: Normal range of motion.     Cervical back: Normal range of motion and neck supple.  Skin:    General: Skin is warm and dry.     Coloration: Skin is not pale.     Findings: No erythema or rash.  Neurological:     Mental Status: She is alert and oriented to person, place, and time.     Cranial Nerves: No cranial nerve deficit.     Coordination: Coordination normal.     Deep Tendon Reflexes: Reflexes are normal and symmetric.  Psychiatric:        Judgment: Judgment normal.       CMP     Component Value Date/Time   NA 140 12/23/2019 1036   NA 143 09/10/2018 1024   K 3.8 12/23/2019 1036   CL 101 12/23/2019 1036   CO2 30 12/23/2019 1036   GLUCOSE 185 (H) 12/23/2019 1036   BUN 12 12/23/2019 1036   BUN 13 09/10/2018 1024   CREATININE 1.03 12/23/2019 1036   CALCIUM 9.6 12/23/2019 1036   PROT 7.2 12/23/2019 1036   PROT 7.7 09/10/2018 1024   ALBUMIN 4.7 09/10/2018 1024   AST 24 12/23/2019 1036   ALT 25 12/23/2019 1036   ALKPHOS 102 09/10/2018 1024   BILITOT 0.8 12/23/2019 1036   BILITOT 0.6 09/10/2018 1024   GFRNONAA 60 12/23/2019 1036   GFRAA 69 12/23/2019 1036     Diabetic Labs (most recent): Lab Results  Component Value Date   HGBA1C 5.9 08/31/2020   HGBA1C 5.5 12/25/2019   HGBA1C 6.4 (A) 09/27/2019     Lipid Panel ( most recent) Lipid Panel     Component Value Date/Time   CHOL 113 04/21/2020 1203   TRIG 84 04/21/2020 1203   HDL 43 04/21/2020 1203   CHOLHDL 2.6 04/21/2020 1203   CHOLHDL 3.9 09/10/2014 0805   VLDL 25 09/10/2014 0805   LDLCALC 53 04/21/2020 1203   LABVLDL 17  04/21/2020 1203     Assessment & Plan:   1. Type 2 diabetes mellitus with hyperglycemia, without long-term current use of insulin (HCC)  - Laura Scott has currently uncontrolled symptomatic type 2 DM since  60 years of age. She presents without any logs nor meter.  She denies any hypoglycemia.  Her point-of-care A1c is 5.9%, overall improving from 12.1%.   Recent labs reviewed. - I had a long discussion with her about the progressive nature of diabetes and the pathology behind its complications. -She does not report any gross complications from her diabetes, however, she remains at a high risk for more acute and chronic complications which include CAD, CVA, CKD, retinopathy, and neuropathy. These are all discussed in detail with her.  - I have counseled her on diet  and weight management  by adopting a carbohydrate restricted/protein rich diet. Patient is encouraged to switch to  unprocessed or minimally processed     complex starch and increased protein intake (animal or plant source), fruits, and vegetables. -  she is advised to stick to a routine mealtimes to eat 3 meals  a day and avoid unnecessary snacks ( to snack only to correct hypoglycemia).   -She remains motivated to keep lifestyle modification.  - Suggestion is made for her to avoid simple carbohydrates  from her diet including Cakes, Sweet Desserts, Ice Cream, Soda (diet and regular), Sweet Tea, Candies, Chips, Cookies, Store Bought Juices, Alcohol in Excess of  1-2 drinks a day, Artificial Sweeteners,  Coffee Creamer, and "Sugar-free" Products,  Lemonade. This will help patient to have more stable blood glucose profile and potentially avoid unintended weight gain.    - she has been scheduled with Jearld Fenton, RDN, CDE for diabetes education.  - I have approached her with the following individualized plan to manage  her diabetes and patient agrees:   -In light of her presentation with controlled glycemia with A1c of 5.9%,  she will not need insulin treatment for now.  She has tolerated Metformin, advised to continue Metformin 500 mg XR p.o. daily after breakfast.     - she will be considered for incretin therapy as appropriate next visit.  - Specific targets for  A1c;  LDL, HDL,  and Triglycerides were discussed with the patient.  2) Blood Pressure /Hypertension:  Her blood pressure is not controlled to target.  she is advised to continue her current medications including HCTZ 25 mg p.o. daily with breakfast .  She is also on losartan 50 mg p.o. daily.  3) Lipids/Hyperlipidemia:   Review of her recent lipid panel showed un controlled  LDL at 137 .  she  is advised to continue atorvastatin 20 mg p.o. daily at bedtime.   side effects and precautions discussed with her.  4)  Weight/Diet:  Body mass index is 27.99 kg/m.  -   she is  a candidate for modest weight loss. I discussed with her the fact that loss of 5 - 10% of her  current body weight will have the most impact on her diabetes management.  Exercise, and detailed carbohydrates information provided  -  detailed on discharge instructions.  5) Chronic Care/Health Maintenance:  -she  is on ARB and statin medications and  is encouraged to initiate and continue to follow up with Ophthalmology, Dentist,  Podiatrist at least yearly or according to recommendations, and advised to   stay away from smoking. I have recommended yearly flu vaccine and pneumonia vaccine at least every 5 years; moderate intensity exercise for up to 150 minutes weekly; and  sleep for at least 7 hours a day.  Her ABI was normal, she has mild microalbuminuria.     POCT ABI Results 08/31/20  Her ABI is normal today. Right ABI: 1.06      left ABI: 1.06  Right leg systolic / diastolic: 301/60 mmHg Left leg systolic / diastolic: 109/32 mmHg  Arm systolic / diastolic: 355/73 mmHG  - she is  advised to maintain close follow up with Erven Colla, DO for primary care needs, as well as her  other providers for optimal and coordinated care. - Time spent on this patient care encounter:  35 min, of which > 50% was spent in  counseling and the rest reviewing her blood glucose logs , discussing her hypoglycemia and hyperglycemia episodes, reviewing her current and  previous labs / studies  ( including abstraction from other facilities) and medications  doses and developing a  long term treatment plan and documenting her care.   Please refer to Patient Instructions for Blood Glucose Monitoring and Insulin/Medications Dosing Guide"  in media tab for additional information. Please  also refer to " Patient Self Inventory" in the Media  tab for reviewed elements of pertinent patient history.  Laura Scott participated in the discussions, expressed understanding, and voiced agreement with the above plans.  All questions were answered to her satisfaction. she is encouraged to contact clinic should she have any questions or concerns prior to her return visit.   Follow up plan: -  Return in about 6 months (around 02/28/2021) for F/U with Pre-visit Labs, A1c -NV.  Glade Lloyd, MD [facility] [address] [place], [place] [address] Phone: [phone]  Fax: [phone]    08/31/2020, 3:07 PM  This note was partially dictated with voice recognition software. Similar sounding words can be transcribed inadequately or may not  be corrected upon review.

## 2020-09-03 ENCOUNTER — Other Ambulatory Visit: Payer: Self-pay | Admitting: Family Medicine

## 2020-09-08 ENCOUNTER — Other Ambulatory Visit: Payer: Self-pay | Admitting: Family Medicine

## 2020-09-28 ENCOUNTER — Other Ambulatory Visit: Payer: Self-pay | Admitting: Family Medicine

## 2020-10-01 ENCOUNTER — Other Ambulatory Visit: Payer: Self-pay | Admitting: Family Medicine

## 2020-10-15 ENCOUNTER — Other Ambulatory Visit: Payer: Self-pay | Admitting: Family Medicine

## 2020-10-16 NOTE — Telephone Encounter (Signed)
Needs visit and then route back to nurses to send in refill

## 2020-10-16 NOTE — Telephone Encounter (Signed)
Sent my chart message to schedule appointment.

## 2020-10-26 NOTE — Telephone Encounter (Signed)
Scheduled appointment for 3/22 for medication followup

## 2020-11-10 ENCOUNTER — Ambulatory Visit: Payer: Medicare Other | Admitting: Family Medicine

## 2020-11-17 ENCOUNTER — Encounter: Payer: Self-pay | Admitting: Family Medicine

## 2020-11-17 ENCOUNTER — Ambulatory Visit (INDEPENDENT_AMBULATORY_CARE_PROVIDER_SITE_OTHER): Payer: Medicare Other | Admitting: Family Medicine

## 2020-11-17 ENCOUNTER — Other Ambulatory Visit: Payer: Self-pay

## 2020-11-17 VITALS — BP 138/80 | HR 72 | Temp 97.2°F | Wt 177.0 lb

## 2020-11-17 DIAGNOSIS — F419 Anxiety disorder, unspecified: Secondary | ICD-10-CM | POA: Diagnosis not present

## 2020-11-17 DIAGNOSIS — Z9109 Other allergy status, other than to drugs and biological substances: Secondary | ICD-10-CM | POA: Diagnosis not present

## 2020-11-17 DIAGNOSIS — E1165 Type 2 diabetes mellitus with hyperglycemia: Secondary | ICD-10-CM | POA: Diagnosis not present

## 2020-11-17 MED ORDER — ATORVASTATIN CALCIUM 20 MG PO TABS: 20.0000 mg | ORAL_TABLET | Freq: Every day | ORAL | 1 refills | Status: DC

## 2020-11-17 MED ORDER — ESCITALOPRAM OXALATE 20 MG PO TABS: ORAL_TABLET | ORAL | 1 refills | Status: DC

## 2020-11-17 MED ORDER — BUSPIRONE HCL 5 MG PO TABS: 5.0000 mg | ORAL_TABLET | Freq: Three times a day (TID) | ORAL | 0 refills | Status: DC

## 2020-11-17 MED ORDER — HYDROCHLOROTHIAZIDE 25 MG PO TABS: ORAL_TABLET | ORAL | 0 refills | Status: DC

## 2020-11-17 NOTE — Patient Instructions (Signed)
  For [place]:   [facility]:  [address] [place]; [place]  [phone] fax # [phone]   [facility]:  [address]  [phone]   [address]  [phone]  fax # [phone]   [place]  [phone]  [phone]   [place] [phone]   For [place] area:  [place]  [place]  [phone]   [place]  [place] [address]  [place]   [phone]   [facility] at [facility], [address] [place], [place], [place] [address]  [phone]   [facility]  [address] [place]  [place], [place] [address]  [phone]   [facility]  [address] [place]  [place], [place] [address]

## 2020-11-17 NOTE — Progress Notes (Signed)
Patient ID: Laura Scott, female    DOB: 06/23/1961, 60 y.o.   MRN: [id number]   Chief Complaint  Patient presents with  . Anxiety  . Cough  . Hypertension   Subjective:    HPI  F/u -anxiety, coughing, htn- med check up. Needs refill on all meds.   Would like to discuss anxiety attacks. Pt states she never heard back about referral to psych. Note in referral states canceled bc no provider available for adults. Pt used to take xanax for anxiety. Pt was tapered off and given referral to psychiatry.  Not leaving the house.  Not wanting to go around people.  If there is event, pt gets "sick" and can't go. As she's aging feels getting worse and since pandemic. Feeling might have some social anxiety. Pt wondered it was like this for a long time.  Back pain with sciatica on rt- Leg pain- on rt.  She is using Gabapentin prn.  Dr. Dorris Fetch for diabetes, seeing them in 4 months. Had 5.9 a1c in 1/22.  Allergies in past with  No fever, mild pressure in face.    Medical History Laura Scott has a past medical history of Allergic rhinitis, Anxiety, Chronic pain, Depression, Diabetes mellitus without complication ([place]), GERD (gastroesophageal reflux disease), Headache, Hyperlipidemia, Hypertension, IBS (irritable bowel syndrome), PMS (premenstrual syndrome), and Reflux.   Outpatient Encounter Medications as of 11/17/2020  Medication Sig  . ACCU-CHEK GUIDE test strip CHECK BLOOD SUGAR ONCE  DAILY  . albuterol (VENTOLIN HFA) 108 (90 Base) MCG/ACT inhaler Inhale 1-2 puffs into the lungs every 6 (six) hours as needed for wheezing or shortness of breath.  . blood glucose meter kit and supplies KIT Dispense based on patient and insurance preference. Tests sugar once a day. DX E11.9  . budesonide-formoterol (SYMBICORT) 160-4.5 MCG/ACT inhaler Inhale 2 puffs into the lungs 2 (two) times daily. (Patient taking differently: Inhale 2 puffs into the lungs 2 (two) times daily. AS NEEDED)  . busPIRone  (BUSPAR) 5 MG tablet Take 1 tablet (5 mg total) by mouth 3 (three) times daily. Prn anxiety  . Cholecalciferol (VITAMIN D) 125 MCG (5000 UT) CAPS Take 5,000 Units by mouth daily with breakfast.  . Cyanocobalamin (VITAMIN B-12) 5000 MCG SUBL Place 1 tablet under the tongue daily.  . fluticasone (FLONASE) 50 MCG/ACT nasal spray Place 2 sprays into both nostrils daily. (Patient taking differently: Place 2 sprays into both nostrils daily. As needed)  . gabapentin (NEURONTIN) 300 MG capsule TAKE 1 CAPSULE BY MOUTH THREE TIMES A DAY. (Patient taking differently: TAKE 1 CAPSULE BY MOUTH THREE TIMES A DAY AS NEEDED)  . loratadine (CLARITIN) 10 MG tablet Take 10 mg by mouth daily.  Marland Kitchen losartan (COZAAR) 50 MG tablet Take 1 tablet (50 mg total) by mouth daily.  . metFORMIN (GLUCOPHAGE-XR) 500 MG 24 hr tablet Take 1 tablet (500 mg total) by mouth daily with breakfast.  . Microlet Lancets MISC USE TO TEST BLOOD SUGAR ONCE DAILY.  Marland Kitchen omeprazole (PRILOSEC) 40 MG capsule Take 1 capsule (40 mg total) by mouth daily.  . [DISCONTINUED] atorvastatin (LIPITOR) 20 MG tablet TAKE 1 TABLET BY MOUTH  DAILY  . [DISCONTINUED] escitalopram (LEXAPRO) 20 MG tablet TAKE 1 TABLET BY MOUTH  DAILY NEEDS OFFICE VISIT  . [DISCONTINUED] hydrochlorothiazide (HYDRODIURIL) 25 MG tablet Take one tablet once daily as needed  . atorvastatin (LIPITOR) 20 MG tablet Take 1 tablet (20 mg total) by mouth daily.  Marland Kitchen escitalopram (LEXAPRO) 20 MG tablet TAKE 1 TABLET BY  MOUTH  DAILY NEEDS OFFICE VISIT  . hydrochlorothiazide (HYDRODIURIL) 25 MG tablet Take one tablet once daily as needed   No facility-administered encounter medications on file as of 11/17/2020.     Review of Systems  Constitutional: Negative for chills and fever.  HENT: Negative for congestion, rhinorrhea and sore throat.   Respiratory: Negative for cough, shortness of breath and wheezing.   Cardiovascular: Negative for chest pain and leg swelling.  Gastrointestinal: Negative  for abdominal pain, diarrhea, nausea and vomiting.  Genitourinary: Negative for dysuria and frequency.  Musculoskeletal: Negative for arthralgias and back pain.  Skin: Negative for rash.  Neurological: Negative for dizziness, weakness and headaches.  Psychiatric/Behavioral: Negative for dysphoric mood, self-injury, sleep disturbance and suicidal ideas. The patient is nervous/anxious.      Vitals BP 138/80   Pulse 72   Temp (!) 97.2 F (36.2 C)   Wt 177 lb (80.3 kg)   LMP 09/03/2013   SpO2 99%   BMI 28.57 kg/m   Objective:   Physical Exam Vitals and nursing note reviewed.  Constitutional:      General: She is not in acute distress.    Appearance: Normal appearance. She is not ill-appearing.  HENT:     Head: Normocephalic and atraumatic.     Nose: Nose normal.     Mouth/Throat:     Mouth: Mucous membranes are moist.     Pharynx: Oropharynx is clear.  Eyes:     Extraocular Movements: Extraocular movements intact.     Conjunctiva/sclera: Conjunctivae normal.     Pupils: Pupils are equal, round, and reactive to light.  Cardiovascular:     Rate and Rhythm: Normal rate and regular rhythm.     Pulses: Normal pulses.     Heart sounds: Normal heart sounds.  Pulmonary:     Effort: Pulmonary effort is normal.     Breath sounds: Normal breath sounds. No wheezing, rhonchi or rales.  Musculoskeletal:        General: Normal range of motion.     Right lower leg: No edema.     Left lower leg: No edema.  Skin:    General: Skin is warm and dry.     Findings: No lesion or rash.  Neurological:     General: No focal deficit present.     Mental Status: She is alert and oriented to person, place, and time.  Psychiatric:        Mood and Affect: Mood normal.        Behavior: Behavior normal.        Thought Content: Thought content normal.        Judgment: Judgment normal.    Assessment and Plan   1. Type 2 diabetes mellitus with hyperglycemia, without long-term current use of  insulin (HCC) - Lipid panel - CBC  2. Anxiety - escitalopram (LEXAPRO) 20 MG tablet; TAKE 1 TABLET BY MOUTH  DAILY NEEDS OFFICE VISIT  Dispense: 90 tablet; Refill: 1  3. Environmental allergies   DM2- Pt stating will get labs from Dr. Dorris Fetch office soon.  Gave orders for the lipid and cbc to take with her.  Anxiety- worsening.  Cont with lexapro and gave buspar for anxiety. Gave numbers for counselors and psychiatrist to see about further help for her anxiety.   Allergies- flonase and zyrtec prn.  Return in about 6 months (around 05/20/2021) for f/u anxiety, hld, htn.   11/22/2020

## 2021-01-18 ENCOUNTER — Other Ambulatory Visit: Payer: Self-pay | Admitting: Family Medicine

## 2021-01-26 ENCOUNTER — Other Ambulatory Visit: Payer: Self-pay | Admitting: "Endocrinology

## 2021-02-01 ENCOUNTER — Other Ambulatory Visit: Payer: Self-pay | Admitting: Family Medicine

## 2021-03-01 ENCOUNTER — Other Ambulatory Visit: Payer: Self-pay | Admitting: "Endocrinology

## 2021-03-01 DIAGNOSIS — E782 Mixed hyperlipidemia: Secondary | ICD-10-CM | POA: Diagnosis not present

## 2021-03-01 DIAGNOSIS — E1165 Type 2 diabetes mellitus with hyperglycemia: Secondary | ICD-10-CM

## 2021-03-02 ENCOUNTER — Telehealth: Payer: Self-pay | Admitting: Nurse Practitioner

## 2021-03-02 DIAGNOSIS — E1165 Type 2 diabetes mellitus with hyperglycemia: Secondary | ICD-10-CM

## 2021-03-02 LAB — CBC
Hematocrit: 37.1 % (ref 34.0–46.6)
Hemoglobin: 12.9 g/dL (ref 11.1–15.9)
MCH: 33.5 pg — ABNORMAL HIGH (ref 26.6–33.0)
MCHC: 34.8 g/dL (ref 31.5–35.7)
MCV: 96 fL (ref 79–97)
Platelets: 264 10*3/uL (ref 150–450)
RBC: 3.85 x10E6/uL (ref 3.77–5.28)
RDW: 11.8 % (ref 11.7–15.4)
WBC: 5.9 10*3/uL (ref 3.4–10.8)

## 2021-03-02 LAB — COMPREHENSIVE METABOLIC PANEL
ALT: 53 IU/L — ABNORMAL HIGH (ref 0–32)
AST: 59 IU/L — ABNORMAL HIGH (ref 0–40)
Albumin/Globulin Ratio: 1.7 (ref 1.2–2.2)
Albumin: 4.5 g/dL (ref 3.8–4.9)
Alkaline Phosphatase: 86 IU/L (ref 44–121)
BUN/Creatinine Ratio: 5 — ABNORMAL LOW (ref 9–23)
BUN: 5 mg/dL — ABNORMAL LOW (ref 6–24)
Bilirubin Total: 0.4 mg/dL (ref 0.0–1.2)
CO2: 25 mmol/L (ref 20–29)
Calcium: 9.2 mg/dL (ref 8.7–10.2)
Chloride: 101 mmol/L (ref 96–106)
Creatinine, Ser: 0.93 mg/dL (ref 0.57–1.00)
Globulin, Total: 2.6 g/dL (ref 1.5–4.5)
Glucose: 130 mg/dL — ABNORMAL HIGH (ref 65–99)
Potassium: 4.3 mmol/L (ref 3.5–5.2)
Sodium: 142 mmol/L (ref 134–144)
Total Protein: 7.1 g/dL (ref 6.0–8.5)
eGFR: 71 mL/min/{1.73_m2} (ref >59)

## 2021-03-02 LAB — LIPID PANEL
Chol/HDL Ratio: 3.9 ratio (ref 0.0–4.4)
Chol/HDL Ratio: 4 ratio (ref 0.0–4.4)
Cholesterol, Total: 174 mg/dL (ref 100–199)
Cholesterol, Total: 182 mg/dL (ref 100–199)
HDL: 45 mg/dL (ref >39)
HDL: 46 mg/dL (ref >39)
LDL Chol Calc (NIH): 73 mg/dL (ref 0–99)
LDL Chol Calc (NIH): 79 mg/dL (ref 0–99)
Triglycerides: 351 mg/dL — ABNORMAL HIGH (ref 0–149)
Triglycerides: 356 mg/dL — ABNORMAL HIGH (ref 0–149)
VLDL Cholesterol Cal: 56 mg/dL — ABNORMAL HIGH (ref 5–40)
VLDL Cholesterol Cal: 57 mg/dL — ABNORMAL HIGH (ref 5–40)

## 2021-03-02 NOTE — Telephone Encounter (Signed)
Referral done

## 2021-03-02 NOTE — Telephone Encounter (Signed)
Laura Scott and said a updated referral needs to be placed for [facility]

## 2021-03-03 ENCOUNTER — Encounter: Payer: Self-pay | Admitting: Nurse Practitioner

## 2021-03-03 ENCOUNTER — Other Ambulatory Visit: Payer: Self-pay

## 2021-03-03 ENCOUNTER — Encounter: Payer: Medicare Other | Attending: Family Medicine | Admitting: Nutrition

## 2021-03-03 ENCOUNTER — Encounter: Payer: Self-pay | Admitting: Nutrition

## 2021-03-03 ENCOUNTER — Ambulatory Visit: Payer: Medicare Other | Admitting: Nurse Practitioner

## 2021-03-03 VITALS — BP 137/78 | HR 67 | Ht 66.0 in | Wt 174.0 lb

## 2021-03-03 DIAGNOSIS — E782 Mixed hyperlipidemia: Secondary | ICD-10-CM

## 2021-03-03 DIAGNOSIS — I10 Essential (primary) hypertension: Secondary | ICD-10-CM

## 2021-03-03 DIAGNOSIS — E1165 Type 2 diabetes mellitus with hyperglycemia: Secondary | ICD-10-CM | POA: Diagnosis not present

## 2021-03-03 LAB — POCT GLYCOSYLATED HEMOGLOBIN (HGB A1C): Hemoglobin A1C: 5.5 % (ref 4.0–5.6)

## 2021-03-03 NOTE — Progress Notes (Signed)
Medical Nutrition Therapy:  Appt start time: 1500 end time:  0160. This visit was completed via telephone due to the COVID-19 pandemic.   I spoke with Laura Scott and verified that I was speaking with the correct person with two patient identifiers (full name and date of birth).   I discussed the limitations related to this kind of visit and the patient is willing to proceed.  Assessment:  Primary concerns today: Diabetes Type 2.  Follow up Saw  Dr. Loree Fee in office today. Virtual visit with me. Lost 3 lbs. Working on making better food choices. A1C 5.5, from 5,8%.. Metormin xr 1 time per day. Has had some diarrhea and was taking it before eating breakfast. Will change to taking it after eating breakfast. Changes made: has been eating more of the right foods and drinking only water now. Is going back to the [facility] to start working out again.  Motivated to continue to improve [name] and lose some weight. Lab Results  Component Value Date   HGBA1C 5.5 03/03/2021    Motivated to continue to improve [name] and lose some weight. Wt Readings from Last 3 Encounters:  03/03/21 174 lb (78.9 kg)  11/17/20 177 lb (80.3 kg)  08/31/20 173 lb (78.5 kg)   Ht Readings from Last 3 Encounters:  03/03/21 5\' 6"  (1.676 m)  08/31/20 5\' 6"  (1.676 m)  08/31/20 5\' 6"  (1.676 m)   There is no height or weight on file to calculate BMI. @BMIFA @ Facility age limit for growth percentiles is 20 years. Facility age limit for growth percentiles is 20 years.   Lab Results  Component Value Date   HGBA1C 5.5 03/03/2021   CMP Latest Ref Rng & Units 03/01/2021 12/23/2019 09/10/2018  Glucose 65 - 99 mg/dL 130(H) 185(H) 163(H)  BUN 6 - 24 mg/dL 5(L) 12 13  Creatinine 0.57 - 1.00 mg/dL 0.93 1.03 0.94  Sodium 134 - 144 mmol/L 142 140 143  Potassium 3.5 - 5.2 mmol/L 4.3 3.8 4.3  Chloride 96 - 106 mmol/L 101 101 100  CO2 20 - 29 mmol/L 25 30 23   Calcium 8.7 - 10.2 mg/dL 9.2 9.6 9.8  Total Protein 6.0 - 8.5 g/dL  7.1 7.2 7.7  Total Bilirubin 0.0 - 1.2 mg/dL 0.4 0.8 0.6  Alkaline Phos 44 - 121 IU/L 86 - 102  AST 0 - 40 IU/L 59(H) 24 27  ALT 0 - 32 IU/L 53(H) 25 27      Preferred Learning Style:  No preference indicated   Learning Readiness:  Ready Change in progress   MEDICATIONS:   DIETARY INTAKE:   24-hr recall:  B Eggs and toast or oatmeal, water L) forgot,  water D) Baked fish, sweet potato and greens, water  Usual physical activity: walks some or goes to G[facility].  Estimated energy needs: 1500 calories 170 g carbohydrates 112 g protein 42 g fat  Progress Towards Goal(s):  In progress.   Nutritional Diagnosis:  NB-1.1 Food and nutrition-related knowledge deficit As related to Diabetes Type 2.  As evidenced by A1C 12%.    Intervention:  Nutrition and Diabetes education provided on My Plate, CHO counting, meal planning, portion sizes, timing of meals, avoiding snacks between meals unless having a low blood sugar, target ranges for A1C and blood sugars, signs/symptoms and treatment of hyper/hypoglycemia, monitoring blood sugars, taking medications as prescribed, benefits of exercising 30 minutes per day and prevention of complications of DM.  Goals set by patient Follow Low TG Diet  handout Increase lower carb vegetables Increase high fiber foods Cut out sweets, sodas, and high fat foods to lower triglycerides. Exercise 150 minutes a week.  Teaching Method Utilized:  Visual Auditory Hands on  Handouts given during visit include: The Plate Method  Meal Plan Card Diabetes Instructions.   Barriers to learning/adherence to lifestyle change: none  Demonstrated degree of understanding via:  Teach Back   Monitoring/Evaluation:  Dietary intake, exercise, , and body weight in 6 month(s).  Marland Kitchen

## 2021-03-03 NOTE — Patient Instructions (Signed)
Goals set by patient Follow Low TG Diet handout Increase lower carb vegetables Increase high fiber foods Cut out sweets, sodas, and high fat foods to lower triglycerides. Exercise 150 minutes a week.

## 2021-03-03 NOTE — Progress Notes (Signed)
03/03/2021, 1:56 PM    Endocrinology follow-up note    Subjective:    Patient ID: Laura Scott, female    DOB: 1960/10/25.  Violett Hobbs is being seen in follow-up after she was seen in consultation for  management of currently uncontrolled symptomatic diabetes requested by  Erven Colla, DO.   Past Medical History:  Diagnosis Date   Allergic rhinitis    Anxiety    Chronic pain    Depression    Diabetes mellitus without complication (HCC)    GERD (gastroesophageal reflux disease)    Headache    Hyperlipidemia    Hypertension    IBS (irritable bowel syndrome)    PMS (premenstrual syndrome)    Reflux     Past Surgical History:  Procedure Laterality Date   COLONOSCOPY  08/29/2011   [name]: normal. high risk screening tcs in 5 years.    COLONOSCOPY WITH PROPOFOL N/A 10/20/2016   Procedure: COLONOSCOPY WITH PROPOFOL;  Surgeon: Daneil Dolin, MD;  Location: [facility];  Service: Endoscopy;  Laterality: N/A;  10:45am   ESOPHAGOGASTRODUODENOSCOPY (EGD) WITH PROPOFOL N/A 10/20/2016   Procedure: ESOPHAGOGASTRODUODENOSCOPY (EGD) WITH PROPOFOL;  Surgeon: Daneil Dolin, MD;  Location: [facility];  Service: Endoscopy;  Laterality: N/A;   LUMBAR [place] N/A 10/20/2016   Procedure: Venia Minks DILATION;  Surgeon: Daneil Dolin, MD;  Location: [facility];  Service: Endoscopy;  Laterality: N/A;   POLYPECTOMY  10/20/2016   Procedure: POLYPECTOMY;  Surgeon: Daneil Dolin, MD;  Location: [facility];  Service: Endoscopy;;  cecal and sigmoid polypectomies    Social History   Socioeconomic History   Marital status: Divorced    Spouse name: Not on file   Number of children: Not on file   Years of education: Not on file   Highest education level: Not on file  Occupational History   Not on file  Tobacco Use   Smoking status: Never   Smokeless tobacco: Never  Substance  and Sexual Activity   Alcohol use: Yes    Comment: occ   Drug use: No   Sexual activity: Yes    Birth control/protection: None, Post-menopausal  Other Topics Concern   Not on file  Social History Narrative   Not on file   Social Determinants of Health   Financial Resource Strain: Not on file  Food Insecurity: Not on file  Transportation Needs: Not on file  Physical Activity: Not on file  Stress: Not on file  Social Connections: Not on file    Family History  Problem Relation Age of Onset   Hypertension Father    Diabetes Father    Hypertension Mother    Colon polyps Mother    Hypertension Sister    Hyperlipidemia Sister    Colon polyps Brother     Outpatient Encounter Medications as of 03/03/2021  Medication Sig   ACCU-CHEK GUIDE test strip CHECK BLOOD SUGAR ONCE  DAILY   albuterol (VENTOLIN HFA) 108 (90 Base) MCG/ACT inhaler Inhale 1-2 puffs into the lungs every 6 (six) hours as needed for wheezing or  shortness of breath.   atorvastatin (LIPITOR) 20 MG tablet Take 1 tablet (20 mg total) by mouth daily.   blood glucose meter kit and supplies KIT Dispense based on patient and insurance preference. Tests sugar once a day. DX E11.9   budesonide-formoterol (SYMBICORT) 160-4.5 MCG/ACT inhaler Inhale 2 puffs into the lungs 2 (two) times daily. (Patient taking differently: Inhale 2 puffs into the lungs 2 (two) times daily. AS NEEDED)   busPIRone (BUSPAR) 5 MG tablet TAKE (1) TABLET BY MOUTH (3) TIMES DAILY AS NEEDED FOR ANXIETY (Patient taking differently: daily as needed.)   Cholecalciferol (VITAMIN D) 125 MCG (5000 UT) CAPS Take 5,000 Units by mouth daily with breakfast.   Cyanocobalamin (VITAMIN B-12) 5000 MCG SUBL Place 1 tablet under the tongue daily.   escitalopram (LEXAPRO) 20 MG tablet TAKE 1 TABLET BY MOUTH  DAILY NEEDS OFFICE VISIT   fluticasone (FLONASE) 50 MCG/ACT nasal spray Place 2 sprays into both nostrils daily. (Patient taking differently: Place 2 sprays into both  nostrils daily. As needed)   gabapentin (NEURONTIN) 300 MG capsule TAKE 1 CAPSULE BY MOUTH THREE TIMES A DAY. (Patient taking differently: TAKE 1 CAPSULE BY MOUTH THREE TIMES A DAY AS NEEDED)   hydrochlorothiazide (HYDRODIURIL) 25 MG tablet TAKE 1 TABLET BY MOUTH ONCE DAILY AS NEEDED   loratadine (CLARITIN) 10 MG tablet Take 10 mg by mouth daily.   losartan (COZAAR) 50 MG tablet TAKE 1 TABLET BY MOUTH  DAILY   metFORMIN (GLUCOPHAGE-XR) 500 MG 24 hr tablet TAKE 1 TABLET BY MOUTH  DAILY WITH BREAKFAST   Microlet Lancets MISC USE TO TEST BLOOD SUGAR ONCE DAILY.   omeprazole (PRILOSEC) 40 MG capsule Take 1 capsule (40 mg total) by mouth daily.   No facility-administered encounter medications on file as of 03/03/2021.    ALLERGIES: Allergies  Allergen Reactions   Sulfa Antibiotics Hives, Diarrhea and Itching   Other     SURGICAL TAPE Reaction: rash    Penicillins Hives, Itching and Rash    Has patient had a PCN reaction causing immediate rash, facial/tongue/throat swelling, SOB or lightheadedness with hypotension: Yes Has patient had a PCN reaction causing severe rash involving mucus membranes or skin necrosis: No Has patient had a PCN reaction that required hospitalization: No Has patient had a PCN reaction occurring within the last 10 years: Yes If all of the above answers are "NO", then may proceed with Cephalosporin use.    VACCINATION STATUS: Immunization History  Administered Date(s) Administered   Influenza,inj,Quad PF,6+ Mos 05/31/2018   Influenza-Unspecified 05/23/2011   Moderna Sars-Covid-2 Vaccination 11/02/2019, 12/04/2019, 08/06/2020   Pneumococcal Polysaccharide-23 08/20/2018    Diabetes She presents for her follow-up diabetic visit. She has type 2 diabetes mellitus. Onset time: She was diagnosed at approximate age of 27 years. Her disease course has been stable. There are no hypoglycemic associated symptoms. Pertinent negatives for hypoglycemia include no confusion,  headaches, pallor or seizures. There are no diabetic associated symptoms. Pertinent negatives for diabetes include no chest pain, no polydipsia, no polyphagia and no polyuria. There are no hypoglycemic complications. Symptoms are stable. There are no diabetic complications. Risk factors for coronary artery disease include diabetes mellitus, dyslipidemia, family history, hypertension, post-menopausal and sedentary lifestyle. Current diabetic treatment includes oral agent (monotherapy). Her weight is fluctuating minimally. She is following a generally unhealthy diet. When asked about meal planning, she reported none. She has not had a previous visit with a dietitian. She rarely participates in exercise. (She presents today with no meter  or logs to review.  She does not monitor glucose routinely due to monotherapy with Metformin only.  Her POCT A1c today is 5.5%, improving slightly from last visit of 5.9%.  She denies any s/s of hypoglycemia. ) An ACE inhibitor/angiotensin II receptor blocker is being taken. She does not see a podiatrist.Eye exam is current.  Hyperlipidemia This is a chronic problem. The current episode started more than 1 year ago. The problem is uncontrolled. Recent lipid tests were reviewed and are variable. Exacerbating diseases include diabetes. Factors aggravating her hyperlipidemia include fatty foods. Pertinent negatives include no chest pain, myalgias or shortness of breath. Current antihyperlipidemic treatment includes statins. Risk factors for coronary artery disease include dyslipidemia, diabetes mellitus, hypertension, a sedentary lifestyle and post-menopausal.  Hypertension This is a chronic problem. The current episode started more than 1 year ago. The problem has been resolved since onset. The problem is controlled. Pertinent negatives include no chest pain, headaches, palpitations or shortness of breath. Risk factors for coronary artery disease include dyslipidemia, diabetes  mellitus, family history, sedentary lifestyle and post-menopausal state. Past treatments include diuretics and angiotensin blockers. The current treatment provides moderate improvement. There are no compliance problems.     Review of systems  Constitutional: + Minimally fluctuating body weight,  current Body mass index is 28.08 kg/m. , no fatigue, no subjective hyperthermia, no subjective hypothermia Eyes: no blurry vision, no xerophthalmia ENT: no sore throat, no nodules palpated in throat, no dysphagia/odynophagia, no hoarseness Cardiovascular: no chest pain, no shortness of breath, no palpitations, no leg swelling Respiratory: no cough, no shortness of breath Gastrointestinal: no nausea/vomiting, + diarrhea Musculoskeletal: no muscle/joint aches Skin: no rashes, no hyperemia Neurological: no tremors, no numbness, no tingling, no dizziness Psychiatric: no depression, no anxiety  Objective:    BP 137/78   Pulse 67   Ht _0  (1.676 m)   Wt 174 lb (78.9 kg)   LMP 09/03/2013   BMI 28.08 kg/m   Wt Readings from Last 3 Encounters:  03/03/21 174 lb (78.9 kg)  11/17/20 177 lb (80.3 kg)  08/31/20 173 lb (78.5 kg)     BP Readings from Last 3 Encounters:  03/03/21 137/78  11/17/20 138/80  08/31/20 (!) 157/77     Physical Exam- Limited  Constitutional:  Body mass index is 28.08 kg/m. , not in acute distress, normal state of mind Eyes:  EOMI, no exophthalmos Neck: Supple Cardiovascular: RRR, no murmurs, rubs, or gallops, no edema Respiratory: Adequate breathing efforts, no crackles, rales, rhonchi, or wheezing Musculoskeletal: no gross deformities, strength intact in all four extremities, no gross restriction of joint movements Skin:  no rashes, no hyperemia Neurological: no tremor with outstretched hands    CMP     Component Value Date/Time   NA 142 03/01/2021 1142   K 4.3 03/01/2021 1142   CL 101 03/01/2021 1142   CO2 25 03/01/2021 1142   GLUCOSE 130 (H) 03/01/2021  1142   GLUCOSE 185 (H) 12/23/2019 1036   BUN 5 (L) 03/01/2021 1142   CREATININE 0.93 03/01/2021 1142   CREATININE 1.03 12/23/2019 1036   CALCIUM 9.2 03/01/2021 1142   PROT 7.1 03/01/2021 1142   ALBUMIN 4.5 03/01/2021 1142   AST 59 (H) 03/01/2021 1142   ALT 53 (H) 03/01/2021 1142   ALKPHOS 86 03/01/2021 1142   BILITOT 0.4 03/01/2021 1142   GFRNONAA 60 12/23/2019 1036   GFRAA 69 12/23/2019 1036     Diabetic Labs (most recent): Lab Results  Component Value Date  HGBA1C 5.5 03/03/2021   HGBA1C 5.9 08/31/2020   HGBA1C 5.5 12/25/2019     Lipid Panel ( most recent) Lipid Panel     Component Value Date/Time   CHOL 174 03/01/2021 1143   TRIG 351 (H) 03/01/2021 1143   HDL 45 03/01/2021 1143   CHOLHDL 3.9 03/01/2021 1143   CHOLHDL 3.9 09/10/2014 0805   VLDL 25 09/10/2014 0805   LDLCALC 73 03/01/2021 1143   LABVLDL 56 (H) 03/01/2021 1143     Assessment & Plan:   1) Type 2 diabetes mellitus with hyperglycemia, without long-term current use of insulin (HCC)  - Tawonna Esquer has currently uncontrolled symptomatic type 2 DM since  60 years of age.  She presents today with no meter or logs to review.  She does not monitor glucose routinely due to monotherapy with Metformin only.  Her POCT A1c today is 5.5%, improving slightly from last visit of 5.9%.  She denies any s/s of hypoglycemia.   Recent labs reviewed.  - I had a long discussion with her about the progressive nature of diabetes and the pathology behind its complications. -She does not report any gross complications from her diabetes, however, she remains at a high risk for more acute and chronic complications which include CAD, CVA, CKD, retinopathy, and neuropathy. These are all discussed in detail with her.  - Nutritional counseling repeated at each appointment due to patients tendency to fall back in to old habits.  - The patient admits there is a room for improvement in their diet and drink choices. -  Suggestion  is made for the patient to avoid simple carbohydrates from their diet including Cakes, Sweet Desserts / Pastries, Ice Cream, Soda (diet and regular), Sweet Tea, Candies, Chips, Cookies, Sweet Pastries, Store Bought Juices, Alcohol in Excess of 1-2 drinks a day, Artificial Sweeteners, Coffee Creamer, and "Sugar-free" Products. This will help patient to have stable blood glucose profile and potentially avoid unintended weight gain.   - I encouraged the patient to switch to unprocessed or minimally processed complex starch and increased protein intake (animal or plant source), fruits, and vegetables.   - Patient is advised to stick to a routine mealtimes to eat 3 meals a day and avoid unnecessary snacks (to snack only to correct hypoglycemia).  - she has been scheduled with Jearld Fenton, RDN, CDE for diabetes education.  - I have approached her with the following individualized plan to manage  her diabetes and patient agrees:   -In light of her presentation with controlled glycemia with A1c of 5.5%, she will not need insulin treatment for now.  She has tolerated Metformin, advised to continue Metformin 500 mg XR p.o. daily after breakfast.     - she will be considered for incretin therapy as appropriate next visit.  - Specific targets for  A1c;  LDL, HDL,  and Triglycerides were discussed with the patient.  2) Blood Pressure /Hypertension:  Her blood pressure is controlled to target. she is advised to continue her current medications including HCTZ 25 mg p.o. daily with breakfast .  She is also on Losartan 50 mg p.o. daily.  3) Lipids/Hyperlipidemia:    Review of her recent lipid panel from 03/01/21 showed controlled LDL at 73 and elevated triglycerides of 351 .  she is advised to continue Atorvastatin 20 mg p.o. daily at bedtime.  Side effects and precautions discussed with her.  She is also advised to avoid fried foods and butter.  4)  Weight/Diet:   Her  Body mass index is 28.08 kg/m.  -    she is a candidate for modest weight loss. I discussed with her the fact that loss of 5 - 10% of her  current body weight will have the most impact on her diabetes management.  Exercise, and detailed carbohydrates information provided  -  detailed on discharge instructions.  5) Chronic Care/Health Maintenance: -she  is on ARB and statin medications and  is encouraged to initiate and continue to follow up with Ophthalmology, Dentist,  Podiatrist at least yearly or according to recommendations, and advised to stay away from smoking. I have recommended yearly flu vaccine and pneumonia vaccine at least every 5 years; moderate intensity exercise for up to 150 minutes weekly; and  sleep for at least 7 hours a day.   - she is  advised to maintain close follow up with Erven Colla, DO for primary care needs, as well as her other providers for optimal and coordinated care.     I spent 30 minutes in the care of the patient today including review of labs from [place], Lipids, Thyroid Function, Hematology (current and previous including abstractions from other facilities); face-to-face time discussing  her blood glucose readings/logs, discussing hypoglycemia and hyperglycemia episodes and symptoms, medications doses, her options of short and long term treatment based on the latest standards of care / guidelines;  discussion about incorporating lifestyle medicine;  and documenting the encounter.    Please refer to Patient Instructions for Blood Glucose Monitoring and Insulin/Medications Dosing Guide"  in media tab for additional information. Please  also refer to " Patient Self Inventory" in the Media  tab for reviewed elements of pertinent patient history.  Sheppard Penton participated in the discussions, expressed understanding, and voiced agreement with the above plans.  All questions were answered to her satisfaction. she is encouraged to contact clinic should she have any questions or concerns prior to her  return visit.   Follow up plan: - Return in about 6 months (around 09/03/2021) for Diabetes F/U with A1c in office, No previsit labs.  Rayetta Pigg, [facility] [address] [place], [place] [address] Phone: [phone] Fax: [phone]  03/03/2021, 1:56 PM

## 2021-03-03 NOTE — Patient Instructions (Signed)

## 2021-04-20 ENCOUNTER — Other Ambulatory Visit: Payer: Self-pay | Admitting: Family Medicine

## 2021-04-20 DIAGNOSIS — E1165 Type 2 diabetes mellitus with hyperglycemia: Secondary | ICD-10-CM

## 2021-04-21 ENCOUNTER — Other Ambulatory Visit: Payer: Self-pay | Admitting: "Endocrinology

## 2021-04-22 ENCOUNTER — Other Ambulatory Visit: Payer: Self-pay

## 2021-04-22 ENCOUNTER — Ambulatory Visit (INDEPENDENT_AMBULATORY_CARE_PROVIDER_SITE_OTHER): Payer: Medicare Other | Admitting: Family Medicine

## 2021-04-22 VITALS — BP 145/75 | HR 69 | Temp 98.1°F | Ht 66.0 in | Wt 168.0 lb

## 2021-04-22 DIAGNOSIS — F419 Anxiety disorder, unspecified: Secondary | ICD-10-CM | POA: Diagnosis not present

## 2021-04-22 DIAGNOSIS — E1165 Type 2 diabetes mellitus with hyperglycemia: Secondary | ICD-10-CM

## 2021-04-22 DIAGNOSIS — E782 Mixed hyperlipidemia: Secondary | ICD-10-CM

## 2021-04-22 DIAGNOSIS — R195 Other fecal abnormalities: Secondary | ICD-10-CM

## 2021-04-22 DIAGNOSIS — I10 Essential (primary) hypertension: Secondary | ICD-10-CM

## 2021-04-22 MED ORDER — LOSARTAN POTASSIUM 50 MG PO TABS: 50.0000 mg | ORAL_TABLET | Freq: Every day | ORAL | 1 refills | Status: DC

## 2021-04-22 MED ORDER — GABAPENTIN 300 MG PO CAPS: ORAL_CAPSULE | ORAL | 1 refills | Status: DC

## 2021-04-22 MED ORDER — HYDROCHLOROTHIAZIDE 25 MG PO TABS: ORAL_TABLET | ORAL | 1 refills | Status: DC

## 2021-04-22 MED ORDER — ESCITALOPRAM OXALATE 20 MG PO TABS: ORAL_TABLET | ORAL | 1 refills | Status: DC

## 2021-04-22 MED ORDER — BUSPIRONE HCL 5 MG PO TABS: ORAL_TABLET | ORAL | 5 refills | Status: DC

## 2021-04-22 MED ORDER — METFORMIN HCL ER 500 MG PO TB24: 500.0000 mg | ORAL_TABLET | Freq: Every day | ORAL | 1 refills | Status: DC

## 2021-04-22 MED ORDER — ATORVASTATIN CALCIUM 20 MG PO TABS: 20.0000 mg | ORAL_TABLET | Freq: Every day | ORAL | 1 refills | Status: DC

## 2021-04-22 MED ORDER — OMEPRAZOLE 40 MG PO CPDR
40.0000 mg | DELAYED_RELEASE_CAPSULE | Freq: Every day | ORAL | 1 refills | Status: DC
Start: 2021-04-22 — End: 2021-11-12

## 2021-04-22 NOTE — Progress Notes (Signed)
Patient ID: Laura Scott, female    DOB: March 30, 1961, 60 y.o.   MRN: [id number]   Chief Complaint  Patient presents with   Hyperlipidemia    Lab results   Anxiety    Depression   GI referral request    Problems with bowels x 3  months - seeing white clumps w / watery yellow stool on and off   Subjective:    HPI  HLD- doing well no new concerns.  Compliant with meds. No chest pain, palpitations, myalgias or joint pains.  HTN Pt compliant with BP meds.  No SEs Denies chest pain, sob, LE swelling, or blurry vision.   Has been eating "what she wants." Has been drinking a lot of light beer.  Noticing something white and "pill-shaped" in her stools. No blood in stool.  Thought garlic in the bowels , look like size of metformin pills.   No camping,  Went to [place]- in may. Notice about 3 months. White think in stool when has watery stools. Pt stating was dx with IBS aobut 5 yrs ago. Didn't see anyone for it. Has loose 3x per day about 1 x per week.    Last pap 2018.  Medical History Laura Scott has a past medical history of Allergic rhinitis, Anxiety, Chronic pain, Depression, Diabetes mellitus without complication ([place]), GERD (gastroesophageal reflux disease), Headache, Hyperlipidemia, Hypertension, IBS (irritable bowel syndrome), PMS (premenstrual syndrome), and Reflux.   Outpatient Encounter Medications as of 04/22/2021  Medication Sig   ACCU-CHEK GUIDE test strip CHECK BLOOD SUGAR ONCE  DAILY   albuterol (VENTOLIN HFA) 108 (90 Base) MCG/ACT inhaler Inhale 1-2 puffs into the lungs every 6 (six) hours as needed for wheezing or shortness of breath.   blood glucose meter kit and supplies KIT Dispense based on patient and insurance preference. Tests sugar once a day. DX E11.9   budesonide-formoterol (SYMBICORT) 160-4.5 MCG/ACT inhaler Inhale 2 puffs into the lungs 2 (two) times daily. (Patient taking differently: Inhale 2 puffs into the lungs 2 (two) times daily. AS NEEDED)    Cholecalciferol (VITAMIN D) 125 MCG (5000 UT) CAPS Take 5,000 Units by mouth daily with breakfast.   Cyanocobalamin (VITAMIN B-12) 5000 MCG SUBL Place 1 tablet under the tongue daily.   fluticasone (FLONASE) 50 MCG/ACT nasal spray Place 2 sprays into both nostrils daily. (Patient taking differently: Place 2 sprays into both nostrils daily. As needed)   loratadine (CLARITIN) 10 MG tablet Take 10 mg by mouth daily.   Microlet Lancets MISC USE TO TEST BLOOD SUGAR  ONCE DAILY   [DISCONTINUED] atorvastatin (LIPITOR) 20 MG tablet Take 1 tablet (20 mg total) by mouth daily.   [DISCONTINUED] busPIRone (BUSPAR) 5 MG tablet TAKE (1) TABLET BY MOUTH (3) TIMES DAILY AS NEEDED FOR ANXIETY (Patient taking differently: daily as needed.)   [DISCONTINUED] escitalopram (LEXAPRO) 20 MG tablet TAKE 1 TABLET BY MOUTH  DAILY NEEDS OFFICE VISIT   [DISCONTINUED] gabapentin (NEURONTIN) 300 MG capsule TAKE 1 CAPSULE BY MOUTH THREE TIMES A DAY. (Patient taking differently: TAKE 1 CAPSULE BY MOUTH THREE TIMES A DAY AS NEEDED)   [DISCONTINUED] hydrochlorothiazide (HYDRODIURIL) 25 MG tablet TAKE 1 TABLET BY MOUTH ONCE DAILY AS NEEDED   [DISCONTINUED] losartan (COZAAR) 50 MG tablet TAKE 1 TABLET BY MOUTH  DAILY   [DISCONTINUED] metFORMIN (GLUCOPHAGE-XR) 500 MG 24 hr tablet TAKE 1 TABLET BY MOUTH  DAILY WITH BREAKFAST   [DISCONTINUED] omeprazole (PRILOSEC) 40 MG capsule Take 1 capsule (40 mg total) by mouth daily.   atorvastatin (  LIPITOR) 20 MG tablet Take 1 tablet (20 mg total) by mouth daily.   busPIRone (BUSPAR) 5 MG tablet TAKE (1) TABLET BY MOUTH (3) TIMES DAILY AS NEEDED FOR ANXIETY   escitalopram (LEXAPRO) 20 MG tablet TAKE 1 TABLET BY MOUTH  DAILY NEEDS OFFICE VISIT   gabapentin (NEURONTIN) 300 MG capsule TAKE 1 CAPSULE BY MOUTH THREE TIMES A DAY.   hydrochlorothiazide (HYDRODIURIL) 25 MG tablet TAKE 1 TABLET BY MOUTH ONCE DAILY AS NEEDED   losartan (COZAAR) 50 MG tablet Take 1 tablet (50 mg total) by mouth daily.    metFORMIN (GLUCOPHAGE-XR) 500 MG 24 hr tablet Take 1 tablet (500 mg total) by mouth daily with breakfast.   omeprazole (PRILOSEC) 40 MG capsule Take 1 capsule (40 mg total) by mouth daily.   [DISCONTINUED] hydrochlorothiazide (HYDRODIURIL) 25 MG tablet TAKE 1 TABLET BY MOUTH ONCE DAILY AS NEEDED   No facility-administered encounter medications on file as of 04/22/2021.     Review of Systems  Constitutional:  Negative for chills and fever.  HENT:  Negative for congestion, rhinorrhea and sore throat.   Respiratory:  Negative for cough, shortness of breath and wheezing.   Cardiovascular:  Negative for chest pain and leg swelling.  Gastrointestinal:  Negative for abdominal pain, diarrhea, nausea and vomiting.  Genitourinary:  Negative for dysuria and frequency.  Musculoskeletal:  Negative for arthralgias and back pain.  Skin:  Negative for rash.  Neurological:  Negative for dizziness, weakness and headaches.    Vitals BP (!) 145/75   Pulse 69   Temp 98.1 F (36.7 C)   Ht '5\' 6"'  (1.676 m)   Wt 168 lb (76.2 kg)   LMP 09/03/2013   SpO2 97%   BMI 27.12 kg/m   Objective:   Physical Exam Vitals and nursing note reviewed.  Constitutional:      General: She is not in acute distress.    Appearance: Normal appearance. She is not ill-appearing.  HENT:     Head: Normocephalic and atraumatic.     Nose: Nose normal.     Mouth/Throat:     Mouth: Mucous membranes are moist.     Pharynx: Oropharynx is clear.  Eyes:     Extraocular Movements: Extraocular movements intact.     Conjunctiva/sclera: Conjunctivae normal.     Pupils: Pupils are equal, round, and reactive to light.  Cardiovascular:     Rate and Rhythm: Normal rate and regular rhythm.     Pulses: Normal pulses.     Heart sounds: Normal heart sounds.  Pulmonary:     Effort: Pulmonary effort is normal.     Breath sounds: Normal breath sounds. No wheezing, rhonchi or rales.  Musculoskeletal:        General: Normal range of  motion.     Right lower leg: No edema.     Left lower leg: No edema.  Skin:    General: Skin is warm and dry.     Findings: No lesion or rash.  Neurological:     General: No focal deficit present.     Mental Status: She is alert and oriented to person, place, and time.  Psychiatric:        Mood and Affect: Mood normal.        Behavior: Behavior normal.     Assessment and Plan   1. Anxiety - busPIRone (BUSPAR) 5 MG tablet; TAKE (1) TABLET BY MOUTH (3) TIMES DAILY AS NEEDED FOR ANXIETY  Dispense: 90 tablet; Refill: 5 - escitalopram (  LEXAPRO) 20 MG tablet; TAKE 1 TABLET BY MOUTH  DAILY NEEDS OFFICE VISIT  Dispense: 90 tablet; Refill: 1  2. Loose stools - Ambulatory referral to Gastroenterology  3. Stool contents finding, abnormal - Ambulatory referral to Gastroenterology  4. Mixed hyperlipidemia - atorvastatin (LIPITOR) 20 MG tablet; Take 1 tablet (20 mg total) by mouth daily.  Dispense: 90 tablet; Refill: 1 - gabapentin (NEURONTIN) 300 MG capsule; TAKE 1 CAPSULE BY MOUTH THREE TIMES A DAY.  Dispense: 90 capsule; Refill: 1 - hydrochlorothiazide (HYDRODIURIL) 25 MG tablet; TAKE 1 TABLET BY MOUTH ONCE DAILY AS NEEDED  Dispense: 90 tablet; Refill: 1 - losartan (COZAAR) 50 MG tablet; Take 1 tablet (50 mg total) by mouth daily.  Dispense: 90 tablet; Refill: 1 - metFORMIN (GLUCOPHAGE-XR) 500 MG 24 hr tablet; Take 1 tablet (500 mg total) by mouth daily with breakfast.  Dispense: 90 tablet; Refill: 1 - omeprazole (PRILOSEC) 40 MG capsule; Take 1 capsule (40 mg total) by mouth daily.  Dispense: 90 capsule; Refill: 1  5. Type 2 diabetes mellitus with hyperglycemia, without long-term current use of insulin (HCC) - atorvastatin (LIPITOR) 20 MG tablet; Take 1 tablet (20 mg total) by mouth daily.  Dispense: 90 tablet; Refill: 1 - hydrochlorothiazide (HYDRODIURIL) 25 MG tablet; TAKE 1 TABLET BY MOUTH ONCE DAILY AS NEEDED  Dispense: 90 tablet; Refill: 1 - losartan (COZAAR) 50 MG tablet; Take 1  tablet (50 mg total) by mouth daily.  Dispense: 90 tablet; Refill: 1 - metFORMIN (GLUCOPHAGE-XR) 500 MG 24 hr tablet; Take 1 tablet (500 mg total) by mouth daily with breakfast.  Dispense: 90 tablet; Refill: 1 - omeprazole (PRILOSEC) 40 MG capsule; Take 1 capsule (40 mg total) by mouth daily.  Dispense: 90 capsule; Refill: 1  6. Essential hypertension, benign - hydrochlorothiazide (HYDRODIURIL) 25 MG tablet; TAKE 1 TABLET BY MOUTH ONCE DAILY AS NEEDED  Dispense: 90 tablet; Refill: 1 - losartan (COZAAR) 50 MG tablet; Take 1 tablet (50 mg total) by mouth daily.  Dispense: 90 tablet; Refill: 1   Anxiety- stable. Cont meds.  Loose stools- intermittent. referral given to GI.  Dm2- a1c 5.5, controlled, cont meds.  Hld- elevated Tgs at 351, needs to dec carbs in diet and inc in exercising. All other cholesterol labs stable, controlled. Dec light beer in the diet.  Htn- suboptimal. Dec salt in diet. Cont meds.  Return in about 6 months (around 10/20/2021) for f/u htn, hld, dm2.

## 2021-04-23 ENCOUNTER — Encounter: Payer: Self-pay | Admitting: Internal Medicine

## 2021-06-02 ENCOUNTER — Other Ambulatory Visit: Payer: Self-pay | Admitting: Family Medicine

## 2021-06-02 DIAGNOSIS — E782 Mixed hyperlipidemia: Secondary | ICD-10-CM

## 2021-07-14 ENCOUNTER — Other Ambulatory Visit: Payer: Self-pay | Admitting: "Endocrinology

## 2021-07-14 DIAGNOSIS — E1165 Type 2 diabetes mellitus with hyperglycemia: Secondary | ICD-10-CM

## 2021-07-14 DIAGNOSIS — I10 Essential (primary) hypertension: Secondary | ICD-10-CM

## 2021-07-14 DIAGNOSIS — E782 Mixed hyperlipidemia: Secondary | ICD-10-CM

## 2021-07-30 ENCOUNTER — Encounter: Payer: Self-pay | Admitting: Gastroenterology

## 2021-07-30 ENCOUNTER — Ambulatory Visit: Payer: Medicare Other | Admitting: Gastroenterology

## 2021-07-30 ENCOUNTER — Other Ambulatory Visit: Payer: Self-pay

## 2021-07-30 DIAGNOSIS — R6881 Early satiety: Secondary | ICD-10-CM | POA: Insufficient documentation

## 2021-07-30 DIAGNOSIS — R197 Diarrhea, unspecified: Secondary | ICD-10-CM | POA: Diagnosis not present

## 2021-07-30 DIAGNOSIS — R1013 Epigastric pain: Secondary | ICD-10-CM

## 2021-07-30 DIAGNOSIS — R7989 Other specified abnormal findings of blood chemistry: Secondary | ICD-10-CM

## 2021-07-30 MED ORDER — DICYCLOMINE HCL 10 MG PO CAPS: 10.0000 mg | ORAL_CAPSULE | Freq: Three times a day (TID) | ORAL | 1 refills | Status: DC | PRN

## 2021-07-30 NOTE — Progress Notes (Signed)
Primary Care Physician: Coral Spikes, DO  Primary Gastroenterologist:  Garfield Cornea, MD   Chief Complaint  Patient presents with   Diarrhea    Several times a day, notices her metformin is in her stool, no bleeding    HPI: Laura Scott is a 60 y.o. female here at the request of former PCP, Dr. Lovena Le, for further evaluation of diarrhea. Her new PCP will be Dr.Jayce Lacinda Axon.   Patient has several month history of change in stools. Previously predominantly constipated several years ago but was also on chronic pain medication at the time. Now having loose to watery yellow stools. Often has several stools in a row in the morning and then may have 1 later in the afternoon. No nocturnal stools. No melena, brbpr. No abdominal pain. She has occasional solid stool. She is concerned that she sees her metformin pills in her stools. Sometimes she will see several even though she only takes  one a day. Her A1C is 5.5. Her sugars are well controlled. She denies any weight loss. She also complains of poor appetite. Does not get hungry and feels full a lot. Will eat 1-2 times per day. No nausea. No heartburn. No dysphagia.   She has had mild transaminitis off and on.  She has been on cholesterol medication for couple years but did take a break at 1 time due to body aches.  She consumes approximately 3 beers daily.  Mom has hepatitis, she does not know what type.  Patient has never received a blood transfusion.  She has had her eyebrows tattooed but that was just recently.  Denies any history of IV or intranasal drug use.  Colonoscopy 10/2016: 3 polyps removed, no adenomatous colon polyps.  Due to family history of colon polyps, recommended 5-year follow-up colonoscopy.  EGD March 2018: LA grade a esophagitis, duodenal erosions, small hiatal hernia  Current Outpatient Medications  Medication Sig Dispense Refill   ACCU-CHEK GUIDE test strip CHECK BLOOD SUGAR ONCE  DAILY 100 strip 3   albuterol  (VENTOLIN HFA) 108 (90 Base) MCG/ACT inhaler Inhale 1-2 puffs into the lungs every 6 (six) hours as needed for wheezing or shortness of breath. 18 g 0   atorvastatin (LIPITOR) 20 MG tablet Take 1 tablet (20 mg total) by mouth daily. 90 tablet 1   blood glucose meter kit and supplies KIT Dispense based on patient and insurance preference. Tests sugar once a day. DX E11.9 1 each 0   budesonide-formoterol (SYMBICORT) 160-4.5 MCG/ACT inhaler Inhale 2 puffs into the lungs 2 (two) times daily. (Patient taking differently: Inhale 2 puffs into the lungs 2 (two) times daily. AS NEEDED) 1 Inhaler 5   busPIRone (BUSPAR) 5 MG tablet TAKE (1) TABLET BY MOUTH (3) TIMES DAILY AS NEEDED FOR ANXIETY 90 tablet 5   Cholecalciferol (VITAMIN D) 125 MCG (5000 UT) CAPS Take 5,000 Units by mouth daily with breakfast.     Cyanocobalamin (VITAMIN B-12) 5000 MCG SUBL Place 1 tablet under the tongue daily.     dicyclomine (BENTYL) 10 MG capsule Take 1 capsule (10 mg total) by mouth 3 (three) times daily as needed for spasms. As needed for loose stool. 90 capsule 1   escitalopram (LEXAPRO) 20 MG tablet TAKE 1 TABLET BY MOUTH  DAILY NEEDS OFFICE VISIT 90 tablet 1   fluticasone (FLONASE) 50 MCG/ACT nasal spray Place 2 sprays into both nostrils daily. (Patient taking differently: Place 2 sprays into both nostrils daily. As needed) 48 g  5   gabapentin (NEURONTIN) 300 MG capsule TAKE 1 CAPSULE BY MOUTH 3  TIMES DAILY (Patient taking differently: TAKE 1 CAPSULE BY MOUTH 3  TIMES DAILY as needed) 180 capsule 1   hydrochlorothiazide (HYDRODIURIL) 25 MG tablet TAKE 1 TABLET BY MOUTH ONCE DAILY AS NEEDED 90 tablet 1   loratadine (CLARITIN) 10 MG tablet Take 10 mg by mouth daily.     losartan (COZAAR) 50 MG tablet TAKE 1 TABLET BY MOUTH  DAILY 90 tablet 0   metFORMIN (GLUCOPHAGE-XR) 500 MG 24 hr tablet Take 1 tablet (500 mg total) by mouth daily with breakfast. 90 tablet 1   Microlet Lancets MISC USE TO TEST BLOOD SUGAR  ONCE DAILY 100 each  3   omeprazole (PRILOSEC) 40 MG capsule Take 1 capsule (40 mg total) by mouth daily. 90 capsule 1   No current facility-administered medications for this visit.    Allergies as of 07/30/2021 - Review Complete 07/30/2021  Allergen Reaction Noted   Sulfa antibiotics Hives, Diarrhea, and Itching 07/20/2011   Other  07/20/2011   Penicillins Hives, Itching, and Rash 03/15/2017    ROS:  General: Negative for  weight loss, fever, chills, fatigue, weakness.  See HPI ENT: Negative for hoarseness, difficulty swallowing , nasal congestion. CV: Negative for chest pain, angina, palpitations, dyspnea on exertion, peripheral edema.  Respiratory: Negative for dyspnea at rest, dyspnea on exertion, cough, sputum, wheezing.  GI: See history of present illness. GU:  Negative for dysuria, hematuria, urinary incontinence, urinary frequency, nocturnal urination.  Endo: Negative for unusual weight change.    Physical Examination:   BP (!) 164/70   Pulse 86   Temp (!) 97.3 F (36.3 C)   Ht _0  (1.676 m)   Wt 174 lb 9.6 oz (79.2 kg)   LMP 09/03/2013   BMI 28.18 kg/m   General: Well-nourished, well-developed in no acute distress.  Eyes: No icterus. Mouth: masked Lungs: Clear to auscultation bilaterally.  Heart: Regular rate and rhythm, no murmurs rubs or gallops.  Abdomen: Bowel sounds are normal, nondistended, no hepatosplenomegaly or masses, no abdominal bruits or hernia , no rebound or guarding.  Mild epigastric tenderness Extremities: No lower extremity edema. No clubbing or deformities. Neuro: Alert and oriented x 4   Skin: Warm and dry, no jaundice.   Psych: Alert and cooperative, normal mood and affect.  Labs:  Lab Results  Component Value Date   CREATININE 0.93 03/01/2021   BUN 5 (L) 03/01/2021   NA 142 03/01/2021   K 4.3 03/01/2021   CL 101 03/01/2021   CO2 25 03/01/2021   Lab Results  Component Value Date   ALT 53 (H) 03/01/2021   AST 59 (H) 03/01/2021   ALKPHOS 86  03/01/2021   BILITOT 0.4 03/01/2021   Lab Results  Component Value Date   WBC 5.9 03/01/2021   HGB 12.9 03/01/2021   HCT 37.1 03/01/2021   MCV 96 03/01/2021   PLT 264 03/01/2021   Lab Results  Component Value Date   TSH 2.80 12/23/2019     Lab Results  Component Value Date   HGBA1C 5.5 03/03/2021    Imaging Studies: No results found.   Assessment:  Diarrhea: Occurring for several months.  Typically having symptoms mostly in the morning.  Denies any nocturnal symptoms or blood in the stool.  She has had no weight loss.  Denies any recent antibiotic use.  Less likely that we are dealing with an infectious etiology but would consider.  Possibly  medication side effect.  IBS remains in the differential as well.  Not sure what to make of items that she is seeing in her stool, she believes they are her metformin.  Her diabetes is well controlled indicating she is likely having adequate absorption of medication.  Epigastric pain, early satiety, poor appetite: Does not get hungry.  Feels full all the time.  Eats 1 or 2 meals a day.  Weight is stable.  No nausea.  No abdominal pain.  Takes omeprazole as needed for acid reflux but waits till she has symptoms before taking.  She is not taking on a regular basis.  She could have some delayed gastric emptying, gastritis, less likely ulcers.  Abnormal LFTs: Mild intermittent elevated transaminases.  Could be due to alcohol consumption or medication.  We will screen for hepatitis B and C.  Rule out hemochromatosis, autoimmune processes.  If no significant findings, would recommend abdominal ultrasound at that point.     Plan: Patient has new insurance starting January 1 and she prefers to wait to have labs and stool test done at that time.  Orders provided.  If her symptoms worsen, she has been advised to go sinker to the lab. Trial of dicyclomine 10 mg up to 3 times daily for diarrhea. She will start in her omeprazole every morning before  breakfast for the next few weeks to see if this helps with her upper abdominal tenderness and early satiety.  If no significant improvement, would consider further work-up.  This may include CT initially versus upper endoscopy. Have requested she cut back on alcohol consumption.  Drinking no more than 2 drinks a day, goal of less.

## 2021-07-30 NOTE — Patient Instructions (Addendum)
Please have your labs and stool test done. You can wait until Jan due to your insurance changes. If you symptoms worsen, please go sooner.  Trial of dicyclomine 10mg  up to three times daily for diarrhea.  Take your omeprazole every day before breakfast for the next few weeks to see if this helps your upper stomach soreness and your feeling full sensation. If you have no improvement, you will need further work up.  Cut back on alcohol consumption. You should no drink more than two drinks a day.

## 2021-08-31 ENCOUNTER — Other Ambulatory Visit: Payer: Self-pay | Admitting: Internal Medicine

## 2021-08-31 DIAGNOSIS — Z1231 Encounter for screening mammogram for malignant neoplasm of breast: Secondary | ICD-10-CM

## 2021-09-02 DIAGNOSIS — R7989 Other specified abnormal findings of blood chemistry: Secondary | ICD-10-CM | POA: Diagnosis not present

## 2021-09-02 DIAGNOSIS — R6881 Early satiety: Secondary | ICD-10-CM | POA: Diagnosis not present

## 2021-09-02 DIAGNOSIS — R1013 Epigastric pain: Secondary | ICD-10-CM | POA: Diagnosis not present

## 2021-09-02 DIAGNOSIS — R197 Diarrhea, unspecified: Secondary | ICD-10-CM | POA: Diagnosis not present

## 2021-09-05 LAB — CBC WITH DIFFERENTIAL/PLATELET
Basophils Absolute: 0 10*3/uL (ref 0.0–0.2)
Basos: 1 %
EOS (ABSOLUTE): 0.1 10*3/uL (ref 0.0–0.4)
Eos: 1 %
Hematocrit: 41.4 % (ref 34.0–46.6)
Hemoglobin: 13.9 g/dL (ref 11.1–15.9)
Immature Grans (Abs): 0 10*3/uL (ref 0.0–0.1)
Immature Granulocytes: 0 %
Lymphocytes Absolute: 1.6 10*3/uL (ref 0.7–3.1)
Lymphs: 20 %
MCH: 31.1 pg (ref 26.6–33.0)
MCHC: 33.6 g/dL (ref 31.5–35.7)
MCV: 93 fL (ref 79–97)
Monocytes Absolute: 0.5 10*3/uL (ref 0.1–0.9)
Monocytes: 7 %
Neutrophils Absolute: 5.4 10*3/uL (ref 1.4–7.0)
Neutrophils: 71 %
Platelets: 377 10*3/uL (ref 150–450)
RBC: 4.47 x10E6/uL (ref 3.77–5.28)
RDW: 11 % — ABNORMAL LOW (ref 11.7–15.4)
WBC: 7.6 10*3/uL (ref 3.4–10.8)

## 2021-09-05 LAB — ANA: Anti Nuclear Antibody (ANA): POSITIVE — AB

## 2021-09-05 LAB — COMPREHENSIVE METABOLIC PANEL
ALT: 29 IU/L (ref 0–32)
AST: 34 IU/L (ref 0–40)
Albumin/Globulin Ratio: 1.4 (ref 1.2–2.2)
Albumin: 4.9 g/dL (ref 3.8–4.9)
Alkaline Phosphatase: 105 IU/L (ref 44–121)
BUN/Creatinine Ratio: 10 — ABNORMAL LOW (ref 12–28)
BUN: 8 mg/dL (ref 8–27)
Bilirubin Total: 0.6 mg/dL (ref 0.0–1.2)
CO2: 25 mmol/L (ref 20–29)
Calcium: 10 mg/dL (ref 8.7–10.3)
Chloride: 96 mmol/L (ref 96–106)
Creatinine, Ser: 0.79 mg/dL (ref 0.57–1.00)
Globulin, Total: 3.5 g/dL (ref 1.5–4.5)
Glucose: 151 mg/dL — ABNORMAL HIGH (ref 70–99)
Potassium: 4.2 mmol/L (ref 3.5–5.2)
Sodium: 139 mmol/L (ref 134–144)
Total Protein: 8.4 g/dL (ref 6.0–8.5)
eGFR: 86 mL/min/{1.73_m2} (ref >59)

## 2021-09-05 LAB — HEPATITIS C ANTIBODY: Hep C Virus Ab: <0.1 s/co ratio (ref 0.0–0.9)

## 2021-09-05 LAB — IRON,TIBC AND FERRITIN PANEL
Ferritin: 517 ng/mL — ABNORMAL HIGH (ref 15–150)
Iron Saturation: 43 % (ref 15–55)
Iron: 119 ug/dL (ref 27–159)
Total Iron Binding Capacity: 276 ug/dL (ref 250–450)
UIBC: 157 ug/dL (ref 131–425)

## 2021-09-05 LAB — IGG, IGA, IGM
IgA/Immunoglobulin A, Serum: 565 mg/dL — ABNORMAL HIGH (ref 87–352)
IgG (Immunoglobin G), Serum: 1324 mg/dL (ref 586–1602)
IgM (Immunoglobulin M), Srm: 147 mg/dL (ref 26–217)

## 2021-09-05 LAB — TISSUE TRANSGLUTAMINASE, IGA: Transglutaminase IgA: <2 U/mL (ref 0–3)

## 2021-09-05 LAB — HEPATITIS B SURFACE ANTIGEN: Hepatitis B Surface Ag: NEGATIVE

## 2021-09-05 LAB — LIPASE: Lipase: 24 U/L (ref 14–72)

## 2021-09-05 LAB — MITOCHONDRIAL/SMOOTH MUSCLE AB PNL
Mitochondrial Ab: <20 Units (ref 0.0–20.0)
Smooth Muscle Ab: 5 Units (ref 0–19)

## 2021-09-06 ENCOUNTER — Encounter: Payer: Self-pay | Admitting: Nutrition

## 2021-09-06 ENCOUNTER — Other Ambulatory Visit: Payer: Self-pay

## 2021-09-06 ENCOUNTER — Encounter: Payer: No Typology Code available for payment source | Attending: "Endocrinology | Admitting: Nutrition

## 2021-09-06 ENCOUNTER — Encounter: Payer: Self-pay | Admitting: "Endocrinology

## 2021-09-06 ENCOUNTER — Ambulatory Visit (INDEPENDENT_AMBULATORY_CARE_PROVIDER_SITE_OTHER): Payer: No Typology Code available for payment source | Admitting: "Endocrinology

## 2021-09-06 VITALS — BP 126/68 | HR 84 | Ht 66.0 in | Wt 174.2 lb

## 2021-09-06 VITALS — Ht 65.5 in | Wt 175.0 lb

## 2021-09-06 DIAGNOSIS — E782 Mixed hyperlipidemia: Secondary | ICD-10-CM | POA: Diagnosis not present

## 2021-09-06 DIAGNOSIS — I10 Essential (primary) hypertension: Secondary | ICD-10-CM | POA: Diagnosis not present

## 2021-09-06 DIAGNOSIS — E1165 Type 2 diabetes mellitus with hyperglycemia: Secondary | ICD-10-CM

## 2021-09-06 LAB — POCT GLYCOSYLATED HEMOGLOBIN (HGB A1C): HbA1c, POC (controlled diabetic range): 5.9 % (ref 0.0–7.0)

## 2021-09-06 MED ORDER — VITAMIN D 125 MCG (5000 UT) PO CAPS: 5000.0000 [IU] | ORAL_CAPSULE | Freq: Every day | ORAL | 2 refills | Status: DC

## 2021-09-06 NOTE — Patient Instructions (Addendum)
Goals  Focus on more plant based foods Eat meals  on time.  Try to get up earlier Walk or exercise 60 mins three times per week.  Cut out processed foods

## 2021-09-06 NOTE — Progress Notes (Signed)
09/06/2021, 6:03 PM    Endocrinology follow-up note    Subjective:    Patient ID: Laura Scott, female    DOB: April 01, 1961.  Laura Scott is being seen in follow-up after she was seen in consultation for  management of currently uncontrolled symptomatic diabetes requested by  Lindell Spar, MD.   Past Medical History:  Diagnosis Date   Allergic rhinitis    Anxiety    Chronic pain    Depression    Diabetes mellitus without complication (HCC)    GERD (gastroesophageal reflux disease)    Headache    Hyperlipidemia    Hypertension    IBS (irritable bowel syndrome)    PMS (premenstrual syndrome)    Reflux     Past Surgical History:  Procedure Laterality Date   COLONOSCOPY  08/29/2011   [name]: normal. high risk screening tcs in 5 years.    COLONOSCOPY WITH PROPOFOL N/A 10/20/2016   Procedure: COLONOSCOPY WITH PROPOFOL;  Surgeon: Daneil Dolin, MD;  [facility]: [facility];  Service: Endoscopy;  Laterality: N/A;  10:45am   ESOPHAGOGASTRODUODENOSCOPY (EGD) WITH PROPOFOL N/A 10/20/2016   Procedure: ESOPHAGOGASTRODUODENOSCOPY (EGD) WITH PROPOFOL;  Surgeon: Daneil Dolin, MD;  Location: [facility];  Service: Endoscopy;  Laterality: N/A;   LUMBAR [place] N/A 10/20/2016   Procedure: Venia Minks DILATION;  Surgeon: Daneil Dolin, MD;  Location: [facility];  Service: Endoscopy;  Laterality: N/A;   POLYPECTOMY  10/20/2016   Procedure: POLYPECTOMY;  Surgeon: Daneil Dolin, MD;  Location: [facility];  Service: Endoscopy;;  cecal and sigmoid polypectomies    Social History   Socioeconomic History   Marital status: Divorced    Spouse name: Not on file   Number of children: Not on file   Years of education: Not on file   Highest education level: Not on file  Occupational History   Not on file  Tobacco Use   Smoking status: Never   Smokeless tobacco: Never  Substance  and Sexual Activity   Alcohol use: Yes    Comment: occ   Drug use: No   Sexual activity: Yes    Birth control/protection: None, Post-menopausal  Other Topics Concern   Not on file  Social History Narrative   Not on file   Social Determinants of Health   Financial Resource Strain: Not on file  Food Insecurity: Not on file  Transportation Needs: Not on file  Physical Activity: Not on file  Stress: Not on file  Social Connections: Not on file    Family History  Problem Relation Age of Onset   Hypertension Father    Diabetes Father    Hypertension Mother    Colon polyps Mother    Hypertension Sister    Hyperlipidemia Sister    Colon polyps Brother     Outpatient Encounter Medications as of 09/06/2021  Medication Sig   ACCU-CHEK GUIDE test strip CHECK BLOOD SUGAR ONCE  DAILY   albuterol (VENTOLIN HFA) 108 (90 Base) MCG/ACT inhaler Inhale 1-2 puffs into the lungs every 6 (six) hours as needed for wheezing or  shortness of breath.   atorvastatin (LIPITOR) 20 MG tablet Take 1 tablet (20 mg total) by mouth daily.   blood glucose meter kit and supplies KIT Dispense based on patient and insurance preference. Tests sugar once a day. DX E11.9   budesonide-formoterol (SYMBICORT) 160-4.5 MCG/ACT inhaler Inhale 2 puffs into the lungs 2 (two) times daily. (Patient taking differently: Inhale 2 puffs into the lungs 2 (two) times daily. AS NEEDED)   busPIRone (BUSPAR) 5 MG tablet TAKE (1) TABLET BY MOUTH (3) TIMES DAILY AS NEEDED FOR ANXIETY   Cholecalciferol (VITAMIN D) 125 MCG (5000 UT) CAPS Take 5,000 Units by mouth daily with breakfast.   Cyanocobalamin (VITAMIN B-12) 5000 MCG SUBL Place 1 tablet under the tongue daily.   dicyclomine (BENTYL) 10 MG capsule Take 1 capsule (10 mg total) by mouth 3 (three) times daily as needed for spasms. As needed for loose stool.   escitalopram (LEXAPRO) 20 MG tablet TAKE 1 TABLET BY MOUTH  DAILY NEEDS OFFICE VISIT   fluticasone (FLONASE) 50 MCG/ACT nasal  spray Place 2 sprays into both nostrils daily. (Patient taking differently: Place 2 sprays into both nostrils daily. As needed)   gabapentin (NEURONTIN) 300 MG capsule TAKE 1 CAPSULE BY MOUTH 3  TIMES DAILY (Patient taking differently: TAKE 1 CAPSULE BY MOUTH 3  TIMES DAILY as needed)   hydrochlorothiazide (HYDRODIURIL) 25 MG tablet TAKE 1 TABLET BY MOUTH ONCE DAILY AS NEEDED   loratadine (CLARITIN) 10 MG tablet Take 10 mg by mouth daily.   losartan (COZAAR) 50 MG tablet TAKE 1 TABLET BY MOUTH  DAILY   Microlet Lancets MISC USE TO TEST BLOOD SUGAR  ONCE DAILY   omeprazole (PRILOSEC) 40 MG capsule Take 1 capsule (40 mg total) by mouth daily.   [DISCONTINUED] Cholecalciferol (VITAMIN D) 125 MCG (5000 UT) CAPS Take 5,000 Units by mouth daily with breakfast.   [DISCONTINUED] metFORMIN (GLUCOPHAGE-XR) 500 MG 24 hr tablet Take 1 tablet (500 mg total) by mouth daily with breakfast.   No facility-administered encounter medications on file as of 09/06/2021.    ALLERGIES: Allergies  Allergen Reactions   Sulfa Antibiotics Hives, Diarrhea and Itching   Other     SURGICAL TAPE Reaction: rash    Penicillins Hives, Itching and Rash    Has patient had a PCN reaction causing immediate rash, facial/tongue/throat swelling, SOB or lightheadedness with hypotension: Yes Has patient had a PCN reaction causing severe rash involving mucus membranes or skin necrosis: No Has patient had a PCN reaction that required hospitalization: No Has patient had a PCN reaction occurring within the last 10 years: Yes If all of the above answers are "NO", then may proceed with Cephalosporin use.    VACCINATION STATUS: Immunization History  Administered Date(s) Administered   Influenza,inj,Quad PF,6+ Mos 05/31/2018   Influenza-Unspecified 05/23/2011   Moderna Sars-Covid-2 Vaccination 11/02/2019, 12/04/2019, 08/06/2020   Pneumococcal Polysaccharide-23 08/20/2018    Diabetes She presents for her follow-up diabetic visit.  She has type 2 diabetes mellitus. Onset time: She was diagnosed at approximate age of 60 years. Her disease course has been stable. There are no hypoglycemic associated symptoms. Pertinent negatives for hypoglycemia include no confusion, headaches, pallor or seizures. There are no diabetic associated symptoms. Pertinent negatives for diabetes include no chest pain, no polydipsia, no polyphagia and no polyuria. There are no hypoglycemic complications. Symptoms are stable. There are no diabetic complications. Risk factors for coronary artery disease include diabetes mellitus, dyslipidemia, family history, hypertension, post-menopausal and sedentary lifestyle. Current diabetic treatment  includes oral agent (monotherapy). Her weight is stable. She is following a generally unhealthy diet. When asked about meal planning, she reported none. She has not had a previous visit with a dietitian. She rarely participates in exercise. (She presents with controlled glycemic profile and A1c of 5.9%.  She complains of side effects from metformin.  ) An ACE inhibitor/angiotensin II receptor blocker is being taken. She does not see a podiatrist.Eye exam is current.  Hyperlipidemia This is a chronic problem. The current episode started more than 1 year ago. The problem is uncontrolled. Recent lipid tests were reviewed and are variable. Exacerbating diseases include diabetes. Factors aggravating her hyperlipidemia include fatty foods. Pertinent negatives include no chest pain, myalgias or shortness of breath. Current antihyperlipidemic treatment includes statins. Risk factors for coronary artery disease include dyslipidemia, diabetes mellitus, hypertension, a sedentary lifestyle and post-menopausal.  Hypertension This is a chronic problem. The current episode started more than 1 year ago. The problem has been resolved since onset. The problem is controlled. Pertinent negatives include no chest pain, headaches, palpitations or  shortness of breath. Risk factors for coronary artery disease include dyslipidemia, diabetes mellitus, family history, sedentary lifestyle and post-menopausal state. Past treatments include diuretics and angiotensin blockers. The current treatment provides moderate improvement. There are no compliance problems.     Review of systems  Constitutional: + Minimally fluctuating body weight,  current Body mass index is 28.12 kg/m. , no fatigue, no subjective hyperthermia, no subjective hypothermia  Respiratory: no cough, no shortness of breath Gastrointestinal: no nausea/vomiting, + diarrhea   Objective:    BP 126/68    Pulse 84    Ht _0  (1.676 m)    Wt 174 lb 3.2 oz (79 kg)    LMP 09/03/2013    BMI 28.12 kg/m   Wt Readings from Last 3 Encounters:  09/06/21 175 lb (79.4 kg)  09/06/21 174 lb 3.2 oz (79 kg)  07/30/21 174 lb 9.6 oz (79.2 kg)     BP Readings from Last 3 Encounters:  09/06/21 126/68  07/30/21 (!) 164/70  04/22/21 (!) 145/75     Physical Exam- Limited  Constitutional:  Body mass index is 28.12 kg/m. , not in acute distress, normal state of mind     CMP     Component Value Date/Time   NA 139 09/02/2021 1210   K 4.2 09/02/2021 1210   CL 96 09/02/2021 1210   CO2 25 09/02/2021 1210   GLUCOSE 151 (H) 09/02/2021 1210   GLUCOSE 185 (H) 12/23/2019 1036   BUN 8 09/02/2021 1210   CREATININE 0.79 09/02/2021 1210   CREATININE 1.03 12/23/2019 1036   CALCIUM 10.0 09/02/2021 1210   PROT 8.4 09/02/2021 1210   ALBUMIN 4.9 09/02/2021 1210   AST 34 09/02/2021 1210   ALT 29 09/02/2021 1210   ALKPHOS 105 09/02/2021 1210   BILITOT 0.6 09/02/2021 1210   GFRNONAA 60 12/23/2019 1036   GFRAA 69 12/23/2019 1036     Diabetic Labs (most recent): Lab Results  Component Value Date   HGBA1C 5.9 09/06/2021   HGBA1C 5.5 03/03/2021   HGBA1C 5.9 08/31/2020     Lipid Panel ( most recent) Lipid Panel     Component Value Date/Time   CHOL 174 03/01/2021 1143   TRIG 351 (H)  03/01/2021 1143   HDL 45 03/01/2021 1143   CHOLHDL 3.9 03/01/2021 1143   CHOLHDL 3.9 09/10/2014 0805   VLDL 25 09/10/2014 0805   LDLCALC 73 03/01/2021 1143   LABVLDL  56 (H) 03/01/2021 1143     Assessment & Plan:   1) Type 2 diabetes mellitus with hyperglycemia, without long-term current use of insulin (HCC)  - Annalicia Renfrew has currently uncontrolled symptomatic type 2 DM since  61 years of age.  She presents with controlled glycemic profile and A1c of 5.9%.  She complains of side effects from metformin.     Recent labs reviewed.  - I had a long discussion with her about the progressive nature of diabetes and the pathology behind its complications. -She does not report any gross complications from her diabetes, however, she remains at a high risk for more acute and chronic complications which include CAD, CVA, CKD, retinopathy, and neuropathy. These are all discussed in detail with her.  - Nutritional counseling repeated at each appointment due to patients tendency to fall back in to old habits.  - she acknowledges that there is a room for improvement in her food and drink choices. - Suggestion is made for her to avoid simple carbohydrates  from her diet including Cakes, Sweet Desserts, Ice Cream, Soda (diet and regular), Sweet Tea, Candies, Chips, Cookies, Store Bought Juices, Alcohol , Artificial Sweeteners,  Coffee Creamer, and "Sugar-free" Products, Lemonade. This will help patient to have more stable blood glucose profile and potentially avoid unintended weight gain.  The following Lifestyle Medicine recommendations according to [facility]) were discussed and and offered to patient and she  agrees to start the journey:  A. Whole Foods, Plant-Based Nutrition comprising of fruits and vegetables, plant-based proteins, whole-grain carbohydrates was discussed in detail with the patient.   A list for source of those nutrients were also provided to the  patient.  Patient will use only water or unsweetened tea for hydration. B.  The need to stay away from risky substances including alcohol, smoking; obtaining 7 to 9 hours of restorative sleep, at least 150 minutes of moderate intensity exercise weekly, the importance of healthy social connections,  and stress management techniques were discussed.   - I encouraged the patient to switch to unprocessed or minimally processed complex starch and increased protein intake (animal or plant source), fruits, and vegetables.   - Patient is advised to stick to a routine mealtimes to eat 3 meals a day and avoid unnecessary snacks (to snack only to correct hypoglycemia).  - she has been scheduled with Jearld Fenton, RDN, CDE for diabetes education.  - I have approached her with the following individualized plan to manage  her diabetes and patient agrees:   -In light of her presentation with tightly controlled glycemic profile and point of care A1c of 5.9%, she is advised to hold off metformin to observe her GI symptoms until next measurement of A1c.  She is advised to monitor blood glucose at least once a day before breakfast and report if blood glucose profile gets out of control greater than 200 mg/day.   - she will be considered for incretin or glipizide therapy as appropriate next visit.  - Specific targets for  A1c;  LDL, HDL,  and Triglycerides were discussed with the patient.  2) Blood Pressure /Hypertension:  -Her blood pressure is controlled to target.  she is advised to continue her current medications including HCTZ 25 mg p.o. daily with breakfast .  She is also on Losartan 50 mg p.o. daily.  3) Lipids/Hyperlipidemia:    Review of her recent lipid panel from 03/01/21 showed controlled LDL at 73 and elevated triglycerides of  351 .  she is advised to continue Atorvastatin 20 mg p.o. daily at bedtime.  Side effects and precautions discussed with her.  She is also advised to avoid fried foods and  butter.  She has elevated ferritin level, will need a repeat test.  4)  Weight/Diet:   Her Body mass index is 28.12 kg/m.  -   she is a candidate for modest weight loss. I discussed with her the fact that loss of 5 - 10% of her  current body weight will have the most impact on her diabetes management.  Exercise, and detailed carbohydrates information provided  -  detailed on discharge instructions.  5) Chronic Care/Health Maintenance: -she  is on ARB and statin medications and  is encouraged to initiate and continue to follow up with Ophthalmology, Dentist,  Podiatrist at least yearly or according to recommendations, and advised to stay away from smoking. I have recommended yearly flu vaccine and pneumonia vaccine at least every 5 years; moderate intensity exercise for up to 150 minutes weekly; and  sleep for at least 7 hours a day.   - she is  advised to maintain close follow up with Lindell Spar, MD for primary care needs, as well as her other providers for optimal and coordinated care.   I spent 32 minutes in the care of the patient today including review of labs from [facility], Lipids, Thyroid Function, Hematology (current and previous including abstractions from other facilities); face-to-face time discussing  her blood glucose readings/logs, discussing hypoglycemia and hyperglycemia episodes and symptoms, medications doses, her options of short and long term treatment based on the latest standards of care / guidelines;  discussion about incorporating lifestyle medicine;  and documenting the encounter.    Please refer to Patient Instructions for Blood Glucose Monitoring and Insulin/Medications Dosing Guide"  in media tab for additional information. Please  also refer to " Patient Self Inventory" in the Media  tab for reviewed elements of pertinent patient history.  Sheppard Penton participated in the discussions, expressed understanding, and voiced agreement with the above plans.  All questions  were answered to her satisfaction. she is encouraged to contact clinic should she have any questions or concerns prior to her return visit.  Follow up plan: - Return in about 3 months (around 12/05/2021) for A1c -NV, F/U with Pre-visit Labs.  Rayetta Pigg, [facility] [address] [place], [place] [address] Phone: [phone] Fax: [phone]  09/06/2021, 6:03 PM

## 2021-09-06 NOTE — Patient Instructions (Signed)

## 2021-09-06 NOTE — Progress Notes (Signed)
°  Medical Nutrition Therapy:  Appt start time:  1500 end time:  5093   Assessment:  Primary concerns today: Diabetes Type 2.  Follow up  Saw  Dr. Dorris Scott today in office today. A1C up to 5.9% from 5.5%. Changes made:  Dr Laura Scott is taking her off her Metformin. Has cut out beer and only drinking a glass of wine when she wants some alcohol.  Lab Results  Component Value Date   HGBA1C 5.9 09/06/2021    Motivated to continue to improve [name] and lose some weight. Wt Readings from Last 3 Encounters:  07/30/21 174 lb 9.6 oz (79.2 kg)  04/22/21 168 lb (76.2 kg)  03/03/21 174 lb (78.9 kg)   Ht Readings from Last 3 Encounters:  07/30/21 5\' 6"  (1.676 m)  04/22/21 5\' 6"  (1.676 m)  03/03/21 5\' 6"  (1.676 m)   There is no height or weight on file to calculate BMI. @BMIFA @ Facility age limit for growth percentiles is 20 years. Facility age limit for growth percentiles is 20 years.   Lab Results  Component Value Date   HGBA1C 5.5 03/03/2021   CMP Latest Ref Rng & Units 09/02/2021 03/01/2021 12/23/2019  Glucose 70 - 99 mg/dL 151(H) 130(H) 185(H)  BUN 8 - 27 mg/dL 8 5(L) 12  Creatinine 0.57 - 1.00 mg/dL 0.79 0.93 1.03  Sodium 134 - 144 mmol/L 139 142 140  Potassium 3.5 - 5.2 mmol/L 4.2 4.3 3.8  Chloride 96 - 106 mmol/L 96 101 101  CO2 20 - 29 mmol/L 25 25 30   Calcium 8.7 - 10.3 mg/dL 10.0 9.2 9.6  Total Protein 6.0 - 8.5 g/dL 8.4 7.1 7.2  Total Bilirubin 0.0 - 1.2 mg/dL 0.6 0.4 0.8  Alkaline Phos 44 - 121 IU/L 105 86 -  AST 0 - 40 IU/L 34 59(H) 24  ALT 0 - 32 IU/L 29 53(H) 25      Preferred Learning Style:  No preference indicated   Learning Readiness:  Ready Change in progress   MEDICATIONS:   DIETARY INTAKE:   24-hr recall:  B oatmeal, grits or sausage biscuit. L) fish, coleslaw, baked beans, water, corn on cob. D) Baked fish, sweet potato and greens, water  Usual physical activity: walks some or goes to GYM.  Estimated energy needs: 1500 calories 170 g  carbohydrates 112 g protein 42 g fat  Progress Towards Goal(s):  In progress.   Nutritional Diagnosis:  NB-1.1 Food and nutrition-related knowledge deficit As related to Diabetes Type 2.  As evidenced by A1C 12%.    Intervention:  Nutrition and Diabetes education provided on My Plate, CHO counting, meal planning, portion sizes, timing of meals, avoiding snacks between meals unless having a low blood sugar, target ranges for A1C and blood sugars, signs/symptoms and treatment of hyper/hypoglycemia, monitoring blood sugars, taking medications as prescribed, benefits of exercising 30 minutes per day and prevention of complications of DM.  Goals set by patient Goals  Focus on more plant based foods Eat meals  on time.  Try to get up earlier Walk or exercise 60 mins three times per week.  Cut out processed foods  Teaching Method Utilized:  Visual Auditory Hands on  Handouts given during visit include: Plant based meal plan   Barriers to learning/adherence to lifestyle change: none  Demonstrated degree of understanding via:  Teach Back   Monitoring/Evaluation:  Dietary intake, exercise, , and body weight in 6 month(s).  Marland Kitchen

## 2021-09-07 ENCOUNTER — Other Ambulatory Visit: Payer: Self-pay

## 2021-09-07 DIAGNOSIS — E1165 Type 2 diabetes mellitus with hyperglycemia: Secondary | ICD-10-CM

## 2021-09-14 ENCOUNTER — Telehealth: Payer: Self-pay | Admitting: "Endocrinology

## 2021-09-14 ENCOUNTER — Other Ambulatory Visit: Payer: Self-pay

## 2021-09-14 ENCOUNTER — Ambulatory Visit (INDEPENDENT_AMBULATORY_CARE_PROVIDER_SITE_OTHER): Payer: No Typology Code available for payment source | Admitting: Nurse Practitioner

## 2021-09-14 ENCOUNTER — Encounter: Payer: Self-pay | Admitting: Nurse Practitioner

## 2021-09-14 VITALS — BP 160/78 | HR 110 | Resp 17 | Ht 66.0 in | Wt 169.0 lb

## 2021-09-14 DIAGNOSIS — E559 Vitamin D deficiency, unspecified: Secondary | ICD-10-CM | POA: Diagnosis not present

## 2021-09-14 DIAGNOSIS — F419 Anxiety disorder, unspecified: Secondary | ICD-10-CM

## 2021-09-14 DIAGNOSIS — R1013 Epigastric pain: Secondary | ICD-10-CM | POA: Diagnosis not present

## 2021-09-14 DIAGNOSIS — E1165 Type 2 diabetes mellitus with hyperglycemia: Secondary | ICD-10-CM

## 2021-09-14 DIAGNOSIS — M25511 Pain in right shoulder: Secondary | ICD-10-CM

## 2021-09-14 DIAGNOSIS — R768 Other specified abnormal immunological findings in serum: Secondary | ICD-10-CM

## 2021-09-14 DIAGNOSIS — T7840XD Allergy, unspecified, subsequent encounter: Secondary | ICD-10-CM

## 2021-09-14 DIAGNOSIS — I10 Essential (primary) hypertension: Secondary | ICD-10-CM | POA: Diagnosis not present

## 2021-09-14 DIAGNOSIS — F32A Depression, unspecified: Secondary | ICD-10-CM

## 2021-09-14 DIAGNOSIS — M898X1 Other specified disorders of bone, shoulder: Secondary | ICD-10-CM | POA: Insufficient documentation

## 2021-09-14 DIAGNOSIS — T7840XA Allergy, unspecified, initial encounter: Secondary | ICD-10-CM | POA: Insufficient documentation

## 2021-09-14 MED ORDER — IBUPROFEN 600 MG PO TABS: 600.0000 mg | ORAL_TABLET | Freq: Two times a day (BID) | ORAL | 0 refills | Status: DC | PRN

## 2021-09-14 NOTE — Assessment & Plan Note (Signed)
Recently had a positive ANA.  Patient referred to rheumatology to rule out rheumatoid arthritis.  Lab Results  Component Value Date   ANA Positive (A) 09/02/2021  Take ibuprofen 600 mg 2 times daily as needed

## 2021-09-14 NOTE — Assessment & Plan Note (Signed)
Well-controlled on Flonase nasal spray and Claritin 10 mg daily.

## 2021-09-14 NOTE — Assessment & Plan Note (Signed)
Followed by GI.  Takes omeprazole 40 mg daily.

## 2021-09-14 NOTE — Patient Instructions (Signed)
Please get your shingles and TDAP vaccine at your pharmacy.  Take ibuprofen 600mg  two times daily as needed for your shoulder pain.    It is important that you exercise regularly at least 30 minutes 5 times a week.  Think about what you will eat, plan ahead. Choose " clean, green, fresh or frozen" over canned, processed or packaged foods which are more sugary, salty and fatty. 70 to 75% of food eaten should be vegetables and fruit. Three meals at set times with snacks allowed between meals, but they must be fruit or vegetables. Aim to eat over a 12 hour period , example 7 am to 7 pm, and STOP after  your last meal of the day. Drink water,generally about 64 ounces per day, no other drink is as healthy. Fruit juice is best enjoyed in a healthy way, by EATING the fruit.  Thanks for choosing [facility], we consider it a privelige to serve you.

## 2021-09-14 NOTE — Assessment & Plan Note (Signed)
Take ibuprofen 600 mg 2 times daily as needed.  Patient referred to rheumatology due to positive ANA.

## 2021-09-14 NOTE — Assessment & Plan Note (Signed)
DASH diet and commitment to daily physical activity for a minimum of 30 minutes discussed and encouraged, as a part of hypertension management. The importance of attaining a healthy weight is also discussed.  BP/Weight 09/14/2021 09/06/2021 09/06/2021 07/30/2021 04/22/2021 03/03/2021 2/41/1464  Systolic BP 314 - 276 701 100 349 611  Diastolic BP 78 - 68 70 75 78 80  Wt. (Lbs) 169 175 174.2 174.6 168 174 177  BMI 27.28 28.68 28.12 28.18 27.12 28.08 28.57  could be high due to her anxiety about thoughts of having bone cancer Patient told to monitor BP at home keep a rbp log  and bring to next appointment Continue hydrochlorothiazide 25 mg daily, losartan 50 mg daily.

## 2021-09-14 NOTE — Assessment & Plan Note (Signed)
Patient referred to rheumatology.  Lab Results  Component Value Date   ANA Positive (A) 09/02/2021

## 2021-09-14 NOTE — Assessment & Plan Note (Addendum)
Takes BuSpar 5 mg daily, Lexapro 20 mg daily.  Patient states that both medications are not helping her depression.  Patient referred to psych([facility]) Denies suicidal ideation or homicidal ideation.  PHQ 9 score 16.

## 2021-09-14 NOTE — Assessment & Plan Note (Signed)
Takes vitamin D 5000 units daily

## 2021-09-14 NOTE — Progress Notes (Signed)
New Patient Office Visit  Subjective:  Patient ID: Laura Scott, female    DOB: 1961-04-21  Age: 61 y.o. MRN: [id number]  CC:  Chief Complaint  Patient presents with   Establish Care   Cyst    In chest and chest is sore Pt noticed it last week   Back Pain    Pt is also hurting in upper arm and back    HPI [name] presents to establish care. Was previously going to [facility].   DM . Not taking medication at this time, followed by Dr Dorris Fetch,  HLD. ATORVASTATIN 20MG DAILY Depression. Takes buspar 36m TID, Lexapro 22mdaily, states med are not working.  Allergies. Flonase as needed.claritin 1064maily.  Neuropathy. States that she takes gabapentin PRN HTN. Hydrochlorothiazide 59m18mosartan 50mg15mly GERD omeprazole 40mg 10my.   Patient stated that she was ordered albuterol inhaler and Symbicort inhaler when she had COVID.  She has not needed both meds recently.  She statexs that she has upcoming apt for mamogram in March, pt will call [facility] to [facility] smear.   Due for shingles , TDAP, diabetic eye, foot exam.   Upto date with COVID and flu vaccines.   Pt c/o right  shoulder, right collar bone swelling and pain. , denies trauma. Pain started gradually last week. Pain is aching 6/10. She has tried ibuprofen OTC, they did not help her symptoms. Denies fever, chills, site feels warm . She has never had this problem before. Sttaes that sh is worried that it is bone cancer.   Past Medical History:  Diagnosis Date   Allergic rhinitis    Anxiety    Chronic pain    Depression    Diabetes mellitus without complication (HCC)    GERD (gastroesophageal reflux disease)    Headache    Hyperlipidemia    Hypertension    IBS (irritable bowel syndrome)    PMS (premenstrual syndrome)    Reflux     Past Surgical History:  Procedure Laterality Date   COLONOSCOPY  08/29/2011   [name]: normal. high risk screening tcs in 5 years.    COLONOSCOPY  WITH PROPOFOL N/A 10/20/2016   Procedure: COLONOSCOPY WITH PROPOFOL;  Surgeon: RobertDaneil Dolin [facility];  Service: Endoscopy;  Laterality: N/A;  10:45am   ESOPHAGOGASTRODUODENOSCOPY (EGD) WITH PROPOFOL N/A 10/20/2016   Procedure: ESOPHAGOGASTRODUODENOSCOPY (EGD) WITH PROPOFOL;  Surgeon: RobertDaneil Dolin [facility];  Service: Endoscopy;  Laterality: N/A;   [place]   Procedure: MALONEVenia MinksION;  Surgeon: RobertDaneil Dolin [facility];  Service: Endoscopy;  Laterality: N/A;   POLYPECTOMY  10/20/2016   Procedure: POLYPECTOMY;  Surgeon: RobertDaneil Dolin [facility];  Service: Endoscopy;;  cecal and sigmoid polypectomies    Family History  Problem Relation Age of Onset   Hypertension Father    Diabetes Father    Hypertension Mother    Colon polyps Mother    Hypertension Sister    Hyperlipidemia Sister    Colon polyps Brother     Social History   Socioeconomic History   Marital status: Divorced    Spouse name: Not on file   Number of children: Not on file   Years of education: Not on file   Highest education level: Not on file  Occupational History   Not on file  Tobacco  Use   Smoking status: Never   Smokeless tobacco: Never  Substance and Sexual Activity   Alcohol use: Yes    Comment: occ   Drug use: No   Sexual activity: Yes    Birth control/protection: None, Post-menopausal  Other Topics Concern   Not on file  Social History Narrative   Not on file   Social Determinants of Health   Financial Resource Strain: Not on file  Food Insecurity: Not on file  Transportation Needs: Not on file  Physical Activity: Not on file  Stress: Not on file  Social Connections: Not on file  Intimate Partner Violence: Not on file    ROS Review of Systems  Constitutional: Negative.   Respiratory: Negative.    Cardiovascular: Negative.   Musculoskeletal:  Positive for joint  swelling.       Right shoulder  and right collar bone pain and sweeling  Psychiatric/Behavioral: Negative.     Objective:   Today's Vitals: BP (!) 197/95    Pulse (!) 110    Resp 17    Ht _0  (1.676 m)    Wt 169 lb (76.7 kg)    LMP 09/03/2013    SpO2 97%    BMI 27.28 kg/m   Physical Exam Constitutional:      General: She is not in acute distress.    Appearance: She is not ill-appearing, toxic-appearing or diaphoretic.  Cardiovascular:     Rate and Rhythm: Normal rate and regular rhythm.     Pulses: Normal pulses.     Heart sounds: Normal heart sounds. No murmur heard.   No friction rub. No gallop.  Pulmonary:     Effort: No respiratory distress.     Breath sounds: No stridor. No wheezing, rhonchi or rales.  Chest:     Chest wall: No tenderness.  Musculoskeletal:        General: No swelling.     Right shoulder: Tenderness present.     Cervical back: No rigidity or tenderness.     Comments: Right clavicle and right collar bone tenderness on palpation, able to do ROM of shoulder.  Right collar bone more prominent than the left, voiced tenderness on palpation,no redness noted, skin warm and dry .   Lymphadenopathy:     Cervical: No cervical adenopathy.  Neurological:     Mental Status: She is alert.  Psychiatric:        Mood and Affect: Mood normal.        Behavior: Behavior normal.        Thought Content: Thought content normal.        Judgment: Judgment normal.    Assessment & Plan:   Problem List Items Addressed This Visit   None   Outpatient Encounter Medications as of 09/14/2021  Medication Sig   ACCU-CHEK GUIDE test strip CHECK BLOOD SUGAR ONCE  DAILY   albuterol (VENTOLIN HFA) 108 (90 Base) MCG/ACT inhaler Inhale 1-2 puffs into the lungs every 6 (six) hours as needed for wheezing or shortness of breath.   atorvastatin (LIPITOR) 20 MG tablet Take 1 tablet (20 mg total) by mouth daily.   blood glucose meter kit and supplies KIT Dispense based on patient and  insurance preference. Tests sugar once a day. DX E11.9   budesonide-formoterol (SYMBICORT) 160-4.5 MCG/ACT inhaler Inhale 2 puffs into the lungs 2 (two) times daily. (Patient taking differently: Inhale 2 puffs into the lungs 2 (two) times daily. AS NEEDED)   busPIRone (BUSPAR) 5 MG tablet TAKE (  1) TABLET BY MOUTH (3) TIMES DAILY AS NEEDED FOR ANXIETY   Cholecalciferol (VITAMIN D) 125 MCG (5000 UT) CAPS Take 5,000 Units by mouth daily with breakfast.   Cyanocobalamin (VITAMIN B-12) 5000 MCG SUBL Place 1 tablet under the tongue daily.   dicyclomine (BENTYL) 10 MG capsule Take 1 capsule (10 mg total) by mouth 3 (three) times daily as needed for spasms. As needed for loose stool.   escitalopram (LEXAPRO) 20 MG tablet TAKE 1 TABLET BY MOUTH  DAILY NEEDS OFFICE VISIT   fluticasone (FLONASE) 50 MCG/ACT nasal spray Place 2 sprays into both nostrils daily. (Patient taking differently: Place 2 sprays into both nostrils daily. As needed)   gabapentin (NEURONTIN) 300 MG capsule TAKE 1 CAPSULE BY MOUTH 3  TIMES DAILY (Patient taking differently: TAKE 1 CAPSULE BY MOUTH 3  TIMES DAILY as needed)   hydrochlorothiazide (HYDRODIURIL) 25 MG tablet TAKE 1 TABLET BY MOUTH ONCE DAILY AS NEEDED   loratadine (CLARITIN) 10 MG tablet Take 10 mg by mouth daily.   losartan (COZAAR) 50 MG tablet TAKE 1 TABLET BY MOUTH  DAILY   Microlet Lancets MISC USE TO TEST BLOOD SUGAR  ONCE DAILY   omeprazole (PRILOSEC) 40 MG capsule Take 1 capsule (40 mg total) by mouth daily.   No facility-administered encounter medications on file as of 09/14/2021.    Follow-up: No follow-ups on file.   Renee Rival, FNP

## 2021-09-14 NOTE — Assessment & Plan Note (Signed)
Lab Results  Component Value Date   HGBA1C 5.9 09/06/2021  Followed by Dr. Dorris Fetch  States that she was recently taken off metfromin diabetic eye exam ordered today, takes atorvastatin 20mg  daily.

## 2021-09-14 NOTE — Telephone Encounter (Signed)
Patient states that she went to get her Vitamin D from [place] and they stated they do not have it. It looks like it was sent in on 1/16. Can you re send?

## 2021-09-15 NOTE — Telephone Encounter (Signed)
[facility] and they stated that they do not have this as a prescription because this dosage is not covered by insurance and this is over the counter and they will call the patient to explain.  I called the patient to let her know and she stated that she already has this over the counter Vitamin D 5000 IU and is taking one a day.

## 2021-10-11 ENCOUNTER — Other Ambulatory Visit: Payer: Self-pay

## 2021-10-11 DIAGNOSIS — R7989 Other specified abnormal findings of blood chemistry: Secondary | ICD-10-CM

## 2021-10-19 ENCOUNTER — Ambulatory Visit: Payer: No Typology Code available for payment source

## 2021-10-22 ENCOUNTER — Other Ambulatory Visit: Payer: Self-pay | Admitting: *Deleted

## 2021-10-22 DIAGNOSIS — R7989 Other specified abnormal findings of blood chemistry: Secondary | ICD-10-CM

## 2021-10-28 ENCOUNTER — Encounter: Payer: Self-pay | Admitting: *Deleted

## 2021-10-28 ENCOUNTER — Other Ambulatory Visit: Payer: Self-pay

## 2021-10-28 ENCOUNTER — Ambulatory Visit
Admission: EM | Admit: 2021-10-28 | Discharge: 2021-10-28 | Disposition: A | Discharge status: Home / Self Care | Payer: No Typology Code available for payment source | Attending: Urgent Care | Admitting: Urgent Care

## 2021-10-28 DIAGNOSIS — R053 Chronic cough: Secondary | ICD-10-CM | POA: Diagnosis not present

## 2021-10-28 DIAGNOSIS — E119 Type 2 diabetes mellitus without complications: Secondary | ICD-10-CM | POA: Diagnosis not present

## 2021-10-28 DIAGNOSIS — J019 Acute sinusitis, unspecified: Secondary | ICD-10-CM

## 2021-10-28 DIAGNOSIS — J309 Allergic rhinitis, unspecified: Secondary | ICD-10-CM

## 2021-10-28 MED ORDER — DOXYCYCLINE HYCLATE 100 MG PO CAPS: 100.0000 mg | ORAL_CAPSULE | Freq: Two times a day (BID) | ORAL | 0 refills | Status: DC

## 2021-10-28 MED ORDER — BENZONATATE 100 MG PO CAPS: 100.0000 mg | ORAL_CAPSULE | Freq: Three times a day (TID) | ORAL | 0 refills | Status: DC | PRN

## 2021-10-28 MED ORDER — LEVOCETIRIZINE DIHYDROCHLORIDE 5 MG PO TABS: 5.0000 mg | ORAL_TABLET | Freq: Every evening | ORAL | 0 refills | Status: DC

## 2021-10-28 MED ORDER — PROMETHAZINE-DM 6.25-15 MG/5ML PO SYRP: 5.0000 mL | ORAL_SOLUTION | Freq: Every evening | ORAL | 0 refills | Status: DC | PRN

## 2021-10-28 MED ORDER — PREDNISONE 20 MG PO TABS: ORAL_TABLET | ORAL | 0 refills | Status: DC

## 2021-10-28 NOTE — ED Provider Notes (Signed)
[facility]   MRN: [id number] DOB: 11/04/1960  Subjective:   Laura Scott is a 61 y.o. female presenting for 3-week history of persistent sinus congestion, sinus pressure, bilateral ear fullness, persistent coughing.  Cough is worse at night.  No chest pain, shortness of breath or wheezing.  Patient has a history of chronic allergic rhinitis.  She has been taking Zyrtec for years but is not working anymore.  She has done a COVID test that has been negative.  Does not want a repeat.  She does have a history of type 2 diabetes and is treated without insulin.  No current facility-administered medications for this encounter.  Current Outpatient Medications:    ACCU-CHEK GUIDE test strip, CHECK BLOOD SUGAR ONCE  DAILY, Disp: 100 strip, Rfl: 3   albuterol (VENTOLIN HFA) 108 (90 Base) MCG/ACT inhaler, Inhale 1-2 puffs into the lungs every 6 (six) hours as needed for wheezing or shortness of breath. (Patient not taking: Reported on 09/14/2021), Disp: 18 g, Rfl: 0   atorvastatin (LIPITOR) 20 MG tablet, Take 1 tablet (20 mg total) by mouth daily., Disp: 90 tablet, Rfl: 1   blood glucose meter kit and supplies KIT, Dispense based on patient and insurance preference. Tests sugar once a day. DX E11.9, Disp: 1 each, Rfl: 0   budesonide-formoterol (SYMBICORT) 160-4.5 MCG/ACT inhaler, Inhale 2 puffs into the lungs 2 (two) times daily. (Patient not taking: Reported on 09/14/2021), Disp: 1 Inhaler, Rfl: 5   busPIRone (BUSPAR) 5 MG tablet, TAKE (1) TABLET BY MOUTH (3) TIMES DAILY AS NEEDED FOR ANXIETY, Disp: 90 tablet, Rfl: 5   Cholecalciferol (VITAMIN D) 125 MCG (5000 UT) CAPS, Take 5,000 Units by mouth daily with breakfast., Disp: 30 capsule, Rfl: 2   Cyanocobalamin (VITAMIN B-12) 5000 MCG SUBL, Place 1 tablet under the tongue daily., Disp: , Rfl:    dicyclomine (BENTYL) 10 MG capsule, Take 1 capsule (10 mg total) by mouth 3 (three) times daily as needed for spasms. As needed for loose stool.,  Disp: 90 capsule, Rfl: 1   escitalopram (LEXAPRO) 20 MG tablet, TAKE 1 TABLET BY MOUTH  DAILY NEEDS OFFICE VISIT, Disp: 90 tablet, Rfl: 1   fluticasone (FLONASE) 50 MCG/ACT nasal spray, Place 2 sprays into both nostrils daily. (Patient taking differently: Place 2 sprays into both nostrils daily. As needed), Disp: 48 g, Rfl: 5   gabapentin (NEURONTIN) 300 MG capsule, TAKE 1 CAPSULE BY MOUTH 3  TIMES DAILY (Patient taking differently: TAKE 1 CAPSULE BY MOUTH 3  TIMES DAILY as needed), Disp: 180 capsule, Rfl: 1   hydrochlorothiazide (HYDRODIURIL) 25 MG tablet, TAKE 1 TABLET BY MOUTH ONCE DAILY AS NEEDED, Disp: 90 tablet, Rfl: 1   ibuprofen (ADVIL) 600 MG tablet, Take 1 tablet (600 mg total) by mouth 2 (two) times daily as needed., Disp: 30 tablet, Rfl: 0   loratadine (CLARITIN) 10 MG tablet, Take 10 mg by mouth daily., Disp: , Rfl:    losartan (COZAAR) 50 MG tablet, TAKE 1 TABLET BY MOUTH  DAILY, Disp: 90 tablet, Rfl: 0   Microlet Lancets MISC, USE TO TEST BLOOD SUGAR  ONCE DAILY, Disp: 100 each, Rfl: 3   omeprazole (PRILOSEC) 40 MG capsule, Take 1 capsule (40 mg total) by mouth daily., Disp: 90 capsule, Rfl: 1   Allergies  Allergen Reactions   Sulfa Antibiotics Hives, Diarrhea and Itching   Other     SURGICAL TAPE Reaction: rash    Penicillins Hives, Itching and Rash    Pt states  that she is able to take PCN  Has patient had a PCN reaction causing immediate rash, facial/tongue/throat swelling, SOB or lightheadedness with hypotension: Yes Has patient had a PCN reaction causing severe rash involving mucus membranes or skin necrosis: No Has patient had a PCN reaction that required hospitalization: No Has patient had a PCN reaction occurring within the last 10 years: Yes If all of the above answers are "NO", then may proceed with Cephalosporin use.    Past Medical History:  Diagnosis Date   Allergic rhinitis    Anxiety    Chronic pain    Depression    Diabetes mellitus without complication  (HCC)    GERD (gastroesophageal reflux disease)    Headache    Hyperlipidemia    Hypertension    IBS (irritable bowel syndrome)    PMS (premenstrual syndrome)    Reflux      Past Surgical History:  Procedure Laterality Date   COLONOSCOPY  08/29/2011   [name]: normal. high risk screening tcs in 5 years.    COLONOSCOPY WITH PROPOFOL N/A 10/20/2016   Procedure: COLONOSCOPY WITH PROPOFOL;  Surgeon: Daneil Dolin, MD;  [facility];  Service: Endoscopy;  Laterality: N/A;  10:45am   ESOPHAGOGASTRODUODENOSCOPY (EGD) WITH PROPOFOL N/A 10/20/2016   Procedure: ESOPHAGOGASTRODUODENOSCOPY (EGD) WITH PROPOFOL;  Surgeon: Daneil Dolin, MD;  [facility];  Service: Endoscopy;  Laterality: N/A;   LUMBAR [place] N/A 10/20/2016   Procedure: Venia Minks DILATION;  Surgeon: Daneil Dolin, MD;  [facility];  Service: Endoscopy;  Laterality: N/A;   POLYPECTOMY  10/20/2016   Procedure: POLYPECTOMY;  Surgeon: Daneil Dolin, MD;  [facility];  Service: Endoscopy;;  cecal and sigmoid polypectomies    Family History  Problem Relation Age of Onset   Hypertension Mother    Colon polyps Mother    Hypertension Father    Diabetes Father    Hypertension Sister    Hyperlipidemia Sister    Colon polyps Brother    Diabetes Maternal Aunt    Hypertension Maternal Aunt    Rheum arthritis Maternal Grandmother    Prostate cancer Maternal Grandfather    Cancer Paternal Grandfather        not sure of type of cancer   Breast cancer Neg Hx    Cervical cancer Neg Hx     Social History   Tobacco Use   Smoking status: Never   Smokeless tobacco: Never  Substance Use Topics   Alcohol use: Yes    Comment: occ   Drug use: No    ROS   Objective:   Vitals: BP (!) 161/83    Pulse 79    Temp 98.6 F (37 C)    Resp 18    LMP 09/03/2013    SpO2 96%   Physical Exam Constitutional:      General: She is not in acute distress.    Appearance: Normal  appearance. She is well-developed and normal weight. She is not ill-appearing, toxic-appearing or diaphoretic.  HENT:     Head: Normocephalic and atraumatic.     Right Ear: Tympanic membrane, ear canal and external ear normal. No drainage or tenderness. No middle ear effusion. There is no impacted cerumen. Tympanic membrane is not erythematous.     Left Ear: Tympanic membrane, ear canal and external ear normal. No drainage or tenderness.  No middle ear effusion. There is no impacted cerumen. Tympanic membrane is  not erythematous.     Ears:     Comments: TMs opacified bilaterally.    Nose: Congestion and rhinorrhea present.     Mouth/Throat:     Mouth: Mucous membranes are moist. No oral lesions.     Pharynx: No pharyngeal swelling, oropharyngeal exudate, posterior oropharyngeal erythema or uvula swelling.     Tonsils: No tonsillar exudate or tonsillar abscesses.  Eyes:     General: No scleral icterus.       Right eye: No discharge.        Left eye: No discharge.     Extraocular Movements: Extraocular movements intact.     Right eye: Normal extraocular motion.     Left eye: Normal extraocular motion.     Conjunctiva/sclera: Conjunctivae normal.  Cardiovascular:     Rate and Rhythm: Normal rate.     Heart sounds: No murmur heard.   No friction rub. No gallop.  Pulmonary:     Effort: Pulmonary effort is normal. No respiratory distress.     Breath sounds: No stridor. No wheezing, rhonchi or rales.  Chest:     Chest wall: No tenderness.  Musculoskeletal:     Cervical back: Normal range of motion and neck supple.  Lymphadenopathy:     Cervical: No cervical adenopathy.  Skin:    General: Skin is warm and dry.  Neurological:     General: No focal deficit present.     Mental Status: She is alert and oriented to person, place, and time.  Psychiatric:        Mood and Affect: Mood normal.        Behavior: Behavior normal.    Assessment and Plan :   PDMP not reviewed this  encounter.  1. Acute non-recurrent sinusitis, unspecified location   2. Allergic rhinitis, unspecified seasonality, unspecified trigger   3. Persistent cough   4. Type 2 diabetes mellitus treated without insulin ([place])    Deferred imaging given clear cardiopulmonary exam, hemodynamically stable vital signs.  Given timeline of illness will defer respiratory testing.  As she has allergies to penicillin we will avoid amoxicillin, Augmentin.  Recommended doxycycline.  Use an oral prednisone course in the context of her chronic allergic rhinitis.  Switch from Zyrtec to [place] patient on potential for adverse effects with medications prescribed/recommended today, ER and return-to-clinic precautions discussed, patient verbalized understanding.    Jaynee Eagles, PA-C 10/28/21 1348

## 2021-10-28 NOTE — ED Triage Notes (Signed)
Pt presents with c/o nasal congestion for past 3 weeks  ?

## 2021-10-29 ENCOUNTER — Ambulatory Visit
Admission: RE | Admit: 2021-10-29 | Discharge: 2021-10-29 | Disposition: A | Discharge status: Home / Self Care | Payer: No Typology Code available for payment source | Source: Ambulatory Visit | Attending: Internal Medicine | Admitting: Internal Medicine

## 2021-10-29 DIAGNOSIS — Z1231 Encounter for screening mammogram for malignant neoplasm of breast: Secondary | ICD-10-CM | POA: Diagnosis not present

## 2021-11-12 ENCOUNTER — Encounter: Payer: Self-pay | Admitting: Nurse Practitioner

## 2021-11-12 ENCOUNTER — Ambulatory Visit (INDEPENDENT_AMBULATORY_CARE_PROVIDER_SITE_OTHER): Payer: No Typology Code available for payment source | Admitting: Nurse Practitioner

## 2021-11-12 ENCOUNTER — Other Ambulatory Visit (HOSPITAL_COMMUNITY)
Admission: RE | Admit: 2021-11-12 | Discharge: 2021-11-12 | Disposition: A | Discharge status: Home / Self Care | Payer: No Typology Code available for payment source | Source: Ambulatory Visit | Attending: Nurse Practitioner | Admitting: Nurse Practitioner

## 2021-11-12 ENCOUNTER — Other Ambulatory Visit: Payer: Self-pay

## 2021-11-12 VITALS — BP 132/64 | HR 81 | Ht 65.0 in | Wt 171.0 lb

## 2021-11-12 DIAGNOSIS — G4700 Insomnia, unspecified: Secondary | ICD-10-CM | POA: Diagnosis not present

## 2021-11-12 DIAGNOSIS — E1165 Type 2 diabetes mellitus with hyperglycemia: Secondary | ICD-10-CM

## 2021-11-12 DIAGNOSIS — F419 Anxiety disorder, unspecified: Secondary | ICD-10-CM | POA: Diagnosis not present

## 2021-11-12 DIAGNOSIS — F32A Depression, unspecified: Secondary | ICD-10-CM | POA: Diagnosis not present

## 2021-11-12 DIAGNOSIS — Z1211 Encounter for screening for malignant neoplasm of colon: Secondary | ICD-10-CM | POA: Diagnosis not present

## 2021-11-12 DIAGNOSIS — E782 Mixed hyperlipidemia: Secondary | ICD-10-CM

## 2021-11-12 DIAGNOSIS — Z01419 Encounter for gynecological examination (general) (routine) without abnormal findings: Secondary | ICD-10-CM | POA: Diagnosis not present

## 2021-11-12 DIAGNOSIS — Z0001 Encounter for general adult medical examination with abnormal findings: Secondary | ICD-10-CM | POA: Diagnosis not present

## 2021-11-12 DIAGNOSIS — I10 Essential (primary) hypertension: Secondary | ICD-10-CM

## 2021-11-12 DIAGNOSIS — Z1151 Encounter for screening for human papillomavirus (HPV): Secondary | ICD-10-CM | POA: Diagnosis not present

## 2021-11-12 DIAGNOSIS — Z23 Encounter for immunization: Secondary | ICD-10-CM

## 2021-11-12 DIAGNOSIS — Z0000 Encounter for general adult medical examination without abnormal findings: Secondary | ICD-10-CM

## 2021-11-12 MED ORDER — LOSARTAN POTASSIUM 50 MG PO TABS: 50.0000 mg | ORAL_TABLET | Freq: Every day | ORAL | 0 refills | Status: DC

## 2021-11-12 MED ORDER — HYDROCHLOROTHIAZIDE 25 MG PO TABS: ORAL_TABLET | ORAL | 1 refills | Status: DC

## 2021-11-12 MED ORDER — HYDROXYZINE PAMOATE 25 MG PO CAPS: 25.0000 mg | ORAL_CAPSULE | Freq: Three times a day (TID) | ORAL | 0 refills | Status: DC | PRN

## 2021-11-12 MED ORDER — OMEPRAZOLE 40 MG PO CPDR: 40.0000 mg | DELAYED_RELEASE_CAPSULE | Freq: Every day | ORAL | 1 refills | Status: DC

## 2021-11-12 MED ORDER — BUSPIRONE HCL 5 MG PO TABS: ORAL_TABLET | ORAL | 5 refills | Status: DC

## 2021-11-12 NOTE — Patient Instructions (Addendum)
Please get your shingles , TDAP vaccine at your pharmacy. ?Please take hydroxyzine '25mg'$  every 8 hours as needed for anxiety  ? ? ? ?It is important that you exercise regularly at least 30 minutes 5 times a week.  ?Think about what you will eat, plan ahead. ?Choose " clean, green, fresh or frozen" over canned, processed or packaged foods which are more sugary, salty and fatty. ?70 to 75% of food eaten should be vegetables and fruit. ?Three meals at set times with snacks allowed between meals, but they must be fruit or vegetables. ?Aim to eat over a 12 hour period , example 7 am to 7 pm, and STOP after  your last meal of the day. ?Drink water,generally about 64 ounces per day, no other drink is as healthy. Fruit juice is best enjoyed in a healthy way, by EATING the fruit. ? ?Thanks for choosing [facility], we consider it a privelige to serve you. ? ?

## 2021-11-12 NOTE — Progress Notes (Signed)
? ?Established Patient Office Visit ? ?Subjective:  ?Patient ID: Laura Scott, female    DOB: 1961-04-30  Age: 61 y.o. MRN: [id number] ? ?CC:  ?Chief Complaint  ?Patient presents with  ? Annual Exam  ?  CPE  ? Anxiety  ?  Worsened new medication is not helping   ? ? ?HPI ?Laura Scott with past medical history significant for essential hypertension, type 2 diabetes with hyperglycemia,  mixed hyperlipidemia, anxiety and depression presents for annual exam ?Has diabetic eye exam scheduled.  ? ?Patient complains of increased anxiety and insomnia, states that she has tried trazodone in the past which was not effective, psych took her off Ambien stated she would like medication to help assist with insomnia today.  ? ?Needs Tdap vaccine shingles vaccine, need for both vaccines discussed with patient today patient encouraged to get vaccines at her pharmacy.  ? ?She is up-to-date with mammogram, due for colonoscopy and Pap smear ? ?Past Medical History:  ?Diagnosis Date  ? Allergic rhinitis   ? Anxiety   ? Chronic pain   ? Depression   ? Diabetes mellitus without complication ([place])   ? GERD (gastroesophageal reflux disease)   ? Headache   ? Hyperlipidemia   ? Hypertension   ? IBS (irritable bowel syndrome)   ? PMS (premenstrual syndrome)   ? Reflux   ? ? ?Past Surgical History:  ?Procedure Laterality Date  ? COLONOSCOPY  08/29/2011  ? [name]: normal. high risk screening tcs in 5 years.   ? COLONOSCOPY WITH PROPOFOL N/A 10/20/2016  ? Procedure: COLONOSCOPY WITH PROPOFOL;  Surgeon: Daneil Dolin, MD;  [facility];  Service: Endoscopy;  Laterality: N/A;  10:45am  ? ESOPHAGOGASTRODUODENOSCOPY (EGD) WITH PROPOFOL N/A 10/20/2016  ? Procedure: ESOPHAGOGASTRODUODENOSCOPY (EGD) WITH PROPOFOL;  Surgeon: Daneil Dolin, MD;  [facility];  Service: Endoscopy;  Laterality: N/A;  ? LUMBAR DISC SURGERY    ? MALONEY DILATION N/A 10/20/2016  ? Procedure: MALONEY DILATION;  Surgeon: Daneil Dolin, MD;  [facility];  Service: Endoscopy;  Laterality: N/A;  ? POLYPECTOMY  10/20/2016  ? Procedure: POLYPECTOMY;  Surgeon: Daneil Dolin, MD;  [facility];  Service: Endoscopy;;  cecal and sigmoid polypectomies  ? ? ?Family History  ?Problem Relation Age of Onset  ? Hypertension Mother   ? Colon polyps Mother   ? Hypertension Father   ? Diabetes Father   ? Hypertension Sister   ? Hyperlipidemia Sister   ? Colon polyps Brother   ? Diabetes Maternal Aunt   ? Hypertension Maternal Aunt   ? Rheum arthritis Maternal Grandmother   ? Prostate cancer Maternal Grandfather   ? Cancer Paternal Grandfather   ?     not sure of type of cancer  ? Breast cancer Neg Hx   ? Cervical cancer Neg Hx   ? ? ?Social History  ? ?Socioeconomic History  ? Marital status: Divorced  ?  Spouse name: Not on file  ? Number of children: Not on file  ? Years of education: Not on file  ? Highest education level: Not on file  ?Occupational History  ? Not on file  ?Tobacco Use  ? Smoking status: Never  ? Smokeless tobacco: Never  ?Substance and Sexual Activity  ? Alcohol use: Yes  ?  Comment: occ  ? Drug use: No  ? Sexual activity: Yes  ?  Birth control/protection: None, Post-menopausal  ?Other Topics Concern  ? Not  on file  ?Social History Narrative  ? Lives with her brother. Pt is on disability from her lumbar fusion.   ? ?Social Determinants of Health  ? ?Financial Resource Strain: Not on file  ?Food Insecurity: Not on file  ?Transportation Needs: Not on file  ?Physical Activity: Not on file  ?Stress: Not on file  ?Social Connections: Not on file  ?Intimate Partner Violence: Not on file  ? ? ?Outpatient Medications Prior to Visit  ?Medication Sig Dispense Refill  ? ACCU-CHEK GUIDE test strip CHECK BLOOD SUGAR ONCE  DAILY 100 strip 3  ? atorvastatin (LIPITOR) 20 MG tablet Take 1 tablet (20 mg total) by mouth daily. 90 tablet 1  ? blood glucose meter kit and supplies KIT Dispense based on patient and insurance preference. Tests sugar once a day. DX  E11.9 1 each 0  ? budesonide-formoterol (SYMBICORT) 160-4.5 MCG/ACT inhaler Inhale 2 puffs into the lungs 2 (two) times daily. 1 Inhaler 5  ? Cholecalciferol (VITAMIN D) 125 MCG (5000 UT) CAPS Take 5,000 Units by mouth daily with breakfast. 30 capsule 2  ? Cyanocobalamin (VITAMIN B-12) 5000 MCG SUBL Place 1 tablet under the tongue daily.    ? escitalopram (LEXAPRO) 20 MG tablet TAKE 1 TABLET BY MOUTH  DAILY NEEDS OFFICE VISIT 90 tablet 1  ? fluticasone (FLONASE) 50 MCG/ACT nasal spray Place 2 sprays into both nostrils daily. (Patient taking differently: Place 2 sprays into both nostrils daily. As needed) 48 g 5  ? levocetirizine (XYZAL) 5 MG tablet Take 1 tablet (5 mg total) by mouth every evening. 90 tablet 0  ? Microlet Lancets MISC USE TO TEST BLOOD SUGAR  ONCE DAILY 100 each 3  ? busPIRone (BUSPAR) 5 MG tablet TAKE (1) TABLET BY MOUTH (3) TIMES DAILY AS NEEDED FOR ANXIETY 90 tablet 5  ? hydrochlorothiazide (HYDRODIURIL) 25 MG tablet TAKE 1 TABLET BY MOUTH ONCE DAILY AS NEEDED 90 tablet 1  ? losartan (COZAAR) 50 MG tablet TAKE 1 TABLET BY MOUTH  DAILY 90 tablet 0  ? omeprazole (PRILOSEC) 40 MG capsule Take 1 capsule (40 mg total) by mouth daily. 90 capsule 1  ? albuterol (VENTOLIN HFA) 108 (90 Base) MCG/ACT inhaler Inhale 1-2 puffs into the lungs every 6 (six) hours as needed for wheezing or shortness of breath. (Patient not taking: Reported on 11/12/2021) 18 g 0  ? dicyclomine (BENTYL) 10 MG capsule Take 1 capsule (10 mg total) by mouth 3 (three) times daily as needed for spasms. As needed for loose stool. (Patient not taking: Reported on 11/12/2021) 90 capsule 1  ? gabapentin (NEURONTIN) 300 MG capsule TAKE 1 CAPSULE BY MOUTH 3  TIMES DAILY (Patient not taking: Reported on 11/12/2021) 180 capsule 1  ? ibuprofen (ADVIL) 600 MG tablet Take 1 tablet (600 mg total) by mouth 2 (two) times daily as needed. (Patient not taking: Reported on 11/12/2021) 30 tablet 0  ? loratadine (CLARITIN) 10 MG tablet Take 10 mg by mouth  daily. (Patient not taking: Reported on 11/12/2021)    ? benzonatate (TESSALON) 100 MG capsule Take 1-2 capsules (100-200 mg total) by mouth 3 (three) times daily as needed for cough. 60 capsule 0  ? doxycycline (VIBRAMYCIN) 100 MG capsule Take 1 capsule (100 mg total) by mouth 2 (two) times daily. 14 capsule 0  ? predniSONE (DELTASONE) 20 MG tablet Take 2 tablets daily with breakfast. 10 tablet 0  ? promethazine-dextromethorphan (PROMETHAZINE-DM) 6.25-15 MG/5ML syrup Take 5 mLs by mouth at bedtime as needed for cough. 100  mL 0  ? ?No facility-administered medications prior to visit.  ? ? ?Allergies  ?Allergen Reactions  ? Sulfa Antibiotics Hives, Diarrhea and Itching  ? Other   ?  SURGICAL TAPE ?Reaction: rash   ? Penicillins Hives, Itching and Rash  ?  Pt states that she is able to take PCN ? ?Has patient had a PCN reaction causing immediate rash, facial/tongue/throat swelling, SOB or lightheadedness with hypotension: Yes ?Has patient had a PCN reaction causing severe rash involving mucus membranes or skin necrosis: No ?Has patient had a PCN reaction that required hospitalization: No ?Has patient had a PCN reaction occurring within the last 10 years: Yes ?If all of the above answers are "NO", then may proceed with Cephalosporin use.  ? ? ?ROS ?Review of Systems  ?Constitutional: Negative.   ?HENT: Negative.    ?Eyes: Negative.   ?Respiratory: Negative.    ?Cardiovascular: Negative.   ?Gastrointestinal: Negative.   ?Endocrine: Negative.   ?Musculoskeletal: Negative.   ?Skin: Negative.   ?Allergic/Immunologic: Negative.   ?Neurological: Negative.   ?Hematological: Negative.   ?Psychiatric/Behavioral:  Positive for sleep disturbance. Negative for agitation, behavioral problems, confusion, decreased concentration, dysphoric mood, hallucinations, self-injury and suicidal ideas. The patient is nervous/anxious.   ? ?  ?Objective:  ?Patient deferred breast exam today. Laural Roes CMA present as chaperone  ?Physical Exam ?Exam  conducted with a chaperone present.  ?Constitutional:   ?   General: She is not in acute distress. ?   Appearance: Normal appearance. She is not ill-appearing, toxic-appearing or diaphoretic.  ?HENT:  ?

## 2021-11-13 ENCOUNTER — Encounter: Payer: Self-pay | Admitting: Nurse Practitioner

## 2021-11-13 DIAGNOSIS — Z23 Encounter for immunization: Secondary | ICD-10-CM | POA: Insufficient documentation

## 2021-11-13 DIAGNOSIS — Z0000 Encounter for general adult medical examination without abnormal findings: Secondary | ICD-10-CM | POA: Insufficient documentation

## 2021-11-13 NOTE — Assessment & Plan Note (Signed)
Patient educated on CDC recommendation for the vaccine. Verbal consent was obtained from the patient, vaccine administered by nurse, no sign of adverse reactions noted at this time. Patient education on arm soreness and use of tylenol was discussed. Patient educated on the signs and symptoms of adverse effect and advise to contact the office if they occur. ?

## 2021-11-13 NOTE — Assessment & Plan Note (Signed)
BP Readings from Last 3 Encounters:  ?11/12/21 132/64  ?10/28/21 (!) 161/83  ?09/14/21 (!) 160/78  ?Condition well-controlled on losartan 50 mg daily hydrochlorothiazide 25 mg daily ?Continue current medications ?DASH diet advised engage in regular exercise ? ?

## 2021-11-13 NOTE — Assessment & Plan Note (Signed)
PHQ-9 score 16 GAD-7 score 18 ?Denies SI, HI ?On buspirone 5 mg 3 times daily, Lexapro 20 mg daily ?Awaiting  Psych placement for med management  ?Start hydroxyzine 25 mg twice daily as needed, hydroxyzine will also assist with insomnia. ?Continue current medications ?

## 2021-11-13 NOTE — Assessment & Plan Note (Signed)
Rx hydroxyzine 25 mg 3 times daily as needed ?Medication will also assist with anxiety. ?

## 2021-11-13 NOTE — Assessment & Plan Note (Addendum)
Annual exam as documented.  ?Counseling done include healthy lifestyle involving committing to 150 minutes of exercise per week, heart healthy diet, and attaining healthy weight. The importance of adequate sleep also discussed.  ?Regular use of seat belt and home safety were also discussed . ?Changes in health habits are decided on by patient with goals and time frames set for achieving them. ?Referred for colon cancer screening, patient encouraged to get her Tdap vaccine shingles vaccine at her pharmacy flu vaccine given in the office today. ?

## 2021-11-16 ENCOUNTER — Other Ambulatory Visit: Payer: Self-pay

## 2021-11-16 ENCOUNTER — Encounter: Payer: Self-pay | Admitting: Internal Medicine

## 2021-11-16 ENCOUNTER — Ambulatory Visit: Payer: No Typology Code available for payment source

## 2021-11-16 LAB — HM DIABETES EYE EXAM

## 2021-11-17 LAB — CYTOLOGY - PAP
Adequacy: ABSENT
Comment: NEGATIVE
Diagnosis: NEGATIVE
High risk HPV: NEGATIVE

## 2021-11-18 NOTE — Progress Notes (Signed)
? ?Office Visit Note ? ?Patient: Laura Scott             ?Date of Birth: April 27, 1961           ?MRN: [id number]             ?PCP: Renee Rival, FNP ?Referring: Renee Rival, FNP ?Visit Date: 12/02/2021 ?Occupation: '@GUAROCC'$ @ ? ?Subjective:  ?Pain in collarbones ? ?History of Present Illness: CHARLITA BRIAN is a 61 y.o. female seen in consultation per request of her PCP.  According the patient in January 2023 she noticed a knot on her right collarbone.  She saw her PCP for that and had further lab work.  She was referred here for further evaluation.  She continues to have some discomfort in her neck and trapezius region.  She states she used to have knee joint discomfort for many years but she has not had any cortisone injections in the last 5 years and the symptoms resolved.  She also gives history of degenerative disease of lumbar spine for at least 10 years.  She had L4-L5 fusion several years ago by Dr. Christella Noa.  She states her back is doing better now.  Only certain activities increases his back pain.  There is no history of joint swelling.  She gives history of arthritis in her maternal grandmother and maternal great-grandmother, maternal uncle, maternal aunt and her brother.  She is gravida 0.  There is no history of DVTs. ? ?Activities of Daily Living:  ?Patient reports morning stiffness for 2 hours.   ?Patient Denies nocturnal pain.  ?Difficulty dressing/grooming: Denies ?Difficulty climbing stairs: Reports ?Difficulty getting out of chair: Reports ?Difficulty using hands for taps, buttons, cutlery, and/or writing: Denies ? ?Review of Systems  ?Constitutional:  Positive for fatigue.  ?HENT:  Positive for nose dryness. Negative for mouth sores and mouth dryness.   ?Eyes:  Negative for pain, itching and dryness.  ?Respiratory:  Negative for shortness of breath and difficulty breathing.   ?Cardiovascular:  Negative for chest pain and palpitations.  ?Gastrointestinal:  Positive for  constipation. Negative for blood in stool and diarrhea.  ?Endocrine: Negative for increased urination.  ?Genitourinary:  Negative for difficulty urinating.  ?Musculoskeletal:  Positive for joint pain, joint pain and morning stiffness. Negative for joint swelling, myalgias, muscle tenderness and myalgias.  ?Skin:  Negative for color change, rash, redness and sensitivity to sunlight.  ?Allergic/Immunologic: Positive for susceptible to infections.  ?Neurological:  Positive for dizziness, memory loss and weakness. Negative for numbness and headaches.  ?Hematological:  Negative for bruising/bleeding tendency and swollen glands.  ?Psychiatric/Behavioral:  Positive for depressed mood and sleep disturbance. Negative for confusion. The patient is nervous/anxious.   ? ?PMFS History:  ?Patient Active Problem List  ? Diagnosis Date Noted  ? Annual physical exam 11/13/2021  ? Need for immunization against influenza 11/13/2021  ? Positive ANA (antinuclear antibody) 09/14/2021  ? Arthralgia of right acromioclavicular joint 09/14/2021  ? Collar bone pain 09/14/2021  ? Allergies 09/14/2021  ? Vitamin D deficiency 09/14/2021  ? Early satiety 07/30/2021  ? Abdominal pain, epigastric 07/30/2021  ? Abnormal LFTs 07/30/2021  ? Diarrhea 07/30/2021  ? Acute bacterial rhinosinusitis 06/25/2020  ? Cough 06/25/2020  ? Mixed hyperlipidemia 09/27/2019  ? Type 2 diabetes mellitus with hyperglycemia, without long-term current use of insulin ([place]) 01/08/2018  ? Anxiety and depression 05/17/2017  ? Prediabetes 10/26/2016  ? LUQ pain 09/28/2016  ? Rectal bleeding 09/28/2016  ? Constipation 09/28/2016  ? FHx: colonic  polyps 09/28/2016  ? Dysphagia 09/28/2016  ? Anxiety 05/11/2015  ? Clinical depression 05/11/2015  ? Essential hypertension, benign 05/11/2015  ? Reflux 05/11/2015  ? Lumbar spondylosis 03/26/2014  ? Impaired fasting glucose 10/29/2013  ? Arthritis 10/29/2013  ? Chronic back pain 04/11/2013  ? Insomnia 04/11/2013  ?  ?Past Medical  History:  ?Diagnosis Date  ? Allergic rhinitis   ? Anxiety   ? Chronic pain   ? Depression   ? Diabetes mellitus without complication ([place])   ? GERD (gastroesophageal reflux disease)   ? Headache   ? Hyperlipidemia   ? Hypertension   ? IBS (irritable bowel syndrome)   ? PMS (premenstrual syndrome)   ? Reflux   ?  ?Family History  ?Problem Relation Age of Onset  ? Hypertension Mother   ? Colon polyps Mother   ? Hypertension Father   ? Diabetes Father   ? Hypertension Sister   ? Hyperlipidemia Sister   ? Colon polyps Brother   ? Arthritis Brother   ? Diabetes Maternal Aunt   ? Hypertension Maternal Aunt   ? Rheum arthritis Maternal Grandmother   ? Prostate cancer Maternal Grandfather   ? Cancer Paternal Grandfather   ?     not sure of type of cancer  ? Breast cancer Neg Hx   ? Cervical cancer Neg Hx   ? ?Past Surgical History:  ?Procedure Laterality Date  ? COLONOSCOPY  08/29/2011  ? [name]: normal. high risk screening tcs in 5 years.   ? COLONOSCOPY WITH PROPOFOL N/A 10/20/2016  ? Procedure: COLONOSCOPY WITH PROPOFOL;  Surgeon: Daneil Dolin, MD;  [facility];  Service: Endoscopy;  Laterality: N/A;  10:45am  ? ESOPHAGOGASTRODUODENOSCOPY (EGD) WITH PROPOFOL N/A 10/20/2016  ? Procedure: ESOPHAGOGASTRODUODENOSCOPY (EGD) WITH PROPOFOL;  Surgeon: Daneil Dolin, MD;  [facility];  Service: Endoscopy;  Laterality: N/A;  ? LUMBAR DISC SURGERY    ? MALONEY DILATION N/A 10/20/2016  ? Procedure: MALONEY DILATION;  Surgeon: Daneil Dolin, MD;  [facility];  Service: Endoscopy;  Laterality: N/A;  ? POLYPECTOMY  10/20/2016  ? Procedure: POLYPECTOMY;  Surgeon: Daneil Dolin, MD;  [facility];  Service: Endoscopy;;  cecal and sigmoid polypectomies  ? ?Social History  ? ?Social History Narrative  ? Lives with her brother. Pt is on disability from her lumbar fusion.   ? ?Immunization History  ?Administered Date(s) Administered  ? Influenza,inj,Quad PF,6+ Mos 05/31/2018, 08/07/2021, 11/12/2021   ? Influenza-Unspecified 05/23/2011  ? Moderna Covid-19 Vaccine Bivalent Booster 44yr & up 09/07/2021  ? Moderna Sars-Covid-2 Vaccination 11/02/2019, 12/04/2019, 08/06/2020  ? Pneumococcal Polysaccharide-23 08/20/2018  ?  ? ?Objective: ?Vital Signs: BP (!) 158/70 (BP Location: Right Arm, Patient Position: Sitting, Cuff Size: Normal)   Pulse 76   Ht 5' 5.5" (1.664 m)   Wt 170 lb 12.8 oz (77.5 kg)   LMP 09/03/2013   BMI 27.99 kg/m?   ? ?Physical Exam ?Vitals and nursing note reviewed.  ?Constitutional:   ?   Appearance: She is well-developed.  ?HENT:  ?   Head: Normocephalic and atraumatic.  ?Eyes:  ?   Conjunctiva/sclera: Conjunctivae normal.  ?Cardiovascular:  ?   Rate and Rhythm: Normal rate and regular rhythm.  ?   Heart sounds: Normal heart sounds.  ?Pulmonary:  ?   Effort: Pulmonary effort is normal.  ?   Breath sounds: Normal breath sounds.  ?Abdominal:  ?   General: Bowel sounds are normal.  ?  Palpations: Abdomen is soft.  ?Musculoskeletal:  ?   Cervical back: Normal range of motion.  ?Lymphadenopathy:  ?   Cervical: No cervical adenopathy.  ?Skin: ?   General: Skin is warm and dry.  ?   Capillary Refill: Capillary refill takes less than 2 seconds.  ?Neurological:  ?   Mental Status: She is alert and oriented to person, place, and time.  ?Psychiatric:     ?   Behavior: Behavior normal.  ?  ? ?Musculoskeletal Exam: C-spine was in good range of motion.  She had painful limited range of motion of the lumbar spine.  There was no SI joint tenderness.  She is swelling before right sternoclavicular joint with tenderness.  There was no tenderness on left sternoclavicular joints.  Shoulder joints were in good range of motion.  Elbow joints wrist joints, MCPs PIPs and DIPs with good range of motion with no synovitis.  Hip joints, knee joints, ankles, MTPs PIPs and DIPs and good range of motion with no synovitis. ? ?CDAI Exam: ?CDAI Score: -- ?Patient Global: --; Provider Global: -- ?Swollen: --; Tender:  -- ?Joint Exam 12/02/2021  ? ?No joint exam has been documented for this visit  ? ?There is currently no information documented on the homunculus. Go to the Rheumatology activity and complete the homunculus joint exam. ? ?Investig

## 2021-11-22 NOTE — Progress Notes (Signed)
Normal Pap, repeat in 3 years.

## 2021-11-30 DIAGNOSIS — R7989 Other specified abnormal findings of blood chemistry: Secondary | ICD-10-CM | POA: Diagnosis not present

## 2021-12-01 LAB — HIV ANTIBODY (ROUTINE TESTING W REFLEX): HIV Screen 4th Generation wRfx: NONREACTIVE

## 2021-12-01 LAB — FERRITIN: Ferritin: 441 ng/mL — ABNORMAL HIGH (ref 15–150)

## 2021-12-01 LAB — LIPID PANEL
Chol/HDL Ratio: 3.4 ratio (ref 0.0–4.4)
Cholesterol, Total: 192 mg/dL (ref 100–199)
HDL: 56 mg/dL (ref >39)
LDL Chol Calc (NIH): 82 mg/dL (ref 0–99)
Triglycerides: 338 mg/dL — ABNORMAL HIGH (ref 0–149)
VLDL Cholesterol Cal: 54 mg/dL — ABNORMAL HIGH (ref 5–40)

## 2021-12-01 LAB — TSH+FREE T4
Free T4: 1.2 ng/dL (ref 0.82–1.77)
TSH: 1.62 u[IU]/mL (ref 0.450–4.500)

## 2021-12-01 NOTE — Progress Notes (Signed)
Labs is stable, triglyceride slightly elevated, patient should start taking OTC fish oil daily continue current medications. ?

## 2021-12-02 ENCOUNTER — Ambulatory Visit: Payer: Self-pay

## 2021-12-02 ENCOUNTER — Encounter: Payer: Self-pay | Admitting: Rheumatology

## 2021-12-02 ENCOUNTER — Ambulatory Visit (INDEPENDENT_AMBULATORY_CARE_PROVIDER_SITE_OTHER): Payer: No Typology Code available for payment source

## 2021-12-02 ENCOUNTER — Ambulatory Visit (INDEPENDENT_AMBULATORY_CARE_PROVIDER_SITE_OTHER): Payer: No Typology Code available for payment source | Admitting: Rheumatology

## 2021-12-02 VITALS — BP 158/70 | HR 76 | Ht 65.5 in | Wt 170.8 lb

## 2021-12-02 DIAGNOSIS — M5136 Other intervertebral disc degeneration, lumbar region: Secondary | ICD-10-CM | POA: Diagnosis not present

## 2021-12-02 DIAGNOSIS — E1165 Type 2 diabetes mellitus with hyperglycemia: Secondary | ICD-10-CM | POA: Diagnosis not present

## 2021-12-02 DIAGNOSIS — M25511 Pain in right shoulder: Secondary | ICD-10-CM

## 2021-12-02 DIAGNOSIS — F419 Anxiety disorder, unspecified: Secondary | ICD-10-CM | POA: Diagnosis not present

## 2021-12-02 DIAGNOSIS — K5903 Drug induced constipation: Secondary | ICD-10-CM

## 2021-12-02 DIAGNOSIS — M47816 Spondylosis without myelopathy or radiculopathy, lumbar region: Secondary | ICD-10-CM

## 2021-12-02 DIAGNOSIS — R768 Other specified abnormal immunological findings in serum: Secondary | ICD-10-CM | POA: Diagnosis not present

## 2021-12-02 DIAGNOSIS — I10 Essential (primary) hypertension: Secondary | ICD-10-CM

## 2021-12-02 DIAGNOSIS — E782 Mixed hyperlipidemia: Secondary | ICD-10-CM | POA: Diagnosis not present

## 2021-12-02 DIAGNOSIS — R7989 Other specified abnormal findings of blood chemistry: Secondary | ICD-10-CM

## 2021-12-02 DIAGNOSIS — Z8371 Family history of colonic polyps: Secondary | ICD-10-CM

## 2021-12-02 DIAGNOSIS — E559 Vitamin D deficiency, unspecified: Secondary | ICD-10-CM | POA: Diagnosis not present

## 2021-12-02 DIAGNOSIS — Z79899 Other long term (current) drug therapy: Secondary | ICD-10-CM | POA: Diagnosis not present

## 2021-12-02 DIAGNOSIS — F32A Depression, unspecified: Secondary | ICD-10-CM

## 2021-12-02 DIAGNOSIS — R1013 Epigastric pain: Secondary | ICD-10-CM

## 2021-12-02 DIAGNOSIS — G4709 Other insomnia: Secondary | ICD-10-CM | POA: Diagnosis not present

## 2021-12-02 DIAGNOSIS — M898X1 Other specified disorders of bone, shoulder: Secondary | ICD-10-CM

## 2021-12-02 DIAGNOSIS — F3341 Major depressive disorder, recurrent, in partial remission: Secondary | ICD-10-CM

## 2021-12-02 LAB — VITAMIN D 25 HYDROXY (VIT D DEFICIENCY, FRACTURES): Vit D, 25-Hydroxy: 51 ng/mL (ref 30.0–100.0)

## 2021-12-02 LAB — FERRITIN: Ferritin: 444 ng/mL — ABNORMAL HIGH (ref 15–150)

## 2021-12-05 NOTE — Progress Notes (Signed)
Please add ENA, C3 and C4.

## 2021-12-06 ENCOUNTER — Ambulatory Visit: Payer: No Typology Code available for payment source | Admitting: "Endocrinology

## 2021-12-06 ENCOUNTER — Ambulatory Visit: Payer: No Typology Code available for payment source | Admitting: Nutrition

## 2021-12-08 ENCOUNTER — Ambulatory Visit: Payer: No Typology Code available for payment source | Admitting: Internal Medicine

## 2021-12-08 LAB — ANTI-SCLERODERMA ANTIBODY: Scleroderma (Scl-70) (ENA) Antibody, IgG: <1 AI

## 2021-12-08 LAB — RHEUMATOID FACTOR: Rheumatoid fact SerPl-aCnc: <14 IU/mL (ref <14)

## 2021-12-08 LAB — TEST AUTHORIZATION

## 2021-12-08 LAB — PROTEIN ELECTROPHORESIS, SERUM, WITH REFLEX
Albumin ELP: 4.7 g/dL (ref 3.8–4.8)
Alpha 1: 0.3 g/dL (ref 0.2–0.3)
Alpha 2: 0.8 g/dL (ref 0.5–0.9)
Beta 2: 0.5 g/dL (ref 0.2–0.5)
Beta Globulin: 0.5 g/dL (ref 0.4–0.6)
Gamma Globulin: 1.2 g/dL (ref 0.8–1.7)
Total Protein: 8.1 g/dL (ref 6.1–8.1)

## 2021-12-08 LAB — QUANTIFERON-TB GOLD PLUS
Mitogen-NIL: >10 IU/mL
NIL: 0.01 IU/mL
QuantiFERON-TB Gold Plus: NEGATIVE
TB1-NIL: 0 IU/mL
TB2-NIL: 0 IU/mL

## 2021-12-08 LAB — HLA-B27 ANTIGEN: HLA-B27 Antigen: NEGATIVE

## 2021-12-08 LAB — ANTI-DNA ANTIBODY, DOUBLE-STRANDED: ds DNA Ab: <1 IU/mL

## 2021-12-08 LAB — GLUCOSE 6 PHOSPHATE DEHYDROGENASE: G-6PDH: 20.3 U/g Hgb (ref 7.0–20.5)

## 2021-12-08 LAB — ANTI-NUCLEAR AB-TITER (ANA TITER)
ANA TITER: 1:320 {titer} — ABNORMAL HIGH
ANA Titer 1: 1:1280 {titer} — ABNORMAL HIGH

## 2021-12-08 LAB — ANA: Anti Nuclear Antibody (ANA): POSITIVE — AB

## 2021-12-08 LAB — IGG, IGA, IGM
IgG (Immunoglobin G), Serum: 1322 mg/dL (ref 600–1640)
IgM, Serum: 140 mg/dL (ref 50–300)
Immunoglobulin A: 551 mg/dL — ABNORMAL HIGH (ref 47–310)

## 2021-12-08 LAB — CYCLIC CITRUL PEPTIDE ANTIBODY, IGG: Cyclic Citrullin Peptide Ab: <16 UNITS

## 2021-12-08 LAB — SJOGRENS SYNDROME-B EXTRACTABLE NUCLEAR ANTIBODY: SSB (La) (ENA) Antibody, IgG: <1 AI

## 2021-12-08 LAB — SJOGRENS SYNDROME-A EXTRACTABLE NUCLEAR ANTIBODY: SSA (Ro) (ENA) Antibody, IgG: <1 AI

## 2021-12-08 LAB — SEDIMENTATION RATE: Sed Rate: 17 mm/h (ref 0–30)

## 2021-12-08 LAB — ANTI-SMITH ANTIBODY: ENA SM Ab Ser-aCnc: <1 AI

## 2021-12-08 LAB — RNP ANTIBODY: Ribonucleic Protein(ENA) Antibody, IgG: <1 AI

## 2021-12-09 ENCOUNTER — Encounter: Payer: Self-pay | Admitting: "Endocrinology

## 2021-12-09 ENCOUNTER — Ambulatory Visit (INDEPENDENT_AMBULATORY_CARE_PROVIDER_SITE_OTHER): Payer: No Typology Code available for payment source | Admitting: "Endocrinology

## 2021-12-09 ENCOUNTER — Other Ambulatory Visit: Payer: Self-pay | Admitting: *Deleted

## 2021-12-09 VITALS — BP 140/66 | HR 80 | Ht 65.5 in | Wt 173.2 lb

## 2021-12-09 DIAGNOSIS — E1165 Type 2 diabetes mellitus with hyperglycemia: Secondary | ICD-10-CM

## 2021-12-09 DIAGNOSIS — E782 Mixed hyperlipidemia: Secondary | ICD-10-CM

## 2021-12-09 DIAGNOSIS — I10 Essential (primary) hypertension: Secondary | ICD-10-CM

## 2021-12-09 DIAGNOSIS — M25511 Pain in right shoulder: Secondary | ICD-10-CM

## 2021-12-09 LAB — POCT GLYCOSYLATED HEMOGLOBIN (HGB A1C): HbA1c, POC (controlled diabetic range): 5.9 % (ref 0.0–7.0)

## 2021-12-09 NOTE — Progress Notes (Signed)
? ?                                                         ?     12/09/2021, 4:43 PM ? ? ? ?Endocrinology follow-up note ? ? ? ?Subjective:  ? ? Patient ID: Laura Scott, female    DOB: [birth year].  ?Laura Scott is being seen in follow-up after she was seen in consultation for  management of currently controlled symptomatic diabetes requested by  Renee Rival, FNP. ? ? ?Past Medical History:  ?Diagnosis Date  ? Allergic rhinitis   ? Anxiety   ? Chronic pain   ? Depression   ? Diabetes mellitus without complication ([place])   ? GERD (gastroesophageal reflux disease)   ? Headache   ? Hyperlipidemia   ? Hypertension   ? IBS (irritable bowel syndrome)   ? PMS (premenstrual syndrome)   ? Reflux   ? ? ?Past Surgical History:  ?Procedure Laterality Date  ? COLONOSCOPY  08/29/2011  ? [name]: normal. high risk screening tcs in 5 years.   ? COLONOSCOPY WITH PROPOFOL N/A 10/20/2016  ? Procedure: COLONOSCOPY WITH PROPOFOL;  Surgeon: Daneil Dolin, MD;  [facility];  Service: Endoscopy;  Laterality: N/A;  10:45am  ? ESOPHAGOGASTRODUODENOSCOPY (EGD) WITH PROPOFOL N/A 10/20/2016  ? Procedure: ESOPHAGOGASTRODUODENOSCOPY (EGD) WITH PROPOFOL;  Surgeon: Daneil Dolin, MD;  [facility];  Service: Endoscopy;  Laterality: N/A;  ? LUMBAR DISC SURGERY    ? MALONEY DILATION N/A 10/20/2016  ? Procedure: MALONEY DILATION;  Surgeon: Daneil Dolin, MD;  [facility];  Service: Endoscopy;  Laterality: N/A;  ? POLYPECTOMY  10/20/2016  ? Procedure: POLYPECTOMY;  Surgeon: Daneil Dolin, MD;  [facility];  Service: Endoscopy;;  cecal and sigmoid polypectomies  ? ? ?Social History  ? ?Socioeconomic History  ? Marital status: Divorced  ?  Spouse name: Not on file  ? Number of children: Not on file  ? Years of education: Not on file  ? Highest education level: Not on file  ?Occupational History  ? Not on file  ?Tobacco Use  ? Smoking status: Never  ?  Passive exposure: Current  ?  Smokeless tobacco: Never  ?Vaping Use  ? Vaping Use: Never used  ?Substance and Sexual Activity  ? Alcohol use: Yes  ?  Comment: occ  ? Drug use: No  ? Sexual activity: Yes  ?  Birth control/protection: None, Post-menopausal  ?Other Topics Concern  ? Not on file  ?Social History Narrative  ? Lives with her brother. Pt is on disability from her lumbar fusion.   ? ?Social Determinants of Health  ? ?Financial Resource Strain: Not on file  ?Food Insecurity: Not on file  ?Transportation Needs: Not on file  ?Physical Activity: Not on file  ?Stress: Not on file  ?Social Connections: Not on file  ? ? ?Family History  ?Problem Relation Age of Onset  ? Hypertension Mother   ? Colon polyps Mother   ? Hypertension Father   ? Diabetes Father   ? Hypertension Sister   ? Hyperlipidemia Sister   ? Colon polyps Brother   ? Arthritis Brother   ? Diabetes Maternal Aunt   ? Hypertension Maternal Aunt   ? Rheum arthritis Maternal  Grandmother   ? Prostate cancer Maternal Grandfather   ? Cancer Paternal Grandfather   ?     not sure of type of cancer  ? Breast cancer Neg Hx   ? Cervical cancer Neg Hx   ? ? ?Outpatient Encounter Medications as of 12/09/2021  ?Medication Sig  ? gabapentin (NEURONTIN) 300 MG capsule TAKE 1 CAPSULE BY MOUTH 3  TIMES DAILY (Patient taking differently: TAKE 1 CAPSULE BY MOUTH 3  TIMES DAILY AS NEEDED)  ? ibuprofen (ADVIL) 600 MG tablet Take 1 tablet (600 mg total) by mouth 2 (two) times daily as needed.  ? Omega-3 Fatty Acids (FISH OIL OMEGA-3 PO) Take 1 capsule by mouth daily.  ? ACCU-CHEK GUIDE test strip CHECK BLOOD SUGAR ONCE  DAILY  ? albuterol (VENTOLIN HFA) 108 (90 Base) MCG/ACT inhaler Inhale 1-2 puffs into the lungs every 6 (six) hours as needed for wheezing or shortness of breath.  ? atorvastatin (LIPITOR) 20 MG tablet Take 1 tablet (20 mg total) by mouth daily.  ? blood glucose meter kit and supplies KIT Dispense based on patient and insurance preference. Tests sugar once a day. DX E11.9  ?  budesonide-formoterol (SYMBICORT) 160-4.5 MCG/ACT inhaler Inhale 2 puffs into the lungs 2 (two) times daily.  ? busPIRone (BUSPAR) 5 MG tablet TAKE (1) TABLET BY MOUTH (3) TIMES DAILY AS NEEDED FOR ANXIETY  ? Cholecalciferol (VITAMIN D) 125 MCG (5000 UT) CAPS Take 5,000 Units by mouth daily with breakfast.  ? Cyanocobalamin (VITAMIN B-12) 5000 MCG SUBL Place 1 tablet under the tongue daily.  ? escitalopram (LEXAPRO) 20 MG tablet TAKE 1 TABLET BY MOUTH  DAILY NEEDS OFFICE VISIT  ? fluticasone (FLONASE) 50 MCG/ACT nasal spray Place 2 sprays into both nostrils daily. (Patient taking differently: Place 2 sprays into both nostrils daily. As needed)  ? hydrochlorothiazide (HYDRODIURIL) 25 MG tablet TAKE 1 TABLET BY MOUTH ONCE DAILY AS NEEDED  ? hydrOXYzine (VISTARIL) 25 MG capsule Take 1 capsule (25 mg total) by mouth every 8 (eight) hours as needed.  ? levocetirizine (XYZAL) 5 MG tablet Take 1 tablet (5 mg total) by mouth every evening.  ? losartan (COZAAR) 50 MG tablet Take 1 tablet (50 mg total) by mouth daily.  ? Microlet Lancets MISC USE TO TEST BLOOD SUGAR  ONCE DAILY  ? omeprazole (PRILOSEC) 40 MG capsule Take 1 capsule (40 mg total) by mouth daily.  ? [DISCONTINUED] dicyclomine (BENTYL) 10 MG capsule Take 1 capsule (10 mg total) by mouth 3 (three) times daily as needed for spasms. As needed for loose stool. (Patient not taking: Reported on 11/12/2021)  ? [DISCONTINUED] loratadine (CLARITIN) 10 MG tablet Take 10 mg by mouth daily. (Patient not taking: Reported on 11/12/2021)  ? ?No facility-administered encounter medications on file as of 12/09/2021.  ? ? ?ALLERGIES: ?Allergies  ?Allergen Reactions  ? Sulfa Antibiotics Hives, Diarrhea and Itching  ? Other   ?  SURGICAL TAPE ?Reaction: rash   ? Penicillins Hives, Itching and Rash  ?  Pt states that she is able to take PCN ? ?Has patient had a PCN reaction causing immediate rash, facial/tongue/throat swelling, SOB or lightheadedness with hypotension: Yes ?Has patient  had a PCN reaction causing severe rash involving mucus membranes or skin necrosis: No ?Has patient had a PCN reaction that required hospitalization: No ?Has patient had a PCN reaction occurring within the last 10 years: Yes ?If all of the above answers are "NO", then may proceed with Cephalosporin use.  ? ? ?VACCINATION  STATUS: ?Immunization History  ?Administered Date(s) Administered  ? Influenza,inj,Quad PF,6+ Mos 05/31/2018, 08/07/2021, 11/12/2021  ? Influenza-Unspecified 05/23/2011  ? Moderna Covid-19 Vaccine Bivalent Booster 40yr & up 09/07/2021  ? Moderna Sars-Covid-2 Vaccination 11/02/2019, 12/04/2019, 08/06/2020  ? Pneumococcal Polysaccharide-23 08/20/2018  ? ? ?Diabetes ?She presents for her follow-up diabetic visit. She has type 2 diabetes mellitus. Onset time: She was diagnosed at approximate age of 89years. Her disease course has been stable. There are no hypoglycemic associated symptoms. Pertinent negatives for hypoglycemia include no confusion, headaches, pallor or seizures. There are no diabetic associated symptoms. Pertinent negatives for diabetes include no chest pain, no polydipsia, no polyphagia and no polyuria. There are no hypoglycemic complications. Symptoms are stable. There are no diabetic complications. Risk factors for coronary artery disease include diabetes mellitus, dyslipidemia, family history, hypertension, post-menopausal and sedentary lifestyle. Current diabetic treatment includes oral agent (monotherapy). Her weight is stable. She is following a generally unhealthy diet. When asked about meal planning, she reported none. She has not had a previous visit with a dietitian. She rarely participates in exercise. Her breakfast blood glucose range is generally 140-180 mg/dl. Her overall blood glucose range is 140-180 mg/dl. (She presents with controlled glycemia EAG of 146 and POC a1c of 5.9%.  she is not on meds.) An ACE inhibitor/angiotensin II receptor blocker is being taken. She does  not see a podiatrist.Eye exam is current.  ?Hyperlipidemia ?This is a chronic problem. The current episode started more than 1 year ago. The problem is uncontrolled. Recent lipid tests were reviewed and are variab

## 2021-12-09 NOTE — Patient Instructions (Signed)

## 2021-12-09 NOTE — Progress Notes (Signed)
Although autoimmune labs are negative except for positive ANA which does not explain the right sternoclavicular joint swelling.  Please schedule MRI of the right sternoclavicular joint to look for synovitis.

## 2021-12-13 ENCOUNTER — Other Ambulatory Visit: Payer: Self-pay | Admitting: Nurse Practitioner

## 2021-12-13 DIAGNOSIS — F32A Depression, unspecified: Secondary | ICD-10-CM

## 2021-12-14 NOTE — Progress Notes (Signed)
? ?Office Visit Note ? ?Patient: Laura Scott             ?Date of Birth: 09-28-1960           ?MRN: [id number]             ?PCP: Renee Rival, FNP ?Referring: Renee Rival, FNP ?Visit Date: 12/28/2021 ?Occupation: '@GUAROCC'$ @ ? ?Subjective:  ?Right sternoclavicular joint pain ? ?History of Present Illness: Laura Scott is a 61 y.o. female she returns today after her initial visit.  She continues to have pain and discomfort in her right sternoclavicular joint.  She has MRI of the right sternoclavicular joint is scheduled next week.  She denies pain or discomfort in any other joints.  There is no history of acne.  The lower back pain is manageable.  She denies any history of skin tightness or Raynaud's phenomenon. ? ?Activities of Daily Living:  ?Patient reports morning stiffness for 10-15 minutes.   ?Patient Denies nocturnal pain.  ?Difficulty dressing/grooming: Denies ?Difficulty climbing stairs: Denies ?Difficulty getting out of chair: Reports ?Difficulty using hands for taps, buttons, cutlery, and/or writing: Denies ? ?Review of Systems  ?Constitutional:  Positive for fatigue.  ?HENT:  Positive for nose dryness. Negative for mouth sores and mouth dryness.   ?Eyes:  Positive for itching. Negative for pain and dryness.  ?Respiratory:  Negative for shortness of breath and difficulty breathing.   ?Cardiovascular:  Negative for chest pain and palpitations.  ?Gastrointestinal:  Positive for diarrhea. Negative for blood in stool and constipation.  ?Endocrine: Negative for increased urination.  ?Genitourinary:  Negative for difficulty urinating.  ?Musculoskeletal:  Positive for joint pain, joint pain and morning stiffness. Negative for joint swelling, myalgias, muscle tenderness and myalgias.  ?Skin:  Negative for color change, rash and redness.  ?Allergic/Immunologic: Negative for susceptible to infections.  ?Neurological:  Positive for memory loss. Negative for dizziness, numbness, headaches and  weakness.  ?Hematological:  Negative for bruising/bleeding tendency.  ?Psychiatric/Behavioral:  Negative for confusion. The patient is nervous/anxious.   ? ?PMFS History:  ?Patient Active Problem List  ? Diagnosis Date Noted  ? Annual physical exam 11/13/2021  ? Need for immunization against influenza 11/13/2021  ? Positive ANA (antinuclear antibody) 09/14/2021  ? Arthralgia of right acromioclavicular joint 09/14/2021  ? Collar bone pain 09/14/2021  ? Allergies 09/14/2021  ? Vitamin D deficiency 09/14/2021  ? Early satiety 07/30/2021  ? Abdominal pain, epigastric 07/30/2021  ? Abnormal LFTs 07/30/2021  ? Diarrhea 07/30/2021  ? Acute bacterial rhinosinusitis 06/25/2020  ? Cough 06/25/2020  ? Mixed hyperlipidemia 09/27/2019  ? Type 2 diabetes mellitus with hyperglycemia, without long-term current use of insulin ([place]) 01/08/2018  ? Anxiety and depression 05/17/2017  ? Prediabetes 10/26/2016  ? LUQ pain 09/28/2016  ? Rectal bleeding 09/28/2016  ? Constipation 09/28/2016  ? FHx: colonic polyps 09/28/2016  ? Dysphagia 09/28/2016  ? Anxiety 05/11/2015  ? Clinical depression 05/11/2015  ? Essential hypertension, benign 05/11/2015  ? Reflux 05/11/2015  ? Lumbar spondylosis 03/26/2014  ? Impaired fasting glucose 10/29/2013  ? Arthritis 10/29/2013  ? Chronic back pain 04/11/2013  ? Insomnia 04/11/2013  ?  ?Past Medical History:  ?Diagnosis Date  ? Allergic rhinitis   ? Anxiety   ? Chronic pain   ? Depression   ? Diabetes mellitus without complication ([place])   ? GERD (gastroesophageal reflux disease)   ? Headache   ? Hyperlipidemia   ? Hypertension   ? IBS (irritable bowel syndrome)   ?  PMS (premenstrual syndrome)   ? Reflux   ?  ?Family History  ?Problem Relation Age of Onset  ? Hypertension Mother   ? Colon polyps Mother   ? Hypertension Father   ? Diabetes Father   ? Hypertension Sister   ? Hyperlipidemia Sister   ? Colon polyps Brother   ? Arthritis Brother   ? Diabetes Maternal Aunt   ? Hypertension Maternal Aunt   ? Rheum  arthritis Maternal Grandmother   ? Prostate cancer Maternal Grandfather   ? Cancer Paternal Grandfather   ?     not sure of type of cancer  ? Breast cancer Neg Hx   ? Cervical cancer Neg Hx   ? ?Past Surgical History:  ?Procedure Laterality Date  ? COLONOSCOPY  08/29/2011  ? [name]: normal. high risk screening tcs in 5 years.   ? COLONOSCOPY WITH PROPOFOL N/A 10/20/2016  ? Procedure: COLONOSCOPY WITH PROPOFOL;  Surgeon: Daneil Dolin, MD;  [facility];  Service: Endoscopy;  Laterality: N/A;  10:45am  ? ESOPHAGOGASTRODUODENOSCOPY (EGD) WITH PROPOFOL N/A 10/20/2016  ? Procedure: ESOPHAGOGASTRODUODENOSCOPY (EGD) WITH PROPOFOL;  Surgeon: Daneil Dolin, MD;  [facility];  Service: Endoscopy;  Laterality: N/A;  ? LUMBAR DISC SURGERY    ? MALONEY DILATION N/A 10/20/2016  ? Procedure: MALONEY DILATION;  Surgeon: Daneil Dolin, MD;  [facility];  Service: Endoscopy;  Laterality: N/A;  ? POLYPECTOMY  10/20/2016  ? Procedure: POLYPECTOMY;  Surgeon: Daneil Dolin, MD;  [facility];  Service: Endoscopy;;  cecal and sigmoid polypectomies  ? ?Social History  ? ?Social History Narrative  ? Lives with her brother. Pt is on disability from her lumbar fusion.   ? ?Immunization History  ?Administered Date(s) Administered  ? Influenza,inj,Quad PF,6+ Mos 05/31/2018, 08/07/2021, 11/12/2021  ? Influenza-Unspecified 05/23/2011  ? Moderna Covid-19 Vaccine Bivalent Booster 43yr & up 09/07/2021  ? Moderna Sars-Covid-2 Vaccination 11/02/2019, 12/04/2019, 08/06/2020  ? Pneumococcal Polysaccharide-23 08/20/2018  ?  ? ?Objective: ?Vital Signs: BP (!) 150/72 (BP Location: Left Arm, Patient Position: Sitting, Cuff Size: Normal)   Pulse 92   Ht 5' 5.5" (1.664 m)   Wt 170 lb (77.1 kg)   LMP 09/03/2013   BMI 27.86 kg/m?   ? ?Physical Exam ?Vitals and nursing note reviewed.  ?Constitutional:   ?   Appearance: She is well-developed.  ?HENT:  ?   Head: Normocephalic and atraumatic.  ?Eyes:  ?    Conjunctiva/sclera: Conjunctivae normal.  ?Cardiovascular:  ?   Rate and Rhythm: Normal rate and regular rhythm.  ?   Heart sounds: Normal heart sounds.  ?Pulmonary:  ?   Effort: Pulmonary effort is normal.  ?   Breath sounds: Normal breath sounds.  ?Abdominal:  ?   General: Bowel sounds are normal.  ?   Palpations: Abdomen is soft.  ?Musculoskeletal:  ?   Cervical back: Normal range of motion.  ?Lymphadenopathy:  ?   Cervical: No cervical adenopathy.  ?Skin: ?   General: Skin is warm and dry.  ?   Capillary Refill: Capillary refill takes less than 2 seconds.  ?Neurological:  ?   Mental Status: She is alert and oriented to person, place, and time.  ?Psychiatric:     ?   Behavior: Behavior normal.  ?  ? ?Musculoskeletal Exam: Cervical spine was in good range of motion.  She has some discomfort range of motion of her lumbar spine.  Shoulder joints, elbow joints, wrist joints,  MCPs PIPs and DIPs and good range of motion  She has some discomfort range of motion of her lumbar spine.  Shoulder joints, elbow joints, wrist joints, MCPs PIPs and DIPs and good range of motion with no synovitis.  Hip joints, knee joints, ankles, MTPs and PIPs with good range of motion.  She had tenderness and thickening.  She had thickening of the right sternoclavicular joint with no tenderness. ? ? ?CDAI ?Patient Global: --; Provider Global: -- ?Swollen: 0 ; Tender: 0  ?Joint Exam 12/28/2021  ? ?No joint exam has been documented for this visit  ? ?There is currently no information documented on the homunculus. Go to the Rheumatology activity and complete the homunculus joint exam. ? ?Investigation: ?No additional findings. ? ?Imaging: ?XR Sternum ? ?Result Date: 12/02/2021 ?No sternoclavicular joint narrowing was noted.  No erosive changes were noted. Impression: Unremarkable x-ray of the clavicle and right sternoclavicular joint.  ? ?Recent Labs: ?Lab Results  ?Component Value Date  ? WBC 7.6 09/02/2021  ? HGB 13.9 09/02/2021  ? PLT 377  09/02/2021  ? NA 139 09/02/2021  ? K 4.2 09/02/2021  ? CL 96 09/02/2021  ? CO2 25 09/02/2021  ? GLUCOSE 151 (H) 09/02/2021  ? BUN 8 09/02/2021  ? CREATININE 0.79 09/02/2021  ? BILITOT 0.6 09/02/2021  ? ALKPHOS 105 09/02/2021  ?

## 2021-12-16 ENCOUNTER — Ambulatory Visit: Payer: No Typology Code available for payment source | Admitting: Nutrition

## 2021-12-24 ENCOUNTER — Telehealth: Payer: Self-pay | Admitting: Nurse Practitioner

## 2021-12-24 ENCOUNTER — Other Ambulatory Visit: Payer: Self-pay

## 2021-12-24 DIAGNOSIS — F419 Anxiety disorder, unspecified: Secondary | ICD-10-CM

## 2021-12-24 DIAGNOSIS — I10 Essential (primary) hypertension: Secondary | ICD-10-CM

## 2021-12-24 MED ORDER — LOSARTAN POTASSIUM 50 MG PO TABS: 50.0000 mg | ORAL_TABLET | Freq: Every day | ORAL | 0 refills | Status: DC

## 2021-12-24 MED ORDER — ESCITALOPRAM OXALATE 20 MG PO TABS: ORAL_TABLET | ORAL | 1 refills | Status: DC

## 2021-12-24 NOTE — Telephone Encounter (Signed)
Patient needs refill on  ? ?escitalopram (LEXAPRO) 20 MG tablet  ? ?losartan (COZAAR) 50 MG tablet ?

## 2021-12-24 NOTE — Telephone Encounter (Signed)
Refills sent

## 2021-12-28 ENCOUNTER — Ambulatory Visit (INDEPENDENT_AMBULATORY_CARE_PROVIDER_SITE_OTHER): Payer: No Typology Code available for payment source | Admitting: Rheumatology

## 2021-12-28 ENCOUNTER — Encounter: Payer: Self-pay | Admitting: Rheumatology

## 2021-12-28 VITALS — BP 150/72 | HR 92 | Ht 65.5 in | Wt 170.0 lb

## 2021-12-28 DIAGNOSIS — E782 Mixed hyperlipidemia: Secondary | ICD-10-CM | POA: Diagnosis not present

## 2021-12-28 DIAGNOSIS — R7989 Other specified abnormal findings of blood chemistry: Secondary | ICD-10-CM

## 2021-12-28 DIAGNOSIS — M5136 Other intervertebral disc degeneration, lumbar region: Secondary | ICD-10-CM

## 2021-12-28 DIAGNOSIS — M25511 Pain in right shoulder: Secondary | ICD-10-CM

## 2021-12-28 DIAGNOSIS — E1165 Type 2 diabetes mellitus with hyperglycemia: Secondary | ICD-10-CM

## 2021-12-28 DIAGNOSIS — G4709 Other insomnia: Secondary | ICD-10-CM

## 2021-12-28 DIAGNOSIS — R768 Other specified abnormal immunological findings in serum: Secondary | ICD-10-CM | POA: Diagnosis not present

## 2021-12-28 DIAGNOSIS — I10 Essential (primary) hypertension: Secondary | ICD-10-CM

## 2021-12-28 DIAGNOSIS — F419 Anxiety disorder, unspecified: Secondary | ICD-10-CM | POA: Diagnosis not present

## 2021-12-28 DIAGNOSIS — E559 Vitamin D deficiency, unspecified: Secondary | ICD-10-CM

## 2021-12-28 DIAGNOSIS — Z79899 Other long term (current) drug therapy: Secondary | ICD-10-CM

## 2021-12-28 DIAGNOSIS — F32A Depression, unspecified: Secondary | ICD-10-CM | POA: Diagnosis not present

## 2022-01-05 ENCOUNTER — Other Ambulatory Visit: Payer: Self-pay | Admitting: Rheumatology

## 2022-01-05 ENCOUNTER — Ambulatory Visit (HOSPITAL_COMMUNITY)
Admission: RE | Admit: 2022-01-05 | Discharge: 2022-01-05 | Disposition: A | Discharge status: Home / Self Care | Payer: No Typology Code available for payment source | Source: Ambulatory Visit | Attending: Rheumatology | Admitting: Rheumatology

## 2022-01-05 DIAGNOSIS — M25511 Pain in right shoulder: Secondary | ICD-10-CM | POA: Diagnosis not present

## 2022-01-05 DIAGNOSIS — M65811 Other synovitis and tenosynovitis, right shoulder: Secondary | ICD-10-CM | POA: Diagnosis not present

## 2022-01-05 DIAGNOSIS — M19011 Primary osteoarthritis, right shoulder: Secondary | ICD-10-CM | POA: Diagnosis not present

## 2022-01-05 NOTE — Progress Notes (Unsigned)
Office Visit Note  Patient: Laura Scott             Date of Birth: 15-Nov-1960           MRN: [id number]             PCP: Renee Rival, FNP Referring: Renee Rival, FNP Visit Date: 01/19/2022 Occupation: '@GUAROCC' @  Subjective:  Discuss treatment options   History of Present Illness: SHONNA DEITER is a 61 y.o. female with history of positive ANA and DDD.  Patient presents today to discuss recent right shoulder MRI results from 01/05/2022.  The patient has been experiencing intermittent discomfort in the right sternoclavicular joint for the past 3 to 4 months.  These episodes have been infrequent and mild.  The episodes typically only last a few hours and then are self resolving.  Her discomfort does not interfere with her activities of daily living.  She has not been taking any over-the-counter products for pain relief since her symptoms have been mild.  She has not had any pain at night or increased morning stiffness.  She denies any discomfort in the left sternoclavicular joint or left shoulder.  She denies any other joint pain or joint swelling at this time.    Activities of Daily Living:  Patient reports morning stiffness for a few minutes.   Patient Denies nocturnal pain.  Difficulty dressing/grooming: Denies Difficulty climbing stairs: Denies Difficulty getting out of chair: Denies Difficulty using hands for taps, buttons, cutlery, and/or writing: Denies  Review of Systems  Constitutional:  Positive for fatigue.  HENT:  Positive for nose dryness. Negative for mouth sores and mouth dryness.   Eyes:  Negative for pain, itching and dryness.  Respiratory:  Negative for shortness of breath and difficulty breathing.   Cardiovascular:  Negative for chest pain and palpitations.  Gastrointestinal:  Positive for diarrhea. Negative for blood in stool and constipation.  Endocrine: Negative for increased urination.  Genitourinary:  Negative for difficulty urinating.   Musculoskeletal:  Positive for joint pain, joint pain, joint swelling and morning stiffness. Negative for myalgias, muscle tenderness and myalgias.  Skin:  Negative for color change, rash and redness.  Allergic/Immunologic: Negative for susceptible to infections.  Neurological:  Positive for memory loss. Negative for dizziness, numbness, headaches and weakness.  Hematological:  Negative for bruising/bleeding tendency.  Psychiatric/Behavioral:  Positive for confusion.    PMFS History:  Patient Active Problem List   Diagnosis Date Noted   Annual physical exam 11/13/2021   Need for immunization against influenza 11/13/2021   Positive ANA (antinuclear antibody) 09/14/2021   Arthralgia of right acromioclavicular joint 09/14/2021   Collar bone pain 09/14/2021   Allergies 09/14/2021   Vitamin D deficiency 09/14/2021   Early satiety 07/30/2021   Abdominal pain, epigastric 07/30/2021   Abnormal LFTs 07/30/2021   Diarrhea 07/30/2021   Acute bacterial rhinosinusitis 06/25/2020   Cough 06/25/2020   Mixed hyperlipidemia 09/27/2019   Type 2 diabetes mellitus with hyperglycemia, without long-term current use of insulin ([place]) 01/08/2018   Anxiety and depression 05/17/2017   Prediabetes 10/26/2016   LUQ pain 09/28/2016   Rectal bleeding 09/28/2016   Constipation 09/28/2016   FHx: colonic polyps 09/28/2016   Dysphagia 09/28/2016   Anxiety 05/11/2015   Clinical depression 05/11/2015   Essential hypertension, benign 05/11/2015   Reflux 05/11/2015   Lumbar spondylosis 03/26/2014   Impaired fasting glucose 10/29/2013   Arthritis 10/29/2013   Chronic back pain 04/11/2013   Insomnia 04/11/2013  Past Medical History:  Diagnosis Date   Allergic rhinitis    Anxiety    Chronic pain    Depression    Diabetes mellitus without complication (HCC)    GERD (gastroesophageal reflux disease)    Headache    Hyperlipidemia    Hypertension    IBS (irritable bowel syndrome)    PMS (premenstrual  syndrome)    Reflux     Family History  Problem Relation Age of Onset   Hypertension Mother    Colon polyps Mother    Hypertension Father    Diabetes Father    Hypertension Sister    Hyperlipidemia Sister    Colon polyps Brother    Arthritis Brother    Diabetes Maternal Aunt    Hypertension Maternal Aunt    Rheum arthritis Maternal Grandmother    Prostate cancer Maternal Grandfather    Cancer Paternal Grandfather        not sure of type of cancer   Breast cancer Neg Hx    Cervical cancer Neg Hx    Past Surgical History:  Procedure Laterality Date   COLONOSCOPY  08/29/2011   [name]: normal. high risk screening tcs in 5 years.    COLONOSCOPY WITH PROPOFOL N/A 10/20/2016   Procedure: COLONOSCOPY WITH PROPOFOL;  Surgeon: Daneil Dolin, MD;  [facility];  Service: Endoscopy;  Laterality: N/A;  10:45am   ESOPHAGOGASTRODUODENOSCOPY (EGD) WITH PROPOFOL N/A 10/20/2016   Procedure: ESOPHAGOGASTRODUODENOSCOPY (EGD) WITH PROPOFOL;  Surgeon: Daneil Dolin, MD;  [facility]: [facility];  Service: Endoscopy;  Laterality: N/A;   LUMBAR [place] N/A 10/20/2016   Procedure: Venia Minks DILATION;  Surgeon: Daneil Dolin, MD;  Location: [facility];  Service: Endoscopy;  Laterality: N/A;   POLYPECTOMY  10/20/2016   Procedure: POLYPECTOMY;  Surgeon: Daneil Dolin, MD;  Location: [facility];  Service: Endoscopy;;  cecal and sigmoid polypectomies   Social History   Social History Narrative   Lives with her brother. Pt is on disability from her lumbar fusion.    Immunization History  Administered Date(s) Administered   Influenza,inj,Quad PF,6+ Mos 05/31/2018, 08/07/2021, 11/12/2021   Influenza-Unspecified 05/23/2011   Moderna Covid-19 Vaccine Bivalent Booster 26yr & up 09/07/2021   Moderna Sars-Covid-2 Vaccination 11/02/2019, 12/04/2019, 08/06/2020   Pneumococcal Polysaccharide-23 08/20/2018     Objective: Vital Signs: BP (!) 169/74 (BP Location: Left Arm,  Patient Position: Sitting, Cuff Size: Normal)   Pulse 84   Ht 5' 5.5" (1.664 m)   Wt 173 lb (78.5 kg)   LMP 09/03/2013   BMI 28.35 kg/m    Physical Exam Vitals and nursing note reviewed.  Constitutional:      Appearance: She is well-developed.  HENT:     Head: Normocephalic and atraumatic.  Eyes:     Conjunctiva/sclera: Conjunctivae normal.  Cardiovascular:     Rate and Rhythm: Normal rate and regular rhythm.     Heart sounds: Normal heart sounds.  Pulmonary:     Effort: Pulmonary effort is normal.     Breath sounds: Normal breath sounds.  Abdominal:     General: Bowel sounds are normal.     Palpations: Abdomen is soft.  Musculoskeletal:     Cervical back: Normal range of motion.  Skin:    General: Skin is warm and dry.     Capillary Refill: Capillary refill takes less than 2 seconds.  Neurological:     Mental Status: She is alert and oriented  to person, place, and time.  Psychiatric:        Behavior: Behavior normal.     Musculoskeletal Exam: C-spine has good range of motion with no discomfort.  Shoulder joints have good range of motion with no discomfort or tenderness.  Right sternoclavicular joint thickening but no tenderness or synovitis noted.  Elbow joints, wrist joints, MCPs, PIPs, DIPs have good range of motion with no synovitis.  Complete fist formation bilaterally.  Hip joints have good range of motion with no groin pain.  Knee joints have good range of motion with no warmth or effusion.  Ankle joints have good range of motion with no tenderness or joint swelling.  No evidence of Achilles tendinitis or plantar fasciitis.  CDAI Exam: CDAI Score: -- Patient Global: --; Provider Global: -- Swollen: --; Tender: -- Joint Exam 01/19/2022   No joint exam has been documented for this visit   There is currently no information documented on the homunculus. Go to the Rheumatology activity and complete the homunculus joint exam.  Investigation: No additional  findings.  Imaging: MR SHOULDER RIGHT WO CONTRAST  Result Date: 01/06/2022 CLINICAL DATA:  Right sternoclavicular joint not and pain for 4 months EXAM: MRI OF THE RIGHT SHOULDER WITHOUT CONTRAST TECHNIQUE: Multiplanar, multisequence MR imaging of the shoulder was performed. No intravenous contrast was administered. COMPARISON:  None Available. FINDINGS: Bones/Joint/Cartilage No acute fracture or dislocation. Normal alignment. No joint effusion. Moderate arthropathy of the right sternoclavicular joint with synovitis. Mild arthropathy of the left acromioclavicular joint with mild synovitis. No erosive changes. At C6-7 there is a mild broad-based disc bulge. Muscles and Tendons Muscles are normal. Soft tissue No fluid collection or hematoma.  No soft tissue mass. IMPRESSION: 1. Moderate arthropathy of the right sternoclavicular joint with synovitis. Mild arthropathy of the left acromioclavicular joint with mild synovitis. This likely secondary to osteoarthritis versus an inflammatory arthropathy. Electronically Signed   By: Kathreen Devoid M.D.   On: 01/06/2022 16:28    Recent Labs: Lab Results  Component Value Date   WBC 7.6 09/02/2021   HGB 13.9 09/02/2021   PLT 377 09/02/2021   NA 139 09/02/2021   K 4.2 09/02/2021   CL 96 09/02/2021   CO2 25 09/02/2021   GLUCOSE 151 (H) 09/02/2021   BUN 8 09/02/2021   CREATININE 0.79 09/02/2021   BILITOT 0.6 09/02/2021   ALKPHOS 105 09/02/2021   AST 34 09/02/2021   ALT 29 09/02/2021   PROT 8.1 12/02/2021   ALBUMIN 4.9 09/02/2021   CALCIUM 10.0 09/02/2021   GFRAA 69 12/23/2019   QFTBGOLDPLUS NEGATIVE 12/02/2021    Speciality Comments: No specialty comments available.  Procedures:  No procedures performed Allergies: Sulfa antibiotics, Other, and Penicillins   Assessment / Plan:     Visit Diagnoses: Pain of right sternoclavicular joint - History of right sternoclavicular joint swelling and tenderness since January 2023: Patient presents today to  discuss MRI results of the right shoulder from 01/05/2022: Moderate arthropathy of the right sternoclavicular joint with synovitis.  Mild arthropathy of the left acromioclavicular joint with mild synovitis was noted.  Likely secondary to osteoarthritis versus an inflammatory arthropathy. Lab results from 12/02/2021 were reviewed today in the office as well: RF negative, ESR within normal limits, anti-CCP negative, HLA-B27 negative, ANA 1:1280NC, 1:320NH, ENA negative.  Discussed the concern for inflammatory arthritis such as rheumatoid arthritis or psoriatic arthritis.  She is not experiencing any other joint pain or joint swelling at this time.  On examination she has  synovial thickening over the right sternoclavicular joint but no synovitis was noted.  She has no tenderness over the sternoclavicular joint at this time.  Her symptoms have been mild and infrequent.  She has not needed to take any over-the-counter products since her symptoms are typically mild and self resolving. Different treatment options were discussed today.  We discussed trying a trial of sulfasalazine for management of inflammatory arthropathy.  She would like to hold off on starting systemic therapy as her symptoms are mild and infrequent.  Discussed scheduling an ultrasound-guided right sternoclavicular joint injection but she would like to hold off at this time since she is not experiencing any pain at this time.  Discussed the use of topical agents such as Voltaren gel.  A prescription for Voltaren gel will be sent to the pharmacy today which she can apply topically as needed for pain relief.  She was advised to notify us if her symptoms become more frequent or more severe or if she develops any other joint pain or joint swelling.  She will follow-up in the office in 6 months or sooner if needed.  High risk medication use: Discussed the possible use of sulfasalazine for management of inflammatory arthritis.  According to the patient she  previously had diarrhea when she took an antibiotic with penicillin.  She denies any other reaction to sulfa in the past.  She would like to hold off on starting sulfasalazine at this time due to the infrequency and tolerability of her symptoms. She is apprehensive to try methotrexate or leflunomide due to history of elevated LFTs.  Positive ANA (antinuclear antibody) - ANA 1: 1280 centromere, 1: 320 homogeneous, ENA negative.  No clinical features of systemic lupus at this time.  No signs of sclerodactyly or Raynaud's phenomenon noted.  DDD (degenerative disc disease), lumbar - Status post fusion by Dr. Christella Noa.  Other medical conditions are listed as follows:  Essential hypertension, benign  Mixed hyperlipidemia  Type 2 diabetes mellitus with hyperglycemia, without long-term current use of insulin (HCC)  Anxiety and depression  Vitamin D deficiency  Other insomnia  Orders: No orders of the defined types were placed in this encounter.  No orders of the defined types were placed in this encounter.    Follow-Up Instructions: Return in 6 months (on 07/21/2022) for +ANA, DDD.   Ofilia Neas, PA-C  Note - This record has been created using Dragon software.  Chart creation errors have been sought, but may not always  have been located. Such creation errors do not reflect on  the standard of medical care.

## 2022-01-06 NOTE — Progress Notes (Signed)
MRI of the sternoclavicular joint shows inflammatory arthritis.  I discussed the MRI results with the patient.Marland Kitchen

## 2022-01-19 ENCOUNTER — Ambulatory Visit (INDEPENDENT_AMBULATORY_CARE_PROVIDER_SITE_OTHER): Payer: No Typology Code available for payment source | Admitting: Physician Assistant

## 2022-01-19 ENCOUNTER — Other Ambulatory Visit: Payer: Self-pay

## 2022-01-19 ENCOUNTER — Ambulatory Visit (INDEPENDENT_AMBULATORY_CARE_PROVIDER_SITE_OTHER): Payer: No Typology Code available for payment source | Admitting: *Deleted

## 2022-01-19 ENCOUNTER — Encounter: Payer: Self-pay | Admitting: Physician Assistant

## 2022-01-19 VITALS — Ht 65.5 in | Wt 170.0 lb

## 2022-01-19 VITALS — BP 169/74 | HR 84 | Ht 65.5 in | Wt 173.0 lb

## 2022-01-19 DIAGNOSIS — R768 Other specified abnormal immunological findings in serum: Secondary | ICD-10-CM

## 2022-01-19 DIAGNOSIS — F32A Depression, unspecified: Secondary | ICD-10-CM

## 2022-01-19 DIAGNOSIS — E782 Mixed hyperlipidemia: Secondary | ICD-10-CM

## 2022-01-19 DIAGNOSIS — G4709 Other insomnia: Secondary | ICD-10-CM

## 2022-01-19 DIAGNOSIS — E1165 Type 2 diabetes mellitus with hyperglycemia: Secondary | ICD-10-CM

## 2022-01-19 DIAGNOSIS — M51369 Other intervertebral disc degeneration, lumbar region without mention of lumbar back pain or lower extremity pain: Secondary | ICD-10-CM

## 2022-01-19 DIAGNOSIS — M5136 Other intervertebral disc degeneration, lumbar region: Secondary | ICD-10-CM | POA: Diagnosis not present

## 2022-01-19 DIAGNOSIS — M1990 Unspecified osteoarthritis, unspecified site: Secondary | ICD-10-CM

## 2022-01-19 DIAGNOSIS — M25511 Pain in right shoulder: Secondary | ICD-10-CM | POA: Diagnosis not present

## 2022-01-19 DIAGNOSIS — F419 Anxiety disorder, unspecified: Secondary | ICD-10-CM | POA: Diagnosis not present

## 2022-01-19 DIAGNOSIS — E559 Vitamin D deficiency, unspecified: Secondary | ICD-10-CM | POA: Diagnosis not present

## 2022-01-19 DIAGNOSIS — I10 Essential (primary) hypertension: Secondary | ICD-10-CM

## 2022-01-19 DIAGNOSIS — Z79899 Other long term (current) drug therapy: Secondary | ICD-10-CM | POA: Diagnosis not present

## 2022-01-19 DIAGNOSIS — Z1211 Encounter for screening for malignant neoplasm of colon: Secondary | ICD-10-CM

## 2022-01-19 MED ORDER — DICLOFENAC SODIUM 1 % EX GEL: CUTANEOUS | 2 refills | Status: AC

## 2022-01-19 NOTE — Progress Notes (Signed)
OK to schedule. ASA 2.   BMP at pre-op.

## 2022-01-19 NOTE — Progress Notes (Signed)
Gastroenterology Pre-Procedure Review  Request Date: 01/19/2022 Requesting Physician: Vena Rua, FNP @ [facility], 5 year recall, Last TCS done 10/20/2016 by Dr. 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 Diabetes Melitis: no, not anymore 2. Joint replacements in the past 12 months: no 3. Major health problems in the past 3 months: no 4. Has an artificial valve or MVP: no 5. Has a defibrillator: no 6. Has been advised in past to take antibiotics in advance of a procedure like teeth cleaning: no 7. Family history of colon cancer: no  8. Alcohol Use: yes, 1 glass of wine a week 9. Illicit drug Use: no 10. History of sleep apnea: no  11. History of coronary artery or other vascular stents placed within the last 12 months: no 12. History of any prior anesthesia complications: no 13. Body mass index is 27.86 kg/m.    MEDICATIONS & ALLERGIES:    Patient reports the following regarding taking any blood thinners:   Plavix? no Aspirin? no Coumadin? no Brilinta? no Xarelto? no Eliquis? no Pradaxa? no Savaysa? no Effient? no  Patient confirms/reports the following medications:  Current Outpatient Medications  Medication Sig Dispense Refill   ACCU-CHEK GUIDE test strip CHECK BLOOD SUGAR ONCE  DAILY 100 strip 3   albuterol (VENTOLIN HFA) 108 (90 Base) MCG/ACT inhaler Inhale 1-2 puffs into the lungs every 6 (six) hours as needed for wheezing or shortness of breath. 18 g 0   atorvastatin (LIPITOR) 20 MG tablet Take 1 tablet (20 mg total) by mouth daily. 90 tablet 1   blood glucose meter kit and supplies KIT Dispense based on patient and insurance preference. Tests sugar once a day. DX E11.9 1 each 0   budesonide-formoterol (SYMBICORT) 160-4.5 MCG/ACT  inhaler Inhale 2 puffs into the lungs 2 (two) times daily. 1 Inhaler 5   busPIRone (BUSPAR) 5 MG tablet TAKE (1) TABLET BY MOUTH (3) TIMES DAILY AS NEEDED FOR ANXIETY 90 tablet 5   Cholecalciferol (VITAMIN D) 125 MCG (5000 UT) CAPS Take 5,000 Units by mouth daily with breakfast. 30 capsule 2   Cyanocobalamin (VITAMIN B-12) 5000 MCG SUBL Place 1 tablet under the tongue daily.     escitalopram (LEXAPRO) 20 MG tablet TAKE 1 TABLET BY MOUTH  DAILY NEEDS OFFICE VISIT 90 tablet 1   fluticasone (FLONASE) 50 MCG/ACT nasal spray Place 2 sprays into both nostrils daily. (Patient taking differently: Place 2 sprays into both nostrils daily. As needed) 48 g 5   hydrochlorothiazide (HYDRODIURIL) 25 MG tablet TAKE 1 TABLET BY MOUTH ONCE DAILY AS NEEDED 90 tablet 1   hydrOXYzine (VISTARIL) 25 MG capsule TAKE 1 CAPSULE EVERY 8 HOURS AS NEEDED. 30 capsule 0   levocetirizine (XYZAL) 5 MG tablet Take 1 tablet (5 mg total) by mouth every evening. 90 tablet 0   losartan (COZAAR) 50 MG tablet Take 1 tablet (50 mg total) by mouth daily. 90 tablet 0   Microlet Lancets MISC USE TO TEST BLOOD SUGAR  ONCE DAILY 100 each 3   Omega-3 Fatty Acids (FISH OIL OMEGA-3 PO) Take 1 capsule by mouth daily.     omeprazole (PRILOSEC) 40 MG capsule Take 1 capsule (40 mg total) by mouth daily. 90 capsule 1   No current facility-administered medications for this visit.    Patient confirms/reports the  following allergies:  Allergies  Allergen Reactions   Sulfa Antibiotics Hives, Diarrhea and Itching   Other     SURGICAL TAPE Reaction: rash    Penicillins Hives, Itching and Rash    Pt states that she is able to take PCN  Has patient had a PCN reaction causing immediate rash, facial/tongue/throat swelling, SOB or lightheadedness with hypotension: Yes Has patient had a PCN reaction causing severe rash involving mucus membranes or skin necrosis: No Has patient had a PCN reaction that required hospitalization: No Has patient had a PCN  reaction occurring within the last 10 years: Yes If all of the above answers are "NO", then may proceed with Cephalosporin use.    No orders of the defined types were placed in this encounter.   AUTHORIZATION INFORMATION Primary Insurance: [employer],  ID #: [id number] Pre-Cert / Auth required: No, not required per Mitchel Honour / Auth #: REF: Armi S 11:41 01/20/2022  SCHEDULE INFORMATION: Procedure has been scheduled as follows:  Date: 02/09/2022, Time: 2:30 Location: [facility] with Dr. Gala Romney  This Gastroenterology Pre-Precedure Review Form is being routed to the following provider(s): Aliene Altes, PA-C

## 2022-01-19 NOTE — Progress Notes (Unsigned)
Please review and sign pended prescription. Thanks!

## 2022-01-19 NOTE — Patient Instructions (Addendum)
Standing Labs We placed an order today for your standing lab work.   Please have your standing labs drawn in 1 month then every 3 months   If possible, please have your labs drawn 2 weeks prior to your appointment so that the provider can discuss your results at your appointment.  Please note that you may see your imaging and lab results in [facility] before we have reviewed them. We may be awaiting multiple results to interpret others before contacting you. Please allow our office up to 72 hours to thoroughly review all of the results before contacting the office for clarification of your results.  We have open lab daily: Monday through Thursday from 1:30-4:30 PM and Friday from 1:30-4:00 PM at the office of Dr. Bo Merino, [facility].   Please be advised, all patients with office appointments requiring lab work will take precedent over walk-in lab work.  If possible, please come for your lab work on Monday and Friday afternoons, as you may experience shorter wait times. The office is located at [address] [place], [place], [place] [address] No appointment is necessary.   Labs are drawn by [facility]. Please bring your co-pay at the time of your lab draw.  You may receive a bill from [facility] for your lab work.  Please note if you are on Hydroxychloroquine and and an order has been placed for a Hydroxychloroquine level, you will need to have it drawn 4 hours or more after your last dose.  If you wish to have your labs drawn at another location, please call the office 24 hours in advance to send orders.  If you have any questions regarding directions or hours of operation,  please call [phone].   As a reminder, please drink plenty of water prior to coming for your lab work. Thanks!

## 2022-01-20 ENCOUNTER — Other Ambulatory Visit: Payer: Self-pay | Admitting: Nurse Practitioner

## 2022-01-20 ENCOUNTER — Encounter: Payer: Self-pay | Admitting: *Deleted

## 2022-01-20 ENCOUNTER — Other Ambulatory Visit: Payer: Self-pay | Admitting: *Deleted

## 2022-01-20 DIAGNOSIS — Z1211 Encounter for screening for malignant neoplasm of colon: Secondary | ICD-10-CM

## 2022-01-20 DIAGNOSIS — F32A Depression, unspecified: Secondary | ICD-10-CM

## 2022-01-20 MED ORDER — NA SULFATE-K SULFATE-MG SULF 17.5-3.13-1.6 GM/177ML PO SOLN
1.0000 | Freq: Once | ORAL | 0 refills | Status: AC
Start: 2022-01-20 — End: 2022-01-20

## 2022-01-20 NOTE — Addendum Note (Signed)
Addended by: Metro Kung on: 01/20/2022 08:52 AM   Modules accepted: Orders

## 2022-01-20 NOTE — Progress Notes (Signed)
Spoke to pt.  Scheduled procedure for 02/09/2022 at 2:30, arrival 1:00 at [facility].  Reviewed prep instructions with pt by phone.  Pt requested Suprep.  Sent RX to [facility].  Pt aware that she needs labs drawn at [facility] on 02/07/2022. Confirmed mailing address and mailed instructions.

## 2022-01-24 ENCOUNTER — Telehealth: Payer: Self-pay

## 2022-01-24 MED ORDER — PEG 3350-KCL-NA BICARB-NACL 420 G PO SOLR: 4000.0000 mL | ORAL | 0 refills | Status: DC

## 2022-01-24 NOTE — Telephone Encounter (Signed)
Pt called office, Suprep is too expensive. Requested cheaper prep. Rx for Trilyte sent to pharmacy. New instructions mailed with lab letter.

## 2022-01-26 DIAGNOSIS — H2512 Age-related nuclear cataract, left eye: Secondary | ICD-10-CM | POA: Diagnosis not present

## 2022-01-26 DIAGNOSIS — H52209 Unspecified astigmatism, unspecified eye: Secondary | ICD-10-CM | POA: Diagnosis not present

## 2022-01-26 DIAGNOSIS — H2511 Age-related nuclear cataract, right eye: Secondary | ICD-10-CM | POA: Diagnosis not present

## 2022-01-26 DIAGNOSIS — H5213 Myopia, bilateral: Secondary | ICD-10-CM | POA: Diagnosis not present

## 2022-01-26 DIAGNOSIS — H524 Presbyopia: Secondary | ICD-10-CM | POA: Diagnosis not present

## 2022-02-07 ENCOUNTER — Other Ambulatory Visit (HOSPITAL_COMMUNITY)
Admission: RE | Admit: 2022-02-07 | Discharge: 2022-02-07 | Disposition: A | Discharge status: Home / Self Care | Payer: No Typology Code available for payment source | Source: Ambulatory Visit | Attending: Internal Medicine | Admitting: Internal Medicine

## 2022-02-07 DIAGNOSIS — Z1211 Encounter for screening for malignant neoplasm of colon: Secondary | ICD-10-CM | POA: Insufficient documentation

## 2022-02-07 LAB — BASIC METABOLIC PANEL
Anion gap: 13 (ref 5–15)
BUN: 12 mg/dL (ref 6–20)
CO2: 24 mmol/L (ref 22–32)
Calcium: 9.4 mg/dL (ref 8.9–10.3)
Chloride: 102 mmol/L (ref 98–111)
Creatinine, Ser: 0.98 mg/dL (ref 0.44–1.00)
GFR, Estimated: >60 mL/min (ref >60)
Glucose, Bld: 167 mg/dL — ABNORMAL HIGH (ref 70–99)
Potassium: 3.3 mmol/L — ABNORMAL LOW (ref 3.5–5.1)
Sodium: 139 mmol/L (ref 135–145)

## 2022-02-09 ENCOUNTER — Other Ambulatory Visit: Payer: Self-pay

## 2022-02-09 ENCOUNTER — Ambulatory Visit (HOSPITAL_COMMUNITY): Payer: No Typology Code available for payment source | Admitting: Anesthesiology

## 2022-02-09 ENCOUNTER — Encounter (HOSPITAL_COMMUNITY): Payer: Self-pay | Admitting: Internal Medicine

## 2022-02-09 ENCOUNTER — Encounter (HOSPITAL_COMMUNITY)
Admission: RE | Disposition: A | Discharge status: Home / Self Care | Payer: Self-pay | Source: Home / Self Care | Attending: Internal Medicine

## 2022-02-09 ENCOUNTER — Ambulatory Visit (HOSPITAL_COMMUNITY)
Admission: RE | Admit: 2022-02-09 | Discharge: 2022-02-09 | Disposition: A | Discharge status: Home / Self Care | Payer: No Typology Code available for payment source | Attending: Internal Medicine | Admitting: Internal Medicine

## 2022-02-09 ENCOUNTER — Ambulatory Visit (HOSPITAL_BASED_OUTPATIENT_CLINIC_OR_DEPARTMENT_OTHER): Payer: No Typology Code available for payment source | Admitting: Anesthesiology

## 2022-02-09 DIAGNOSIS — R7989 Other specified abnormal findings of blood chemistry: Secondary | ICD-10-CM

## 2022-02-09 DIAGNOSIS — R197 Diarrhea, unspecified: Secondary | ICD-10-CM

## 2022-02-09 DIAGNOSIS — E782 Mixed hyperlipidemia: Secondary | ICD-10-CM

## 2022-02-09 DIAGNOSIS — R059 Cough, unspecified: Secondary | ICD-10-CM

## 2022-02-09 DIAGNOSIS — Z86010 Personal history of colonic polyps: Secondary | ICD-10-CM | POA: Diagnosis not present

## 2022-02-09 DIAGNOSIS — Z1211 Encounter for screening for malignant neoplasm of colon: Secondary | ICD-10-CM | POA: Diagnosis not present

## 2022-02-09 DIAGNOSIS — K5900 Constipation, unspecified: Secondary | ICD-10-CM

## 2022-02-09 DIAGNOSIS — M47816 Spondylosis without myelopathy or radiculopathy, lumbar region: Secondary | ICD-10-CM

## 2022-02-09 DIAGNOSIS — R7689 Other specified abnormal immunological findings in serum: Secondary | ICD-10-CM

## 2022-02-09 DIAGNOSIS — F418 Other specified anxiety disorders: Secondary | ICD-10-CM | POA: Insufficient documentation

## 2022-02-09 DIAGNOSIS — E119 Type 2 diabetes mellitus without complications: Secondary | ICD-10-CM | POA: Insufficient documentation

## 2022-02-09 DIAGNOSIS — R1012 Left upper quadrant pain: Secondary | ICD-10-CM

## 2022-02-09 DIAGNOSIS — R1310 Dysphagia, unspecified: Secondary | ICD-10-CM

## 2022-02-09 DIAGNOSIS — R1013 Epigastric pain: Secondary | ICD-10-CM

## 2022-02-09 DIAGNOSIS — B9689 Other specified bacterial agents as the cause of diseases classified elsewhere: Secondary | ICD-10-CM

## 2022-02-09 DIAGNOSIS — K648 Other hemorrhoids: Secondary | ICD-10-CM | POA: Insufficient documentation

## 2022-02-09 DIAGNOSIS — K5730 Diverticulosis of large intestine without perforation or abscess without bleeding: Secondary | ICD-10-CM

## 2022-02-09 DIAGNOSIS — I10 Essential (primary) hypertension: Secondary | ICD-10-CM

## 2022-02-09 DIAGNOSIS — Z7984 Long term (current) use of oral hypoglycemic drugs: Secondary | ICD-10-CM | POA: Insufficient documentation

## 2022-02-09 DIAGNOSIS — F419 Anxiety disorder, unspecified: Secondary | ICD-10-CM

## 2022-02-09 DIAGNOSIS — E1165 Type 2 diabetes mellitus with hyperglycemia: Secondary | ICD-10-CM

## 2022-02-09 DIAGNOSIS — Z0000 Encounter for general adult medical examination without abnormal findings: Secondary | ICD-10-CM

## 2022-02-09 DIAGNOSIS — T7840XA Allergy, unspecified, initial encounter: Secondary | ICD-10-CM

## 2022-02-09 DIAGNOSIS — Z8371 Family history of colonic polyps: Secondary | ICD-10-CM | POA: Diagnosis not present

## 2022-02-09 DIAGNOSIS — G8929 Other chronic pain: Secondary | ICD-10-CM

## 2022-02-09 DIAGNOSIS — R7303 Prediabetes: Secondary | ICD-10-CM

## 2022-02-09 DIAGNOSIS — M898X1 Other specified disorders of bone, shoulder: Secondary | ICD-10-CM

## 2022-02-09 DIAGNOSIS — G4700 Insomnia, unspecified: Secondary | ICD-10-CM

## 2022-02-09 DIAGNOSIS — K625 Hemorrhage of anus and rectum: Secondary | ICD-10-CM

## 2022-02-09 DIAGNOSIS — K219 Gastro-esophageal reflux disease without esophagitis: Secondary | ICD-10-CM | POA: Insufficient documentation

## 2022-02-09 DIAGNOSIS — J019 Acute sinusitis, unspecified: Secondary | ICD-10-CM

## 2022-02-09 DIAGNOSIS — F32A Depression, unspecified: Secondary | ICD-10-CM

## 2022-02-09 DIAGNOSIS — R768 Other specified abnormal immunological findings in serum: Secondary | ICD-10-CM

## 2022-02-09 DIAGNOSIS — M1990 Unspecified osteoarthritis, unspecified site: Secondary | ICD-10-CM | POA: Diagnosis not present

## 2022-02-09 DIAGNOSIS — M25511 Pain in right shoulder: Secondary | ICD-10-CM

## 2022-02-09 DIAGNOSIS — R7301 Impaired fasting glucose: Secondary | ICD-10-CM

## 2022-02-09 DIAGNOSIS — R6881 Early satiety: Secondary | ICD-10-CM

## 2022-02-09 DIAGNOSIS — E559 Vitamin D deficiency, unspecified: Secondary | ICD-10-CM

## 2022-02-09 DIAGNOSIS — Z23 Encounter for immunization: Secondary | ICD-10-CM

## 2022-02-09 DIAGNOSIS — Z83719 Family history of colon polyps, unspecified: Secondary | ICD-10-CM

## 2022-02-09 HISTORY — PX: COLONOSCOPY WITH PROPOFOL: SHX5780

## 2022-02-09 LAB — GLUCOSE, CAPILLARY: Glucose-Capillary: 196 mg/dL — ABNORMAL HIGH (ref 70–99)

## 2022-02-09 SURGERY — COLONOSCOPY WITH PROPOFOL
Anesthesia: General

## 2022-02-09 MED ORDER — PROPOFOL 500 MG/50ML IV EMUL
INTRAVENOUS | Status: DC | PRN
  Administered 2022-02-09: 150 ug/kg/min via INTRAVENOUS

## 2022-02-09 MED ORDER — STERILE WATER FOR IRRIGATION IR SOLN
Status: DC | PRN
  Administered 2022-02-09: 5 mL

## 2022-02-09 MED ORDER — PROPOFOL 500 MG/50ML IV EMUL
INTRAVENOUS | Status: AC
  Filled 2022-02-09: qty 50

## 2022-02-09 MED ORDER — PROPOFOL 10 MG/ML IV BOLUS
INTRAVENOUS | Status: DC | PRN
  Administered 2022-02-09: 60 mg via INTRAVENOUS

## 2022-02-09 MED ORDER — LACTATED RINGERS IV SOLN: INTRAVENOUS | Status: DC

## 2022-02-09 NOTE — Op Note (Signed)
[facility] Patient Name: Laura Scott Procedure Date: 02/09/2022 10:42 AM MRN: [id number] Date of Birth: 1961-07-26 Attending MD: Norvel Richards , MD CSN: [id number] Age: 61 Admit Type: Outpatient Procedure:                Colonoscopy Indications:              Colon cancer screening in patient at increased                            risk: Family history of 1st-degree relative with                            colon polyps, High risk colon cancer surveillance:                            Personal history of colonic polyps Providers:                Norvel Richards, MD, Lurline Del, RN, Randa Spike, Technician, Everardo Pacific Referring MD:              Medicines:                Propofol per Anesthesia Complications:            No immediate complications. Estimated Blood Loss:     Estimated blood loss: none. Procedure:                Pre-Anesthesia Assessment:                           - Prior to the procedure, a History and Physical                            was performed, and patient medications and                            allergies were reviewed. The patient's tolerance of                            previous anesthesia was also reviewed. The risks                            and benefits of the procedure and the sedation                            options and risks were discussed with the patient.                            All questions were answered, and informed consent                            was obtained. Prior Anticoagulants: The patient has  taken no previous anticoagulant or antiplatelet                            agents. ASA Grade Assessment: II - A patient with                            mild systemic disease. After reviewing the risks                            and benefits, the patient was deemed in                            satisfactory condition to undergo the procedure.                            After obtaining informed consent, the colonoscope                            was passed under direct vision. Throughout the                            procedure, the patient's blood pressure, pulse, and                            oxygen saturations were monitored continuously. The                            [id number]) scope was introduced through                            the anus and advanced to the the cecum, identified                            by appendiceal orifice and ileocecal valve. The                            ileocecal valve, appendiceal orifice, and rectum                            were photographed. Scope In: 11:07:53 AM Scope Out: 11:19:31 AM Scope Withdrawal Time: 0 hours 7 minutes 52 seconds  Total Procedure Duration: 0 hours 11 minutes 38 seconds  Findings:      The perianal and digital rectal examinations were normal.      Scattered small-mouthed diverticula were found in the sigmoid colon and       descending colon. Internal hemorrhoids.      The exam was otherwise without abnormality on direct and retroflexion       views. Impression:               - Diverticulosis in the sigmoid colon and in the                            descending colon. Internal hemorrhoids                           -  The examination was otherwise normal on direct                            and retroflexion views.                           - No specimens collected. Moderate Sedation:      Moderate (conscious) sedation was personally administered by an       anesthesia professional. The following parameters were monitored: oxygen       saturation, heart rate, blood pressure, respiratory rate, EKG, adequacy       of pulmonary ventilation, and response to care. Recommendation:           - Patient has a contact number available for                            emergencies. The signs and symptoms of potential                            delayed complications were discussed with the                             patient. Return to normal activities tomorrow.                            Written discharge instructions were provided to the                            patient.                           - Advance diet as tolerated.                           - Continue present medications.                           - Repeat colonoscopy in 7 years for screening                            purposes.                           - Return to GI office (date not yet determined). Procedure Code(s):        --- Professional ---                           [id number], Colonoscopy, flexible; diagnostic, including                            collection of specimen(s) by brushing or washing,                            when performed (separate procedure) Diagnosis Code(s):        --- Professional ---  Z83.71, Family history of colonic polyps                           Z86.010, Personal history of colonic polyps                           K57.30, Diverticulosis of large intestine without                            perforation or abscess without bleeding CPT copyright 2019 [employer]. All rights reserved. The codes documented in this report are preliminary and upon coder review may  be revised to meet current compliance requirements. Cristopher Estimable. Maisha Bogen, MD Norvel Richards, MD 02/09/2022 11:28:26 AM This report has been signed electronically. Number of Addenda: 0

## 2022-02-09 NOTE — Transfer of Care (Signed)
Immediate Anesthesia Transfer of Care Note  Patient: Laura Scott  Procedure(s) Performed: COLONOSCOPY WITH PROPOFOL  Patient Location: PACU  Anesthesia Type:General  Level of Consciousness: awake, alert  and oriented  Airway & Oxygen Therapy: Patient Spontanous Breathing  Post-op Assessment: Report given to RN, Post -op Vital signs reviewed and stable, Patient moving all extremities X 4 and Patient able to stick tongue midline  Post vital signs: Reviewed  Last Vitals:  Vitals Value Taken Time  BP 130/99 02/09/22 1122  Temp 36.7 C 02/09/22 1122  Pulse 65 02/09/22 1122  Resp 15 02/09/22 1122  SpO2 98 % 02/09/22 1122    Last Pain:  Vitals:   02/09/22 1122  TempSrc: Oral  PainSc: 0-No pain      Patients Stated Pain Goal: 7 (68/16/61 9694)  Complications: No notable events documented.

## 2022-02-09 NOTE — H&P (Signed)
'@LOGO' @   Primary Care Physician:  Renee Rival, FNP Primary Gastroenterologist:  Dr. Gala Romney  Pre-Procedure History & Physical: HPI:  Laura Scott is a 61 y.o. female is here for a screening colonoscopy.  Here for high risk screening (positive family history of colonic polyp and multiple first-degree family members).  Hyperplastic polyps found on 2018 colonoscopy.  No bowel symptoms at this time.  Past Medical History:  Diagnosis Date   Allergic rhinitis    Anxiety    Chronic pain    Depression    Diabetes mellitus without complication (HCC)    GERD (gastroesophageal reflux disease)    Hyperlipidemia    Hypertension    IBS (irritable bowel syndrome)    PMS (premenstrual syndrome)    Reflux     Past Surgical History:  Procedure Laterality Date   COLONOSCOPY  08/29/2011   [name]: normal. high risk screening tcs in 5 years.    COLONOSCOPY WITH PROPOFOL N/A 10/20/2016   Procedure: COLONOSCOPY WITH PROPOFOL;  Surgeon: Daneil Dolin, MD;  Location: [facility];  Service: Endoscopy;  Laterality: N/A;  10:45am   ESOPHAGOGASTRODUODENOSCOPY (EGD) WITH PROPOFOL N/A 10/20/2016   Procedure: ESOPHAGOGASTRODUODENOSCOPY (EGD) WITH PROPOFOL;  Surgeon: Daneil Dolin, MD;  Location: [facility];  Service: Endoscopy;  Laterality: N/A;   LUMBAR Brownlee N/A 10/20/2016   Procedure: Venia Minks DILATION;  Surgeon: Daneil Dolin, MD;  Location: [facility];  Service: Endoscopy;  Laterality: N/A;   POLYPECTOMY  10/20/2016   Procedure: POLYPECTOMY;  Surgeon: Daneil Dolin, MD;  Location: [facility];  Service: Endoscopy;;  cecal and sigmoid polypectomies    Prior to Admission medications   Medication Sig Start Date End Date Taking? Authorizing Provider  atorvastatin (LIPITOR) 20 MG tablet Take 1 tablet (20 mg total) by mouth daily. 04/22/21  Yes Taylor, Malena M, DO  busPIRone (BUSPAR) 5 MG tablet TAKE (1) TABLET BY MOUTH (3) TIMES DAILY AS NEEDED FOR ANXIETY 11/12/21   Yes Paseda, Dewaine Conger, FNP  cetirizine (ZYRTEC) 10 MG tablet Take 10 mg by mouth daily.   Yes [provider]  Cholecalciferol (VITAMIN D) 125 MCG (5000 UT) CAPS Take 5,000 Units by mouth daily with breakfast. 09/06/21  Yes Nida, Marella Chimes, MD  Cyanocobalamin (VITAMIN B-12) 5000 MCG SUBL Place 5,000 mcg under the tongue daily.   Yes [provider]  diclofenac Sodium (VOLTAREN) 1 % GEL Apply 2-4 grams to affected joint 4 times daily as needed. 01/19/22  Yes Ofilia Neas, PA-C  escitalopram (LEXAPRO) 20 MG tablet TAKE 1 TABLET BY MOUTH  DAILY NEEDS OFFICE VISIT 12/24/21  Yes Paseda, Dewaine Conger, FNP  fluticasone (FLONASE) 50 MCG/ACT nasal spray Place 2 sprays into both nostrils daily. Patient taking differently: Place 2 sprays into both nostrils daily as needed for allergies. 04/01/19  Yes Mikey Kirschner, MD  hydrochlorothiazide (HYDRODIURIL) 25 MG tablet TAKE 1 TABLET BY MOUTH ONCE DAILY AS NEEDED Patient taking differently: Take 25 mg by mouth daily as needed (Fluid). 11/12/21  Yes Paseda, Dewaine Conger, FNP  losartan (COZAAR) 50 MG tablet Take 1 tablet (50 mg total) by mouth daily. 12/24/21  Yes Paseda, Dewaine Conger, FNP  Melatonin 5 MG CHEW Chew 10 mg by mouth at bedtime as needed (sleep).   Yes [provider]  Omega-3 Fatty Acids (FISH OIL OMEGA-3 PO) Take 1 capsule by mouth daily.   Yes [provider]  omeprazole (PRILOSEC) 40 MG  capsule Take 1 capsule (40 mg total) by mouth daily. 11/12/21  Yes Paseda, Dewaine Conger, FNP  polyethylene glycol-electrolytes (TRILYTE) 420 g solution Take 4,000 mLs by mouth as directed. 01/24/22  Yes Jaquia Benedicto, Cristopher Estimable, MD  ACCU-CHEK GUIDE test strip CHECK BLOOD SUGAR ONCE  DAILY 04/07/20   Elvia Collum M, DO  albuterol (VENTOLIN HFA) 108 (90 Base) MCG/ACT inhaler Inhale 1-2 puffs into the lungs every 6 (six) hours as needed for wheezing or shortness of breath. 08/10/19   Avegno, Darrelyn Hillock, FNP  blood glucose meter kit and supplies  KIT Dispense based on patient and insurance preference. Tests sugar once a day. DX E11.9 01/08/18   Mikey Kirschner, MD  budesonide-formoterol [facility]) 160-4.5 MCG/ACT inhaler Inhale 2 puffs into the lungs 2 (two) times daily. Patient taking differently: Inhale 2 puffs into the lungs daily as needed (Shortness of breath). 09/20/19   Mikey Kirschner, MD  hydrOXYzine (VISTARIL) 25 MG capsule TAKE 1 CAPSULE EVERY 8 HOURS AS NEEDED. Patient not taking: Reported on 02/01/2022 01/20/22   Renee Rival, FNP  ibuprofen (ADVIL) 600 MG tablet Take 600 mg by mouth every 6 (six) hours as needed for mild pain or moderate pain.    [provider]  levocetirizine (XYZAL) 5 MG tablet Take 1 tablet (5 mg total) by mouth every evening. Patient not taking: Reported on 02/01/2022 10/28/21   Jaynee Eagles, PA-C  Microlet Lancets MISC USE TO TEST BLOOD SUGAR  ONCE DAILY 04/22/21   Elvia Collum M, DO    Allergies as of 01/20/2022 - Review Complete 01/19/2022  Allergen Reaction Noted   Sulfa antibiotics Hives, Diarrhea, and Itching 07/20/2011   Other  07/20/2011   Penicillins Hives, Itching, and Rash 03/15/2017    Family History  Problem Relation Age of Onset   Hypertension Mother    Colon polyps Mother    Hypertension Father    Diabetes Father    Hypertension Sister    Hyperlipidemia Sister    Colon polyps Brother    Arthritis Brother    Diabetes Maternal Aunt    Hypertension Maternal Aunt    Rheum arthritis Maternal Grandmother    Prostate cancer Maternal Grandfather    Cancer Paternal Grandfather        not sure of type of cancer   Breast cancer Neg Hx    Cervical cancer Neg Hx     Social History   Socioeconomic History   Marital status: Divorced    Spouse name: Not on file   Number of children: Not on file   Years of education: Not on file   Highest education level: Not on file  Occupational History   Not on file  Tobacco Use   Smoking status: Never    Passive exposure:  Current   Smokeless tobacco: Never  Vaping Use   Vaping Use: Never used  Substance and Sexual Activity   Alcohol use: Yes    Comment: occ   Drug use: No   Sexual activity: Yes    Birth control/protection: None, Post-menopausal  Other Topics Concern   Not on file  Social History Narrative   Lives with her brother. Pt is on disability from her lumbar fusion.    Social Determinants of Health   Financial Resource Strain: Not on file  Food Insecurity: Not on file  Transportation Needs: Not on file  Physical Activity: Not on file  Stress: Not on file  Social Connections: Not on file  Intimate Partner Violence: Not  on file    Review of Systems: See HPI, otherwise negative ROS  Physical Exam: BP (!) 151/70   Pulse 69   Temp 98 F (36.7 C) (Oral)   Resp 19   Ht 5' 5.5" (1.664 m)   Wt 78.5 kg   LMP 09/03/2013   SpO2 98%   BMI 28.35 kg/m  General:   Alert,  Well-developed, well-nourished, pleasant and cooperative in NAD Lungs:  Clear throughout to auscultation.   No wheezes, crackles, or rhonchi. No acute distress. Heart:  Regular rate and rhythm; no murmurs, clicks, rubs,  or gallops. Abdomen:  Soft, nontender and nondistended. No masses, hepatosplenomegaly or hernias noted. Normal bowel sounds, without guarding, and without rebound.    Impression/Plan: Laura Scott is now here to undergo a screening colonoscopy.  High risk screening examination  Risks, benefits, limitations, imponderables and alternatives regarding colonoscopy have been reviewed with the patient. Questions have been answered. All parties agreeable.     Notice:  This dictation was prepared with Dragon dictation along with smaller phrase technology. Any transcriptional errors that result from this process are unintentional and may not be corrected upon review.

## 2022-02-09 NOTE — Discharge Instructions (Signed)
  Colonoscopy Discharge Instructions  Read the instructions outlined below and refer to this sheet in the next few weeks. These discharge instructions provide you with general information on caring for yourself after you leave the hospital. Your doctor may also give you specific instructions. While your treatment has been planned according to the most current medical practices available, unavoidable complications occasionally occur. If you have any problems or questions after discharge, call Dr. Gala Romney at [phone]. ACTIVITY You may resume your regular activity, but move at a slower pace for the next 24 hours.  Take frequent rest periods for the next 24 hours.  Walking will help get rid of the air and reduce the bloated feeling in your belly (abdomen).  No driving for 24 hours (because of the medicine (anesthesia) used during the test).   Do not sign any important legal documents or operate any machinery for 24 hours (because of the anesthesia used during the test).  NUTRITION Drink plenty of fluids.  You may resume your normal diet as instructed by your doctor.  Begin with a light meal and progress to your normal diet. Heavy or fried foods are harder to digest and may make you feel sick to your stomach (nauseated).  Avoid alcoholic beverages for 24 hours or as instructed.  MEDICATIONS You may resume your normal medications unless your doctor tells you otherwise.  WHAT YOU CAN EXPECT TODAY Some feelings of bloating in the abdomen.  Passage of more gas than usual.  Spotting of blood in your stool or on the toilet paper.  IF YOU HAD POLYPS REMOVED DURING THE COLONOSCOPY: No aspirin products for 7 days or as instructed.  No alcohol for 7 days or as instructed.  Eat a soft diet for the next 24 hours.  FINDING OUT THE RESULTS OF YOUR TEST Not all test results are available during your visit. If your test results are not back during the visit, make an appointment with your caregiver to find out the  results. Do not assume everything is normal if you have not heard from your caregiver or the medical facility. It is important for you to follow up on all of your test results.  SEEK IMMEDIATE MEDICAL ATTENTION IF: You have more than a spotting of blood in your stool.  Your belly is swollen (abdominal distention).  You are nauseated or vomiting.  You have a temperature over 101.  You have abdominal pain or discomfort that is severe or gets worse throughout the day.     No polyps found today  Diverticulosis information provided  Glad you came off metformin.  It would probably help your bowel movements.  Trial of Benefiber 1 to 2 tablespoons every day will also help regulate bowel function  It is recommended you return in 7 years for repeat colonoscopy  At patient request, I called Nash Dimmer at [phone]  -reviewed findings and recommendations

## 2022-02-09 NOTE — Anesthesia Postprocedure Evaluation (Signed)
Anesthesia Post Note  Patient: Laura Scott  Procedure(s) Performed: COLONOSCOPY WITH PROPOFOL  Patient location during evaluation: Phase II Anesthesia Type: General Level of consciousness: awake and alert and oriented Pain management: pain level controlled Vital Signs Assessment: post-procedure vital signs reviewed and stable Respiratory status: spontaneous breathing, nonlabored ventilation and respiratory function stable Cardiovascular status: blood pressure returned to baseline and stable Postop Assessment: no apparent nausea or vomiting Anesthetic complications: no   No notable events documented.   Last Vitals:  Vitals:   02/09/22 0932 02/09/22 1122  BP: (!) 151/70 (!) 130/99  Pulse: 69 65  Resp: 19 15  Temp: 36.7 C 36.7 C  SpO2: 98% 98%    Last Pain:  Vitals:   02/09/22 1122  TempSrc: Oral  PainSc: 0-No pain                 [name] C Aleksander Edmiston

## 2022-02-09 NOTE — Anesthesia Preprocedure Evaluation (Addendum)
Anesthesia Evaluation  Patient identified by MRN, date of birth, ID band Patient awake    Reviewed: Allergy & Precautions, NPO status , Patient's Chart, lab work & pertinent test results  Airway Mallampati: III  TM Distance: >3 FB Neck ROM: Full    Dental  (+) Dental Advisory Given, Missing, Chipped   Pulmonary sleep apnea ,    Pulmonary exam normal breath sounds clear to auscultation       Cardiovascular Exercise Tolerance: Good hypertension, Pt. on medications Normal cardiovascular exam Rhythm:Regular Rate:Normal     Neuro/Psych PSYCHIATRIC DISORDERS Anxiety Depression negative neurological ROS     GI/Hepatic Neg liver ROS, GERD  Medicated and Controlled,  Endo/Other  diabetes, Well Controlled, Type 2, Oral Hypoglycemic Agents  Renal/GU negative Renal ROS  negative genitourinary   Musculoskeletal  (+) Arthritis , Osteoarthritis,    Abdominal   Peds negative pediatric ROS (+)  Hematology negative hematology ROS (+)   Anesthesia Other Findings   Reproductive/Obstetrics negative OB ROS                           Anesthesia Physical Anesthesia Plan  ASA: 2  Anesthesia Plan: General   Post-op Pain Management: Minimal or no pain anticipated   Induction: Intravenous  PONV Risk Score and Plan: Propofol infusion  Airway Management Planned: Nasal Cannula and Natural Airway  Additional Equipment:   Intra-op Plan:   Post-operative Plan:   Informed Consent: I have reviewed the patients History and Physical, chart, labs and discussed the procedure including the risks, benefits and alternatives for the proposed anesthesia with the patient or authorized representative who has indicated his/her understanding and acceptance.     Dental advisory given  Plan Discussed with: CRNA and Surgeon  Anesthesia Plan Comments:         Anesthesia Quick Evaluation

## 2022-02-16 ENCOUNTER — Encounter (HOSPITAL_COMMUNITY): Payer: Self-pay | Admitting: Internal Medicine

## 2022-02-16 ENCOUNTER — Other Ambulatory Visit: Payer: Self-pay | Admitting: Nurse Practitioner

## 2022-02-16 ENCOUNTER — Other Ambulatory Visit: Payer: Self-pay | Admitting: Internal Medicine

## 2022-02-16 DIAGNOSIS — F419 Anxiety disorder, unspecified: Secondary | ICD-10-CM

## 2022-02-16 DIAGNOSIS — E782 Mixed hyperlipidemia: Secondary | ICD-10-CM

## 2022-02-16 DIAGNOSIS — E1165 Type 2 diabetes mellitus with hyperglycemia: Secondary | ICD-10-CM

## 2022-02-28 ENCOUNTER — Ambulatory Visit (INDEPENDENT_AMBULATORY_CARE_PROVIDER_SITE_OTHER): Payer: No Typology Code available for payment source | Admitting: Family Medicine

## 2022-02-28 ENCOUNTER — Encounter: Payer: Self-pay | Admitting: Family Medicine

## 2022-02-28 ENCOUNTER — Telehealth: Payer: Self-pay | Admitting: Nurse Practitioner

## 2022-02-28 DIAGNOSIS — R058 Other specified cough: Secondary | ICD-10-CM | POA: Diagnosis not present

## 2022-02-28 DIAGNOSIS — U071 COVID-19: Secondary | ICD-10-CM

## 2022-02-28 MED ORDER — PROMETHAZINE-DM 6.25-15 MG/5ML PO SYRP: 1.2500 mL | ORAL_SOLUTION | Freq: Four times a day (QID) | ORAL | 0 refills | Status: DC | PRN

## 2022-02-28 MED ORDER — NIRMATRELVIR/RITONAVIR (PAXLOVID)TABLET: 3.0000 | ORAL_TABLET | Freq: Two times a day (BID) | ORAL | 0 refills | Status: AC

## 2022-02-28 MED ORDER — NIRMATRELVIR/RITONAVIR (PAXLOVID)TABLET: 3.0000 | ORAL_TABLET | Freq: Two times a day (BID) | ORAL | 0 refills | Status: DC

## 2022-02-28 MED ORDER — MOLNUPIRAVIR EUA 200MG CAPSULE: 4.0000 | ORAL_CAPSULE | Freq: Two times a day (BID) | ORAL | 0 refills | Status: AC

## 2022-02-28 NOTE — Progress Notes (Signed)
Virtual Visit via Telephone Note   This visit type was conducted due to national recommendations for restrictions regarding the COVID-19 Pandemic (e.g. social distancing) in an effort to limit this patient's exposure and mitigate transmission in our community.  Due to her co-morbid illnesses, this patient is at least at moderate risk for complications without adequate follow up.  This format is felt to be most appropriate for this patient at this time.  The patient did not have access to video technology/had technical difficulties with video requiring transitioning to audio format only (telephone).  All issues noted in this document were discussed and addressed.  No physical exam could be performed with this format.  Please refer to the patient's chart for her  consent to telehealth for [facility].   Evaluation Performed:  Follow-up visit  Date:  02/28/2022   ID:  Laura Scott [birth year], MRN [id number]  Patient Location: Home Provider Location: Office/Clinic  Participants: Patient Location of Patient: Home Location of Provider: Telehealth Consent was obtain for visit to be over via telehealth. I verified that I am speaking with the correct person using two identifiers.  PCP:  Renee Rival, FNP   Chief Complaint:  COVID19  History of Present Illness:    Laura Scott is a 61 y.o. female with tested COVID test on 02/27/22. She c/o of headaches, coughing, chills, and low-grade fever.   The patient does have symptoms concerning for COVID-19 infection (fever, chills, cough, or new shortness of breath).   Past Medical, Surgical, Social History, Allergies, and Medications have been Reviewed.  Past Medical History:  Diagnosis Date   Allergic rhinitis    Anxiety    Chronic pain    Depression    Diabetes mellitus without complication (HCC)    GERD (gastroesophageal reflux disease)    Hyperlipidemia    Hypertension    IBS (irritable bowel syndrome)    PMS  (premenstrual syndrome)    Reflux    Past Surgical History:  Procedure Laterality Date   COLONOSCOPY  08/29/2011   [name]: normal. high risk screening tcs in 5 years.    COLONOSCOPY WITH PROPOFOL N/A 10/20/2016   Procedure: COLONOSCOPY WITH PROPOFOL;  Surgeon: Daneil Dolin, MD;  [facility];  Service: Endoscopy;  Laterality: N/A;  10:45am   COLONOSCOPY WITH PROPOFOL N/A 02/09/2022   Procedure: COLONOSCOPY WITH PROPOFOL;  Surgeon: Daneil Dolin, MD;  [facility];  Service: Endoscopy;  Laterality: N/A;  2:30 / ASA 2   ESOPHAGOGASTRODUODENOSCOPY (EGD) WITH PROPOFOL N/A 10/20/2016   Procedure: ESOPHAGOGASTRODUODENOSCOPY (EGD) WITH PROPOFOL;  Surgeon: Daneil Dolin, MD;  [facility];  Service: Endoscopy;  Laterality: N/A;   LUMBAR [place] N/A 10/20/2016   Procedure: Venia Minks DILATION;  Surgeon: Daneil Dolin, MD;  [facility];  Service: Endoscopy;  Laterality: N/A;   POLYPECTOMY  10/20/2016   Procedure: POLYPECTOMY;  Surgeon: Daneil Dolin, MD;  [facility];  Service: Endoscopy;;  cecal and sigmoid polypectomies     Current Meds  Medication Sig   ACCU-CHEK GUIDE test strip CHECK BLOOD SUGAR ONCE  DAILY   albuterol (VENTOLIN HFA) 108 (90 Base) MCG/ACT inhaler Inhale 1-2 puffs into the lungs every 6 (six) hours as needed for wheezing or shortness of breath.   atorvastatin (LIPITOR) 20 MG tablet TAKE 1 TABLET AT BEDTIME.   blood glucose meter kit and supplies KIT Dispense based  on patient and insurance preference. Tests sugar once a day. DX E11.9   budesonide-formoterol (SYMBICORT) 160-4.5 MCG/ACT inhaler Inhale 2 puffs into the lungs 2 (two) times daily. (Patient taking differently: Inhale 2 puffs into the lungs daily as needed (Shortness of breath).)   busPIRone (BUSPAR) 5 MG tablet TAKE (1) TABLET BY MOUTH (3) TIMES DAILY AS NEEDED FOR ANXIETY   cetirizine (ZYRTEC) 10 MG tablet Take 10 mg by mouth daily.    Cholecalciferol (VITAMIN D) 125 MCG (5000 UT) CAPS Take 5,000 Units by mouth daily with breakfast.   Cyanocobalamin (VITAMIN B-12) 5000 MCG SUBL Place 5,000 mcg under the tongue daily.   diclofenac Sodium (VOLTAREN) 1 % GEL Apply 2-4 grams to affected joint 4 times daily as needed.   escitalopram (LEXAPRO) 20 MG tablet TAKE 1 TABLET BY MOUTH  DAILY NEEDS OFFICE VISIT   fluticasone (FLONASE) 50 MCG/ACT nasal spray Place 2 sprays into both nostrils daily. (Patient taking differently: Place 2 sprays into both nostrils daily as needed for allergies.)   hydrochlorothiazide (HYDRODIURIL) 25 MG tablet TAKE 1 TABLET BY MOUTH ONCE DAILY AS NEEDED (Patient taking differently: Take 25 mg by mouth daily as needed (Fluid).)   hydrOXYzine (VISTARIL) 25 MG capsule TAKE 1 CAPSULE EVERY 8 HOURS AS NEEDED.   ibuprofen (ADVIL) 600 MG tablet Take 600 mg by mouth every 6 (six) hours as needed for mild pain or moderate pain.   levocetirizine (XYZAL) 5 MG tablet Take 1 tablet (5 mg total) by mouth every evening.   losartan (COZAAR) 50 MG tablet Take 1 tablet (50 mg total) by mouth daily.   Melatonin 5 MG CHEW Chew 10 mg by mouth at bedtime as needed (sleep).   Microlet Lancets MISC USE TO TEST BLOOD SUGAR  ONCE DAILY   nirmatrelvir/ritonavir EUA (PAXLOVID) 20 x 150 MG & 10 x 100MG TABS Take 3 tablets by mouth 2 (two) times daily for 5 days. (Take nirmatrelvir 150 mg two tablets twice daily for 5 days and ritonavir 100 mg one tablet twice daily for 5 days) Patient GFR is >60   Omega-3 Fatty Acids (FISH OIL OMEGA-3 PO) Take 1 capsule by mouth daily.   omeprazole (PRILOSEC) 40 MG capsule Take 1 capsule (40 mg total) by mouth daily.   polyethylene glycol-electrolytes (TRILYTE) 420 g solution Take 4,000 mLs by mouth as directed.   promethazine-dextromethorphan (PROMETHAZINE-DM) 6.25-15 MG/5ML syrup Take 1.3 mLs by mouth 4 (four) times daily as needed for cough.     Allergies:   Sulfa antibiotics, Other, and Penicillins    ROS:   Please see the history of present illness.     All other systems reviewed and are negative.   Labs/Other Tests and Data Reviewed:    Recent Labs: 09/02/2021: ALT 29; Hemoglobin 13.9; Platelets 377 11/30/2021: TSH 1.620 02/07/2022: BUN 12; Creatinine, Ser 0.98; Potassium 3.3; Sodium 139   Recent Lipid Panel Lab Results  Component Value Date/Time   CHOL 192 11/30/2021 01:47 PM   TRIG 338 (H) 11/30/2021 01:47 PM   HDL 56 11/30/2021 01:47 PM   CHOLHDL 3.4 11/30/2021 01:47 PM   CHOLHDL 3.9 09/10/2014 08:05 AM   LDLCALC 82 11/30/2021 01:47 PM    Wt Readings from Last 3 Encounters:  02/09/22 173 lb (78.5 kg)  01/19/22 173 lb (78.5 kg)  01/19/22 170 lb (77.1 kg)     Objective:    Vital Signs:  LMP 09/03/2013      ASSESSMENT & PLAN:   Positive Covid-19 -tested positive for COVID  test on 02/27/22 - will treat with Paxlovid -Promethazine-DM ordered for cough -encouraged Rest and increasing fluid intake - recommended isolation for 5-7 days   Time:   Today, I have spent 10 minutes reviewing the chart, including problem list, medications, and with the patient with telehealth technology discussing the above problems.   Medication Adjustments/Labs and Tests Ordered: Current medicines are reviewed at length with the patient today.  Concerns regarding medicines are outlined above.   Tests Ordered: No orders of the defined types were placed in this encounter.   Medication Changes: Meds ordered this encounter  Medications   nirmatrelvir/ritonavir EUA (PAXLOVID) 20 x 150 MG & 10 x 100MG TABS    Sig: Take 3 tablets by mouth 2 (two) times daily for 5 days. (Take nirmatrelvir 150 mg two tablets twice daily for 5 days and ritonavir 100 mg one tablet twice daily for 5 days) Patient GFR is >60    Dispense:  30 tablet    Refill:  0   promethazine-dextromethorphan (PROMETHAZINE-DM) 6.25-15 MG/5ML syrup    Sig: Take 1.3 mLs by mouth 4 (four) times daily as needed for cough.     Dispense:  118 mL    Refill:  0     Note: This dictation was prepared with Dragon dictation along with smaller phrase technology. Similar sounding words can be transcribed inadequately or may not be corrected upon review. Any transcriptional errors that result from this process are unintentional.      Disposition:  Follow up  Signed, Alvira Monday, FNP  02/28/2022 12:00 PM     [place]

## 2022-02-28 NOTE — Telephone Encounter (Signed)
Please call [facility] and talk with Eustaquio Maize [phone] - They do not have paxlovid and also has questions about the dosage of the cough medicine called in.

## 2022-02-28 NOTE — Telephone Encounter (Signed)
Pt called stating that she received the cough syrup but not the other medication for COVID. Wants to know if you can please send?    molnupiravir EUA (LAGEVRIO) 200 mg CAPS capsule  [facility]

## 2022-02-28 NOTE — Telephone Encounter (Signed)
Returned call provided clarification for pharmacist over the phone.

## 2022-02-28 NOTE — Telephone Encounter (Signed)
Pt has been informed medication was called in, states she didn't receive it, Peter Congo advised if they didn't have the medication for pt to take tylenol for headaches and fever, pt stated understanding.

## 2022-02-28 NOTE — Addendum Note (Signed)
Addended by: Bolivar Haw on: 02/28/2022 05:01 PM   Modules accepted: Orders

## 2022-02-28 NOTE — Addendum Note (Signed)
Addended byAlvira Monday on: 02/28/2022 01:36 PM   Modules accepted: Orders

## 2022-03-21 ENCOUNTER — Other Ambulatory Visit: Payer: Self-pay

## 2022-03-21 DIAGNOSIS — R7989 Other specified abnormal findings of blood chemistry: Secondary | ICD-10-CM

## 2022-03-23 NOTE — Addendum Note (Signed)
Addended by: Mahala Menghini on: 03/23/2022 04:30 PM   Modules accepted: Orders

## 2022-03-29 DIAGNOSIS — R7989 Other specified abnormal findings of blood chemistry: Secondary | ICD-10-CM | POA: Diagnosis not present

## 2022-04-04 LAB — HEPATIC FUNCTION PANEL
ALT: 34 IU/L — ABNORMAL HIGH (ref 0–32)
AST: 35 IU/L (ref 0–40)
Albumin: 5 g/dL — ABNORMAL HIGH (ref 3.8–4.9)
Alkaline Phosphatase: 104 IU/L (ref 44–121)
Bilirubin Total: 0.8 mg/dL (ref 0.0–1.2)
Bilirubin, Direct: 0.19 mg/dL (ref 0.00–0.40)
Total Protein: 8.2 g/dL (ref 6.0–8.5)

## 2022-04-04 LAB — IGG, IGA, IGM
IgA/Immunoglobulin A, Serum: 511 mg/dL — ABNORMAL HIGH (ref 87–352)
IgG (Immunoglobin G), Serum: 1115 mg/dL (ref 586–1602)
IgM (Immunoglobulin M), Srm: 117 mg/dL (ref 26–217)

## 2022-04-04 LAB — HEMOCHROMATOSIS DNA-PCR(C282Y,H63D)

## 2022-04-05 ENCOUNTER — Other Ambulatory Visit: Payer: Self-pay | Admitting: Nurse Practitioner

## 2022-04-05 DIAGNOSIS — F32A Depression, unspecified: Secondary | ICD-10-CM

## 2022-04-07 ENCOUNTER — Other Ambulatory Visit: Payer: Self-pay

## 2022-04-07 DIAGNOSIS — R7989 Other specified abnormal findings of blood chemistry: Secondary | ICD-10-CM

## 2022-04-19 ENCOUNTER — Ambulatory Visit: Payer: No Typology Code available for payment source | Admitting: Nurse Practitioner

## 2022-04-26 ENCOUNTER — Encounter: Payer: Self-pay | Admitting: Nurse Practitioner

## 2022-04-26 ENCOUNTER — Ambulatory Visit (INDEPENDENT_AMBULATORY_CARE_PROVIDER_SITE_OTHER): Payer: No Typology Code available for payment source | Admitting: Nurse Practitioner

## 2022-04-26 VITALS — BP 142/70 | HR 83 | Ht 65.0 in | Wt 165.0 lb

## 2022-04-26 DIAGNOSIS — F419 Anxiety disorder, unspecified: Secondary | ICD-10-CM | POA: Diagnosis not present

## 2022-04-26 DIAGNOSIS — I10 Essential (primary) hypertension: Secondary | ICD-10-CM | POA: Diagnosis not present

## 2022-04-26 DIAGNOSIS — G4709 Other insomnia: Secondary | ICD-10-CM | POA: Diagnosis not present

## 2022-04-26 DIAGNOSIS — F32A Depression, unspecified: Secondary | ICD-10-CM

## 2022-04-26 DIAGNOSIS — E782 Mixed hyperlipidemia: Secondary | ICD-10-CM | POA: Diagnosis not present

## 2022-04-26 DIAGNOSIS — E663 Overweight: Secondary | ICD-10-CM | POA: Diagnosis not present

## 2022-04-26 MED ORDER — HYDROXYZINE PAMOATE 25 MG PO CAPS: ORAL_CAPSULE | ORAL | 1 refills | Status: DC

## 2022-04-26 MED ORDER — LOSARTAN POTASSIUM 100 MG PO TABS: 100.0000 mg | ORAL_TABLET | Freq: Every day | ORAL | 0 refills | Status: DC

## 2022-04-26 NOTE — Patient Instructions (Addendum)
Please get your shingles and TDAP vaccines at your pharmacy .   Please start taking losartan '100mg'$  daily , labs in 2 weeks, monitor blood pressure daily , keep a log and bring to next visit in 4 weeks Blood pressure goal is less than 130/80    It is important that you exercise regularly at least 30 minutes 5 times a week.  Think about what you will eat, plan ahead. Choose " clean, green, fresh or frozen" over canned, processed or packaged foods which are more sugary, salty and fatty. 70 to 75% of food eaten should be vegetables and fruit. Three meals at set times with snacks allowed between meals, but they must be fruit or vegetables. Aim to eat over a 12 hour period , example 7 am to 7 pm, and STOP after  your last meal of the day. Drink water,generally about 64 ounces per day, no other drink is as healthy. Fruit juice is best enjoyed in a healthy way, by EATING the fruit.  Thanks for choosing [facility], we consider it a privelige to serve you.

## 2022-04-26 NOTE — Assessment & Plan Note (Addendum)
On atorvastatin 20 mg daily, current medication check lipid panel at next visit,fatty fried foods Lab Results  Component Value Date   CHOL 192 11/30/2021   HDL 56 11/30/2021   LDLCALC 82 11/30/2021   TRIG 338 (H) 11/30/2021   CHOLHDL 3.4 11/30/2021

## 2022-04-26 NOTE — Assessment & Plan Note (Signed)
Wt Readings from Last 3 Encounters:  04/26/22 165 lb (74.8 kg)  02/09/22 173 lb (78.5 kg)  01/19/22 173 lb (78.5 kg)  Counseled on low-carb diet and exercising at least 150 minutes weekly as tolerated

## 2022-04-26 NOTE — Assessment & Plan Note (Signed)
hydroxyzine 25 mg 3 times daily as needed refilled Currently taking med for  anxiety this should also help with insomnia

## 2022-04-26 NOTE — Assessment & Plan Note (Signed)
BP Readings from Last 3 Encounters:  04/26/22 (!) 142/70  02/09/22 (!) 130/99  01/19/22 (!) 169/74  Chronic condition uncontrolled currently on losartan 50 mg daily, hydrochlorothiazide 25 mg daily Start losartan 100 mg daily continue hydrochlorothiazide 25 mg daily DASH diet advised engage in regular moderate exercise at least 150 minutes weekly BMP in 2 weeks BP goal is less than 130/80 Follow-up in 4 weeks

## 2022-04-26 NOTE — Assessment & Plan Note (Addendum)
Chronic Condition well controlled  currently on buspirone 5 mg TID Lexapro 20 mg daily Hydroxyzine 25 mg three times  daily as needed refilled.

## 2022-04-26 NOTE — Progress Notes (Signed)
   Laura Scott     MRN: [id number]      DOB: [birth year]   HPI Ms. Boal history of essential hypertension, type 2 diabetes, lumbar spondylosis, insomnia is here for follow up and re-evaluation of chronic medical conditions, medication management   Hypertension.  Currently on hydrochlorothiazide 25 mg daily, losartan 50 mg daily.  Patient states that he has been taking both medications as prescribed.  She did his edema, dizziness, syncope.   Hyperlipidemia.  Currently on atorvastatin 40 mg daily she denies muscle aches.  Insomnia.  Takes hydroxyzine 25 mg as needed, medication refilled today  Due for Tdap and shingles  vaccine patient encouraged to get the vaccine at his pharmacy.    ROS Denies recent fever or chills. Denies sinus pressure, nasal congestion, ear pain or sore throat. Denies chest congestion, productive cough or wheezing. Denies chest pains, palpitations and leg swelling Denies abdominal pain, nausea, vomiting,diarrhea or constipation.   Denies dysuria, frequency, hesitancy or incontinence. Denies joint pain, swelling and limitation in mobility. Denies headaches, seizures, numbness, or tingling.   PE  BP (!) 142/70   Pulse 83   Ht '5\' 5"'$  (1.651 m)   Wt 165 lb (74.8 kg)   LMP 09/03/2013   SpO2 97%   BMI 27.46 kg/m   Patient alert and oriented and in no cardiopulmonary distress.  Chest: Clear to auscultation bilaterally.  CVS: S1, S2 no murmurs, no S3.Regular rate.  ABD: Soft non tender.   Ext: No edema  MS: Adequate ROM spine, shoulders, hips and knees.   Psych: Good eye contact, normal affect. Memory intact not anxious or depressed appearing.  CNS: CN 2-12 intact, power,  normal throughout.no focal deficits noted.  Assessment & Plan  Essential hypertension, benign BP Readings from Last 3 Encounters:  04/26/22 (!) 142/70  02/09/22 (!) 130/99  01/19/22 (!) 169/74  Chronic condition uncontrolled currently on losartan 50 mg daily,  hydrochlorothiazide 25 mg daily Start losartan 100 mg daily continue hydrochlorothiazide 25 mg daily DASH diet advised engage in regular moderate exercise at least 150 minutes weekly BMP in 2 weeks BP goal is less than 130/80 Follow-up in 4 weeks  Anxiety and depression Chronic Condition well controlled  currently on buspirone 5 mg TID Lexapro 20 mg daily Hydroxyzine 25 mg three times  daily as needed refilled.  Mixed hyperlipidemia On atorvastatin 20 mg daily, current medication check lipid panel at next visit,fatty fried foods Lab Results  Component Value Date   CHOL 192 11/30/2021   HDL 56 11/30/2021   LDLCALC 82 11/30/2021   TRIG 338 (H) 11/30/2021   CHOLHDL 3.4 11/30/2021    Overweight (BMI 25.0-29.9) Wt Readings from Last 3 Encounters:  04/26/22 165 lb (74.8 kg)  02/09/22 173 lb (78.5 kg)  01/19/22 173 lb (78.5 kg)  Counseled on low-carb diet and exercising at least 150 minutes weekly as tolerated  Insomnia hydroxyzine 25 mg 3 times daily as needed refilled Currently taking med for  anxiety this should also help with insomnia

## 2022-05-10 ENCOUNTER — Other Ambulatory Visit: Payer: Self-pay

## 2022-05-10 DIAGNOSIS — E1165 Type 2 diabetes mellitus with hyperglycemia: Secondary | ICD-10-CM

## 2022-05-10 DIAGNOSIS — E782 Mixed hyperlipidemia: Secondary | ICD-10-CM

## 2022-05-13 ENCOUNTER — Telehealth: Payer: Self-pay | Admitting: Family Medicine

## 2022-05-13 NOTE — Telephone Encounter (Signed)
[phone] x [phone]   Called about a medication adherence. Wanting to know if patient needs to continue on this medication and if so could you please send refill to [facility]?

## 2022-05-16 NOTE — Telephone Encounter (Signed)
Tried calling number unable to get through.

## 2022-05-17 DIAGNOSIS — I10 Essential (primary) hypertension: Secondary | ICD-10-CM | POA: Diagnosis not present

## 2022-05-18 ENCOUNTER — Other Ambulatory Visit: Payer: Self-pay | Admitting: Nurse Practitioner

## 2022-05-18 DIAGNOSIS — E1165 Type 2 diabetes mellitus with hyperglycemia: Secondary | ICD-10-CM

## 2022-05-18 DIAGNOSIS — E782 Mixed hyperlipidemia: Secondary | ICD-10-CM

## 2022-05-18 LAB — BASIC METABOLIC PANEL
BUN/Creatinine Ratio: 11 — ABNORMAL LOW (ref 12–28)
BUN: 14 mg/dL (ref 8–27)
CO2: 22 mmol/L (ref 20–29)
Calcium: 9.7 mg/dL (ref 8.7–10.3)
Chloride: 99 mmol/L (ref 96–106)
Creatinine, Ser: 1.31 mg/dL — ABNORMAL HIGH (ref 0.57–1.00)
Glucose: 134 mg/dL — ABNORMAL HIGH (ref 70–99)
Potassium: 4 mmol/L (ref 3.5–5.2)
Sodium: 140 mmol/L (ref 134–144)
eGFR: 47 mL/min/{1.73_m2} — ABNORMAL LOW (ref >59)

## 2022-05-18 MED ORDER — LOSARTAN POTASSIUM 100 MG PO TABS: 50.0000 mg | ORAL_TABLET | Freq: Every day | ORAL | 0 refills | Status: DC

## 2022-05-18 NOTE — Telephone Encounter (Signed)
Spoke to pt went over labs and informed her of the change on losarton per [place].

## 2022-05-18 NOTE — Progress Notes (Signed)
Laura Scott Please tell patient to decrease dose of losartan to '50mg'$  daily and stop '100mg'$  daily due to a decrease in her kidney function. She should drink at least 64 ounces of water daily , avoid ibuprofen and aleeve  Follow up as planned.

## 2022-05-24 ENCOUNTER — Encounter: Payer: Self-pay | Admitting: Family Medicine

## 2022-05-24 ENCOUNTER — Ambulatory Visit (INDEPENDENT_AMBULATORY_CARE_PROVIDER_SITE_OTHER): Payer: No Typology Code available for payment source | Admitting: Family Medicine

## 2022-05-24 VITALS — BP 137/75 | HR 83 | Ht 65.0 in | Wt 164.0 lb

## 2022-05-24 DIAGNOSIS — I10 Essential (primary) hypertension: Secondary | ICD-10-CM

## 2022-05-24 NOTE — Assessment & Plan Note (Addendum)
Controlled She takes losartan 50 mg daily She denies headaches, dizziness, blurred vision, chest pain, palpitation, and shortness of breath Encouraged to continue treatment regimen No changes made to treatment regimen No rash noted on her feet bilaterally Encourage patient to use petroleum jelly as a moisturizer for her feet Encouraged to follow up if symptoms do not subside

## 2022-05-24 NOTE — Progress Notes (Signed)
[facility] Quality Team Note  Name: Laura Scott Date of Birth: 03/05/1961 MRN: [id number] Date: 05/24/2022  [facility] Quality Team has reviewed this patient's chart, please see recommendations below:  [facility] Quality Other; (KED: Kidney Health Evaluation Gap- Patient needs Urine Albumin Creatinine Ratio Test completed for gap closure. EGFR has already been completed, Patient has upcoming appointment with [place] today 05/24/2022 at 1:40P.)

## 2022-05-24 NOTE — Patient Instructions (Addendum)
I appreciate the opportunity to provide care to you today!    Follow up:  3 months  Labs:next visit     Please continue to a heart-healthy diet and increase your physical activities. Try to exercise for 2mns at least three times a week.      It was a pleasure to see you and I look forward to continuing to work together on your health and well-being. Please do not hesitate to call the office if you need care or have questions about your care.   Have a wonderful day and week. With Gratitude, GAlvira MondayMSN, FNP-BC

## 2022-05-24 NOTE — Progress Notes (Signed)
Established Patient Office Visit  Subjective:  Patient ID: Laura Scott, female    DOB: 08-27-1960  Age: 61 y.o. MRN: [id number]  CC:  Chief Complaint  Patient presents with   Follow-up    Both feet itching and dry on top  started 05/16/22    HPI Laura Scott is a 61 y.o. female with past medical history of hypertension, dysphagia presents for f/u of  chronic medical conditions.  Hypertension: She denies headaches, dizziness, blurred vision, chest pain, palpitation, and shortness of breath.  She takes losartan 50 mg daily and reports treatment compliance.  She complains of pruritus at the top of her feet, with no rash reported.  Past Medical History:  Diagnosis Date   Allergic rhinitis    Anxiety    Chronic pain    Depression    Diabetes mellitus without complication (HCC)    GERD (gastroesophageal reflux disease)    Hyperlipidemia    Hypertension    IBS (irritable bowel syndrome)    PMS (premenstrual syndrome)    Reflux     Past Surgical History:  Procedure Laterality Date   COLONOSCOPY  08/29/2011   [name]: normal. high risk screening tcs in 5 years.    COLONOSCOPY WITH PROPOFOL N/A 10/20/2016   Procedure: COLONOSCOPY WITH PROPOFOL;  Surgeon: Daneil Dolin, MD;  [facility];  Service: Endoscopy;  Laterality: N/A;  10:45am   COLONOSCOPY WITH PROPOFOL N/A 02/09/2022   Procedure: COLONOSCOPY WITH PROPOFOL;  Surgeon: Daneil Dolin, MD;  [facility];  Service: Endoscopy;  Laterality: N/A;  2:30 / ASA 2   ESOPHAGOGASTRODUODENOSCOPY (EGD) WITH PROPOFOL N/A 10/20/2016   Procedure: ESOPHAGOGASTRODUODENOSCOPY (EGD) WITH PROPOFOL;  Surgeon: Daneil Dolin, MD;  [facility];  Service: Endoscopy;  Laterality: N/A;   LUMBAR [place] N/A 10/20/2016   Procedure: Venia Minks DILATION;  Surgeon: Daneil Dolin, MD;  [facility];  Service: Endoscopy;  Laterality: N/A;   POLYPECTOMY  10/20/2016   Procedure: POLYPECTOMY;   Surgeon: Daneil Dolin, MD;  [facility];  Service: Endoscopy;;  cecal and sigmoid polypectomies    Family History  Problem Relation Age of Onset   Hypertension Mother    Colon polyps Mother    Hypertension Father    Diabetes Father    Hypertension Sister    Hyperlipidemia Sister    Colon polyps Brother    Arthritis Brother    Diabetes Maternal Aunt    Hypertension Maternal Aunt    Rheum arthritis Maternal Grandmother    Prostate cancer Maternal Grandfather    Cancer Paternal Grandfather        not sure of type of cancer   Breast cancer Neg Hx    Cervical cancer Neg Hx     Social History   Socioeconomic History   Marital status: Divorced    Spouse name: Not on file   Number of children: Not on file   Years of education: Not on file   Highest education level: Not on file  Occupational History   Not on file  Tobacco Use   Smoking status: Never    Passive exposure: Current   Smokeless tobacco: Never  Vaping Use   Vaping Use: Never used  Substance and Sexual Activity   Alcohol use: Yes    Comment: occ   Drug use: No   Sexual activity: Yes    Birth control/protection: None, Post-menopausal  Other  Topics Concern   Not on file  Social History Narrative   Lives with her brother. Pt is on disability from her lumbar fusion.    Social Determinants of Health   Financial Resource Strain: Not on file  Food Insecurity: Not on file  Transportation Needs: Not on file  Physical Activity: Not on file  Stress: Not on file  Social Connections: Not on file  Intimate Partner Violence: Not on file    Outpatient Medications Prior to Visit  Medication Sig Dispense Refill   albuterol (VENTOLIN HFA) 108 (90 Base) MCG/ACT inhaler Inhale 1-2 puffs into the lungs every 6 (six) hours as needed for wheezing or shortness of breath. 18 g 0   atorvastatin (LIPITOR) 20 MG tablet TAKE 1 TABLET AT BEDTIME. 90 tablet 0   budesonide-formoterol (SYMBICORT) 160-4.5 MCG/ACT inhaler  Inhale 2 puffs into the lungs 2 (two) times daily. (Patient taking differently: Inhale 2 puffs into the lungs daily as needed (Shortness of breath).) 1 Inhaler 5   busPIRone (BUSPAR) 5 MG tablet TAKE (1) TABLET BY MOUTH (3) TIMES DAILY AS NEEDED FOR ANXIETY 90 tablet 5   Cholecalciferol (VITAMIN D) 125 MCG (5000 UT) CAPS Take 5,000 Units by mouth daily with breakfast. 30 capsule 2   diclofenac Sodium (VOLTAREN) 1 % GEL Apply 2-4 grams to affected joint 4 times daily as needed. 400 g 2   escitalopram (LEXAPRO) 20 MG tablet TAKE 1 TABLET BY MOUTH  DAILY NEEDS OFFICE VISIT 90 tablet 1   fluticasone (FLONASE) 50 MCG/ACT nasal spray Place 2 sprays into both nostrils daily. (Patient taking differently: Place 2 sprays into both nostrils daily as needed for allergies.) 48 g 5   hydrochlorothiazide (HYDRODIURIL) 25 MG tablet TAKE 1 TABLET BY MOUTH ONCE DAILY AS NEEDED (Patient taking differently: Take 25 mg by mouth daily as needed (Fluid).) 90 tablet 1   hydrOXYzine (VISTARIL) 25 MG capsule TAKE 1 CAPSULE EVERY 8 HOURS AS NEEDED. 30 capsule 1   levocetirizine (XYZAL) 5 MG tablet Take 1 tablet (5 mg total) by mouth every evening. 90 tablet 0   losartan (COZAAR) 100 MG tablet Take 0.5 tablets (50 mg total) by mouth daily. 90 tablet 0   Omega-3 Fatty Acids (FISH OIL OMEGA-3 PO) Take 1 capsule by mouth daily.     omeprazole (PRILOSEC) 40 MG capsule Take 1 capsule (40 mg total) by mouth daily. 90 capsule 1   polyethylene glycol-electrolytes (TRILYTE) 420 g solution Take 4,000 mLs by mouth as directed. 4000 mL 0   ACCU-CHEK GUIDE test strip CHECK BLOOD SUGAR ONCE  DAILY (Patient not taking: Reported on 04/26/2022) 100 strip 3   blood glucose meter kit and supplies KIT Dispense based on patient and insurance preference. Tests sugar once a day. DX E11.9 (Patient not taking: Reported on 04/26/2022) 1 each 0   Cyanocobalamin (VITAMIN B-12) 5000 MCG SUBL Place 5,000 mcg under the tongue daily. (Patient not taking: Reported  on 04/26/2022)     ibuprofen (ADVIL) 600 MG tablet Take 600 mg by mouth every 6 (six) hours as needed for mild pain or moderate pain. (Patient not taking: Reported on 05/24/2022)     Melatonin 5 MG CHEW Chew 10 mg by mouth at bedtime as needed (sleep). (Patient not taking: Reported on 04/26/2022)     Microlet Lancets MISC USE TO TEST BLOOD SUGAR  ONCE DAILY (Patient not taking: Reported on 04/26/2022) 100 each 3   No facility-administered medications prior to visit.    Allergies  Allergen Reactions  Sulfa Antibiotics Hives, Diarrhea and Itching   Other     SURGICAL TAPE Reaction: rash    Penicillins Hives, Itching and Rash    Pt states that she is able to take PCN  Has patient had a PCN reaction causing immediate rash, facial/tongue/throat swelling, SOB or lightheadedness with hypotension: Yes Has patient had a PCN reaction causing severe rash involving mucus membranes or skin necrosis: No Has patient had a PCN reaction that required hospitalization: No Has patient had a PCN reaction occurring within the last 10 years: Yes If all of the above answers are "NO", then may proceed with Cephalosporin use.    ROS Review of Systems  Constitutional:  Negative for chills and fatigue.  Respiratory:  Negative for cough, chest tightness and shortness of breath.   Cardiovascular:  Negative for chest pain and palpitations.  Skin:  Negative for rash and wound.  Neurological:  Negative for dizziness and headaches.  Psychiatric/Behavioral:  Negative for self-injury and suicidal ideas.       Objective:    Physical Exam HENT:     Head: Normocephalic.     Right Ear: External ear normal.     Left Ear: External ear normal.  Cardiovascular:     Rate and Rhythm: Normal rate and regular rhythm.     Pulses: Normal pulses.     Heart sounds: Normal heart sounds.  Pulmonary:     Effort: Pulmonary effort is normal.     Breath sounds: Normal breath sounds.  Skin:    Findings: No erythema, lesion or  rash.  Neurological:     Mental Status: She is alert.     BP 137/75 (BP Location: Right Arm, Patient Position: Sitting)   Pulse 83   Ht 5' 5" (1.651 m)   Wt 164 lb (74.4 kg)   LMP 09/03/2013   SpO2 96%   BMI 27.29 kg/m  Wt Readings from Last 3 Encounters:  05/24/22 164 lb (74.4 kg)  04/26/22 165 lb (74.8 kg)  02/09/22 173 lb (78.5 kg)    Lab Results  Component Value Date   TSH 1.620 11/30/2021   Lab Results  Component Value Date   WBC 7.6 09/02/2021   HGB 13.9 09/02/2021   HCT 41.4 09/02/2021   MCV 93 09/02/2021   PLT 377 09/02/2021   Lab Results  Component Value Date   NA 140 05/17/2022   K 4.0 05/17/2022   CO2 22 05/17/2022   GLUCOSE 134 (H) 05/17/2022   BUN 14 05/17/2022   CREATININE 1.31 (H) 05/17/2022   BILITOT 0.8 03/29/2022   ALKPHOS 104 03/29/2022   AST 35 03/29/2022   ALT 34 (H) 03/29/2022   PROT 8.2 03/29/2022   ALBUMIN 5.0 (H) 03/29/2022   CALCIUM 9.7 05/17/2022   ANIONGAP 13 02/07/2022   EGFR 47 (L) 05/17/2022   Lab Results  Component Value Date   CHOL 192 11/30/2021   Lab Results  Component Value Date   HDL 56 11/30/2021   Lab Results  Component Value Date   LDLCALC 82 11/30/2021   Lab Results  Component Value Date   TRIG 338 (H) 11/30/2021   Lab Results  Component Value Date   CHOLHDL 3.4 11/30/2021   Lab Results  Component Value Date   HGBA1C 5.9 12/09/2021      Assessment & Plan:   Problem List Items Addressed This Visit       Cardiovascular and Mediastinum   Essential hypertension, benign - Primary    Controlled She  takes losartan 50 mg daily She denies headaches, dizziness, blurred vision, chest pain, palpitation, and shortness of breath Encouraged to continue treatment regimen No changes made to treatment regimen No rash noted on her feet bilaterally Encourage patient to use petroleum jelly as a moisturizer for her feet Encouraged to follow up if symptoms do not subside       No orders of the defined  types were placed in this encounter.   Follow-up: Return in about 3 months (around 08/24/2022).    Alvira Monday, FNP

## 2022-05-31 DIAGNOSIS — E1122 Type 2 diabetes mellitus with diabetic chronic kidney disease: Secondary | ICD-10-CM | POA: Diagnosis not present

## 2022-05-31 DIAGNOSIS — F324 Major depressive disorder, single episode, in partial remission: Secondary | ICD-10-CM | POA: Diagnosis not present

## 2022-05-31 DIAGNOSIS — N1831 Chronic kidney disease, stage 3a: Secondary | ICD-10-CM | POA: Diagnosis not present

## 2022-05-31 DIAGNOSIS — G4700 Insomnia, unspecified: Secondary | ICD-10-CM | POA: Diagnosis not present

## 2022-05-31 DIAGNOSIS — E663 Overweight: Secondary | ICD-10-CM | POA: Diagnosis not present

## 2022-05-31 DIAGNOSIS — E785 Hyperlipidemia, unspecified: Secondary | ICD-10-CM | POA: Diagnosis not present

## 2022-05-31 DIAGNOSIS — E1169 Type 2 diabetes mellitus with other specified complication: Secondary | ICD-10-CM | POA: Diagnosis not present

## 2022-05-31 DIAGNOSIS — Z008 Encounter for other general examination: Secondary | ICD-10-CM | POA: Diagnosis not present

## 2022-05-31 DIAGNOSIS — K219 Gastro-esophageal reflux disease without esophagitis: Secondary | ICD-10-CM | POA: Diagnosis not present

## 2022-05-31 DIAGNOSIS — I129 Hypertensive chronic kidney disease with stage 1 through stage 4 chronic kidney disease, or unspecified chronic kidney disease: Secondary | ICD-10-CM | POA: Diagnosis not present

## 2022-05-31 DIAGNOSIS — F419 Anxiety disorder, unspecified: Secondary | ICD-10-CM | POA: Diagnosis not present

## 2022-05-31 DIAGNOSIS — Z6827 Body mass index (BMI) 27.0-27.9, adult: Secondary | ICD-10-CM | POA: Diagnosis not present

## 2022-06-01 ENCOUNTER — Ambulatory Visit: Payer: No Typology Code available for payment source | Admitting: Internal Medicine

## 2022-06-07 ENCOUNTER — Other Ambulatory Visit: Payer: Self-pay

## 2022-06-07 DIAGNOSIS — R7989 Other specified abnormal findings of blood chemistry: Secondary | ICD-10-CM

## 2022-06-09 ENCOUNTER — Ambulatory Visit: Payer: No Typology Code available for payment source | Admitting: Internal Medicine

## 2022-06-14 ENCOUNTER — Ambulatory Visit: Payer: No Typology Code available for payment source | Admitting: "Endocrinology

## 2022-06-15 ENCOUNTER — Ambulatory Visit: Payer: No Typology Code available for payment source | Admitting: "Endocrinology

## 2022-07-12 NOTE — Progress Notes (Deleted)
Office Visit Note  Patient: Laura Scott             Date of Birth: 10-23-1960           MRN: [id number]             PCP: Alvira Monday, FNP Referring: Renee Rival, FNP Visit Date: 07/26/2022 Occupation: '@GUAROCC'$ @  Subjective:  No chief complaint on file.   History of Present Illness: Laura Scott is a 61 y.o. female ***   Activities of Daily Living:  Patient reports morning stiffness for *** {minute/hour:19697}.   Patient {ACTIONS;DENIES/REPORTS:21021675::"Denies"} nocturnal pain.  Difficulty dressing/grooming: {ACTIONS;DENIES/REPORTS:21021675::"Denies"} Difficulty climbing stairs: {ACTIONS;DENIES/REPORTS:21021675::"Denies"} Difficulty getting out of chair: {ACTIONS;DENIES/REPORTS:21021675::"Denies"} Difficulty using hands for taps, buttons, cutlery, and/or writing: {ACTIONS;DENIES/REPORTS:21021675::"Denies"}  No Rheumatology ROS completed.   PMFS History:  Patient Active Problem List   Diagnosis Date Noted  . Overweight (BMI 25.0-29.9) 04/26/2022  . Annual physical exam 11/13/2021  . Need for immunization against influenza 11/13/2021  . Positive ANA (antinuclear antibody) 09/14/2021  . Arthralgia of right acromioclavicular joint 09/14/2021  . Collar bone pain 09/14/2021  . Allergies 09/14/2021  . Vitamin D deficiency 09/14/2021  . Early satiety 07/30/2021  . Abdominal pain, epigastric 07/30/2021  . Abnormal LFTs 07/30/2021  . Diarrhea 07/30/2021  . Acute bacterial rhinosinusitis 06/25/2020  . Cough 06/25/2020  . Mixed hyperlipidemia 09/27/2019  . Type 2 diabetes mellitus with hyperglycemia, without long-term current use of insulin ([place]) 01/08/2018  . Anxiety and depression 05/17/2017  . Prediabetes 10/26/2016  . LUQ pain 09/28/2016  . Rectal bleeding 09/28/2016  . Constipation 09/28/2016  . FHx: colonic polyps 09/28/2016  . Dysphagia 09/28/2016  . Anxiety 05/11/2015  . Clinical depression 05/11/2015  . Essential hypertension, benign  05/11/2015  . Reflux 05/11/2015  . Lumbar spondylosis 03/26/2014  . Impaired fasting glucose 10/29/2013  . Arthritis 10/29/2013  . Chronic back pain 04/11/2013  . Insomnia 04/11/2013    Past Medical History:  Diagnosis Date  . Allergic rhinitis   . Anxiety   . Chronic pain   . Depression   . Diabetes mellitus without complication ([place])   . GERD (gastroesophageal reflux disease)   . Hyperlipidemia   . Hypertension   . IBS (irritable bowel syndrome)   . PMS (premenstrual syndrome)   . Reflux     Family History  Problem Relation Age of Onset  . Hypertension Mother   . Colon polyps Mother   . Hypertension Father   . Diabetes Father   . Hypertension Sister   . Hyperlipidemia Sister   . Colon polyps Brother   . Arthritis Brother   . Diabetes Maternal Aunt   . Hypertension Maternal Aunt   . Rheum arthritis Maternal Grandmother   . Prostate cancer Maternal Grandfather   . Cancer Paternal Grandfather        not sure of type of cancer  . Breast cancer Neg Hx   . Cervical cancer Neg Hx    Past Surgical History:  Procedure Laterality Date  . COLONOSCOPY  08/29/2011   [name]: normal. high risk screening tcs in 5 years.   . COLONOSCOPY WITH PROPOFOL N/A 10/20/2016   Procedure: COLONOSCOPY WITH PROPOFOL;  Surgeon: Daneil Dolin, MD;  [facility];  Service: Endoscopy;  Laterality: N/A;  10:45am  . COLONOSCOPY WITH PROPOFOL N/A 02/09/2022   Procedure: COLONOSCOPY WITH PROPOFOL;  Surgeon: Daneil Dolin, MD;  [facility];  Service: Endoscopy;  Laterality: N/A;  2:30 / ASA  2  . ESOPHAGOGASTRODUODENOSCOPY (EGD) WITH PROPOFOL N/A 10/20/2016   Procedure: ESOPHAGOGASTRODUODENOSCOPY (EGD) WITH PROPOFOL;  Surgeon: Daneil Dolin, MD;  [facility];  Service: Endoscopy;  Laterality: N/A;  . LUMBAR [place]    . MALONEY DILATION N/A 10/20/2016   Procedure: Venia Minks DILATION;  Surgeon: Daneil Dolin, MD;  [facility];  Service: Endoscopy;   Laterality: N/A;  . POLYPECTOMY  10/20/2016   Procedure: POLYPECTOMY;  Surgeon: Daneil Dolin, MD;  [facility];  Service: Endoscopy;;  cecal and sigmoid polypectomies   Social History   Social History Narrative   Lives with her brother. Pt is on disability from her lumbar fusion.    Immunization History  Administered Date(s) Administered  . Influenza,inj,Quad PF,6+ Mos 05/31/2018, 08/07/2021, 11/12/2021  . Influenza-Unspecified 05/23/2011  . Moderna Covid-19 Vaccine Bivalent Booster 3yr & up 09/07/2021  . Moderna Sars-Covid-2 Vaccination 11/02/2019, 12/04/2019, 08/06/2020  . Pneumococcal Polysaccharide-23 08/20/2018     Objective: Vital Signs: LMP 09/03/2013    Physical Exam   Musculoskeletal Exam: ***  CDAI Exam: CDAI Score: -- Patient Global: --; Provider Global: -- Swollen: --; Tender: -- Joint Exam 07/26/2022   No joint exam has been documented for this visit   There is currently no information documented on the homunculus. Go to the Rheumatology activity and complete the homunculus joint exam.  Investigation: No additional findings.  Imaging: No results found.  Recent Labs: Lab Results  Component Value Date   WBC 7.6 09/02/2021   HGB 13.9 09/02/2021   PLT 377 09/02/2021   NA 140 05/17/2022   K 4.0 05/17/2022   CL 99 05/17/2022   CO2 22 05/17/2022   GLUCOSE 134 (H) 05/17/2022   BUN 14 05/17/2022   CREATININE 1.31 (H) 05/17/2022   BILITOT 0.8 03/29/2022   ALKPHOS 104 03/29/2022   AST 35 03/29/2022   ALT 34 (H) 03/29/2022   PROT 8.2 03/29/2022   ALBUMIN 5.0 (H) 03/29/2022   CALCIUM 9.7 05/17/2022   GFRAA 69 12/23/2019   QFTBGOLDPLUS NEGATIVE 12/02/2021    Speciality Comments: No specialty comments available.  Procedures:  No procedures performed Allergies: Sulfa antibiotics, Other, and Penicillins   Assessment / Plan:     Visit Diagnoses: No diagnosis found.  Orders: No orders of the defined types were placed in this  encounter.  No orders of the defined types were placed in this encounter.   Face-to-face time spent with patient was *** minutes. Greater than 50% of time was spent in counseling and coordination of care.  Follow-Up Instructions: No follow-ups on file.   MEarnestine Mealing CMA  Note - This record has been created using DEditor, commissioning  Chart creation errors have been sought, but may not always  have been located. Such creation errors do not reflect on  the standard of medical care.

## 2022-07-16 ENCOUNTER — Other Ambulatory Visit: Payer: Self-pay | Admitting: Nurse Practitioner

## 2022-07-16 DIAGNOSIS — F419 Anxiety disorder, unspecified: Secondary | ICD-10-CM

## 2022-07-26 ENCOUNTER — Ambulatory Visit: Payer: No Typology Code available for payment source | Admitting: Rheumatology

## 2022-07-26 DIAGNOSIS — E782 Mixed hyperlipidemia: Secondary | ICD-10-CM

## 2022-07-26 DIAGNOSIS — M5136 Other intervertebral disc degeneration, lumbar region: Secondary | ICD-10-CM

## 2022-07-26 DIAGNOSIS — E1165 Type 2 diabetes mellitus with hyperglycemia: Secondary | ICD-10-CM | POA: Diagnosis not present

## 2022-07-26 DIAGNOSIS — F419 Anxiety disorder, unspecified: Secondary | ICD-10-CM

## 2022-07-26 DIAGNOSIS — I10 Essential (primary) hypertension: Secondary | ICD-10-CM

## 2022-07-26 DIAGNOSIS — E559 Vitamin D deficiency, unspecified: Secondary | ICD-10-CM

## 2022-07-26 DIAGNOSIS — G4709 Other insomnia: Secondary | ICD-10-CM

## 2022-07-26 DIAGNOSIS — M25511 Pain in right shoulder: Secondary | ICD-10-CM

## 2022-07-26 DIAGNOSIS — R768 Other specified abnormal immunological findings in serum: Secondary | ICD-10-CM

## 2022-07-26 DIAGNOSIS — Z79899 Other long term (current) drug therapy: Secondary | ICD-10-CM

## 2022-07-26 DIAGNOSIS — R7989 Other specified abnormal findings of blood chemistry: Secondary | ICD-10-CM | POA: Diagnosis not present

## 2022-07-27 ENCOUNTER — Other Ambulatory Visit: Payer: Self-pay | Admitting: Nurse Practitioner

## 2022-07-27 DIAGNOSIS — F32A Depression, unspecified: Secondary | ICD-10-CM

## 2022-07-27 LAB — COMPREHENSIVE METABOLIC PANEL
ALT: 40 IU/L — ABNORMAL HIGH (ref 0–32)
AST: 49 IU/L — ABNORMAL HIGH (ref 0–40)
Albumin/Globulin Ratio: 1.6 (ref 1.2–2.2)
Albumin: 4.9 g/dL (ref 3.9–4.9)
Alkaline Phosphatase: 97 IU/L (ref 44–121)
BUN/Creatinine Ratio: 11 — ABNORMAL LOW (ref 12–28)
BUN: 9 mg/dL (ref 8–27)
Bilirubin Total: 0.8 mg/dL (ref 0.0–1.2)
CO2: 24 mmol/L (ref 20–29)
Calcium: 10.3 mg/dL (ref 8.7–10.3)
Chloride: 98 mmol/L (ref 96–106)
Creatinine, Ser: 0.84 mg/dL (ref 0.57–1.00)
Globulin, Total: 3.1 g/dL (ref 1.5–4.5)
Glucose: 148 mg/dL — ABNORMAL HIGH (ref 70–99)
Potassium: 4.2 mmol/L (ref 3.5–5.2)
Sodium: 139 mmol/L (ref 134–144)
Total Protein: 8 g/dL (ref 6.0–8.5)
eGFR: 79 mL/min/{1.73_m2} (ref >59)

## 2022-07-27 LAB — LIPID PANEL
Chol/HDL Ratio: 3.9 ratio (ref 0.0–4.4)
Cholesterol, Total: 201 mg/dL — ABNORMAL HIGH (ref 100–199)
HDL: 52 mg/dL (ref >39)
LDL Chol Calc (NIH): 115 mg/dL — ABNORMAL HIGH (ref 0–99)
Triglycerides: 197 mg/dL — ABNORMAL HIGH (ref 0–149)
VLDL Cholesterol Cal: 34 mg/dL (ref 5–40)

## 2022-07-27 LAB — T4, FREE: Free T4: 1.12 ng/dL (ref 0.82–1.77)

## 2022-07-27 LAB — TSH: TSH: 2.59 u[IU]/mL (ref 0.450–4.500)

## 2022-07-29 LAB — HEPATIC FUNCTION PANEL
ALT: 39 IU/L — ABNORMAL HIGH (ref 0–32)
AST: 48 IU/L — ABNORMAL HIGH (ref 0–40)
Albumin: 4.9 g/dL (ref 3.9–4.9)
Alkaline Phosphatase: 94 IU/L (ref 44–121)
Bilirubin Total: 0.9 mg/dL (ref 0.0–1.2)
Bilirubin, Direct: 0.23 mg/dL (ref 0.00–0.40)

## 2022-07-29 LAB — IGG, IGA, IGM
IgA/Immunoglobulin A, Serum: 562 mg/dL — ABNORMAL HIGH (ref 87–352)
IgG (Immunoglobin G), Serum: 1254 mg/dL (ref 586–1602)
IgM (Immunoglobulin M), Srm: 118 mg/dL (ref 26–217)

## 2022-07-29 LAB — PROTEIN ELECTROPHORESIS, SERUM
A/G Ratio: 1.1 (ref 0.7–1.7)
Albumin ELP: 4.2 g/dL (ref 2.9–4.4)
Alpha 1: 0.2 g/dL (ref 0.0–0.4)
Alpha 2: 0.8 g/dL (ref 0.4–1.0)
Beta: 1.4 g/dL — ABNORMAL HIGH (ref 0.7–1.3)
Gamma Globulin: 1.3 g/dL (ref 0.4–1.8)
Globulin, Total: 3.7 g/dL (ref 2.2–3.9)
Total Protein: 7.9 g/dL (ref 6.0–8.5)

## 2022-07-29 LAB — FERRITIN: Ferritin: 588 ng/mL — ABNORMAL HIGH (ref 15–150)

## 2022-08-18 ENCOUNTER — Other Ambulatory Visit: Payer: Self-pay | Admitting: Nurse Practitioner

## 2022-08-18 DIAGNOSIS — E1165 Type 2 diabetes mellitus with hyperglycemia: Secondary | ICD-10-CM

## 2022-08-18 DIAGNOSIS — E782 Mixed hyperlipidemia: Secondary | ICD-10-CM

## 2022-08-19 ENCOUNTER — Encounter: Payer: Self-pay | Admitting: "Endocrinology

## 2022-08-19 ENCOUNTER — Ambulatory Visit (INDEPENDENT_AMBULATORY_CARE_PROVIDER_SITE_OTHER): Payer: No Typology Code available for payment source | Admitting: "Endocrinology

## 2022-08-19 VITALS — BP 148/68 | HR 80 | Ht 65.0 in | Wt 168.0 lb

## 2022-08-19 DIAGNOSIS — I10 Essential (primary) hypertension: Secondary | ICD-10-CM

## 2022-08-19 DIAGNOSIS — R748 Abnormal levels of other serum enzymes: Secondary | ICD-10-CM

## 2022-08-19 DIAGNOSIS — E782 Mixed hyperlipidemia: Secondary | ICD-10-CM

## 2022-08-19 DIAGNOSIS — E1165 Type 2 diabetes mellitus with hyperglycemia: Secondary | ICD-10-CM | POA: Diagnosis not present

## 2022-08-19 LAB — POCT GLYCOSYLATED HEMOGLOBIN (HGB A1C): HbA1c, POC (controlled diabetic range): 6.2 % (ref 0.0–7.0)

## 2022-08-19 NOTE — Progress Notes (Signed)
08/19/2022, 1:13 PM    Endocrinology follow-up note    Subjective:    Patient ID: Laura Scott, female    DOB: Nov 20, 1960.  Laura Scott is being seen in follow-up after she was seen in consultation for  management of currently controlled symptomatic diabetes requested by  Alvira Monday, [place].   Past Medical History:  Diagnosis Date   Allergic rhinitis    Anxiety    Chronic pain    Depression    Diabetes mellitus without complication (HCC)    GERD (gastroesophageal reflux disease)    Hyperlipidemia    Hypertension    IBS (irritable bowel syndrome)    PMS (premenstrual syndrome)    Reflux     Past Surgical History:  Procedure Laterality Date   COLONOSCOPY  08/29/2011   [name]: normal. high risk screening tcs in 5 years.    COLONOSCOPY WITH PROPOFOL N/A 10/20/2016   Procedure: COLONOSCOPY WITH PROPOFOL;  Surgeon: Daneil Dolin, MD;  [facility];  Service: Endoscopy;  Laterality: N/A;  10:45am   COLONOSCOPY WITH PROPOFOL N/A 02/09/2022   Procedure: COLONOSCOPY WITH PROPOFOL;  Surgeon: Daneil Dolin, MD;  [facility];  Service: Endoscopy;  Laterality: N/A;  2:30 / ASA 2   ESOPHAGOGASTRODUODENOSCOPY (EGD) WITH PROPOFOL N/A 10/20/2016   Procedure: ESOPHAGOGASTRODUODENOSCOPY (EGD) WITH PROPOFOL;  Surgeon: Daneil Dolin, MD;  [facility];  Service: Endoscopy;  Laterality: N/A;   LUMBAR [place] N/A 10/20/2016   Procedure: Venia Minks DILATION;  Surgeon: Daneil Dolin, MD;  [facility];  Service: Endoscopy;  Laterality: N/A;   POLYPECTOMY  10/20/2016   Procedure: POLYPECTOMY;  Surgeon: Daneil Dolin, MD;  [facility];  Service: Endoscopy;;  cecal and sigmoid polypectomies    Social History   Socioeconomic History   Marital status: Divorced    Spouse name: Not on file   Number of children: Not on file   Years  of education: Not on file   Highest education level: Not on file  Occupational History   Not on file  Tobacco Use   Smoking status: Never    Passive exposure: Current   Smokeless tobacco: Never  Vaping Use   Vaping Use: Never used  Substance and Sexual Activity   Alcohol use: Yes    Comment: occ   Drug use: No   Sexual activity: Yes    Birth control/protection: None, Post-menopausal  Other Topics Concern   Not on file  Social History Narrative   Lives with her brother. Pt is on disability from her lumbar fusion.    Social Determinants of Health   Financial Resource Strain: Not on file  Food Insecurity: Not on file  Transportation Needs: Not on file  Physical Activity: Not on file  Stress: Not on file  Social Connections: Not on file    Family History  Problem Relation Age of Onset   Hypertension Mother    Colon polyps Mother    Hypertension Father    Diabetes Father    Hypertension Sister    Hyperlipidemia  Sister    Colon polyps Brother    Arthritis Brother    Diabetes Maternal Aunt    Hypertension Maternal Aunt    Rheum arthritis Maternal Grandmother    Prostate cancer Maternal Grandfather    Cancer Paternal Grandfather        not sure of type of cancer   Breast cancer Neg Hx    Cervical cancer Neg Hx     Outpatient Encounter Medications as of 08/19/2022  Medication Sig   Cyanocobalamin (VITAMIN B-12) 5000 MCG SUBL Place 5,000 mcg under the tongue daily.   albuterol (VENTOLIN HFA) 108 (90 Base) MCG/ACT inhaler Inhale 1-2 puffs into the lungs every 6 (six) hours as needed for wheezing or shortness of breath.   atorvastatin (LIPITOR) 20 MG tablet TAKE 1 TABLET AT BEDTIME.   budesonide-formoterol (SYMBICORT) 160-4.5 MCG/ACT inhaler Inhale 2 puffs into the lungs 2 (two) times daily. (Patient taking differently: Inhale 2 puffs into the lungs daily as needed (Shortness of breath).)   busPIRone (BUSPAR) 5 MG tablet TAKE (1) TABLET BY MOUTH (3) TIMES DAILY AS NEEDED  FOR ANXIETY   Cholecalciferol (VITAMIN D) 125 MCG (5000 UT) CAPS Take 5,000 Units by mouth daily with breakfast.   diclofenac Sodium (VOLTAREN) 1 % GEL Apply 2-4 grams to affected joint 4 times daily as needed.   escitalopram (LEXAPRO) 20 MG tablet TAKE 1 TABLET BY MOUTH DAILY. NEEDS OFFICE VISIT.   fluticasone (FLONASE) 50 MCG/ACT nasal spray Place 2 sprays into both nostrils daily. (Patient taking differently: Place 2 sprays into both nostrils daily as needed for allergies.)   hydrochlorothiazide (HYDRODIURIL) 25 MG tablet TAKE 1 TABLET BY MOUTH ONCE DAILY AS NEEDED (Patient taking differently: Take 25 mg by mouth daily as needed (Fluid).)   hydrOXYzine (VISTARIL) 25 MG capsule TAKE 1 CAPSULE EVERY 8 HOURS AS NEEDED.   levocetirizine (XYZAL) 5 MG tablet Take 1 tablet (5 mg total) by mouth every evening.   losartan (COZAAR) 100 MG tablet Take 0.5 tablets (50 mg total) by mouth daily.   Omega-3 Fatty Acids (FISH OIL OMEGA-3 PO) Take 1 capsule by mouth daily.   omeprazole (PRILOSEC) 40 MG capsule Take 1 capsule (40 mg total) by mouth daily.   polyethylene glycol-electrolytes (TRILYTE) 420 g solution Take 4,000 mLs by mouth as directed.   [DISCONTINUED] ACCU-CHEK GUIDE test strip CHECK BLOOD SUGAR ONCE  DAILY (Patient not taking: Reported on 04/26/2022)   [DISCONTINUED] blood glucose meter kit and supplies KIT Dispense based on patient and insurance preference. Tests sugar once a day. DX E11.9 (Patient not taking: Reported on 04/26/2022)   [DISCONTINUED] ibuprofen (ADVIL) 600 MG tablet Take 600 mg by mouth every 6 (six) hours as needed for mild pain or moderate pain. (Patient not taking: Reported on 05/24/2022)   [DISCONTINUED] Melatonin 5 MG CHEW Chew 10 mg by mouth at bedtime as needed (sleep). (Patient not taking: Reported on 04/26/2022)   [DISCONTINUED] Microlet Lancets MISC USE TO TEST BLOOD SUGAR  ONCE DAILY (Patient not taking: Reported on 04/26/2022)   No facility-administered encounter medications on  file as of 08/19/2022.    ALLERGIES: Allergies  Allergen Reactions   Sulfa Antibiotics Hives, Diarrhea and Itching   Other     SURGICAL TAPE Reaction: rash    Penicillins Hives, Itching and Rash    Pt states that she is able to take PCN  Has patient had a PCN reaction causing immediate rash, facial/tongue/throat swelling, SOB or lightheadedness with hypotension: Yes Has patient had a PCN reaction  causing severe rash involving mucus membranes or skin necrosis: No Has patient had a PCN reaction that required hospitalization: No Has patient had a PCN reaction occurring within the last 10 years: Yes If all of the above answers are "NO", then may proceed with Cephalosporin use.    VACCINATION STATUS: Immunization History  Administered Date(s) Administered   Influenza,inj,Quad PF,6+ Mos 05/31/2018, 08/07/2021, 11/12/2021   Influenza-Unspecified 05/23/2011   Moderna Covid-19 Vaccine Bivalent Booster 75yr & up 09/07/2021   Moderna Sars-Covid-2 Vaccination 11/02/2019, 12/04/2019, 08/06/2020   Pneumococcal Polysaccharide-23 08/20/2018    Diabetes She presents for her follow-up diabetic visit. She has type 2 diabetes mellitus. Onset time: She was diagnosed at approximate age of 89years. Her disease course has been worsening. There are no hypoglycemic associated symptoms. Pertinent negatives for hypoglycemia include no confusion, headaches, pallor or seizures. There are no diabetic associated symptoms. Pertinent negatives for diabetes include no chest pain, no polydipsia, no polyphagia and no polyuria. There are no hypoglycemic complications. Symptoms are worsening. There are no diabetic complications. Risk factors for coronary artery disease include diabetes mellitus, dyslipidemia, family history, hypertension, post-menopausal and sedentary lifestyle. Current diabetic treatment includes oral agent (monotherapy). Her weight is fluctuating minimally. She is following a generally unhealthy diet.  When asked about meal planning, she reported none. She has not had a previous visit with a dietitian. She rarely participates in exercise. Her home blood glucose trend is fluctuating minimally. Her breakfast blood glucose range is generally 110-130 mg/dl. Her bedtime blood glucose range is generally 130-140 mg/dl. Her overall blood glucose range is 130-140 mg/dl. (She presents with controlled glycemia EAG of 146 and POC a1c of 6.2%.  She is not on medications for diabetes. ) An ACE inhibitor/angiotensin II receptor blocker is being taken. She does not see a podiatrist.Eye exam is current.  Hyperlipidemia This is a chronic problem. The current episode started more than 1 year ago. The problem is uncontrolled. Recent lipid tests were reviewed and are variable. Exacerbating diseases include diabetes. Factors aggravating her hyperlipidemia include fatty foods. Pertinent negatives include no chest pain, myalgias or shortness of breath. Current antihyperlipidemic treatment includes statins. Risk factors for coronary artery disease include dyslipidemia, diabetes mellitus, hypertension, a sedentary lifestyle and post-menopausal.  Hypertension This is a chronic problem. The current episode started more than 1 year ago. The problem has been resolved since onset. The problem is controlled. Pertinent negatives include no chest pain, headaches, palpitations or shortness of breath. Risk factors for coronary artery disease include dyslipidemia, diabetes mellitus, family history, sedentary lifestyle and post-menopausal state. Past treatments include diuretics and angiotensin blockers. The current treatment provides moderate improvement. There are no compliance problems.    Review of systems  Constitutional: + Minimally fluctuating body weight,  current Body mass index is 27.96 kg/m. , no fatigue, no subjective hyperthermia, no subjective hypothermia  Respiratory: no cough, no shortness of breath Gastrointestinal: no  nausea/vomiting, + diarrhea   Objective:    BP (!) 148/68   Pulse 80   Ht _0  (1.651 m)   Wt 168 lb (76.2 kg)   LMP 09/03/2013   BMI 27.96 kg/m   Wt Readings from Last 3 Encounters:  08/19/22 168 lb (76.2 kg)  05/24/22 164 lb (74.4 kg)  04/26/22 165 lb (74.8 kg)     BP Readings from Last 3 Encounters:  08/19/22 (!) 148/68  05/24/22 137/75  04/26/22 (!) 142/70     Physical Exam- Limited  Constitutional:  Body mass index is 27.96  kg/m. , not in acute distress, normal state of mind     CMP     Component Value Date/Time   NA 139 07/26/2022 1142   K 4.2 07/26/2022 1142   CL 98 07/26/2022 1142   CO2 24 07/26/2022 1142   GLUCOSE 148 (H) 07/26/2022 1142   GLUCOSE 167 (H) 02/07/2022 1340   BUN 9 07/26/2022 1142   CREATININE 0.84 07/26/2022 1142   CREATININE 1.03 12/23/2019 1036   CALCIUM 10.3 07/26/2022 1142   PROT 7.9 07/26/2022 1144   ALBUMIN 4.9 07/26/2022 1144   AST 48 (H) 07/26/2022 1144   ALT 39 (H) 07/26/2022 1144   ALKPHOS 94 07/26/2022 1144   BILITOT 0.9 07/26/2022 1144   GFRNONAA >60 02/07/2022 1340   GFRNONAA 60 12/23/2019 1036   GFRAA 69 12/23/2019 1036     Diabetic Labs (most recent): Lab Results  Component Value Date   HGBA1C 6.2 08/19/2022   HGBA1C 5.9 12/09/2021   HGBA1C 5.9 09/06/2021   MICROALBUR 80 08/31/2020   MICROALBUR 2.8 12/23/2019     Lipid Panel ( most recent) Lipid Panel     Component Value Date/Time   CHOL 201 (H) 07/26/2022 1142   TRIG 197 (H) 07/26/2022 1142   HDL 52 07/26/2022 1142   CHOLHDL 3.9 07/26/2022 1142   CHOLHDL 3.9 09/10/2014 0805   VLDL 25 09/10/2014 0805   LDLCALC 115 (H) 07/26/2022 1142   LABVLDL 34 07/26/2022 1142     Assessment & Plan:   1) Type 2 diabetes mellitus with hyperglycemia, without long-term current use of insulin ([place])  - Ali Lowe Maulding has currently uncontrolled symptomatic type 2 DM since  61 years of age.  She presents with controlled glycemia EAG of 146 and POC a1c of  6.2%.  She is not on medications for diabetes.    Recent labs reviewed.  - I had a long discussion with her about the progressive nature of diabetes and the pathology behind its complications. -She does not report any gross complications from her diabetes, however, she remains at a high risk for more acute and chronic complications which include CAD, CVA, CKD, retinopathy, and neuropathy. These are all discussed in detail with her.  - Nutritional counseling repeated at each appointment due to patients tendency to fall back in to old habits.  - she acknowledges that there is a room for improvement in her food and drink choices. - Suggestion is made for her to avoid simple carbohydrates  from her diet including Cakes, Sweet Desserts, Ice Cream, Soda (diet and regular), Sweet Tea, Candies, Chips, Cookies, Store Bought Juices, Alcohol , Artificial Sweeteners,  Coffee Creamer, and "Sugar-free" Products, Lemonade. This will help patient to have more stable blood glucose profile and potentially avoid unintended weight gain.  The following Lifestyle Medicine recommendations according to [place]  [facility]) were discussed and and offered to patient and she  agrees to start the journey:  A. Whole Foods, Plant-Based Nutrition comprising of fruits and vegetables, plant-based proteins, whole-grain carbohydrates was discussed in detail with the patient.   A list for source of those nutrients were also provided to the patient.  Patient will use only water or unsweetened tea for hydration. B.  The need to stay away from risky substances including alcohol, smoking; obtaining 7 to 9 hours of restorative sleep, at least 150 minutes of moderate intensity exercise weekly, the importance of healthy social connections,  and stress management techniques were discussed. C.  A full  color page of  Calorie density of various food groups per pound showing examples of each food groups was provided to  the patient.  - she has been scheduled with Jearld Fenton, RDN, CDE for diabetes education.  - I have approached her with the following individualized plan to manage  her diabetes and patient agrees:   -In light of her presentation with controlled glycemic profile with point-of-care A1c of 6.2%, she is advised to stay off of medications for now.  She did not tolerate metformin.   -If she requires medication duration, she will be considered for GLP-1 receptor agonist next visit.  She  is advised to monitor blood glucose at least once a day before breakfast and report if blood glucose profile gets out of control greater than 200 mg/day.   - she will be considered for incretin or glipizide therapy as appropriate next visit.  - Specific targets for  A1c;  LDL, HDL,  and Triglycerides were discussed with the patient.  2) Blood Pressure /Hypertension:  --Her blood pressure is not controlled to target.  she is advised to continue her current meds including HCTZ 25 mg p.o. daily with breakfast .  She is also on Losartan 50 mg p.o. daily.  3) Lipids/Hyperlipidemia:    Review of her previsit lipid panel showed increased LDL at 115 increasing from 73.  She is on atorvastatin 20 mg p.o. nightly, advised for consistency.  She would also benefit from whole food plant-based diet discussed above. She does have elevated liver enzymes which could be a sign of fatty liver.  This is discussed with her the potential risk for liver failure.  If her ferritin remains high, she will need a consult with hematology.   4)  Weight/Diet:   Her Body mass index is 27.96 kg/m.  -   she is a candidate for modest weight loss. I discussed with her the fact that loss of 5 - 10% of her  current body weight will have the most impact on her diabetes management.  Exercise, and detailed carbohydrates information provided  -  detailed on discharge instructions.  5) Chronic Care/Health Maintenance: -she  is on ARB and statin  medications and  is encouraged to initiate and continue to follow up with Ophthalmology, Dentist,  Podiatrist at least yearly or according to recommendations, and advised to stay away from smoking. I have recommended yearly flu vaccine and pneumonia vaccine at least every 5 years; moderate intensity exercise for up to 150 minutes weekly; and  sleep for at least 7 hours a day.   - she is  advised to maintain close follow up with Alvira Monday, FNP for primary care needs, as well as her other providers for optimal and coordinated care.    I spent 28 minutes in the care of the patient today including review of labs from Thyroid Function, CMP, and other relevant labs ; imaging/biopsy records (current and previous including abstractions from other facilities); face-to-face time discussing  her lab results and symptoms, medications doses, her options of short and long term treatment based on the latest standards of care / guidelines;   and documenting the encounter.  Laura Scott  participated in the discussions, expressed understanding, and voiced agreement with the above plans.  All questions were answered to her satisfaction. she is encouraged to contact clinic should she have any questions or concerns prior to her return visit.    Follow up plan: - Return in about 6 months (around 02/18/2023) for  Fasting Labs  in AM B4 8, A1c -NV.  Rayetta Pigg, [facility] [address] [place], [place] [address] Phone: [phone] Fax: [phone]  08/19/2022, 1:13 PM

## 2022-08-19 NOTE — Patient Instructions (Signed)

## 2022-08-26 ENCOUNTER — Encounter: Payer: Self-pay | Admitting: Family Medicine

## 2022-08-26 ENCOUNTER — Ambulatory Visit (INDEPENDENT_AMBULATORY_CARE_PROVIDER_SITE_OTHER): Payer: No Typology Code available for payment source | Admitting: Family Medicine

## 2022-08-26 VITALS — BP 134/72 | HR 95 | Ht 66.0 in | Wt 167.0 lb

## 2022-08-26 DIAGNOSIS — E038 Other specified hypothyroidism: Secondary | ICD-10-CM

## 2022-08-26 DIAGNOSIS — M545 Low back pain, unspecified: Secondary | ICD-10-CM | POA: Diagnosis not present

## 2022-08-26 DIAGNOSIS — M62838 Other muscle spasm: Secondary | ICD-10-CM

## 2022-08-26 DIAGNOSIS — E559 Vitamin D deficiency, unspecified: Secondary | ICD-10-CM | POA: Diagnosis not present

## 2022-08-26 DIAGNOSIS — E7849 Other hyperlipidemia: Secondary | ICD-10-CM

## 2022-08-26 DIAGNOSIS — I10 Essential (primary) hypertension: Secondary | ICD-10-CM | POA: Diagnosis not present

## 2022-08-26 DIAGNOSIS — F32A Depression, unspecified: Secondary | ICD-10-CM

## 2022-08-26 DIAGNOSIS — E782 Mixed hyperlipidemia: Secondary | ICD-10-CM

## 2022-08-26 DIAGNOSIS — F419 Anxiety disorder, unspecified: Secondary | ICD-10-CM

## 2022-08-26 DIAGNOSIS — G8929 Other chronic pain: Secondary | ICD-10-CM | POA: Diagnosis not present

## 2022-08-26 DIAGNOSIS — E1165 Type 2 diabetes mellitus with hyperglycemia: Secondary | ICD-10-CM | POA: Diagnosis not present

## 2022-08-26 MED ORDER — TIZANIDINE HCL 4 MG PO TABS: 4.0000 mg | ORAL_TABLET | Freq: Four times a day (QID) | ORAL | 0 refills | Status: DC | PRN

## 2022-08-26 MED ORDER — VITAMIN D 125 MCG (5000 UT) PO CAPS: 5000.0000 [IU] | ORAL_CAPSULE | Freq: Every day | ORAL | 2 refills | Status: AC

## 2022-08-26 MED ORDER — HYDROCHLOROTHIAZIDE 25 MG PO TABS: ORAL_TABLET | ORAL | 1 refills | Status: DC

## 2022-08-26 MED ORDER — FISH OIL OMEGA-3 1000 MG PO CAPS: 1.0000 g | ORAL_CAPSULE | Freq: Every day | ORAL | 1 refills | Status: AC

## 2022-08-26 MED ORDER — ATORVASTATIN CALCIUM 20 MG PO TABS: 20.0000 mg | ORAL_TABLET | Freq: Every day | ORAL | 0 refills | Status: DC

## 2022-08-26 MED ORDER — OMEPRAZOLE 40 MG PO CPDR: 40.0000 mg | DELAYED_RELEASE_CAPSULE | Freq: Every day | ORAL | 1 refills | Status: DC

## 2022-08-26 MED ORDER — ESCITALOPRAM OXALATE 20 MG PO TABS: 20.0000 mg | ORAL_TABLET | Freq: Every day | ORAL | 1 refills | Status: DC

## 2022-08-26 MED ORDER — LOSARTAN POTASSIUM 50 MG PO TABS: 50.0000 mg | ORAL_TABLET | Freq: Every day | ORAL | 1 refills | Status: DC

## 2022-08-26 NOTE — Patient Instructions (Addendum)
   I appreciate the opportunity to provide care to you today!    Follow up:  3 months                                Please schedule medicare annual wellness                           Please go to your pharmacy to get tdap and shingles vaccines.   Please continue to a heart-healthy diet and increase your physical activities. Try to exercise for 2mns at least three times a week.      It was a pleasure to see you and I look forward to continuing to work together on your health and well-being. Please do not hesitate to call the office if you need care or have questions about your care.   Have a wonderful day and week. With Gratitude, GAlvira MondayMSN, FNP-BC

## 2022-08-26 NOTE — Progress Notes (Unsigned)
Established Patient Office Visit  Subjective:  Patient ID: Laura Scott, female    DOB: 04-Jul-1961  Age: 62 y.o. MRN: [id number]  CC:  Chief Complaint  Patient presents with   Follow-up    3 month f/u.     HPI Laura Scott is a 62 y.o. female with past medical history of essential hypertension, type 2 diabetes, anxiety and insomnia presents for f/u of  chronic medical conditions. For the details of today's visit, please refer to the assessment and plan.     Past Medical History:  Diagnosis Date   Allergic rhinitis    Anxiety    Chronic pain    Depression    Diabetes mellitus without complication (HCC)    GERD (gastroesophageal reflux disease)    Hyperlipidemia    Hypertension    IBS (irritable bowel syndrome)    PMS (premenstrual syndrome)    Reflux     Past Surgical History:  Procedure Laterality Date   COLONOSCOPY  08/29/2011   [name]: normal. high risk screening tcs in 5 years.    COLONOSCOPY WITH PROPOFOL N/A 10/20/2016   Procedure: COLONOSCOPY WITH PROPOFOL;  Surgeon: Daneil Dolin, MD;  [facility];  Service: Endoscopy;  Laterality: N/A;  10:45am   COLONOSCOPY WITH PROPOFOL N/A 02/09/2022   Procedure: COLONOSCOPY WITH PROPOFOL;  Surgeon: Daneil Dolin, MD;  [facility];  Service: Endoscopy;  Laterality: N/A;  2:30 / ASA 2   ESOPHAGOGASTRODUODENOSCOPY (EGD) WITH PROPOFOL N/A 10/20/2016   Procedure: ESOPHAGOGASTRODUODENOSCOPY (EGD) WITH PROPOFOL;  Surgeon: Daneil Dolin, MD;  [facility];  Service: Endoscopy;  Laterality: N/A;   LUMBAR [place] N/A 10/20/2016   Procedure: Venia Minks DILATION;  Surgeon: Daneil Dolin, MD;  [facility];  Service: Endoscopy;  Laterality: N/A;   POLYPECTOMY  10/20/2016   Procedure: POLYPECTOMY;  Surgeon: Daneil Dolin, MD;  [facility];  Service: Endoscopy;;  cecal and sigmoid polypectomies    Family History  Problem Relation Age of Onset    Hypertension Mother    Colon polyps Mother    Hypertension Father    Diabetes Father    Hypertension Sister    Hyperlipidemia Sister    Colon polyps Brother    Arthritis Brother    Diabetes Maternal Aunt    Hypertension Maternal Aunt    Rheum arthritis Maternal Grandmother    Prostate cancer Maternal Grandfather    Cancer Paternal Grandfather        not sure of type of cancer   Breast cancer Neg Hx    Cervical cancer Neg Hx     Social History   Socioeconomic History   Marital status: Divorced    Spouse name: Not on file   Number of children: Not on file   Years of education: Not on file   Highest education level: Not on file  Occupational History   Not on file  Tobacco Use   Smoking status: Never    Passive exposure: Current   Smokeless tobacco: Never  Vaping Use   Vaping Use: Never used  Substance and Sexual Activity   Alcohol use: Yes    Comment: occ   Drug use: No   Sexual activity: Yes    Birth control/protection: None, Post-menopausal  Other Topics Concern   Not on file  Social History Narrative   Lives with her brother. Pt is on disability from her lumbar fusion.  Social Determinants of Health   Financial Resource Strain: Not on file  Food Insecurity: Not on file  Transportation Needs: Not on file  Physical Activity: Not on file  Stress: Not on file  Social Connections: Not on file  Intimate Partner Violence: Not on file    Outpatient Medications Prior to Visit  Medication Sig Dispense Refill   albuterol (VENTOLIN HFA) 108 (90 Base) MCG/ACT inhaler Inhale 1-2 puffs into the lungs every 6 (six) hours as needed for wheezing or shortness of breath. 18 g 0   budesonide-formoterol (SYMBICORT) 160-4.5 MCG/ACT inhaler Inhale 2 puffs into the lungs 2 (two) times daily. (Patient taking differently: Inhale 2 puffs into the lungs daily as needed (Shortness of breath).) 1 Inhaler 5   busPIRone (BUSPAR) 5 MG tablet TAKE (1) TABLET BY MOUTH (3) TIMES DAILY AS  NEEDED FOR ANXIETY 90 tablet 5   Cyanocobalamin (VITAMIN B-12) 5000 MCG SUBL Place 5,000 mcg under the tongue daily.     diclofenac Sodium (VOLTAREN) 1 % GEL Apply 2-4 grams to affected joint 4 times daily as needed. 400 g 2   fluticasone (FLONASE) 50 MCG/ACT nasal spray Place 2 sprays into both nostrils daily. (Patient taking differently: Place 2 sprays into both nostrils daily as needed for allergies.) 48 g 5   hydrOXYzine (VISTARIL) 25 MG capsule TAKE 1 CAPSULE EVERY 8 HOURS AS NEEDED. 30 capsule 0   levocetirizine (XYZAL) 5 MG tablet Take 1 tablet (5 mg total) by mouth every evening. 90 tablet 0   Probiotic Product (PROBIOTIC DAILY PO) Take by mouth.     atorvastatin (LIPITOR) 20 MG tablet TAKE 1 TABLET AT BEDTIME. 90 tablet 0   Cholecalciferol (VITAMIN D) 125 MCG (5000 UT) CAPS Take 5,000 Units by mouth daily with breakfast. 30 capsule 2   escitalopram (LEXAPRO) 20 MG tablet TAKE 1 TABLET BY MOUTH DAILY. NEEDS OFFICE VISIT. 90 tablet 0   hydrochlorothiazide (HYDRODIURIL) 25 MG tablet TAKE 1 TABLET BY MOUTH ONCE DAILY AS NEEDED (Patient taking differently: Take 25 mg by mouth daily as needed (Fluid).) 90 tablet 1   losartan (COZAAR) 100 MG tablet Take 0.5 tablets (50 mg total) by mouth daily. 90 tablet 0   Omega-3 Fatty Acids (FISH OIL OMEGA-3 PO) Take 1 capsule by mouth daily.     omeprazole (PRILOSEC) 40 MG capsule Take 1 capsule (40 mg total) by mouth daily. 90 capsule 1   polyethylene glycol-electrolytes (TRILYTE) 420 g solution Take 4,000 mLs by mouth as directed. (Patient not taking: Reported on 08/26/2022) 4000 mL 0   No facility-administered medications prior to visit.    Allergies  Allergen Reactions   Sulfa Antibiotics Hives, Diarrhea and Itching   Other     SURGICAL TAPE Reaction: rash    Penicillins Hives, Itching and Rash    Pt states that she is able to take PCN  Has patient had a PCN reaction causing immediate rash, facial/tongue/throat swelling, SOB or lightheadedness  with hypotension: Yes Has patient had a PCN reaction causing severe rash involving mucus membranes or skin necrosis: No Has patient had a PCN reaction that required hospitalization: No Has patient had a PCN reaction occurring within the last 10 years: Yes If all of the above answers are "NO", then may proceed with Cephalosporin use.    ROS Review of Systems  Constitutional:  Negative for chills and fever.  Eyes:  Negative for visual disturbance.  Respiratory:  Negative for chest tightness and shortness of breath.   Musculoskeletal:  Muscle spasm in her lower back  Neurological:  Negative for dizziness and headaches.      Objective:    Physical Exam HENT:     Head: Normocephalic.     Mouth/Throat:     Mouth: Mucous membranes are moist.  Cardiovascular:     Rate and Rhythm: Normal rate.     Heart sounds: Normal heart sounds.  Pulmonary:     Effort: Pulmonary effort is normal.     Breath sounds: Normal breath sounds.  Neurological:     Mental Status: She is alert.     BP 134/72 (BP Location: Left Arm)   Pulse 95   Ht '5\' 6"'$  (1.676 m)   Wt 167 lb 0.6 oz (75.8 kg)   LMP 09/03/2013   SpO2 98%   BMI 26.96 kg/m  Wt Readings from Last 3 Encounters:  08/26/22 167 lb 0.6 oz (75.8 kg)  08/19/22 168 lb (76.2 kg)  05/24/22 164 lb (74.4 kg)    Lab Results  Component Value Date   TSH 2.220 08/26/2022   Lab Results  Component Value Date   WBC 7.8 08/26/2022   HGB 13.3 08/26/2022   HCT 37.7 08/26/2022   MCV 91 08/26/2022   PLT 310 08/26/2022   Lab Results  Component Value Date   NA 139 07/26/2022   K 4.2 07/26/2022   CO2 24 07/26/2022   GLUCOSE 148 (H) 07/26/2022   BUN 9 07/26/2022   CREATININE 0.84 07/26/2022   BILITOT 0.9 07/26/2022   ALKPHOS 94 07/26/2022   AST 48 (H) 07/26/2022   ALT 39 (H) 07/26/2022   PROT 7.9 07/26/2022   ALBUMIN 4.9 07/26/2022   CALCIUM 10.3 07/26/2022   ANIONGAP 13 02/07/2022   EGFR 79 07/26/2022   Lab Results  Component  Value Date   CHOL 201 (H) 07/26/2022   Lab Results  Component Value Date   HDL 52 07/26/2022   Lab Results  Component Value Date   LDLCALC 115 (H) 07/26/2022   Lab Results  Component Value Date   TRIG 197 (H) 07/26/2022   Lab Results  Component Value Date   CHOLHDL 3.9 07/26/2022   Lab Results  Component Value Date   HGBA1C 6.2 08/19/2022      Assessment & Plan:  Essential hypertension, benign Assessment & Plan: Controlled She takes losartan 50 mg daily She denies headaches, dizziness, blurred vision, chest pain, palpitation, and shortness of breath Encouraged to continue treatment regimen BP Readings from Last 3 Encounters:  08/26/22 134/72  08/19/22 (!) 148/68  05/24/22 137/75      Orders: -     hydroCHLOROthiazide; TAKE 1 TABLET BY MOUTH ONCE DAILY AS NEEDED  Dispense: 90 tablet; Refill: 1 -     Losartan Potassium; Take 1 tablet (50 mg total) by mouth daily.  Dispense: 90 tablet; Refill: 1 -     Omeprazole; Take 1 capsule (40 mg total) by mouth daily.  Dispense: 90 capsule; Refill: 1  Chronic midline low back pain without sciatica Assessment & Plan: She complains of muscle spasm in her lower back Will treat with Zanaflex 4 mg to take every 6 hours Encouraged use of Tylenol as needed for back pain   Muscle spasm -     tiZANidine HCl; Take 1 tablet (4 mg total) by mouth every 6 (six) hours as needed for muscle spasms.  Dispense: 30 tablet; Refill: 0  Anxiety and depression Assessment & Plan: She takes Lexapro 20 mg daily She denies suicidal thoughts and  ideation Encouraged to continue treatment regimen   Mixed hyperlipidemia Assessment & Plan: She takes Lipitor 20 mg daily and omega-3 fatty acid 1000 mg capsule daily She denies muscle aches and pain Will assess lipid panel today Lab Results  Component Value Date   CHOL 201 (H) 07/26/2022   HDL 52 07/26/2022   LDLCALC 115 (H) 07/26/2022   TRIG 197 (H) 07/26/2022   CHOLHDL 3.9 07/26/2022      Orders: -     Atorvastatin Calcium; Take 1 tablet (20 mg total) by mouth at bedtime.  Dispense: 90 tablet; Refill: 0  Type 2 diabetes mellitus with hyperglycemia, without long-term current use of insulin (HCC) -     Atorvastatin Calcium; Take 1 tablet (20 mg total) by mouth at bedtime.  Dispense: 90 tablet; Refill: 0 -     CBC with Differential/Platelet -     Microalbumin / creatinine urine ratio  Other specified hypothyroidism -     TSH + free T4  Vitamin D deficiency -     Vitamin D; Take 5,000 Units by mouth daily with breakfast.  Dispense: 30 capsule; Refill: 2 -     VITAMIN D 25 Hydroxy (Vit-D Deficiency, Fractures)  Anxiety -     Escitalopram Oxalate; Take 1 tablet (20 mg total) by mouth daily.  Dispense: 90 tablet; Refill: 1  Other hyperlipidemia -     Fish Oil Omega-3; Take 1 g by mouth daily.  Dispense: 90 capsule; Refill: 1    Follow-up: Return in about 3 months (around 11/25/2022).   Alvira Monday, FNP

## 2022-08-27 NOTE — Assessment & Plan Note (Signed)
She takes Lipitor 20 mg daily and omega-3 fatty acid 1000 mg capsule daily She denies muscle aches and pain Will assess lipid panel today Lab Results  Component Value Date   CHOL 201 (H) 07/26/2022   HDL 52 07/26/2022   LDLCALC 115 (H) 07/26/2022   TRIG 197 (H) 07/26/2022   CHOLHDL 3.9 07/26/2022

## 2022-08-27 NOTE — Assessment & Plan Note (Signed)
Controlled She takes losartan 50 mg daily She denies headaches, dizziness, blurred vision, chest pain, palpitation, and shortness of breath Encouraged to continue treatment regimen BP Readings from Last 3 Encounters:  08/26/22 134/72  08/19/22 (!) 148/68  05/24/22 137/75

## 2022-08-27 NOTE — Assessment & Plan Note (Signed)
She complains of muscle spasm in her lower back Will treat with Zanaflex 4 mg to take every 6 hours Encouraged use of Tylenol as needed for back pain

## 2022-08-27 NOTE — Assessment & Plan Note (Signed)
She takes Lexapro 20 mg daily She denies suicidal thoughts and ideation Encouraged to continue treatment regimen

## 2022-08-27 NOTE — Assessment & Plan Note (Signed)
She takes omeprazole 40 mg daily She reports compliance with treatment regimen Encouraged to continue therapy

## 2022-08-29 LAB — CBC WITH DIFFERENTIAL/PLATELET
Basophils Absolute: 0 10*3/uL (ref 0.0–0.2)
Basos: 1 %
EOS (ABSOLUTE): 0 10*3/uL (ref 0.0–0.4)
Eos: 1 %
Hematocrit: 37.7 % (ref 34.0–46.6)
Hemoglobin: 13.3 g/dL (ref 11.1–15.9)
Immature Grans (Abs): 0 10*3/uL (ref 0.0–0.1)
Immature Granulocytes: 0 %
Lymphocytes Absolute: 1.8 10*3/uL (ref 0.7–3.1)
Lymphs: 22 %
MCH: 32.2 pg (ref 26.6–33.0)
MCHC: 35.3 g/dL (ref 31.5–35.7)
MCV: 91 fL (ref 79–97)
Monocytes Absolute: 0.5 10*3/uL (ref 0.1–0.9)
Monocytes: 6 %
Neutrophils Absolute: 5.5 10*3/uL (ref 1.4–7.0)
Neutrophils: 70 %
Platelets: 310 10*3/uL (ref 150–450)
RBC: 4.13 x10E6/uL (ref 3.77–5.28)
RDW: 11.2 % — ABNORMAL LOW (ref 11.7–15.4)
WBC: 7.8 10*3/uL (ref 3.4–10.8)

## 2022-08-29 LAB — MICROALBUMIN / CREATININE URINE RATIO
Creatinine, Urine: 96.4 mg/dL
Microalb/Creat Ratio: 5 mg/g creat (ref 0–29)
Microalbumin, Urine: 4.9 ug/mL

## 2022-08-29 LAB — TSH+FREE T4
Free T4: 1.27 ng/dL (ref 0.82–1.77)
TSH: 2.22 u[IU]/mL (ref 0.450–4.500)

## 2022-08-29 LAB — VITAMIN D 25 HYDROXY (VIT D DEFICIENCY, FRACTURES): Vit D, 25-Hydroxy: 38.5 ng/mL (ref 30.0–100.0)

## 2022-08-29 NOTE — Progress Notes (Signed)
Please inform the patient that her labs are stable

## 2022-09-08 ENCOUNTER — Other Ambulatory Visit: Payer: Self-pay | Admitting: Gastroenterology

## 2022-09-08 DIAGNOSIS — R779 Abnormality of plasma protein, unspecified: Secondary | ICD-10-CM

## 2022-09-08 DIAGNOSIS — R768 Other specified abnormal immunological findings in serum: Secondary | ICD-10-CM

## 2022-09-12 ENCOUNTER — Ambulatory Visit (INDEPENDENT_AMBULATORY_CARE_PROVIDER_SITE_OTHER): Payer: No Typology Code available for payment source | Admitting: Internal Medicine

## 2022-09-12 ENCOUNTER — Encounter: Payer: Self-pay | Admitting: Internal Medicine

## 2022-09-12 VITALS — BP 164/80 | HR 101 | Resp 16 | Ht 65.5 in | Wt 169.1 lb

## 2022-09-12 DIAGNOSIS — Z0000 Encounter for general adult medical examination without abnormal findings: Secondary | ICD-10-CM | POA: Diagnosis not present

## 2022-09-12 DIAGNOSIS — R768 Other specified abnormal immunological findings in serum: Secondary | ICD-10-CM | POA: Diagnosis not present

## 2022-09-12 DIAGNOSIS — R779 Abnormality of plasma protein, unspecified: Secondary | ICD-10-CM | POA: Diagnosis not present

## 2022-09-12 NOTE — Progress Notes (Signed)
Subjective:   Laura Scott is a 62 y.o. female who presents for an Initial Medicare Annual Wellness Visit.  Review of Systems    Review of Systems  All other systems reviewed and are negative.  Objective:    Today's Vitals   09/12/22 1310 09/12/22 1314  BP: (!) 164/80   Pulse: (!) 101   Resp: 16   SpO2: 95%   Weight: 169 lb 1.9 oz (76.7 kg)   Height: 5' 5.5" (1.664 m)   PainSc:  0-No pain   Body mass index is 27.72 kg/m.     09/12/2022    1:19 PM 02/09/2022    9:24 AM 03/15/2017   12:26 PM 10/20/2016    8:58 AM 10/14/2016   12:51 PM 01/08/2016    4:48 PM 12/31/2015    2:35 PM  Advanced Directives  Does Patient Have a Medical Advance Directive? No No No No No No No  Would patient like information on creating a medical advance directive?  No - Patient declined No - Patient declined No - Patient declined No - Patient declined Yes - Educational materials given Yes - Educational materials given    Current Medications (verified) Outpatient Encounter Medications as of 09/12/2022  Medication Sig   albuterol (VENTOLIN HFA) 108 (90 Base) MCG/ACT inhaler Inhale 1-2 puffs into the lungs every 6 (six) hours as needed for wheezing or shortness of breath.   atorvastatin (LIPITOR) 20 MG tablet Take 1 tablet (20 mg total) by mouth at bedtime.   budesonide-formoterol (SYMBICORT) 160-4.5 MCG/ACT inhaler Inhale 2 puffs into the lungs 2 (two) times daily. (Patient taking differently: Inhale 2 puffs into the lungs daily as needed (Shortness of breath).)   busPIRone (BUSPAR) 5 MG tablet TAKE (1) TABLET BY MOUTH (3) TIMES DAILY AS NEEDED FOR ANXIETY   Cholecalciferol (VITAMIN D) 125 MCG (5000 UT) CAPS Take 5,000 Units by mouth daily with breakfast.   Cyanocobalamin (VITAMIN B-12) 5000 MCG SUBL Place 5,000 mcg under the tongue daily.   diclofenac Sodium (VOLTAREN) 1 % GEL Apply 2-4 grams to affected joint 4 times daily as needed.   escitalopram (LEXAPRO) 20 MG tablet Take 1 tablet (20 mg total)  by mouth daily.   fluticasone (FLONASE) 50 MCG/ACT nasal spray Place 2 sprays into both nostrils daily. (Patient taking differently: Place 2 sprays into both nostrils daily as needed for allergies.)   hydrochlorothiazide (HYDRODIURIL) 25 MG tablet TAKE 1 TABLET BY MOUTH ONCE DAILY AS NEEDED   hydrOXYzine (VISTARIL) 25 MG capsule TAKE 1 CAPSULE EVERY 8 HOURS AS NEEDED.   levocetirizine (XYZAL) 5 MG tablet Take 1 tablet (5 mg total) by mouth every evening.   losartan (COZAAR) 50 MG tablet Take 1 tablet (50 mg total) by mouth daily.   Omega-3 Fatty Acids (FISH OIL OMEGA-3) 1000 MG CAPS Take 1 g by mouth daily.   omeprazole (PRILOSEC) 40 MG capsule Take 1 capsule (40 mg total) by mouth daily.   polyethylene glycol-electrolytes (TRILYTE) 420 g solution Take 4,000 mLs by mouth as directed.   Probiotic Product (PROBIOTIC DAILY PO) Take by mouth.   tiZANidine (ZANAFLEX) 4 MG tablet Take 1 tablet (4 mg total) by mouth every 6 (six) hours as needed for muscle spasms.   No facility-administered encounter medications on file as of 09/12/2022.    Allergies (verified) Sulfa antibiotics, Other, and Penicillins   History: Past Medical History:  Diagnosis Date   Allergic rhinitis    Anxiety    Chronic pain  Depression    Diabetes mellitus without complication (HCC)    GERD (gastroesophageal reflux disease)    Hyperlipidemia    Hypertension    IBS (irritable bowel syndrome)    PMS (premenstrual syndrome)    Reflux    Past Surgical History:  Procedure Laterality Date   COLONOSCOPY  08/29/2011   [name]: normal. high risk screening tcs in 5 years.    COLONOSCOPY WITH PROPOFOL N/A 10/20/2016   Procedure: COLONOSCOPY WITH PROPOFOL;  Surgeon: Daneil Dolin, MD;  [facility];  Service: Endoscopy;  Laterality: N/A;  10:45am   COLONOSCOPY WITH PROPOFOL N/A 02/09/2022   Procedure: COLONOSCOPY WITH PROPOFOL;  Surgeon: Daneil Dolin, MD;  [facility];  Service: Endoscopy;  Laterality:  N/A;  2:30 / ASA 2   ESOPHAGOGASTRODUODENOSCOPY (EGD) WITH PROPOFOL N/A 10/20/2016   Procedure: ESOPHAGOGASTRODUODENOSCOPY (EGD) WITH PROPOFOL;  Surgeon: Daneil Dolin, MD;  [facility];  Service: Endoscopy;  Laterality: N/A;   LUMBAR [place] N/A 10/20/2016   Procedure: Venia Minks DILATION;  Surgeon: Daneil Dolin, MD;  [facility];  Service: Endoscopy;  Laterality: N/A;   POLYPECTOMY  10/20/2016   Procedure: POLYPECTOMY;  Surgeon: Daneil Dolin, MD;  [facility];  Service: Endoscopy;;  cecal and sigmoid polypectomies   Family History  Problem Relation Age of Onset   Hypertension Mother    Colon polyps Mother    Hypertension Father    Diabetes Father    Hypertension Sister    Hyperlipidemia Sister    Colon polyps Brother    Arthritis Brother    Diabetes Maternal Aunt    Hypertension Maternal Aunt    Rheum arthritis Maternal Grandmother    Prostate cancer Maternal Grandfather    Cancer Paternal Grandfather        not sure of type of cancer   Breast cancer Neg Hx    Cervical cancer Neg Hx    Social History   Socioeconomic History   Marital status: Divorced    Spouse name: Not on file   Number of children: Not on file   Years of education: Not on file   Highest education level: Not on file  Occupational History   Not on file  Tobacco Use   Smoking status: Never    Passive exposure: Current   Smokeless tobacco: Never  Vaping Use   Vaping Use: Never used  Substance and Sexual Activity   Alcohol use: Yes    Comment: occ   Drug use: No   Sexual activity: Yes    Birth control/protection: None, Post-menopausal  Other Topics Concern   Not on file  Social History Narrative   Lives with her brother. Pt is on disability from her lumbar fusion.    Social Determinants of Health   Financial Resource Strain: Not on file  Food Insecurity: Not on file  Transportation Needs: Not on file  Physical Activity: Not on file   Stress: Not on file  Social Connections: Not on file    Tobacco Counseling Counseling given: Not Answered   Clinical Intake:  Pre-visit preparation completed: Yes  Pain : No/denies pain Pain Score: 0-No pain     Nutritional Status: BMI 25 -29 Overweight  How often do you need to have someone help you when you read instructions, pamphlets, or other written materials from your doctor or pharmacy?: 1 - Never What is the last grade level you completed in  school?: 1 year of college  Diabetic? No   Activities of Daily Living    09/12/2022    1:22 PM  In your present state of health, do you have any difficulty performing the following activities:  Hearing? 0  Vision? 0  Difficulty concentrating or making decisions? 0  Walking or climbing stairs? 0  Dressing or bathing? 0  Doing errands, shopping? 0    Patient Care Team: Alvira Monday, FNP as PCP - General (Family Medicine)  Indicate any recent Medical Services you may have received from other than [facility] providers in the past year (date may be approximate).     Assessment:   This is a routine wellness examination for Sahaana.  Hearing/Vision screen No results found.  Dietary issues and exercise activities discussed:     Goals Addressed   None    Depression Screen    09/12/2022    1:22 PM 08/26/2022    1:16 PM 05/24/2022    1:50 PM 04/26/2022    2:10 PM 02/28/2022   10:43 AM 11/12/2021    4:02 PM 09/14/2021    4:01 PM  PHQ 2/9 Scores  PHQ - 2 Score 0 3 3 0 0 6 3  PHQ- 9 Score  '16 11   14 16    '$ Fall Risk    09/12/2022    1:21 PM 08/26/2022    1:16 PM 05/24/2022    1:49 PM 04/26/2022    2:10 PM 02/28/2022   10:42 AM  Fall Risk   Falls in the past year? 0 0 0 0 0  Number falls in past yr: 0 0 0 0 0  Injury with Fall? 0 0 0 0 0  Risk for fall due to :  No Fall Risks No Fall Risks No Fall Risks No Fall Risks  Follow up  Falls evaluation completed Falls evaluation completed Falls evaluation completed Falls  evaluation completed    FALL RISK PREVENTION PERTAINING TO THE HOME:  Any stairs in or around the home? Yes  If so, are there any without handrails? Yes  Home free of loose throw rugs in walkways, pet beds, electrical cords, etc? Yes  Adequate lighting in your home to reduce risk of falls? Yes   ASSISTIVE DEVICES UTILIZED TO PREVENT FALLS:  Life alert? No  Use of a cane, walker or w/c? No  Grab bars in the bathroom? No  Shower chair or bench in shower? No  Elevated toilet seat or a handicapped toilet? No    Cognitive Function:        09/12/2022    1:22 PM  6CIT Screen  What Year? 0 points  What month? 0 points  What time? 0 points  Count back from 20 0 points  Months in reverse 4 points  Repeat phrase 2 points  Total Score 6 points    Immunizations Immunization History  Administered Date(s) Administered   Influenza,inj,Quad PF,6+ Mos 05/31/2018, 08/07/2021, 11/12/2021   Influenza-Unspecified 05/23/2011   Moderna Covid-19 Vaccine Bivalent Booster 53yr & up 09/07/2021   Moderna Sars-Covid-2 Vaccination 11/02/2019, 12/04/2019, 08/06/2020   Pneumococcal Polysaccharide-23 08/20/2018    TDAP status: Due, Education has been provided regarding the importance of this vaccine. Advised may receive this vaccine at local pharmacy or Health Dept. Aware to provide a copy of the vaccination record if obtained from local pharmacy or Health Dept. Verbalized acceptance and understanding.  Flu Vaccine status: Up to date  Pneumococcal vaccine status: Up to date  Covid-19 vaccine status: Information  provided on how to obtain vaccines.   Qualifies for Shingles Vaccine? Yes   Zostavax completed No   Shingrix Completed?: Yes  Screening Tests Health Maintenance  Topic Date Due   Medicare Annual Wellness (AWV)  Never done   DTaP/Tdap/Td (1 - Tdap) Never done   Zoster Vaccines- Shingrix (1 of 2) Never done   COVID-19 Vaccine (5 - 2023-24 season) 04/22/2022   FOOT EXAM  11/13/2022    OPHTHALMOLOGY EXAM  11/17/2022   HEMOGLOBIN A1C  02/18/2023   Diabetic kidney evaluation - eGFR measurement  07/27/2023   Diabetic kidney evaluation - Urine ACR  08/27/2023   MAMMOGRAM  10/30/2023   PAP SMEAR-Modifier  11/12/2024   COLONOSCOPY (Pts 45-79yr Insurance coverage will need to be confirmed)  02/10/2027   Hepatitis C Screening  Completed   HIV Screening  Completed   HPV VACCINES  Aged Out   INFLUENZA VACCINE  Discontinued    Health Maintenance  Health Maintenance Due  Topic Date Due   Medicare Annual Wellness (AWV)  Never done   DTaP/Tdap/Td (1 - Tdap) Never done   Zoster Vaccines- Shingrix (1 of 2) Never done   COVID-19 Vaccine (5 - 2023-24 season) 04/22/2022    Colorectal cancer screening: Type of screening: Colonoscopy. Completed 02/09/2022. Repeat every 5 years  Mammogram status: Completed 11/12/2021. Repeat every year  Lung Cancer Screening: (Low Dose CT Chest recommended if Age 62-80years, 30 pack-year currently smoking OR have quit w/in 15years.) does not qualify.    Additional Screening:  Hepatitis C Screening: does not qualify; Completed 09/02/2021  Vision Screening: Recommended annual ophthalmology exams for early detection of glaucoma and other disorders of the eye. Is the patient up to date with their annual eye exam?  Yes  Who is the provider or what is the name of the office in which the patient attends annual eye exams? [facility] If pt is not established with a provider, would they like to be referred to a provider to establish care? No .   Dental Screening: Recommended annual dental exams for proper oral hygiene  Community Resource Referral / Chronic Care Management: CRR required this visit?  No   CCM required this visit?  No      Plan:     I have personally reviewed and noted the following in the patient's chart:   Medical and social history Use of alcohol, tobacco or illicit drugs  Current medications and supplements including  opioid prescriptions. Patient is not currently taking opioid prescriptions. Functional ability and status Nutritional status Physical activity Advanced directives List of other physicians Hospitalizations, surgeries, and ER visits in previous 12 months Vitals Screenings to include cognitive, depression, and falls Referrals and appointments  In addition, I have reviewed and discussed with patient certain preventive protocols, quality metrics, and best practice recommendations. A written personalized care plan for preventive services as well as general preventive health recommendations were provided to patient.     JLorene Dy MD   09/12/2022

## 2022-09-12 NOTE — Patient Instructions (Addendum)
  Laura Scott , Thank you for taking time to come for your Medicare Wellness Visit. I appreciate your ongoing commitment to your health goals. Please review the following plan we discussed and let me know if I can assist you in the future.   These are the goals we discussed: Goal of weight loss, aiming for 160 lbs. She will cut out sugar drinks and it healthy food.  Regular physical activity is one of the most important things you can do for your health. Look for ways to reduce time sitting and increase time moving. For example, make it a tradition to walk before or after dinner. I recommend at least 150 minutes of moderate-intensity physical activity a week for adults. You might split that into 30 minutes, 5 days a week.     This is a list of the screening recommended for you and due dates:  Health Maintenance  Topic Date Due   Medicare Annual Wellness Visit  Never done   DTaP/Tdap/Td vaccine (1 - Tdap) Never done   Zoster (Shingles) Vaccine (1 of 2) Never done   COVID-19 Vaccine (5 - 2023-24 season) 04/22/2022   Complete foot exam   11/13/2022   Eye exam for diabetics  11/17/2022   Hemoglobin A1C  02/18/2023   Yearly kidney function blood test for diabetes  07/27/2023   Yearly kidney health urinalysis for diabetes  08/27/2023   Mammogram  10/30/2023   Pap Smear  11/12/2024   Colon Cancer Screening  02/10/2027   Hepatitis C Screening: USPSTF Recommendation to screen - Ages 18-79 yo.  Completed   HIV Screening  Completed   HPV Vaccine  Aged Out   Flu Shot  Discontinued

## 2022-09-16 LAB — PE AND FLC, SERUM
A/G Ratio: 1.2 (ref 0.7–1.7)
Albumin ELP: 4.3 g/dL (ref 2.9–4.4)
Alpha 1: 0.2 g/dL (ref 0.0–0.4)
Alpha 2: 0.8 g/dL (ref 0.4–1.0)
Beta: 1.4 g/dL — ABNORMAL HIGH (ref 0.7–1.3)
Gamma Globulin: 1.2 g/dL (ref 0.4–1.8)
Globulin, Total: 3.7 g/dL (ref 2.2–3.9)
Ig Kappa Free Light Chain: 39.3 mg/L — ABNORMAL HIGH (ref 3.3–19.4)
Ig Lambda Free Light Chain: 26 mg/L (ref 5.7–26.3)
KAPPA/LAMBDA RATIO: 1.51 (ref 0.26–1.65)
Total Protein: 8 g/dL (ref 6.0–8.5)

## 2022-09-16 LAB — IMMUNOFIXATION, SERUM
IgA/Immunoglobulin A, Serum: 619 mg/dL — ABNORMAL HIGH (ref 87–352)
IgG (Immunoglobin G), Serum: 1301 mg/dL (ref 586–1602)
IgM (Immunoglobulin M), Srm: 129 mg/dL (ref 26–217)

## 2022-09-19 ENCOUNTER — Other Ambulatory Visit (INDEPENDENT_AMBULATORY_CARE_PROVIDER_SITE_OTHER): Payer: Self-pay | Admitting: *Deleted

## 2022-09-19 DIAGNOSIS — R7989 Other specified abnormal findings of blood chemistry: Secondary | ICD-10-CM

## 2022-09-26 DIAGNOSIS — H52209 Unspecified astigmatism, unspecified eye: Secondary | ICD-10-CM | POA: Diagnosis not present

## 2022-09-26 DIAGNOSIS — H5213 Myopia, bilateral: Secondary | ICD-10-CM | POA: Diagnosis not present

## 2022-09-26 DIAGNOSIS — H524 Presbyopia: Secondary | ICD-10-CM | POA: Diagnosis not present

## 2022-09-27 ENCOUNTER — Encounter: Payer: Self-pay | Admitting: Internal Medicine

## 2022-09-27 ENCOUNTER — Ambulatory Visit (INDEPENDENT_AMBULATORY_CARE_PROVIDER_SITE_OTHER): Payer: No Typology Code available for payment source | Admitting: Internal Medicine

## 2022-09-27 ENCOUNTER — Other Ambulatory Visit: Payer: Self-pay

## 2022-09-27 VITALS — BP 121/74 | HR 69 | Temp 98.2°F | Ht 65.0 in | Wt 171.6 lb

## 2022-09-27 DIAGNOSIS — R7989 Other specified abnormal findings of blood chemistry: Secondary | ICD-10-CM

## 2022-09-27 DIAGNOSIS — R768 Other specified abnormal immunological findings in serum: Secondary | ICD-10-CM | POA: Diagnosis not present

## 2022-09-27 NOTE — Progress Notes (Signed)
Primary Care Physician:  Alvira Monday, [place] Primary Gastroenterologist:  Dr. Gala Romney  Pre-Procedure History & Physical: HPI:  Laura Scott is a 62 y.o. female here for  follow-up of elevated aminotransferases and ferritin; ferritin repeatedly elevated in the 500 range; transaminases minimally elevated with a  AST values in the 40-50 range ALT values similarly minimally elevated.  Seen over a year ago for diarrhea improved with Bentyl. Colonoscopy back in June of last year for surveillance (history of colonic polyps) negative except for diverticulosis; 7-year follow-up interval recommended. Viral markers negative for chronic viral hepatitis;  autoimmune evaluation negative except IgA  elevated.  Found to have elevated light chains.  Seeing the hematologist next month.  Markers negative for HFE hemochromatosis.  No family history of liver disease.  Patient tells me that she drinks 2 beers daily occasionally; will have a glass of liquor on the weekends; she drinks more when she is out of town -  quite a bit.  When she is out of town with her friends she does not keep up with how much she drinks.  She tells me the last 2 times she had liver enzymes done she recalls being out of town and "partying with increased alcohol consumption prior to lab draw.  Past Medical History:  Diagnosis Date   Allergic rhinitis    Anxiety    Chronic pain    Depression    Diabetes mellitus without complication (HCC)    GERD (gastroesophageal reflux disease)    Hyperlipidemia    Hypertension    IBS (irritable bowel syndrome)    PMS (premenstrual syndrome)    Reflux     Past Surgical History:  Procedure Laterality Date   COLONOSCOPY  08/29/2011   Ahmad Vanwey: normal. high risk screening tcs in 5 years.    COLONOSCOPY WITH PROPOFOL N/A 10/20/2016   Procedure: COLONOSCOPY WITH PROPOFOL;  Surgeon: Daneil Dolin, MD;  [facility];  Service: Endoscopy;  Laterality: N/A;  10:45am   COLONOSCOPY WITH  PROPOFOL N/A 02/09/2022   Procedure: COLONOSCOPY WITH PROPOFOL;  Surgeon: Daneil Dolin, MD;  [facility];  Service: Endoscopy;  Laterality: N/A;  2:30 / ASA 2   ESOPHAGOGASTRODUODENOSCOPY (EGD) WITH PROPOFOL N/A 10/20/2016   Procedure: ESOPHAGOGASTRODUODENOSCOPY (EGD) WITH PROPOFOL;  Surgeon: Daneil Dolin, MD;  [facility];  Service: Endoscopy;  Laterality: N/A;   LUMBAR [place] N/A 10/20/2016   Procedure: Venia Minks DILATION;  Surgeon: Daneil Dolin, MD;  [facility];  Service: Endoscopy;  Laterality: N/A;   POLYPECTOMY  10/20/2016   Procedure: POLYPECTOMY;  Surgeon: Daneil Dolin, MD;  [facility];  Service: Endoscopy;;  cecal and sigmoid polypectomies    Prior to Admission medications   Medication Sig Start Date End Date Taking? Authorizing Provider  albuterol (VENTOLIN HFA) 108 (90 Base) MCG/ACT inhaler Inhale 1-2 puffs into the lungs every 6 (six) hours as needed for wheezing or shortness of breath. 08/10/19  Yes Avegno, Darrelyn Hillock, FNP  atorvastatin (LIPITOR) 20 MG tablet Take 1 tablet (20 mg total) by mouth at bedtime. 08/26/22  Yes Alvira Monday, FNP  budesonide-formoterol (SYMBICORT) 160-4.5 MCG/ACT inhaler Inhale 2 puffs into the lungs 2 (two) times daily. Patient taking differently: Inhale 2 puffs into the lungs daily as needed (Shortness of breath). 09/20/19  Yes Mikey Kirschner, MD  busPIRone (BUSPAR) 5 MG tablet TAKE (1) TABLET BY MOUTH (3) TIMES DAILY AS  NEEDED FOR ANXIETY 11/12/21  Yes Paseda, Dewaine Conger, FNP  Cholecalciferol (VITAMIN D) 125 MCG (5000 UT) CAPS Take 5,000 Units by mouth daily with breakfast. 08/26/22  Yes Alvira Monday, FNP  Cyanocobalamin (VITAMIN B-12) 5000 MCG SUBL Place 5,000 mcg under the tongue daily.   Yes [provider]  diclofenac Sodium (VOLTAREN) 1 % GEL Apply 2-4 grams to affected joint 4 times daily as needed. 01/19/22  Yes Ofilia Neas, PA-C  escitalopram (LEXAPRO) 20  MG tablet Take 1 tablet (20 mg total) by mouth daily. 08/26/22  Yes Alvira Monday, FNP  fluticasone (FLONASE) 50 MCG/ACT nasal spray Place 2 sprays into both nostrils daily. Patient taking differently: Place 2 sprays into both nostrils daily as needed for allergies. 04/01/19  Yes Mikey Kirschner, MD  hydrochlorothiazide (HYDRODIURIL) 25 MG tablet TAKE 1 TABLET BY MOUTH ONCE DAILY AS NEEDED 08/26/22  Yes Alvira Monday, FNP  hydrOXYzine (VISTARIL) 25 MG capsule TAKE 1 CAPSULE EVERY 8 HOURS AS NEEDED. 07/28/22  Yes Alvira Monday, FNP  levocetirizine (XYZAL) 5 MG tablet Take 1 tablet (5 mg total) by mouth every evening. 10/28/21  Yes Jaynee Eagles, PA-C  losartan (COZAAR) 50 MG tablet Take 1 tablet (50 mg total) by mouth daily. 08/26/22  Yes Alvira Monday, FNP  Omega-3 Fatty Acids (FISH OIL OMEGA-3) 1000 MG CAPS Take 1 g by mouth daily. 08/26/22  Yes Alvira Monday, FNP  omeprazole (PRILOSEC) 40 MG capsule Take 1 capsule (40 mg total) by mouth daily. 08/26/22  Yes Alvira Monday, FNP  Probiotic Product (PROBIOTIC DAILY PO) Take by mouth.   Yes [provider]  tiZANidine (ZANAFLEX) 4 MG tablet Take 1 tablet (4 mg total) by mouth every 6 (six) hours as needed for muscle spasms. 08/26/22  Yes Alvira Monday, FNP    Allergies as of 09/27/2022 - Review Complete 09/27/2022  Allergen Reaction Noted   Sulfa antibiotics Hives, Diarrhea, and Itching 07/20/2011   Other  07/20/2011   Penicillins Hives, Itching, and Rash 03/15/2017    Family History  Problem Relation Age of Onset   Hypertension Mother    Colon polyps Mother    Hypertension Father    Diabetes Father    Hypertension Sister    Hyperlipidemia Sister    Colon polyps Brother    Arthritis Brother    Diabetes Maternal Aunt    Hypertension Maternal Aunt    Rheum arthritis Maternal Grandmother    Prostate cancer Maternal Grandfather    Cancer Paternal Grandfather        not sure of type of cancer   Breast cancer Neg Hx    Cervical  cancer Neg Hx     Social History   Socioeconomic History   Marital status: Divorced    Spouse name: Not on file   Number of children: Not on file   Years of education: Not on file   Highest education level: Not on file  Occupational History   Not on file  Tobacco Use   Smoking status: Never    Passive exposure: Current   Smokeless tobacco: Never  Vaping Use   Vaping Use: Never used  Substance and Sexual Activity   Alcohol use: Yes    Comment: occ   Drug use: No   Sexual activity: Yes    Birth control/protection: None, Post-menopausal  Other Topics Concern   Not on file  Social History Narrative   Lives with her brother. Pt is on disability from her lumbar fusion.    Social  Determinants of Health   Financial Resource Strain: Not on file  Food Insecurity: Not on file  Transportation Needs: Not on file  Physical Activity: Not on file  Stress: Not on file  Social Connections: Not on file  Intimate Partner Violence: Not on file    Review of Systems: See HPI, otherwise negative ROS  Physical Exam: BP 121/74 (BP Location: Right Arm, Patient Position: Sitting, Cuff Size: Large)   Pulse 69   Temp 98.2 F (36.8 C) (Oral)   Ht '5\' 5"'$  (1.651 m)   Wt 171 lb 9.6 oz (77.8 kg)   LMP 09/03/2013   SpO2 94%   BMI 28.56 kg/m  General:   Alert,   pleasant and cooperative in NAD Neck:  Supple; no masses or thyromegaly. No significant cervical adenopathy. Lungs:  Clear throughout to auscultation.   No wheezes, crackles, or rhonchi. No acute distress. Heart:  Regular rate and rhythm; no murmurs, clicks, rubs,  or gallops. Abdomen: Non-distended, normal bowel sounds.  Soft and nontender without appreciable mass or hepatosplenomegaly.    Impression/Plan:    62 year old lady with persistently, mildly elevated aminotransferases over time.  Ferritin also elevated.  Genetic markers negative for hemochromatosis.  She is exposed to alcohol on a regular (daily basis);   frequently  imbibes large amounts of alcohol on the weekends or when she is traveling.  She does relate having her labs drawn shortly after returning from party weekends.  May well be that we are dealing with nothing more than alcohol related fatty liver disease with low-grade alcohol induced hepatitis.  Elevated ferritin more likely related reactive to ethanol consumption than with non-HFE hemochromatosis.  She tells me she would have absolutely no problem in completely abstaining from alcohol for 3 months and having her LFTs reassessed.  Elevated light chains of uncertain significance.     Recommendations:  Eliminate all forms of alcohol intake (liquor, beer, wine, etc.) x 3 months  repeat hepatic function profile and serum ferritin in 3 months.  If normal, may not need a liver biopsy  Baseline ultrasound  (elastography)  Further recommendations to follow.  Office visit with Neil Crouch in 4 months  Keep  upcoming appointment with the hematologist     Notice: This dictation was prepared with Dragon dictation along with smaller phrase technology. Any transcriptional errors that result from this process are unintentional and may not be corrected upon review.

## 2022-09-27 NOTE — Patient Instructions (Signed)
It was nice to see you again today!  There are many different causes for elevated liver enzymes.  Your liver enzymes are minimally elevated and may likely be due to alcohol exposure.  Other causes less likely.    As discussed, please eliminate all forms of alcohol intake (liquor, beer, wine, etc.) x 3 months   repeat hepatic function profile and serum ferritin in 3 months.  If normal, may not need a liver biopsy   we are going to get an ultrasound of your liver and measure for scarring (elastography)   Further recommendations to follow.  Office visit with Neil Crouch in 4 months   keep your upcoming appointment with the hematologist

## 2022-09-28 ENCOUNTER — Telehealth: Payer: Self-pay | Admitting: *Deleted

## 2022-09-28 ENCOUNTER — Other Ambulatory Visit: Payer: Self-pay | Admitting: *Deleted

## 2022-09-28 DIAGNOSIS — R7989 Other specified abnormal findings of blood chemistry: Secondary | ICD-10-CM

## 2022-09-28 NOTE — Telephone Encounter (Signed)
[place] scheduled for 2/16, arrival 10:15am, npo midnight  Called pt, LMOVM to call back

## 2022-09-29 ENCOUNTER — Other Ambulatory Visit: Payer: Self-pay | Admitting: *Deleted

## 2022-09-29 DIAGNOSIS — Z1231 Encounter for screening mammogram for malignant neoplasm of breast: Secondary | ICD-10-CM

## 2022-09-29 NOTE — Telephone Encounter (Signed)
Called pt and she is aware of appt details.

## 2022-10-06 NOTE — Progress Notes (Shared)
[place], [place] [address]   Clinic Day:  10/07/2022  Referring physician: Mahala Menghini, PA-C  Patient Care Team: Alvira Monday, [place] as PCP - General (Family Medicine)   ASSESSMENT & PLAN:   Assessment: ***  Plan: ***  No orders of the defined types were placed in this encounter.     Beverly Gust Oliver,acting as a scribe for Derek Jack, MD.,have documented all relevant documentation on the behalf of Derek Jack, MD,as directed by  Derek Jack, MD while in the presence of Derek Jack, MD.   ***  [name]   2/16/20248:11 AM  CHIEF COMPLAINT/PURPOSE OF CONSULT:   Diagnosis: elevated aminotransferases and ferritin   Current Therapy: Working Up  HISTORY OF PRESENT ILLNESS:   Laura Scott is a 62 y.o. female presenting to clinic today for evaluation of elevated aminotransferases and ferritin  at the request of Dr.Robert [name].  Today, she states that she is doing well overall. Her appetite level is at ***%. Her energy level is at ***%.  ***She was found to have abnormal CBC from *** ***She denies recent chest pain on exertion, shortness of breath on minimal exertion, pre-syncopal episodes, or palpitations. ***She had not noticed any recent bleeding such as epistaxis, hematuria or hematochezia ***The patient denies over the counter NSAID ingestion. She is not *** on antiplatelets agents. Her last colonoscopy was 02/09/2022.  ***She had no prior history or diagnosis of cancer. She denies any family history of cancer. / *** Her age appropriate screening programs are up-to-date. ***She denies any pica and eats a variety of diet. ***She never donated blood or received blood transfusion. ***The patient was prescribed oral iron supplements and she takes ***  PAST MEDICAL HISTORY:   Past Medical History: Past Medical History:  Diagnosis Date   Allergic rhinitis    Anxiety    Chronic pain     Depression    Diabetes mellitus without complication (HCC)    GERD (gastroesophageal reflux disease)    Hyperlipidemia    Hypertension    IBS (irritable bowel syndrome)    PMS (premenstrual syndrome)    Reflux     Surgical History: Past Surgical History:  Procedure Laterality Date   COLONOSCOPY  08/29/2011   [name]: normal. high risk screening tcs in 5 years.    COLONOSCOPY WITH PROPOFOL N/A 10/20/2016   Procedure: COLONOSCOPY WITH PROPOFOL;  Surgeon: Daneil Dolin, MD;  [facility];  Service: Endoscopy;  Laterality: N/A;  10:45am   COLONOSCOPY WITH PROPOFOL N/A 02/09/2022   Procedure: COLONOSCOPY WITH PROPOFOL;  Surgeon: Daneil Dolin, MD;  [facility];  Service: Endoscopy;  Laterality: N/A;  2:30 / ASA 2   ESOPHAGOGASTRODUODENOSCOPY (EGD) WITH PROPOFOL N/A 10/20/2016   Procedure: ESOPHAGOGASTRODUODENOSCOPY (EGD) WITH PROPOFOL;  Surgeon: Daneil Dolin, MD;  [facility];  Service: Endoscopy;  Laterality: N/A;   LUMBAR [place] N/A 10/20/2016   Procedure: Venia Minks DILATION;  Surgeon: Daneil Dolin, MD;  [facility];  Service: Endoscopy;  Laterality: N/A;   POLYPECTOMY  10/20/2016   Procedure: POLYPECTOMY;  Surgeon: Daneil Dolin, MD;  [facility];  Service: Endoscopy;;  cecal and sigmoid polypectomies    Social History: Social History   Socioeconomic History   Marital status: Divorced    Spouse name: Not on file   Number of children: Not on file   Years of education:  Not on file   Highest education level: Not on file  Occupational History   Not on file  Tobacco Use   Smoking status: Never    Passive exposure: Current   Smokeless tobacco: Never  Vaping Use   Vaping Use: Never used  Substance and Sexual Activity   Alcohol use: Yes    Comment: occ   Drug use: No   Sexual activity: Yes    Birth control/protection: None, Post-menopausal  Other Topics Concern   Not on file  Social History  Narrative   Lives with her brother. Pt is on disability from her lumbar fusion.    Social Determinants of Health   Financial Resource Strain: Not on file  Food Insecurity: Not on file  Transportation Needs: Not on file  Physical Activity: Not on file  Stress: Not on file  Social Connections: Not on file  Intimate Partner Violence: Not on file    Family History: Family History  Problem Relation Age of Onset   Hypertension Mother    Colon polyps Mother    Hypertension Father    Diabetes Father    Hypertension Sister    Hyperlipidemia Sister    Colon polyps Brother    Arthritis Brother    Diabetes Maternal Aunt    Hypertension Maternal Aunt    Rheum arthritis Maternal Grandmother    Prostate cancer Maternal Grandfather    Cancer Paternal Grandfather        not sure of type of cancer   Breast cancer Neg Hx    Cervical cancer Neg Hx     Current Medications:  Current Outpatient Medications:    albuterol (VENTOLIN HFA) 108 (90 Base) MCG/ACT inhaler, Inhale 1-2 puffs into the lungs every 6 (six) hours as needed for wheezing or shortness of breath., Disp: 18 g, Rfl: 0   atorvastatin (LIPITOR) 20 MG tablet, Take 1 tablet (20 mg total) by mouth at bedtime., Disp: 90 tablet, Rfl: 0   budesonide-formoterol (SYMBICORT) 160-4.5 MCG/ACT inhaler, Inhale 2 puffs into the lungs 2 (two) times daily. (Patient taking differently: Inhale 2 puffs into the lungs daily as needed (Shortness of breath).), Disp: 1 Inhaler, Rfl: 5   busPIRone (BUSPAR) 5 MG tablet, TAKE (1) TABLET BY MOUTH (3) TIMES DAILY AS NEEDED FOR ANXIETY, Disp: 90 tablet, Rfl: 5   Cholecalciferol (VITAMIN D) 125 MCG (5000 UT) CAPS, Take 5,000 Units by mouth daily with breakfast., Disp: 30 capsule, Rfl: 2   Cyanocobalamin (VITAMIN B-12) 5000 MCG SUBL, Place 5,000 mcg under the tongue daily., Disp: , Rfl:    diclofenac Sodium (VOLTAREN) 1 % GEL, Apply 2-4 grams to affected joint 4 times daily as needed., Disp: 400 g, Rfl: 2    escitalopram (LEXAPRO) 20 MG tablet, Take 1 tablet (20 mg total) by mouth daily., Disp: 90 tablet, Rfl: 1   fluticasone (FLONASE) 50 MCG/ACT nasal spray, Place 2 sprays into both nostrils daily. (Patient taking differently: Place 2 sprays into both nostrils daily as needed for allergies.), Disp: 48 g, Rfl: 5   hydrochlorothiazide (HYDRODIURIL) 25 MG tablet, TAKE 1 TABLET BY MOUTH ONCE DAILY AS NEEDED, Disp: 90 tablet, Rfl: 1   hydrOXYzine (VISTARIL) 25 MG capsule, TAKE 1 CAPSULE EVERY 8 HOURS AS NEEDED., Disp: 30 capsule, Rfl: 0   levocetirizine (XYZAL) 5 MG tablet, Take 1 tablet (5 mg total) by mouth every evening., Disp: 90 tablet, Rfl: 0   losartan (COZAAR) 50 MG tablet, Take 1 tablet (50 mg total) by mouth daily., Disp: 90  tablet, Rfl: 1   Omega-3 Fatty Acids (FISH OIL OMEGA-3) 1000 MG CAPS, Take 1 g by mouth daily., Disp: 90 capsule, Rfl: 1   omeprazole (PRILOSEC) 40 MG capsule, Take 1 capsule (40 mg total) by mouth daily., Disp: 90 capsule, Rfl: 1   Probiotic Product (PROBIOTIC DAILY PO), Take by mouth., Disp: , Rfl:    tiZANidine (ZANAFLEX) 4 MG tablet, Take 1 tablet (4 mg total) by mouth every 6 (six) hours as needed for muscle spasms., Disp: 30 tablet, Rfl: 0   Allergies: Allergies  Allergen Reactions   Sulfa Antibiotics Hives, Diarrhea and Itching   Other     SURGICAL TAPE Reaction: rash    Penicillins Hives, Itching and Rash    Pt states that she is able to take PCN  Has patient had a PCN reaction causing immediate rash, facial/tongue/throat swelling, SOB or lightheadedness with hypotension: Yes Has patient had a PCN reaction causing severe rash involving mucus membranes or skin necrosis: No Has patient had a PCN reaction that required hospitalization: No Has patient had a PCN reaction occurring within the last 10 years: Yes If all of the above answers are "NO", then may proceed with Cephalosporin use.    REVIEW OF SYSTEMS:   Review of Systems - Oncology   VITALS:   Last  menstrual period 09/03/2013.  Wt Readings from Last 3 Encounters:  09/27/22 171 lb 9.6 oz (77.8 kg)  09/12/22 169 lb 1.9 oz (76.7 kg)  08/26/22 167 lb 0.6 oz (75.8 kg)    There is no height or weight on file to calculate BMI.   PHYSICAL EXAM:   Physical Exam  LABS:      Latest Ref Rng & Units 08/26/2022    2:13 PM 09/02/2021   12:10 PM 03/01/2021   11:43 AM  CBC  WBC 3.4 - 10.8 x10E3/uL 7.8  7.6  5.9   Hemoglobin 11.1 - 15.9 g/dL 13.3  13.9  12.9   Hematocrit 34.0 - 46.6 % 37.7  41.4  37.1   Platelets 150 - 450 x10E3/uL 310  377  264       Latest Ref Rng & Units 09/12/2022    1:50 PM 07/26/2022   11:44 AM 07/26/2022   11:42 AM  CMP  Glucose 70 - 99 mg/dL   148   BUN 8 - 27 mg/dL   9   Creatinine 0.57 - 1.00 mg/dL   0.84   Sodium 134 - 144 mmol/L   139   Potassium 3.5 - 5.2 mmol/L   4.2   Chloride 96 - 106 mmol/L   98   CO2 20 - 29 mmol/L   24   Calcium 8.7 - 10.3 mg/dL   10.3   Total Protein 6.0 - 8.5 g/dL 8.0  7.9  8.0   Total Bilirubin 0.0 - 1.2 mg/dL  0.9  0.8   Alkaline Phos 44 - 121 IU/L  94  97   AST 0 - 40 IU/L  48  49   ALT 0 - 32 IU/L  39  40      No results found for: "CEA1", "CEA" / No results found for: "CEA1", "CEA" No results found for: "PSA1" No results found for: "[id number]" No results found for: "CAN125"  Lab Results  Component Value Date   ALBUMINELP 4.3 09/12/2022   A1GS 0.3 12/02/2021   A2GS 0.8 12/02/2021   BETS 0.5 12/02/2021   BETA2SER 0.5 12/02/2021   GAMS 1.2 12/02/2021   MSPIKE Not  Observed 09/12/2022   SPEI  12/02/2021     Comment:     Normal Serum Protein Electrophoresis Pattern. No abnormal protein bands (M-protein) detected.    Lab Results  Component Value Date   TIBC 276 09/02/2021   FERRITIN 588 (H) 07/26/2022   FERRITIN 444 (H) 12/01/2021   FERRITIN 441 (H) 11/30/2021   IRONPCTSAT 43 09/02/2021   No results found for: "LDH"   STUDIES:   No results found.

## 2022-10-07 ENCOUNTER — Ambulatory Visit (HOSPITAL_COMMUNITY)
Admission: RE | Admit: 2022-10-07 | Discharge: 2022-10-07 | Disposition: A | Discharge status: Home / Self Care | Payer: No Typology Code available for payment source | Source: Ambulatory Visit | Attending: Internal Medicine | Admitting: Internal Medicine

## 2022-10-07 ENCOUNTER — Other Ambulatory Visit (HOSPITAL_COMMUNITY)
Admission: RE | Admit: 2022-10-07 | Discharge: 2022-10-07 | Disposition: A | Discharge status: Home / Self Care | Payer: No Typology Code available for payment source | Source: Ambulatory Visit | Attending: Internal Medicine | Admitting: Internal Medicine

## 2022-10-07 ENCOUNTER — Other Ambulatory Visit: Payer: Self-pay

## 2022-10-07 ENCOUNTER — Inpatient Hospital Stay: Payer: No Typology Code available for payment source | Attending: Hematology | Admitting: Hematology

## 2022-10-07 ENCOUNTER — Other Ambulatory Visit: Payer: Self-pay | Admitting: Family Medicine

## 2022-10-07 DIAGNOSIS — F419 Anxiety disorder, unspecified: Secondary | ICD-10-CM

## 2022-10-07 DIAGNOSIS — R7989 Other specified abnormal findings of blood chemistry: Secondary | ICD-10-CM

## 2022-10-07 DIAGNOSIS — M62838 Other muscle spasm: Secondary | ICD-10-CM

## 2022-10-07 DIAGNOSIS — R945 Abnormal results of liver function studies: Secondary | ICD-10-CM | POA: Diagnosis not present

## 2022-10-07 LAB — FERRITIN: Ferritin: 260 ng/mL (ref 11–307)

## 2022-10-07 LAB — HEPATIC FUNCTION PANEL
ALT: 52 U/L — ABNORMAL HIGH (ref 0–44)
AST: 56 U/L — ABNORMAL HIGH (ref 15–41)
Albumin: 4.1 g/dL (ref 3.5–5.0)
Alkaline Phosphatase: 86 U/L (ref 38–126)
Bilirubin, Direct: 0.1 mg/dL (ref 0.0–0.2)
Indirect Bilirubin: 0.8 mg/dL (ref 0.3–0.9)
Total Bilirubin: 0.9 mg/dL (ref 0.3–1.2)
Total Protein: 7.8 g/dL (ref 6.5–8.1)

## 2022-10-07 MED ORDER — HYDROXYZINE PAMOATE 25 MG PO CAPS: ORAL_CAPSULE | ORAL | 0 refills | Status: DC

## 2022-10-10 ENCOUNTER — Other Ambulatory Visit: Payer: Self-pay

## 2022-10-10 DIAGNOSIS — R7989 Other specified abnormal findings of blood chemistry: Secondary | ICD-10-CM

## 2022-10-13 ENCOUNTER — Other Ambulatory Visit: Payer: Self-pay | Admitting: Family Medicine

## 2022-10-13 ENCOUNTER — Telehealth: Payer: Self-pay | Admitting: Family Medicine

## 2022-10-13 DIAGNOSIS — M62838 Other muscle spasm: Secondary | ICD-10-CM

## 2022-10-13 MED ORDER — TIZANIDINE HCL 4 MG PO TABS: 4.0000 mg | ORAL_TABLET | Freq: Four times a day (QID) | ORAL | 0 refills | Status: DC | PRN

## 2022-10-13 NOTE — Telephone Encounter (Signed)
Rx sent 

## 2022-10-13 NOTE — Telephone Encounter (Signed)
Patient needs refill on  tiZANidine (ZANAFLEX) 4 MG tablet  [facility] - [place], [place] - [address] [place], [place] [address] Phone: [phone]  Fax: [phone]

## 2022-11-11 ENCOUNTER — Ambulatory Visit
Admission: RE | Admit: 2022-11-11 | Discharge: 2022-11-11 | Disposition: A | Discharge status: Home / Self Care | Payer: No Typology Code available for payment source | Source: Ambulatory Visit | Attending: *Deleted | Admitting: *Deleted

## 2022-11-11 DIAGNOSIS — Z1231 Encounter for screening mammogram for malignant neoplasm of breast: Secondary | ICD-10-CM | POA: Diagnosis not present

## 2022-11-16 ENCOUNTER — Other Ambulatory Visit: Payer: Self-pay | Admitting: Internal Medicine

## 2022-11-16 DIAGNOSIS — R928 Other abnormal and inconclusive findings on diagnostic imaging of breast: Secondary | ICD-10-CM

## 2022-11-21 ENCOUNTER — Other Ambulatory Visit: Payer: Self-pay | Admitting: Internal Medicine

## 2022-11-21 ENCOUNTER — Ambulatory Visit
Admission: RE | Admit: 2022-11-21 | Discharge: 2022-11-21 | Disposition: A | Discharge status: Home / Self Care | Payer: No Typology Code available for payment source | Source: Ambulatory Visit | Attending: Internal Medicine

## 2022-11-21 DIAGNOSIS — R928 Other abnormal and inconclusive findings on diagnostic imaging of breast: Secondary | ICD-10-CM

## 2022-11-21 DIAGNOSIS — R921 Mammographic calcification found on diagnostic imaging of breast: Secondary | ICD-10-CM | POA: Diagnosis not present

## 2022-11-28 ENCOUNTER — Ambulatory Visit: Payer: No Typology Code available for payment source | Admitting: Family Medicine

## 2022-11-29 ENCOUNTER — Ambulatory Visit (INDEPENDENT_AMBULATORY_CARE_PROVIDER_SITE_OTHER): Payer: No Typology Code available for payment source | Admitting: Family Medicine

## 2022-11-29 ENCOUNTER — Encounter: Payer: Self-pay | Admitting: Family Medicine

## 2022-11-29 VITALS — BP 132/70 | HR 90 | Ht 65.5 in | Wt 167.1 lb

## 2022-11-29 DIAGNOSIS — E1165 Type 2 diabetes mellitus with hyperglycemia: Secondary | ICD-10-CM | POA: Diagnosis not present

## 2022-11-29 DIAGNOSIS — F32A Depression, unspecified: Secondary | ICD-10-CM | POA: Diagnosis not present

## 2022-11-29 DIAGNOSIS — M79672 Pain in left foot: Secondary | ICD-10-CM | POA: Insufficient documentation

## 2022-11-29 DIAGNOSIS — K219 Gastro-esophageal reflux disease without esophagitis: Secondary | ICD-10-CM

## 2022-11-29 DIAGNOSIS — E7849 Other hyperlipidemia: Secondary | ICD-10-CM | POA: Diagnosis not present

## 2022-11-29 DIAGNOSIS — I10 Essential (primary) hypertension: Secondary | ICD-10-CM | POA: Diagnosis not present

## 2022-11-29 DIAGNOSIS — E559 Vitamin D deficiency, unspecified: Secondary | ICD-10-CM

## 2022-11-29 DIAGNOSIS — F419 Anxiety disorder, unspecified: Secondary | ICD-10-CM | POA: Diagnosis not present

## 2022-11-29 DIAGNOSIS — G479 Sleep disorder, unspecified: Secondary | ICD-10-CM | POA: Diagnosis not present

## 2022-11-29 DIAGNOSIS — E038 Other specified hypothyroidism: Secondary | ICD-10-CM

## 2022-11-29 MED ORDER — HYDROXYZINE PAMOATE 50 MG PO CAPS
50.0000 mg | ORAL_CAPSULE | Freq: Every evening | ORAL | 1 refills | Status: DC | PRN
Start: 2022-11-29 — End: 2023-02-08

## 2022-11-29 NOTE — Assessment & Plan Note (Signed)
She takes omeprazole 40 mg daily No complaints or concerns voiced

## 2022-11-29 NOTE — Progress Notes (Signed)
Established Patient Office Visit  Subjective:  Patient ID: Laura Scott, female    DOB: [birth year]  Age: 62 y.o. MRN: [id number]  CC:  Chief Complaint  Patient presents with   Follow-up    3 month f/u, would like to discuss anxiety medication and something to help her sleep.     HPI Laura Scott is a 62 y.o. female with past medical history of hypertension, hyperlipidemia, anxiety, and GERD presents for f/u of  chronic medical conditions. For the details of today's visit, please refer to the assessment and plan.       Past Medical History:  Diagnosis Date   Allergic rhinitis    Anxiety    Chronic pain    Depression    Diabetes mellitus without complication    GERD (gastroesophageal reflux disease)    Hyperlipidemia    Hypertension    IBS (irritable bowel syndrome)    PMS (premenstrual syndrome)    Reflux     Past Surgical History:  Procedure Laterality Date   COLONOSCOPY  08/29/2011   [name]: normal. high risk screening tcs in 5 years.    COLONOSCOPY WITH PROPOFOL N/A 10/20/2016   Procedure: COLONOSCOPY WITH PROPOFOL;  Surgeon: Corbin Ade, MD;  Location: [facility];  Service: Endoscopy;  Laterality: N/A;  10:45am   COLONOSCOPY WITH PROPOFOL N/A 02/09/2022   Procedure: COLONOSCOPY WITH PROPOFOL;  Surgeon: Corbin Ade, MD;  Location: [facility];  Service: Endoscopy;  Laterality: N/A;  2:30 / ASA 2   ESOPHAGOGASTRODUODENOSCOPY (EGD) WITH PROPOFOL N/A 10/20/2016   Procedure: ESOPHAGOGASTRODUODENOSCOPY (EGD) WITH PROPOFOL;  Surgeon: Corbin Ade, MD;  Location: [facility];  Service: Endoscopy;  Laterality: N/A;   LUMBAR DISC SURGERY     MALONEY DILATION N/A 10/20/2016   Procedure: Elease Hashimoto DILATION;  Surgeon: Corbin Ade, MD;  Location: [facility];  Service: Endoscopy;  Laterality: N/A;   POLYPECTOMY  10/20/2016   Procedure: POLYPECTOMY;  Surgeon: Corbin Ade, MD;  Location: [facility];  Service: Endoscopy;;  cecal and sigmoid polypectomies     Family History  Problem Relation Age of Onset   Hypertension Mother    Colon polyps Mother    Hypertension Father    Diabetes Father    Hypertension Sister    Hyperlipidemia Sister    Colon polyps Brother    Arthritis Brother    Diabetes Maternal Aunt    Hypertension Maternal Aunt    Rheum arthritis Maternal Grandmother    Prostate cancer Maternal Grandfather    Cancer Paternal Grandfather        not sure of type of cancer   Breast cancer Neg Hx    Cervical cancer Neg Hx     Social History   Socioeconomic History   Marital status: Divorced    Spouse name: Not on file   Number of children: Not on file   Years of education: Not on file   Highest education level: Not on file  Occupational History   Not on file  Tobacco Use   Smoking status: Never    Passive exposure: Current   Smokeless tobacco: Never  Vaping Use   Vaping Use: Never used  Substance and Sexual Activity   Alcohol use: Yes    Comment: occ   Drug use: No   Sexual activity: Yes    Birth control/protection: None, Post-menopausal  Other Topics Concern   Not on file  Social History Narrative   Lives with  her brother. Pt is on disability from her lumbar fusion.    Social Determinants of Health   Financial Resource Strain: Not on file  Food Insecurity: Not on file  Transportation Needs: Not on file  Physical Activity: Not on file  Stress: Not on file  Social Connections: Not on file  Intimate Partner Violence: Not on file    Outpatient Medications Prior to Visit  Medication Sig Dispense Refill   albuterol (VENTOLIN HFA) 108 (90 Base) MCG/ACT inhaler Inhale 1-2 puffs into the lungs every 6 (six) hours as needed for wheezing or shortness of breath. 18 g 0   atorvastatin (LIPITOR) 20 MG tablet Take 1 tablet (20 mg total) by mouth at bedtime. 90 tablet 0   budesonide-formoterol (SYMBICORT) 160-4.5 MCG/ACT inhaler Inhale 2 puffs into the lungs 2 (two) times daily. (Patient taking differently: Inhale  2 puffs into the lungs daily as needed (Shortness of breath).) 1 Inhaler 5   busPIRone (BUSPAR) 5 MG tablet TAKE (1) TABLET BY MOUTH (3) TIMES DAILY AS NEEDED FOR ANXIETY 90 tablet 5   Cholecalciferol (VITAMIN D) 125 MCG (5000 UT) CAPS Take 5,000 Units by mouth daily with breakfast. 30 capsule 2   Cyanocobalamin (VITAMIN B-12) 5000 MCG SUBL Place 5,000 mcg under the tongue daily.     diclofenac Sodium (VOLTAREN) 1 % GEL Apply 2-4 grams to affected joint 4 times daily as needed. 400 g 2   escitalopram (LEXAPRO) 20 MG tablet Take 1 tablet (20 mg total) by mouth daily. 90 tablet 1   fluticasone (FLONASE) 50 MCG/ACT nasal spray Place 2 sprays into both nostrils daily. (Patient taking differently: Place 2 sprays into both nostrils daily as needed for allergies.) 48 g 5   hydrochlorothiazide (HYDRODIURIL) 25 MG tablet TAKE 1 TABLET BY MOUTH ONCE DAILY AS NEEDED 90 tablet 1   levocetirizine (XYZAL) 5 MG tablet Take 1 tablet (5 mg total) by mouth every evening. 90 tablet 0   losartan (COZAAR) 50 MG tablet Take 1 tablet (50 mg total) by mouth daily. 90 tablet 1   Omega-3 Fatty Acids (FISH OIL OMEGA-3) 1000 MG CAPS Take 1 g by mouth daily. 90 capsule 1   omeprazole (PRILOSEC) 40 MG capsule Take 1 capsule (40 mg total) by mouth daily. 90 capsule 1   Probiotic Product (PROBIOTIC DAILY PO) Take by mouth.     tiZANidine (ZANAFLEX) 4 MG tablet Take 1 tablet (4 mg total) by mouth every 6 (six) hours as needed for muscle spasms. 30 tablet 0   hydrOXYzine (VISTARIL) 25 MG capsule Take 2 capsule every 8 hours as needed. 30 capsule 0   No facility-administered medications prior to visit.    Allergies  Allergen Reactions   Sulfa Antibiotics Hives, Diarrhea and Itching   Other     SURGICAL TAPE Reaction: rash    Penicillins Hives, Itching and Rash    Pt states that she is able to take PCN  Has patient had a PCN reaction causing immediate rash, facial/tongue/throat swelling, SOB or lightheadedness with  hypotension: Yes Has patient had a PCN reaction causing severe rash involving mucus membranes or skin necrosis: No Has patient had a PCN reaction that required hospitalization: No Has patient had a PCN reaction occurring within the last 10 years: Yes If all of the above answers are "NO", then may proceed with Cephalosporin use.    ROS Review of Systems  Constitutional:  Negative for chills and fever.  Eyes:  Negative for visual disturbance.  Respiratory:  Negative  for chest tightness and shortness of breath.   Musculoskeletal:        Sharp pain in the left third toe  Neurological:  Negative for dizziness and headaches.      Objective:    Physical Exam HENT:     Head: Normocephalic.     Mouth/Throat:     Mouth: Mucous membranes are moist.  Cardiovascular:     Rate and Rhythm: Normal rate.     Heart sounds: Normal heart sounds.  Pulmonary:     Effort: Pulmonary effort is normal.     Breath sounds: Normal breath sounds.  Musculoskeletal:     Right foot: Normal range of motion. No swelling or deformity.     Left foot: Normal range of motion. No swelling or deformity.  Neurological:     Mental Status: She is alert.     BP 132/70 (BP Location: Left Arm)   Pulse 90   Ht 5' 5.5" (1.664 m)   Wt 167 lb 1.3 oz (75.8 kg)   LMP 09/03/2013   SpO2 94%   BMI 27.38 kg/m  Wt Readings from Last 3 Encounters:  11/29/22 167 lb 1.3 oz (75.8 kg)  09/27/22 171 lb 9.6 oz (77.8 kg)  09/12/22 169 lb 1.9 oz (76.7 kg)    Lab Results  Component Value Date   TSH 2.220 08/26/2022   Lab Results  Component Value Date   WBC 7.8 08/26/2022   HGB 13.3 08/26/2022   HCT 37.7 08/26/2022   MCV 91 08/26/2022   PLT 310 08/26/2022   Lab Results  Component Value Date   NA 139 07/26/2022   K 4.2 07/26/2022   CO2 24 07/26/2022   GLUCOSE 148 (H) 07/26/2022   BUN 9 07/26/2022   CREATININE 0.84 07/26/2022   BILITOT 0.9 10/07/2022   ALKPHOS 86 10/07/2022   AST 56 (H) 10/07/2022   ALT 52 (H)  10/07/2022   PROT 7.8 10/07/2022   ALBUMIN 4.1 10/07/2022   CALCIUM 10.3 07/26/2022   ANIONGAP 13 02/07/2022   EGFR 79 07/26/2022   Lab Results  Component Value Date   CHOL 201 (H) 07/26/2022   Lab Results  Component Value Date   HDL 52 07/26/2022   Lab Results  Component Value Date   LDLCALC 115 (H) 07/26/2022   Lab Results  Component Value Date   TRIG 197 (H) 07/26/2022   Lab Results  Component Value Date   CHOLHDL 3.9 07/26/2022   Lab Results  Component Value Date   HGBA1C 6.2 08/19/2022      Assessment & Plan:  Essential hypertension, benign Assessment & Plan: Controlled She takes losartan 50 mg daily She denies headaches, dizziness, blurred vision, chest pain, palpitation, and shortness of breath Encouraged to continue treatment regimen BP Readings from Last 3 Encounters:  11/29/22 132/70  09/27/22 121/74  09/12/22 (!) 164/80      Orders: -     CBC with Differential/Platelet -     CMP14+EGFR  Foot pain, left Assessment & Plan: Onset symptoms 2 weeks ago Complains of a sharp pain in her left third toe that is self-limiting Pain occurs at nighttime No injury or trauma reported Denies numbness and tingling Left foot is neurovascularly intact 2+ dorsalis pedis and posterior tibial pulses Range of motion is intact Sensation is intact Gait intact No deformity visualized Will assess patient's hemoglobin A1c today Symptom likely of neuropathy   Anxiety and depression Assessment & Plan: GAD-7 is 12 PHQ-9 is 11 She reports minimal  relief of her symptoms with Lexapro 20 mg daily She also complains of difficulty falling asleep Of note, the patient has been taking hydroxyzine 25 mg We will increase the dose to 50 mg nightly Denies suicidal thoughts and ideation We will follow-up on symptoms in 4 weeks   Gastroesophageal reflux disease without esophagitis Assessment & Plan: She takes omeprazole 40 mg daily No complaints or concerns  voiced   Sleep disturbance -     hydrOXYzine Pamoate; Take 1 capsule (50 mg total) by mouth at bedtime as needed.  Dispense: 30 capsule; Refill: 1  Other hyperlipidemia -     Lipid panel  Type 2 diabetes mellitus with hyperglycemia, without long-term current use of insulin -     Hemoglobin A1c -     HM Diabetes Foot Exam  Vitamin D deficiency -     VITAMIN D 25 Hydroxy (Vit-D Deficiency, Fractures)  Other specified hypothyroidism -     TSH + free T4    Follow-up: Return in about 4 weeks (around 12/27/2022).   Gilmore LarocheGloria  Derion Kreiter, FNP

## 2022-11-29 NOTE — Assessment & Plan Note (Signed)
Controlled She takes losartan 50 mg daily She denies headaches, dizziness, blurred vision, chest pain, palpitation, and shortness of breath Encouraged to continue treatment regimen BP Readings from Last 3 Encounters:  11/29/22 132/70  09/27/22 121/74  09/12/22 (!) 164/80

## 2022-11-29 NOTE — Assessment & Plan Note (Addendum)
Onset symptoms 2 weeks ago Complains of a sharp pain in her left third toe that is self-limiting Pain occurs at nighttime No injury or trauma reported Denies numbness and tingling Left foot is neurovascularly intact 2+ dorsalis pedis and posterior tibial pulses Range of motion is intact Sensation is intact Gait intact No deformity visualized Will assess patient's hemoglobin A1c today Symptom likely of neuropathy

## 2022-11-29 NOTE — Assessment & Plan Note (Signed)
GAD-7 is 12 PHQ-9 is 11 She reports minimal relief of her symptoms with Lexapro 20 mg daily She also complains of difficulty falling asleep Of note, the patient has been taking hydroxyzine 25 mg We will increase the dose to 50 mg nightly Denies suicidal thoughts and ideation We will follow-up on symptoms in 4 weeks

## 2022-11-29 NOTE — Patient Instructions (Addendum)
I appreciate the opportunity to provide care to you today!    Follow up: 4 weeks  Labs: please stop by the lab today/ during the week to get your blood drawn (CBC, CMP, TSH, Lipid profile, HgA1c, Vit D)  Please pick up your prescription at the pharmacy  Please continue to a heart-healthy diet and increase your physical activities. Try to exercise for at least five days a week.      It was a pleasure to see you and I look forward to continuing to work together on your health and well-being. Please do not hesitate to call the office if you need care or have questions about your care.   Have a wonderful day and week. With Gratitude, Gilmore Laroche MSN, FNP-BC

## 2022-12-01 DIAGNOSIS — E559 Vitamin D deficiency, unspecified: Secondary | ICD-10-CM | POA: Diagnosis not present

## 2022-12-01 DIAGNOSIS — E038 Other specified hypothyroidism: Secondary | ICD-10-CM | POA: Diagnosis not present

## 2022-12-01 DIAGNOSIS — E1165 Type 2 diabetes mellitus with hyperglycemia: Secondary | ICD-10-CM | POA: Diagnosis not present

## 2022-12-01 DIAGNOSIS — I10 Essential (primary) hypertension: Secondary | ICD-10-CM | POA: Diagnosis not present

## 2022-12-01 DIAGNOSIS — E7849 Other hyperlipidemia: Secondary | ICD-10-CM | POA: Diagnosis not present

## 2022-12-02 LAB — LIPID PANEL
Chol/HDL Ratio: 4.3 ratio (ref 0.0–4.4)
Cholesterol, Total: 172 mg/dL (ref 100–199)
HDL: 40 mg/dL (ref >39)
LDL Chol Calc (NIH): 105 mg/dL — ABNORMAL HIGH (ref 0–99)
Triglycerides: 151 mg/dL — ABNORMAL HIGH (ref 0–149)
VLDL Cholesterol Cal: 27 mg/dL (ref 5–40)

## 2022-12-02 LAB — CBC WITH DIFFERENTIAL/PLATELET
Basophils Absolute: 0 10*3/uL (ref 0.0–0.2)
Basos: 0 %
EOS (ABSOLUTE): 0.1 10*3/uL (ref 0.0–0.4)
Eos: 1 %
Hematocrit: 38.2 % (ref 34.0–46.6)
Hemoglobin: 13.2 g/dL (ref 11.1–15.9)
Immature Grans (Abs): 0 10*3/uL (ref 0.0–0.1)
Immature Granulocytes: 0 %
Lymphocytes Absolute: 2 10*3/uL (ref 0.7–3.1)
Lymphs: 28 %
MCH: 31.8 pg (ref 26.6–33.0)
MCHC: 34.6 g/dL (ref 31.5–35.7)
MCV: 92 fL (ref 79–97)
Monocytes Absolute: 0.5 10*3/uL (ref 0.1–0.9)
Monocytes: 7 %
Neutrophils Absolute: 4.4 10*3/uL (ref 1.4–7.0)
Neutrophils: 64 %
Platelets: 363 10*3/uL (ref 150–450)
RBC: 4.15 x10E6/uL (ref 3.77–5.28)
RDW: 11.2 % — ABNORMAL LOW (ref 11.7–15.4)
WBC: 6.9 10*3/uL (ref 3.4–10.8)

## 2022-12-02 LAB — CMP14+EGFR
ALT: 47 IU/L — ABNORMAL HIGH (ref 0–32)
AST: 51 IU/L — ABNORMAL HIGH (ref 0–40)
Albumin/Globulin Ratio: 1.5 (ref 1.2–2.2)
Albumin: 4.7 g/dL (ref 3.9–4.9)
Alkaline Phosphatase: 107 IU/L (ref 44–121)
BUN/Creatinine Ratio: 8 — ABNORMAL LOW (ref 12–28)
BUN: 9 mg/dL (ref 8–27)
Bilirubin Total: 1 mg/dL (ref 0.0–1.2)
CO2: 25 mmol/L (ref 20–29)
Calcium: 10.2 mg/dL (ref 8.7–10.3)
Chloride: 97 mmol/L (ref 96–106)
Creatinine, Ser: 1.06 mg/dL — ABNORMAL HIGH (ref 0.57–1.00)
Globulin, Total: 3.1 g/dL (ref 1.5–4.5)
Glucose: 207 mg/dL — ABNORMAL HIGH (ref 70–99)
Potassium: 3.8 mmol/L (ref 3.5–5.2)
Sodium: 139 mmol/L (ref 134–144)
Total Protein: 7.8 g/dL (ref 6.0–8.5)
eGFR: 60 mL/min/{1.73_m2} (ref >59)

## 2022-12-02 LAB — HEMOGLOBIN A1C
Est. average glucose Bld gHb Est-mCnc: 197 mg/dL
Hgb A1c MFr Bld: 8.5 % — ABNORMAL HIGH (ref 4.8–5.6)

## 2022-12-02 LAB — TSH+FREE T4
Free T4: 1.41 ng/dL (ref 0.82–1.77)
TSH: 1.95 u[IU]/mL (ref 0.450–4.500)

## 2022-12-02 LAB — VITAMIN D 25 HYDROXY (VIT D DEFICIENCY, FRACTURES): Vit D, 25-Hydroxy: 39.2 ng/mL (ref 30.0–100.0)

## 2022-12-07 ENCOUNTER — Ambulatory Visit
Admission: RE | Admit: 2022-12-07 | Discharge: 2022-12-07 | Disposition: A | Discharge status: Home / Self Care | Payer: No Typology Code available for payment source | Source: Ambulatory Visit | Attending: Internal Medicine

## 2022-12-07 ENCOUNTER — Ambulatory Visit
Admission: RE | Admit: 2022-12-07 | Discharge: 2022-12-07 | Disposition: A | Discharge status: Home / Self Care | Payer: No Typology Code available for payment source | Source: Ambulatory Visit | Attending: Internal Medicine | Admitting: Internal Medicine

## 2022-12-07 DIAGNOSIS — R928 Other abnormal and inconclusive findings on diagnostic imaging of breast: Secondary | ICD-10-CM

## 2022-12-07 DIAGNOSIS — R921 Mammographic calcification found on diagnostic imaging of breast: Secondary | ICD-10-CM | POA: Diagnosis not present

## 2022-12-07 DIAGNOSIS — N6011 Diffuse cystic mastopathy of right breast: Secondary | ICD-10-CM | POA: Diagnosis not present

## 2022-12-07 HISTORY — PX: BREAST BIOPSY: SHX20

## 2022-12-08 ENCOUNTER — Other Ambulatory Visit: Payer: Self-pay | Admitting: Family Medicine

## 2022-12-08 DIAGNOSIS — I10 Essential (primary) hypertension: Secondary | ICD-10-CM

## 2022-12-12 ENCOUNTER — Ambulatory Visit
Admission: EM | Admit: 2022-12-12 | Discharge: 2022-12-12 | Disposition: A | Discharge status: Home / Self Care | Payer: No Typology Code available for payment source | Attending: Nurse Practitioner | Admitting: Nurse Practitioner

## 2022-12-12 ENCOUNTER — Ambulatory Visit (INDEPENDENT_AMBULATORY_CARE_PROVIDER_SITE_OTHER): Payer: No Typology Code available for payment source

## 2022-12-12 DIAGNOSIS — R062 Wheezing: Secondary | ICD-10-CM

## 2022-12-12 DIAGNOSIS — Z1152 Encounter for screening for COVID-19: Secondary | ICD-10-CM | POA: Diagnosis not present

## 2022-12-12 DIAGNOSIS — J069 Acute upper respiratory infection, unspecified: Secondary | ICD-10-CM | POA: Diagnosis not present

## 2022-12-12 DIAGNOSIS — R059 Cough, unspecified: Secondary | ICD-10-CM | POA: Diagnosis not present

## 2022-12-12 MED ORDER — BENZONATATE 100 MG PO CAPS: 100.0000 mg | ORAL_CAPSULE | Freq: Three times a day (TID) | ORAL | 0 refills | Status: DC | PRN

## 2022-12-12 MED ORDER — PREDNISONE 20 MG PO TABS: 40.0000 mg | ORAL_TABLET | Freq: Every day | ORAL | 0 refills | Status: AC

## 2022-12-12 MED ORDER — IPRATROPIUM-ALBUTEROL 0.5-2.5 (3) MG/3ML IN SOLN
3.0000 mL | Freq: Once | RESPIRATORY_TRACT | Status: AC
  Administered 2022-12-12: 3 mL via RESPIRATORY_TRACT

## 2022-12-12 MED ORDER — ALBUTEROL SULFATE HFA 108 (90 BASE) MCG/ACT IN AERS: 1.0000 | INHALATION_SPRAY | Freq: Four times a day (QID) | RESPIRATORY_TRACT | 0 refills | Status: AC | PRN

## 2022-12-12 NOTE — ED Triage Notes (Signed)
Cough, congestion, wheezing, sore throat and chest pain from cough, body aches, chills that started 2 days ago. Taking mucinx, and OTC cough syrup. Took home covid test and it was negative.

## 2022-12-12 NOTE — ED Provider Notes (Signed)
[facility]    CSN: [id number] Arrival date & time: 12/12/22  0935      History   Chief Complaint Chief Complaint  Patient presents with   Cough    HPI Laura Scott is a 62 y.o. female.   Patient presents today with 2-day history of tactile fevers, body aches and chills, congested cough, shortness of breath, pain in her ribs when she coughs, runny and stuffy nose, headache, ear pain, decreased appetite, loss of taste, and fatigue.  She denies chest pain or tightness, chest congestion, sore throat, abdominal pain, nausea/vomiting, diarrhea, and new rash.  Reports her brother has had similar symptoms recently was diagnosed with a sinus infection.  Has been taking over-the-counter medications including Mucinex DM without much benefit.  Patient reports history of bronchitis.  She denies history of asthma, also denies smoking or vaping history.  Reports she took an at home COVID test that was negative a couple of days ago.     Past Medical History:  Diagnosis Date   Allergic rhinitis    Anxiety    Chronic pain    Depression    Diabetes mellitus without complication    GERD (gastroesophageal reflux disease)    Hyperlipidemia    Hypertension    IBS (irritable bowel syndrome)    PMS (premenstrual syndrome)    Reflux     Patient Active Problem List   Diagnosis Date Noted   GERD (gastroesophageal reflux disease) 11/29/2022   Foot pain, left 11/29/2022   Elevated liver enzymes 08/19/2022   Overweight (BMI 25.0-29.9) 04/26/2022   Annual physical exam 11/13/2021   Need for immunization against influenza 11/13/2021   Positive ANA (antinuclear antibody) 09/14/2021   Arthralgia of right acromioclavicular joint 09/14/2021   Collar bone pain 09/14/2021   Allergies 09/14/2021   Vitamin D deficiency 09/14/2021   Early satiety 07/30/2021   Abdominal pain, epigastric 07/30/2021   Abnormal LFTs 07/30/2021   Diarrhea 07/30/2021   Acute bacterial rhinosinusitis  06/25/2020   Cough 06/25/2020   Mixed hyperlipidemia 09/27/2019   Type 2 diabetes mellitus with hyperglycemia, without long-term current use of insulin 01/08/2018   Anxiety and depression 05/17/2017   Prediabetes 10/26/2016   LUQ pain 09/28/2016   Rectal bleeding 09/28/2016   Constipation 09/28/2016   FHx: colonic polyps 09/28/2016   Dysphagia 09/28/2016   Anxiety 05/11/2015   Clinical depression 05/11/2015   Essential hypertension, benign 05/11/2015   Reflux 05/11/2015   Lumbar spondylosis 03/26/2014   Impaired fasting glucose 10/29/2013   Arthritis 10/29/2013   Chronic back pain 04/11/2013   Insomnia 04/11/2013    Past Surgical History:  Procedure Laterality Date   BREAST BIOPSY Right 12/07/2022   MM RT BREAST BX W LOC DEV 1ST LESION IMAGE BX SPEC STEREO GUIDE 12/07/2022 [facility]   COLONOSCOPY  08/29/2011   [name]: normal. high risk screening tcs in 5 years.    COLONOSCOPY WITH PROPOFOL N/A 10/20/2016   Procedure: COLONOSCOPY WITH PROPOFOL;  Surgeon: Corbin Ade, MD;  Location: [facility];  Service: Endoscopy;  Laterality: N/A;  10:45am   COLONOSCOPY WITH PROPOFOL N/A 02/09/2022   Procedure: COLONOSCOPY WITH PROPOFOL;  Surgeon: Corbin Ade, MD;  Location: [facility];  Service: Endoscopy;  Laterality: N/A;  2:30 / ASA 2   ESOPHAGOGASTRODUODENOSCOPY (EGD) WITH PROPOFOL N/A 10/20/2016   Procedure: ESOPHAGOGASTRODUODENOSCOPY (EGD) WITH PROPOFOL;  Surgeon: Corbin Ade, MD;  Location: [facility];  Service: Endoscopy;  Laterality: N/A;   LUMBAR DISC  SURGERY     MALONEY DILATION N/A 10/20/2016   Procedure: Elease Hashimoto DILATION;  Surgeon: Corbin Ade, MD;  Location: [facility];  Service: Endoscopy;  Laterality: N/A;   POLYPECTOMY  10/20/2016   Procedure: POLYPECTOMY;  Surgeon: Corbin Ade, MD;  Location: [facility];  Service: Endoscopy;;  cecal and sigmoid polypectomies    OB History   No obstetric history on file.      Home Medications    Prior to  Admission medications   Medication Sig Start Date End Date Taking? Authorizing Provider  albuterol (VENTOLIN HFA) 108 (90 Base) MCG/ACT inhaler Inhale 1-2 puffs into the lungs every 6 (six) hours as needed for wheezing or shortness of breath. 12/12/22  Yes Cathlean Marseilles A, NP  atorvastatin (LIPITOR) 20 MG tablet Take 1 tablet (20 mg total) by mouth at bedtime. 08/26/22  Yes Gilmore Laroche, FNP  benzonatate (TESSALON) 100 MG capsule Take 1 capsule (100 mg total) by mouth 3 (three) times daily as needed for cough. Do not take with alcohol or while driving or operating heavy machinery.  May cause drowsiness. 12/12/22  Yes Valentino Nose, NP  budesonide-formoterol (SYMBICORT) 160-4.5 MCG/ACT inhaler Inhale 2 puffs into the lungs 2 (two) times daily. Patient taking differently: Inhale 2 puffs into the lungs daily as needed (Shortness of breath). 09/20/19  Yes Merlyn Albert, MD  Cholecalciferol (VITAMIN D) 125 MCG (5000 UT) CAPS Take 5,000 Units by mouth daily with breakfast. 08/26/22  Yes Gilmore Laroche, FNP  diclofenac Sodium (VOLTAREN) 1 % GEL Apply 2-4 grams to affected joint 4 times daily as needed. 01/19/22  Yes Gearldine Bienenstock, PA-C  escitalopram (LEXAPRO) 20 MG tablet Take 1 tablet (20 mg total) by mouth daily. 08/26/22  Yes Gilmore Laroche, FNP  fluticasone (FLONASE) 50 MCG/ACT nasal spray Place 2 sprays into both nostrils daily. Patient taking differently: Place 2 sprays into both nostrils daily as needed for allergies. 04/01/19  Yes Merlyn Albert, MD  hydrochlorothiazide (HYDRODIURIL) 25 MG tablet TAKE 1 TABLET BY MOUTH ONCE DAILY AS NEEDED 08/26/22  Yes Gilmore Laroche, FNP  hydrOXYzine (VISTARIL) 50 MG capsule Take 1 capsule (50 mg total) by mouth at bedtime as needed. 11/29/22  Yes Gilmore Laroche, FNP  losartan (COZAAR) 50 MG tablet Take 1 tablet (50 mg total) by mouth daily. 08/26/22  Yes Gilmore Laroche, FNP  Omega-3 Fatty Acids (FISH OIL OMEGA-3) 1000 MG CAPS Take 1 g by mouth daily.  08/26/22  Yes Gilmore Laroche, FNP  omeprazole (PRILOSEC) 40 MG capsule TAKE 1 CAPSULE BY MOUTH DAILY. 12/08/22  Yes Gilmore Laroche, FNP  predniSONE (DELTASONE) 20 MG tablet Take 2 tablets (40 mg total) by mouth daily with breakfast for 5 days. 12/12/22 12/17/22 Yes Valentino Nose, NP  Probiotic Product (PROBIOTIC DAILY PO) Take by mouth.   Yes [provider]  tiZANidine (ZANAFLEX) 4 MG tablet Take 1 tablet (4 mg total) by mouth every 6 (six) hours as needed for muscle spasms. 10/13/22  Yes Gilmore Laroche, FNP  busPIRone (BUSPAR) 5 MG tablet TAKE (1) TABLET BY MOUTH (3) TIMES DAILY AS NEEDED FOR ANXIETY 11/12/21   Paseda, Baird Kay, FNP  Cyanocobalamin (VITAMIN B-12) 5000 MCG SUBL Place 5,000 mcg under the tongue daily.    [provider]  levocetirizine (XYZAL) 5 MG tablet Take 1 tablet (5 mg total) by mouth every evening. 10/28/21   Wallis Bamberg, PA-C    Family History Family History  Problem Relation Age of Onset  Hypertension Mother    Colon polyps Mother    Hypertension Father    Diabetes Father    Hypertension Sister    Hyperlipidemia Sister    Colon polyps Brother    Arthritis Brother    Diabetes Maternal Aunt    Hypertension Maternal Aunt    Rheum arthritis Maternal Grandmother    Prostate cancer Maternal Grandfather    Cancer Paternal Grandfather        not sure of type of cancer   Breast cancer Neg Hx    Cervical cancer Neg Hx     Social History Social History   Tobacco Use   Smoking status: Never    Passive exposure: Current   Smokeless tobacco: Never  Vaping Use   Vaping Use: Never used  Substance Use Topics   Alcohol use: Yes    Comment: occ   Drug use: No     Allergies   Sulfa antibiotics, Other, and Penicillins   Review of Systems Review of Systems Per HPI  Physical Exam Triage Vital Signs ED Triage Vitals [12/12/22 1030]  Enc Vitals Group     BP (!) 166/79     Pulse Rate 93     Resp 19     Temp 99.4 F (37.4 C)      Temp Source Oral     SpO2 95 %     Weight      Height      Head Circumference      Peak Flow      Pain Score 7     Pain Loc      Pain Edu?      Excl. in GC?    No data found.  Updated Vital Signs BP (!) 177/79 (BP Location: Right Arm)   Pulse 79   Temp 99.4 F (37.4 C) (Oral)   Resp 18   LMP 09/03/2013   SpO2 98%   Visual Acuity Right Eye Distance:   Left Eye Distance:   Bilateral Distance:    Right Eye Near:   Left Eye Near:    Bilateral Near:     Physical Exam Vitals and nursing note reviewed.  Constitutional:      General: She is not in acute distress.    Appearance: Normal appearance. She is not ill-appearing or toxic-appearing.  HENT:     Head: Normocephalic and atraumatic.     Right Ear: Tympanic membrane, ear canal and external ear normal.     Left Ear: Tympanic membrane, ear canal and external ear normal.     Nose: No congestion or rhinorrhea.     Mouth/Throat:     Mouth: Mucous membranes are moist.     Pharynx: Oropharynx is clear. Posterior oropharyngeal erythema present. No oropharyngeal exudate.  Eyes:     General: No scleral icterus.    Extraocular Movements: Extraocular movements intact.  Cardiovascular:     Rate and Rhythm: Normal rate and regular rhythm.  Pulmonary:     Effort: Pulmonary effort is normal. No respiratory distress.     Breath sounds: Transmitted upper airway sounds present. Wheezing present. No rhonchi or rales.  Musculoskeletal:     Cervical back: Normal range of motion and neck supple.  Lymphadenopathy:     Cervical: Cervical adenopathy present.  Skin:    General: Skin is warm and dry.     Coloration: Skin is not jaundiced or pale.     Findings: No erythema or rash.  Neurological:     Mental  Status: She is alert and oriented to person, place, and time.  Psychiatric:        Behavior: Behavior is cooperative.      UC Treatments / Results  Labs (all labs ordered are listed, but only abnormal results are  displayed) Labs Reviewed  SARS CORONAVIRUS 2 (TAT 6-24 HRS)    EKG   Radiology DG Chest 2 View  Result Date: 12/12/2022 CLINICAL DATA:  Cough congestion EXAM: CHEST - 2 VIEW COMPARISON:  None Available. FINDINGS: The heart size and mediastinal contours are within normal limits. Both lungs are clear. Disc degenerative disease of the thoracic spine. IMPRESSION: No acute abnormality of the lungs. Electronically Signed   By: Jearld Lesch M.D.   On: 12/12/2022 11:27    Procedures Procedures (including critical care time)  Medications Ordered in UC Medications  ipratropium-albuterol (DUONEB) 0.5-2.5 (3) MG/3ML nebulizer solution 3 mL (3 mLs Nebulization Given 12/12/22 1122)    Initial Impression / Assessment and Plan / UC Course  I have reviewed the triage vital signs and the nursing notes.  Pertinent labs & imaging results that were available during my care of the patient were reviewed by me and considered in my medical decision making (see chart for details).   Patient is well-appearing, afebrile, not tachycardic, not tachypneic, oxygenating well on room air.  Patiently is mildly hypertensive today in triage.  Hypertension slightly worse after breathing treatment-patient is asymptomatic.  1. Viral URI with cough 2. Wheezing 3. Encounter for screening for COVID-19 Suspect viral etiology Vital signs and examination today are reassuring COVID-19 test is pending Patient is a candidate for molnupiravir if she test positive DuoNeb breathing treatment given with improvement in chest tightness, oxygenation increased to 98% on room air after breathing treatment and patient reported subjective improvement in symptoms Start albuterol inhaler at home every 4-6 hours as needed for wheezing shortness of breath Start oral prednisone Tessalon Perles sent to pharmacy Other supportive care discussed Strict ER and return precautions discussed with patient  The patient was given the opportunity to  ask questions.  All questions answered to their satisfaction.  The patient is in agreement to this plan.    Final Clinical Impressions(s) / UC Diagnoses   Final diagnoses:  Viral URI with cough  Wheezing  Encounter for screening for COVID-19     Discharge Instructions      The chest x-ray today looks great - no pneumonia.    We gave you a breathing treatment today that helped open up your lungs.  Continue to use the albuterol inhaler at home every 4-6 hours as needed for wheezing or shortness of breath.  Please start the oral prednisone to help with lung inflammation.  You have a viral upper respiratory infection.  Symptoms should improve over the next week to 10 days.  If you develop chest pain or shortness of breath, go to the emergency room.  We have tested you today for COVID-19.  You will see the results in Mychart and we will call you with positive results.  Please stay home and isolate until you are aware of the results.    Some things that can make you feel better are: - Increased rest - Increasing fluid with water/sugar free electrolytes - Acetaminophen and ibuprofen as needed for fever/pain - Salt water gargling, chloraseptic spray and throat lozenges - OTC guaifenesin (Mucinex) 600 mg twice daily - Saline sinus flushes or a neti pot - Humidifying the air -Tessalon Perles during the day as  needed for dry cough      ED Prescriptions     Medication Sig Dispense Auth. Provider   albuterol (VENTOLIN HFA) 108 (90 Base) MCG/ACT inhaler Inhale 1-2 puffs into the lungs every 6 (six) hours as needed for wheezing or shortness of breath. 18 g Cathlean Marseilles A, NP   predniSONE (DELTASONE) 20 MG tablet Take 2 tablets (40 mg total) by mouth daily with breakfast for 5 days. 10 tablet Cathlean Marseilles A, NP   benzonatate (TESSALON) 100 MG capsule Take 1 capsule (100 mg total) by mouth 3 (three) times daily as needed for cough. Do not take with alcohol or while driving or  operating heavy machinery.  May cause drowsiness. 21 capsule Valentino Nose, NP      PDMP not reviewed this encounter.   Valentino Nose, NP 12/12/22 1201

## 2022-12-12 NOTE — Discharge Instructions (Addendum)
The chest x-ray today looks great - no pneumonia.    We gave you a breathing treatment today that helped open up your lungs.  Continue to use the albuterol inhaler at home every 4-6 hours as needed for wheezing or shortness of breath.  Please start the oral prednisone to help with lung inflammation.  You have a viral upper respiratory infection.  Symptoms should improve over the next week to 10 days.  If you develop chest pain or shortness of breath, go to the emergency room.  We have tested you today for COVID-19.  You will see the results in Mychart and we will call you with positive results.  Please stay home and isolate until you are aware of the results.    Some things that can make you feel better are: - Increased rest - Increasing fluid with water/sugar free electrolytes - Acetaminophen and ibuprofen as needed for fever/pain - Salt water gargling, chloraseptic spray and throat lozenges - OTC guaifenesin (Mucinex) 600 mg twice daily - Saline sinus flushes or a neti pot - Humidifying the air -Tessalon Perles during the day as needed for dry cough

## 2022-12-13 ENCOUNTER — Other Ambulatory Visit: Payer: Self-pay | Admitting: Family Medicine

## 2022-12-13 ENCOUNTER — Other Ambulatory Visit: Payer: Self-pay

## 2022-12-13 DIAGNOSIS — M62838 Other muscle spasm: Secondary | ICD-10-CM

## 2022-12-13 DIAGNOSIS — R7989 Other specified abnormal findings of blood chemistry: Secondary | ICD-10-CM

## 2022-12-13 LAB — SARS CORONAVIRUS 2 (TAT 6-24 HRS): SARS Coronavirus 2: NEGATIVE

## 2022-12-14 ENCOUNTER — Other Ambulatory Visit: Payer: Self-pay | Admitting: Family Medicine

## 2022-12-14 DIAGNOSIS — E7849 Other hyperlipidemia: Secondary | ICD-10-CM

## 2022-12-14 DIAGNOSIS — E1165 Type 2 diabetes mellitus with hyperglycemia: Secondary | ICD-10-CM

## 2022-12-14 MED ORDER — METFORMIN HCL 500 MG PO TABS
500.0000 mg | ORAL_TABLET | Freq: Every day | ORAL | 3 refills | Status: DC
Start: 2022-12-14 — End: 2023-02-20

## 2022-12-14 MED ORDER — ATORVASTATIN CALCIUM 40 MG PO TABS
40.0000 mg | ORAL_TABLET | Freq: Every day | ORAL | 3 refills | Status: DC
Start: 2022-12-14 — End: 2023-07-31

## 2022-12-14 MED ORDER — TIRZEPATIDE 2.5 MG/0.5ML ~~LOC~~ SOAJ
2.5000 mg | SUBCUTANEOUS | 0 refills | Status: DC
Start: 2022-12-14 — End: 2023-01-13

## 2022-12-22 ENCOUNTER — Other Ambulatory Visit (HOSPITAL_COMMUNITY)
Admission: RE | Admit: 2022-12-22 | Discharge: 2022-12-22 | Disposition: A | Discharge status: Home / Self Care | Payer: No Typology Code available for payment source | Source: Ambulatory Visit | Attending: Internal Medicine | Admitting: Internal Medicine

## 2022-12-22 DIAGNOSIS — R7989 Other specified abnormal findings of blood chemistry: Secondary | ICD-10-CM | POA: Insufficient documentation

## 2022-12-22 LAB — HEPATIC FUNCTION PANEL
ALT: 57 U/L — ABNORMAL HIGH (ref 0–44)
AST: 39 U/L (ref 15–41)
Albumin: 4.3 g/dL (ref 3.5–5.0)
Alkaline Phosphatase: 77 U/L (ref 38–126)
Bilirubin, Direct: 0.2 mg/dL (ref 0.0–0.2)
Indirect Bilirubin: 1.1 mg/dL — ABNORMAL HIGH (ref 0.3–0.9)
Total Bilirubin: 1.3 mg/dL — ABNORMAL HIGH (ref 0.3–1.2)
Total Protein: 7.7 g/dL (ref 6.5–8.1)

## 2022-12-22 LAB — FERRITIN: Ferritin: 426 ng/mL — ABNORMAL HIGH (ref 11–307)

## 2022-12-25 NOTE — Progress Notes (Signed)
GI Office Note    Referring Provider: Gilmore Laroche, FNP Primary Care Physician:  Gilmore Laroche, FNP  Primary Gastroenterologist: Roetta Sessions, MD   Chief Complaint   Chief Complaint  Patient presents with   Follow-up    History of Present Illness   Laura Scott is a 62 y.o. female presenting today for follow up. Last seen 09/2022. H/o elevated AST/ALT and ferritin.   Viral markers negative for chronic viral hepatitis;  ANA positive. Smooth muscle Ab neg, AMA negative. IgA elevated with normal IgG/IgM. Found to have elevated light chains. Celiac screen negative. Ferritin elevated in the 500s, iron sats normal. HFE genetic markers negative.   Referred to hematology for elevated free light chains. Patient missed appt in 09/2022, states she was unaware.     Today: Overall her weight is down about 10 pounds since February 2024. Started back on metformin and Mounjaro as well. Has taken two shots so far. She has had no etoh in 2 months. In the past she was drinking two 12 ounces beers daily, sometimes on the weekend, also drinking couple of shots. If on vacation, she would drink a bit more. Reports in the past some of her blood draws have been after an out of town weekend, drinking more than usual.  She had a sinus infection last week. She thought she may have covid at first. Her brother was sick first and then three others got sick. She went to urgent care, got steroid dose pack. Feeling better now. Around time of her illness, she had labs for [place], LFTs, ferritin. Her ferritin went from 200s to 400s, possibly due to her acute illness. Still struggles with BMs. Now having more constipation. May be due to change in diet, trying to lose weight. Also now on Mounjaro. No melena, brbpr.  Labs 12/22/22: AST 39, ALT 57, Tbili 1.3, AP 77, ferritin 426 up from 260.   Component     Latest Ref Rng 09/02/2021 03/29/2022 07/26/2022  Total Bilirubin     0.3 - 1.2 mg/dL 0.6  0.8  0.9   Total  Bilirubin        0.8   Alkaline Phosphatase     38 - 126 U/L 105  104  94   Alkaline Phosphatase        97   AST     15 - 41 U/L 34  35  48 (H)   AST        49 (H)   ALT     0 - 44 U/L 29  34 (H)  39 (H)   ALT        40 (H)    Component     Latest Ref Rng 10/07/2022 12/01/2022 12/22/2022  Total Bilirubin     0.3 - 1.2 mg/dL 0.9  1.0  1.3 (H)   Alkaline Phosphatase     38 - 126 U/L 86  107  77   AST     15 - 41 U/L 56 (H)  51 (H)  39   ALT     0 - 44 U/L 52 (H)  47 (H)  57 (H)      Abd u/s with elastography 09/2022: -fatty liver -median kPa of 7.3  Colonoscopy 01/2022: Diverticulosis, internal hemorrhoids. Next colonoscopy 7 years.  EGD March 2018: LA grade a esophagitis, duodenal erosions, small hiatal hernia   Medications   Current Outpatient Medications  Medication Sig Dispense Refill   albuterol (VENTOLIN  HFA) 108 (90 Base) MCG/ACT inhaler Inhale 1-2 puffs into the lungs every 6 (six) hours as needed for wheezing or shortness of breath. 18 g 0   atorvastatin (LIPITOR) 40 MG tablet Take 1 tablet (40 mg total) by mouth daily. 90 tablet 3   benzonatate (TESSALON) 100 MG capsule Take 1 capsule (100 mg total) by mouth 3 (three) times daily as needed for cough. Do not take with alcohol or while driving or operating heavy machinery.  May cause drowsiness. 21 capsule 0   budesonide-formoterol (SYMBICORT) 160-4.5 MCG/ACT inhaler Inhale 2 puffs into the lungs 2 (two) times daily. (Patient taking differently: Inhale 2 puffs into the lungs daily as needed (Shortness of breath).) 1 Inhaler 5   Cholecalciferol (VITAMIN D) 125 MCG (5000 UT) CAPS Take 5,000 Units by mouth daily with breakfast. 30 capsule 2   diclofenac Sodium (VOLTAREN) 1 % GEL Apply 2-4 grams to affected joint 4 times daily as needed. 400 g 2   escitalopram (LEXAPRO) 20 MG tablet Take 1 tablet (20 mg total) by mouth daily. 90 tablet 1   fluticasone (FLONASE) 50 MCG/ACT nasal spray Place 2 sprays into both nostrils daily.  (Patient taking differently: Place 2 sprays into both nostrils daily as needed for allergies.) 48 g 5   hydrochlorothiazide (HYDRODIURIL) 25 MG tablet TAKE 1 TABLET BY MOUTH ONCE DAILY AS NEEDED 90 tablet 1   hydrOXYzine (VISTARIL) 50 MG capsule Take 1 capsule (50 mg total) by mouth at bedtime as needed. 30 capsule 1   levocetirizine (XYZAL) 5 MG tablet Take 1 tablet (5 mg total) by mouth every evening. 90 tablet 0   losartan (COZAAR) 50 MG tablet Take 1 tablet (50 mg total) by mouth daily. 90 tablet 1   metFORMIN (GLUCOPHAGE) 500 MG tablet Take 1 tablet (500 mg total) by mouth daily. 180 tablet 3   Omega-3 Fatty Acids (FISH OIL OMEGA-3) 1000 MG CAPS Take 1 g by mouth daily. 90 capsule 1   omeprazole (PRILOSEC) 40 MG capsule TAKE 1 CAPSULE BY MOUTH DAILY. 90 capsule 0   Probiotic Product (PROBIOTIC DAILY PO) Take by mouth.     tirzepatide (MOUNJARO) 2.5 MG/0.5ML Pen Inject 2.5 mg into the skin once a week. 2 mL 0   tiZANidine (ZANAFLEX) 4 MG tablet TAKE (1) TABLET BY MOUTH EVERY SIX HOURS AS NEEDED FOR MUSCLE SPASMS. 30 tablet 0   No current facility-administered medications for this visit.    Allergies   Allergies as of 12/27/2022 - Review Complete 12/27/2022  Allergen Reaction Noted   Sulfa antibiotics Hives, Diarrhea, and Itching 07/20/2011   Other  07/20/2011   Penicillins Hives, Itching, and Rash 03/15/2017      Review of Systems   General: Negative for anorexia, weight loss, fever, chills, fatigue, weakness. ENT: Negative for hoarseness, difficulty swallowing , nasal congestion. CV: Negative for chest pain, angina, palpitations, dyspnea on exertion, peripheral edema.  Respiratory: Negative for dyspnea at rest, dyspnea on exertion, cough, sputum, wheezing.  GI: See history of present illness. GU:  Negative for dysuria, hematuria, urinary incontinence, urinary frequency, nocturnal urination.  Endo: Negative for unusual weight change.     Physical Exam   BP (!) 167/76 (BP  Location: Right Arm, Patient Position: Sitting, Cuff Size: Normal)   Pulse 62   Temp 98.4 F (36.9 C) (Oral)   Ht 5' 5.5" (1.664 m)   Wt 161 lb 6.4 oz (73.2 kg)   LMP 09/03/2013   SpO2 98%   BMI 26.45 kg/m  General: Well-nourished, well-developed in no acute distress.  Eyes: No icterus. Mouth: Oropharyngeal mucosa moist and pink Abdomen: Bowel sounds are normal, nontender, nondistended, no hepatosplenomegaly or masses,  no abdominal bruits or hernia , no rebound or guarding.  Rectal: Not performed Extremities: No lower extremity edema. No clubbing or deformities. Neuro: Alert and oriented x 4   Skin: Warm and dry, no jaundice.   Psych: Alert and cooperative, normal mood and affect.  Labs   Lab Results  Component Value Date   TSH 1.950 12/01/2022   Lab Results  Component Value Date   ALT 57 (H) 12/22/2022   AST 39 12/22/2022   ALKPHOS 77 12/22/2022   BILITOT 1.3 (H) 12/22/2022   Lab Results  Component Value Date   HGBA1C 8.5 (H) 12/01/2022   Lab Results  Component Value Date   WBC 6.9 12/01/2022   HGB 13.2 12/01/2022   HCT 38.2 12/01/2022   MCV 92 12/01/2022   PLT 363 12/01/2022   Lab Results  Component Value Date   WBC 6.9 12/01/2022   HGB 13.2 12/01/2022   HCT 38.2 12/01/2022   MCV 92 12/01/2022   PLT 363 12/01/2022    Imaging Studies   DG Chest 2 View  Result Date: 12/12/2022 CLINICAL DATA:  Cough congestion EXAM: CHEST - 2 VIEW COMPARISON:  None Available. FINDINGS: The heart size and mediastinal contours are within normal limits. Both lungs are clear. Disc degenerative disease of the thoracic spine. IMPRESSION: No acute abnormality of the lungs. Electronically Signed   By: Jearld Lesch M.D.   On: 12/12/2022 11:27   MM RT BREAST BX W LOC DEV 1ST LESION IMAGE BX SPEC STEREO GUIDE  Addendum Date: 12/09/2022   ADDENDUM REPORT: 12/09/2022 10:34 ADDENDUM: Pathology revealed Breast, RIGHT, needle core biopsy, upper outer, (X clip)- BENIGN BREAST  PARENCHYMA WITH USUAL DUCTAL HYPERPLASIA, FIBROCYSTIC CHANGE AND CALCIFICATIONS- NO MALIGNANCY IDENTIFIED. This was found to be concordant by Dr. Laveda Abbe. Pathology results were discussed with the patient by telephone. The patient reported doing well after the biopsy with tenderness at the site. Post biopsy instructions and care were reviewed and questions were answered. The patient was encouraged to call [facility] for any additional concerns. The patient was instructed to return for annual screening mammography when due. Pathology results reported by Collene Mares RN on 12/09/2022. Electronically Signed   By: Harmon Pier M.D.   On: 12/09/2022 10:34   Result Date: 12/09/2022 CLINICAL DATA:  62 year old female for tissue sampling of 0.5 cm group of UPPER-OUTER RIGHT breast calcifications. EXAM: RIGHT BREAST STEREOTACTIC CORE NEEDLE BIOPSY COMPARISON:  Previous exam(s). FINDINGS: The patient and I discussed the procedure of stereotactic-guided biopsy including benefits and alternatives. We discussed the high likelihood of a successful procedure. We discussed the risks of the procedure including infection, bleeding, tissue injury, clip migration, and inadequate sampling. Informed written consent was given. The usual time out protocol was performed immediately prior to the procedure. Using sterile technique and 1% Lidocaine with and without epinephrine as local anesthetic, under stereotactic guidance, a 9 gauge vacuum assisted device was used to perform core needle biopsy of the 0.5 cm group of UPPER-OUTER RIGHT breast calcifications using a SUPERIOR approach. Specimen radiograph was performed showing calcifications. Specimens with calcifications are identified for pathology. Lesion quadrant: UPPER-OUTER RIGHT breast At the conclusion of the procedure, an X shaped tissue marker clip was deployed into the biopsy cavity. Follow-up 2-view mammogram was performed and dictated separately.  IMPRESSION:  Stereotactic-guided biopsy of 0.5 cm group of UPPER-OUTER RIGHT breast calcifications. No apparent complications. Electronically Signed: By: Harmon Pier M.D. On: 12/07/2022 10:03  MM CLIP PLACEMENT RIGHT  Result Date: 12/07/2022 CLINICAL DATA:  Evaluate X biopsy clip placement following stereotactic guided RIGHT breast biopsy EXAM: 3D DIAGNOSTIC RIGHT MAMMOGRAM POST ULTRASOUND BIOPSY COMPARISON:  Previous exam(s). FINDINGS: 3D Mammographic images were obtained following RIGHT guided biopsy of 0.5 cm group of UPPER-OUTER RIGHT breast calcifications. The X biopsy marking clip is located 0.7 cm anterior to the remaining calcifications on the LATERAL view. IMPRESSION: 0.7 cm anterior clip migration from remaining calcifications in the UPPER-OUTER RIGHT breast. These remaining calcifications can be localized if surgical removal is required. Final Assessment: Post Procedure Mammograms for Marker Placement Electronically Signed   By: Harmon Pier M.D.   On: 12/07/2022 10:06   Assessment   Elevated AST/ALT/ferritin: Likely alcohol related fatty liver disease. Median kPa of 7 on elastography. Has had positive ANA, elevated IgA of unclear significance. Ferritin elevated but negative HFE genetic markers and possibly due to etoh use and recent increase likely due to acute illness. Will continue to monitor in next few months with ongoing etoh cessation.   Elevated free light chains: complete hematology referral.  Constipation: colonoscopy up to date. Likely exacerbated by change in diet and mounjaro.  PLAN   Continue weight loss efforts. Continue to refrain from alcohol use. Try to add in exercise routinely.  Start with 20 to 30 minutes of walking daily. Control sugars and cholesterol. Referral to hematology need to order. Labs in 3 months, LFTs, iron/tibc/ferritn. Start fiber supplement such as Benefiber, 4 to 5 g/day. Can add MiraLAX 1 capful daily if needed to help with regularity. Call with  any itching, swelling, abdominal pain. Recommend completing referral to hematology for elevated free light chains, elevated ferritin.    Leanna Battles. Melvyn Neth, MHS, PA-C [facility]

## 2022-12-27 ENCOUNTER — Encounter: Payer: Self-pay | Admitting: Gastroenterology

## 2022-12-27 ENCOUNTER — Encounter: Payer: Self-pay | Admitting: Family Medicine

## 2022-12-27 ENCOUNTER — Ambulatory Visit (INDEPENDENT_AMBULATORY_CARE_PROVIDER_SITE_OTHER): Payer: No Typology Code available for payment source | Admitting: Family Medicine

## 2022-12-27 ENCOUNTER — Ambulatory Visit (INDEPENDENT_AMBULATORY_CARE_PROVIDER_SITE_OTHER): Payer: No Typology Code available for payment source | Admitting: Gastroenterology

## 2022-12-27 VITALS — BP 167/76 | HR 62 | Temp 98.4°F | Ht 65.5 in | Wt 161.4 lb

## 2022-12-27 VITALS — BP 132/76 | HR 76 | Wt 161.4 lb

## 2022-12-27 DIAGNOSIS — R7989 Other specified abnormal findings of blood chemistry: Secondary | ICD-10-CM

## 2022-12-27 DIAGNOSIS — K5900 Constipation, unspecified: Secondary | ICD-10-CM | POA: Diagnosis not present

## 2022-12-27 DIAGNOSIS — R058 Other specified cough: Secondary | ICD-10-CM | POA: Diagnosis not present

## 2022-12-27 DIAGNOSIS — F32A Depression, unspecified: Secondary | ICD-10-CM | POA: Diagnosis not present

## 2022-12-27 DIAGNOSIS — F419 Anxiety disorder, unspecified: Secondary | ICD-10-CM | POA: Diagnosis not present

## 2022-12-27 DIAGNOSIS — Z7984 Long term (current) use of oral hypoglycemic drugs: Secondary | ICD-10-CM | POA: Diagnosis not present

## 2022-12-27 DIAGNOSIS — E1165 Type 2 diabetes mellitus with hyperglycemia: Secondary | ICD-10-CM | POA: Diagnosis not present

## 2022-12-27 MED ORDER — PROMETHAZINE-DM 6.25-15 MG/5ML PO SYRP
5.0000 mL | ORAL_SOLUTION | Freq: Four times a day (QID) | ORAL | 0 refills | Status: DC | PRN
Start: 2022-12-27 — End: 2023-03-29

## 2022-12-27 MED ORDER — ACCU-CHEK GUIDE VI STRP
ORAL_STRIP | 3 refills | Status: AC
Start: 2022-12-27 — End: ?

## 2022-12-27 NOTE — Assessment & Plan Note (Signed)
GAD-7 is 3 PHQ-9 is 7 She reports doing well on hydroxyzine 50 mg and Lexapro 20 mg daily Denies suicidal thoughts and ideation Encouraged to continue treatment regimen

## 2022-12-27 NOTE — Assessment & Plan Note (Signed)
Reports that she was recently treated for bronchitis with prednisone and Tessalon Perles Reports minimal relief of her symptoms with Jerilynn Som She complains of a nonproductive dry cough that is worse at nighttime No upper respiratory symptoms reported We will treat the patient's cough with promethazine DM

## 2022-12-27 NOTE — Patient Instructions (Addendum)
I appreciate the opportunity to provide care to you today!    Follow up:  3 months  Labs:next visit  Please pick up your prescription at the pharmacy and start therapy   Please continue to a heart-healthy diet and increase your physical activities. Try to exercise for at least five days a week.      It was a pleasure to see you and I look forward to continuing to work together on your health and well-being. Please do not hesitate to call the office if you need care or have questions about your care.   Have a wonderful day and week. With Gratitude, Gilmore Laroche MSN, FNP-BC

## 2022-12-27 NOTE — Patient Instructions (Addendum)
We will update your labs in 3 months. We will send you a reminder letter closer to the due date. Go to the hospital for labs. Continue your weight loss efforts.  Continue to refrain from alcohol use.  Try to add in exercise routinely. Start with 20-30 minutes of walking daily.  Control your sugars and cholesterol. Start a good fiber supplement. Recommend 4-5 grams of fiber per day. Avoid fiber pills/capsules. Use powder or chewables. You can add miralax one capful daily if still needed to help soften stool. Call if you have any abdominal pain, itching, swelling.

## 2022-12-27 NOTE — Progress Notes (Signed)
Established Patient Office Visit  Subjective:  Patient ID: Laura Scott, female    DOB: 05/02/1961  Age: 62 y.o. MRN: [id number]  CC:  Chief Complaint  Patient presents with   Chronic Care Management    4 week f/u, needs refills on lancets today.   Cough    Reports ongoing sx of cough, started off as sinus infection now can't get rid of cough sx started on 12/13/22    HPI Laura Scott is a 62 y.o. female presents for anxiety follow-up. For the details of today's visit, please refer to the assessment and plan.    Past Medical History:  Diagnosis Date   Allergic rhinitis    Anxiety    Chronic pain    Depression    Diabetes mellitus without complication (HCC)    GERD (gastroesophageal reflux disease)    Hyperlipidemia    Hypertension    IBS (irritable bowel syndrome)    PMS (premenstrual syndrome)    Reflux     Past Surgical History:  Procedure Laterality Date   BREAST BIOPSY Right 12/07/2022   MM RT BREAST BX W LOC DEV 1ST LESION IMAGE BX SPEC STEREO GUIDE 12/07/2022 [facility]   COLONOSCOPY  08/29/2011   [name]: normal. high risk screening tcs in 5 years.    COLONOSCOPY WITH PROPOFOL N/A 10/20/2016   Procedure: COLONOSCOPY WITH PROPOFOL;  Surgeon: Corbin Ade, MD;  [facility];  Service: Endoscopy;  Laterality: N/A;  10:45am   COLONOSCOPY WITH PROPOFOL N/A 02/09/2022   Procedure: COLONOSCOPY WITH PROPOFOL;  Surgeon: Corbin Ade, MD;  [facility]: [facility];  Service: Endoscopy;  Laterality: N/A;  2:30 / ASA 2   ESOPHAGOGASTRODUODENOSCOPY (EGD) WITH PROPOFOL N/A 10/20/2016   Procedure: ESOPHAGOGASTRODUODENOSCOPY (EGD) WITH PROPOFOL;  Surgeon: Corbin Ade, MD;  [facility];  Service: Endoscopy;  Laterality: N/A;   LUMBAR DISC SURGERY     MALONEY DILATION N/A 10/20/2016   Procedure: Elease Hashimoto DILATION;  Surgeon: Corbin Ade, MD;  [facility]: [facility];  Service: Endoscopy;  Laterality: N/A;   POLYPECTOMY  10/20/2016   Procedure:  POLYPECTOMY;  Surgeon: Corbin Ade, MD;  [facility];  Service: Endoscopy;;  cecal and sigmoid polypectomies    Family History  Problem Relation Age of Onset   Hypertension Mother    Colon polyps Mother    Hypertension Father    Diabetes Father    Hypertension Sister    Hyperlipidemia Sister    Colon polyps Brother    Arthritis Brother    Diabetes Maternal Aunt    Hypertension Maternal Aunt    Rheum arthritis Maternal Grandmother    Prostate cancer Maternal Grandfather    Cancer Paternal Grandfather        not sure of type of cancer   Breast cancer Neg Hx    Cervical cancer Neg Hx     Social History   Socioeconomic History   Marital status: Divorced    Spouse name: Not on file   Number of children: Not on file   Years of education: Not on file   Highest education level: Not on file  Occupational History   Not on file  Tobacco Use   Smoking status: Never    Passive exposure: Current   Smokeless tobacco: Never  Vaping Use   Vaping Use: Never used  Substance and Sexual Activity   Alcohol use: Yes    Comment: occ   Drug use: No  Sexual activity: Yes    Birth control/protection: None, Post-menopausal  Other Topics Concern   Not on file  Social History Narrative   Lives with her brother. Pt is on disability from her lumbar fusion.    Social Determinants of Health   Financial Resource Strain: Not on file  Food Insecurity: Not on file  Transportation Needs: Not on file  Physical Activity: Not on file  Stress: Not on file  Social Connections: Not on file  Intimate Partner Violence: Not on file    Outpatient Medications Prior to Visit  Medication Sig Dispense Refill   albuterol (VENTOLIN HFA) 108 (90 Base) MCG/ACT inhaler Inhale 1-2 puffs into the lungs every 6 (six) hours as needed for wheezing or shortness of breath. 18 g 0   atorvastatin (LIPITOR) 40 MG tablet Take 1 tablet (40 mg total) by mouth daily. 90 tablet 3   benzonatate (TESSALON) 100  MG capsule Take 1 capsule (100 mg total) by mouth 3 (three) times daily as needed for cough. Do not take with alcohol or while driving or operating heavy machinery.  May cause drowsiness. 21 capsule 0   budesonide-formoterol (SYMBICORT) 160-4.5 MCG/ACT inhaler Inhale 2 puffs into the lungs 2 (two) times daily. (Patient taking differently: Inhale 2 puffs into the lungs daily as needed (Shortness of breath).) 1 Inhaler 5   Cholecalciferol (VITAMIN D) 125 MCG (5000 UT) CAPS Take 5,000 Units by mouth daily with breakfast. 30 capsule 2   diclofenac Sodium (VOLTAREN) 1 % GEL Apply 2-4 grams to affected joint 4 times daily as needed. 400 g 2   escitalopram (LEXAPRO) 20 MG tablet Take 1 tablet (20 mg total) by mouth daily. 90 tablet 1   fluticasone (FLONASE) 50 MCG/ACT nasal spray Place 2 sprays into both nostrils daily. (Patient taking differently: Place 2 sprays into both nostrils daily as needed for allergies.) 48 g 5   hydrochlorothiazide (HYDRODIURIL) 25 MG tablet TAKE 1 TABLET BY MOUTH ONCE DAILY AS NEEDED 90 tablet 1   hydrOXYzine (VISTARIL) 50 MG capsule Take 1 capsule (50 mg total) by mouth at bedtime as needed. 30 capsule 1   levocetirizine (XYZAL) 5 MG tablet Take 1 tablet (5 mg total) by mouth every evening. 90 tablet 0   losartan (COZAAR) 50 MG tablet Take 1 tablet (50 mg total) by mouth daily. 90 tablet 1   metFORMIN (GLUCOPHAGE) 500 MG tablet Take 1 tablet (500 mg total) by mouth daily. 180 tablet 3   Omega-3 Fatty Acids (FISH OIL OMEGA-3) 1000 MG CAPS Take 1 g by mouth daily. 90 capsule 1   omeprazole (PRILOSEC) 40 MG capsule TAKE 1 CAPSULE BY MOUTH DAILY. 90 capsule 0   Probiotic Product (PROBIOTIC DAILY PO) Take by mouth.     tirzepatide (MOUNJARO) 2.5 MG/0.5ML Pen Inject 2.5 mg into the skin once a week. 2 mL 0   tiZANidine (ZANAFLEX) 4 MG tablet TAKE (1) TABLET BY MOUTH EVERY SIX HOURS AS NEEDED FOR MUSCLE SPASMS. 30 tablet 0   No facility-administered medications prior to visit.     Allergies  Allergen Reactions   Sulfa Antibiotics Hives, Diarrhea and Itching   Other     SURGICAL TAPE Reaction: rash    Penicillins Hives, Itching and Rash    Pt states that she is able to take PCN  Has patient had a PCN reaction causing immediate rash, facial/tongue/throat swelling, SOB or lightheadedness with hypotension: Yes Has patient had a PCN reaction causing severe rash involving mucus membranes or skin necrosis:  No Has patient had a PCN reaction that required hospitalization: No Has patient had a PCN reaction occurring within the last 10 years: Yes If all of the above answers are "NO", then may proceed with Cephalosporin use.    ROS Review of Systems  Constitutional:  Negative for chills and fever.  Eyes:  Negative for visual disturbance.  Respiratory:  Positive for cough. Negative for chest tightness and shortness of breath.   Neurological:  Negative for dizziness and headaches.      Objective:    Physical Exam HENT:     Head: Normocephalic.     Mouth/Throat:     Mouth: Mucous membranes are moist.  Cardiovascular:     Rate and Rhythm: Normal rate.     Heart sounds: Normal heart sounds.  Pulmonary:     Effort: Pulmonary effort is normal.     Breath sounds: Normal breath sounds.  Neurological:     Mental Status: She is alert.     BP 132/76   Pulse 76   Wt 161 lb 6.4 oz (73.2 kg)   LMP 09/03/2013   SpO2 97%   BMI 26.45 kg/m  Wt Readings from Last 3 Encounters:  12/27/22 161 lb 6.4 oz (73.2 kg)  12/27/22 161 lb 6.4 oz (73.2 kg)  11/29/22 167 lb 1.3 oz (75.8 kg)    Lab Results  Component Value Date   TSH 1.950 12/01/2022   Lab Results  Component Value Date   WBC 6.9 12/01/2022   HGB 13.2 12/01/2022   HCT 38.2 12/01/2022   MCV 92 12/01/2022   PLT 363 12/01/2022   Lab Results  Component Value Date   NA 139 12/01/2022   K 3.8 12/01/2022   CO2 25 12/01/2022   GLUCOSE 207 (H) 12/01/2022   BUN 9 12/01/2022   CREATININE 1.06 (H)  12/01/2022   BILITOT 1.3 (H) 12/22/2022   ALKPHOS 77 12/22/2022   AST 39 12/22/2022   ALT 57 (H) 12/22/2022   PROT 7.7 12/22/2022   ALBUMIN 4.3 12/22/2022   CALCIUM 10.2 12/01/2022   ANIONGAP 13 02/07/2022   EGFR 60 12/01/2022   Lab Results  Component Value Date   CHOL 172 12/01/2022   Lab Results  Component Value Date   HDL 40 12/01/2022   Lab Results  Component Value Date   LDLCALC 105 (H) 12/01/2022   Lab Results  Component Value Date   TRIG 151 (H) 12/01/2022   Lab Results  Component Value Date   CHOLHDL 4.3 12/01/2022   Lab Results  Component Value Date   HGBA1C 8.5 (H) 12/01/2022      Assessment & Plan:  Anxiety and depression Assessment & Plan: GAD-7 is 3 PHQ-9 is 7 She reports doing well on hydroxyzine 50 mg and Lexapro 20 mg daily Denies suicidal thoughts and ideation Encouraged to continue treatment regimen   Other cough Assessment & Plan: Reports that she was recently treated for bronchitis with prednisone and Tessalon Perles Reports minimal relief of her symptoms with Tessalon Perles She complains of a nonproductive dry cough that is worse at nighttime No upper respiratory symptoms reported We will treat the patient's cough with promethazine DM   Orders: -     Promethazine-DM; Take 5 mLs by mouth 4 (four) times daily as needed for cough.  Dispense: 118 mL; Refill: 0  Type 2 diabetes mellitus with hyperglycemia, without long-term current use of insulin (HCC) -     Accu-Chek Guide; CHECK BLOOD SUGAR ONCE  DAILY  Dispense: 100 strip; Refill: 3    Follow-up: Return in about 3 months (around 03/29/2023).   Gilmore Laroche, FNP

## 2023-01-08 ENCOUNTER — Telehealth: Payer: Self-pay | Admitting: Gastroenterology

## 2023-01-08 NOTE — Telephone Encounter (Signed)
Patient missed her hematology referral appt stating she was unaware of it. I referred her for elevated free light chains and she has elevated ferritin. Please refer again.

## 2023-01-09 ENCOUNTER — Other Ambulatory Visit: Payer: Self-pay | Admitting: *Deleted

## 2023-01-09 DIAGNOSIS — R7989 Other specified abnormal findings of blood chemistry: Secondary | ICD-10-CM

## 2023-01-09 NOTE — Telephone Encounter (Signed)
Referral sent 

## 2023-01-13 ENCOUNTER — Other Ambulatory Visit: Payer: Self-pay | Admitting: Family Medicine

## 2023-01-13 ENCOUNTER — Telehealth: Payer: Self-pay | Admitting: Family Medicine

## 2023-01-13 DIAGNOSIS — E1165 Type 2 diabetes mellitus with hyperglycemia: Secondary | ICD-10-CM

## 2023-01-13 MED ORDER — TIRZEPATIDE 5 MG/0.5ML ~~LOC~~ SOAJ
5.0000 mg | SUBCUTANEOUS | 0 refills | Status: DC
Start: 2023-01-13 — End: 2023-02-07

## 2023-01-13 NOTE — Telephone Encounter (Signed)
Patient came by the office and needs to make sure this will be sent in today before holiday  tirzepatide [facility]) 2.5 MG/0.5ML Pen [[id number]]    [facility]

## 2023-01-13 NOTE — Telephone Encounter (Signed)
Mounjaro 5mg  sent to her pharmacy

## 2023-01-26 NOTE — Progress Notes (Signed)
[facility] [address] [place] [address]   Clinic Day:  01/27/2023  Referring physician: Gilmore Laroche, FNP  Patient Care Team: Gilmore Laroche, FNP as PCP - General (Family Medicine) Doreatha Massed, MD as Medical Oncologist (Hematology)   ASSESSMENT & PLAN:   Assessment:  1.  Elevated ferritin and IgA levels: - Followed with GI for likely alcohol related to fatty liver disease - Elevated ferritin levels between 400-588 since January 2023 - Hemochromatosis mutation testing 03/2022: Negative - IgA levels elevated since 08/2021 [phone]) - US abdomen with elastography (10/07/2022): Diffuse fatty infiltration. - She Cooks in cast iron pans daily. - No fevers.  Occasional sweating in the head/legs but not drenching type.  Lost 20 pounds in the last 2 months after being on Mounjaro and now Ozempic.  No recurrent infections.  2.  Social/family history: - She is independent of ADLs and IADLs.  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  Reports exposure to chemicals.  She has been on disability since back surgery.  Non-smoker.  She used to drink beer daily until 3 months ago.  She now drinks occasional glass of wine or a mixed drink. - Mother had hepatitis.  Paternal grandfather had stomach cancer.  Plan:  1.  Elevated IgA levels: - Increased IgA levels are seen in inflammatory status, hepatitis, cirrhosis, IgA myeloma and several autoimmune disorders. - She had myeloma workup in January which was negative. - Will repeat serum free light chains as kappa light chains were elevated with a normal ratio. - Will check 24-hour urine for total protein, UPEP, urine free light chains and immunofixation.  2.  Elevated ferritin levels: - Hemochromatosis workup was negative in August 2023. - Mildly elevated ferritin levels can be seen with fatty liver disease.  Her saturation was less than 50% indicating most likely benign etiology. - Will  repeat ferritin and iron panel with ESR and CRP today.  Orders Placed This Encounter  Procedures   Comprehensive metabolic panel    Standing Status:   Future    Number of Occurrences:   1    Standing Expiration Date:   01/27/2024   Ferritin    Standing Status:   Future    Number of Occurrences:   1    Standing Expiration Date:   01/27/2024   Iron and TIBC ([facility] ONLY)    Standing Status:   Future    Number of Occurrences:   1    Standing Expiration Date:   01/27/2024   Immunoglobulins, QN, A/E/G/M    Standing Status:   Future    Number of Occurrences:   1    Standing Expiration Date:   01/27/2024   ANA, IFA (with reflex)    Standing Status:   Future    Number of Occurrences:   1    Standing Expiration Date:   01/27/2024   Rheumatoid factor    Standing Status:   Future    Number of Occurrences:   1    Standing Expiration Date:   01/27/2024   Sedimentation rate    Standing Status:   Future    Number of Occurrences:   1    Standing Expiration Date:   01/27/2024   C-reactive protein    Standing Status:   Future    Number of Occurrences:   1    Standing Expiration Date:   01/27/2024   Kappa/lambda light chains  Standing Status:   Future    Number of Occurrences:   1    Standing Expiration Date:   01/27/2024   24 hr Ur UPEP/UIFE/Light Chains/TP    Standing Status:   Future    Standing Expiration Date:   01/27/2024      I,Katie Daubenspeck,acting as a scribe for Doreatha Massed, MD.,have documented all relevant documentation on the behalf of Doreatha Massed, MD,as directed by  Doreatha Massed, MD while in the presence of Doreatha Massed, MD.   I, Doreatha Massed MD, have reviewed the above documentation for accuracy and completeness, and I agree with the above.   Doreatha Massed, MD   6/7/20242:02 PM  CHIEF COMPLAINT/PURPOSE OF CONSULT:   Diagnosis: elevated ferritin  Current Therapy: Under workup  HISTORY OF PRESENT ILLNESS:    Alinah is a 62 y.o. female presenting to clinic today for evaluation of elevated ferritin at the request of Tana Coast, PA-C.  She was previously referred to me in 08/2022, but she did not show to her appointment. She was found to have elevated IgA serum, which has been present since at least 08/2021 per chart review.  More recently, she was found to have abnormal ferritin from 08/2021 when it was 517. Aside from one instance when it was down to 260 in 09/2022, her ferritin has been above 426.  Today, she states that she is doing well overall. Her appetite level is at 40%. Her energy level is at 20%.  PAST MEDICAL HISTORY:   Past Medical History: Past Medical History:  Diagnosis Date   Allergic rhinitis    Anxiety    Chronic pain    Depression    Diabetes mellitus without complication (HCC)    GERD (gastroesophageal reflux disease)    Hyperlipidemia    Hypertension    IBS (irritable bowel syndrome)    PMS (premenstrual syndrome)    Reflux     Surgical History: Past Surgical History:  Procedure Laterality Date   BREAST BIOPSY Right 12/07/2022   MM RT BREAST BX W LOC DEV 1ST LESION IMAGE BX SPEC STEREO GUIDE 12/07/2022 [facility]   COLONOSCOPY  08/29/2011   [name]: normal. high risk screening tcs in 5 years.    COLONOSCOPY WITH PROPOFOL N/A 10/20/2016   Procedure: COLONOSCOPY WITH PROPOFOL;  Surgeon: Corbin Ade, MD;  [facility];  Service: Endoscopy;  Laterality: N/A;  10:45am   COLONOSCOPY WITH PROPOFOL N/A 02/09/2022   Procedure: COLONOSCOPY WITH PROPOFOL;  Surgeon: Corbin Ade, MD;  [facility];  Service: Endoscopy;  Laterality: N/A;  2:30 / ASA 2   ESOPHAGOGASTRODUODENOSCOPY (EGD) WITH PROPOFOL N/A 10/20/2016   Procedure: ESOPHAGOGASTRODUODENOSCOPY (EGD) WITH PROPOFOL;  Surgeon: Corbin Ade, MD;  [facility];  Service: Endoscopy;  Laterality: N/A;   LUMBAR DISC SURGERY     MALONEY DILATION N/A 10/20/2016   Procedure: Elease Hashimoto  DILATION;  Surgeon: Corbin Ade, MD;  [facility];  Service: Endoscopy;  Laterality: N/A;   POLYPECTOMY  10/20/2016   Procedure: POLYPECTOMY;  Surgeon: Corbin Ade, MD;  [facility];  Service: Endoscopy;;  cecal and sigmoid polypectomies    Social History: Social History   Socioeconomic History   Marital status: Divorced    Spouse name: Not on file   Number of children: Not on file   Years of education: Not on file   Highest education level: Not on file  Occupational History   Not on file  Tobacco Use   Smoking status: Never    Passive exposure: Current   Smokeless tobacco: Never  Vaping Use   Vaping Use: Never used  Substance and Sexual Activity   Alcohol use: Yes    Comment: occ   Drug use: No   Sexual activity: Yes    Birth control/protection: None, Post-menopausal  Other Topics Concern   Not on file  Social History Narrative   Lives with her brother. Pt is on disability from her lumbar fusion.    Social Determinants of Health   Financial Resource Strain: Not on file  Food Insecurity: Not on file  Transportation Needs: Not on file  Physical Activity: Not on file  Stress: Not on file  Social Connections: Not on file  Intimate Partner Violence: Not on file    Family History: Family History  Problem Relation Age of Onset   Hypertension Mother    Colon polyps Mother    Hypertension Father    Diabetes Father    Hypertension Sister    Hyperlipidemia Sister    Colon polyps Brother    Arthritis Brother    Diabetes Maternal Aunt    Hypertension Maternal Aunt    Rheum arthritis Maternal Grandmother    Prostate cancer Maternal Grandfather    Cancer Paternal Grandfather        not sure of type of cancer   Breast cancer Neg Hx    Cervical cancer Neg Hx     Current Medications:  Current Outpatient Medications:    albuterol (VENTOLIN HFA) 108 (90 Base) MCG/ACT inhaler, Inhale 1-2 puffs into the lungs every 6 (six) hours as needed for  wheezing or shortness of breath., Disp: 18 g, Rfl: 0   atorvastatin (LIPITOR) 40 MG tablet, Take 1 tablet (40 mg total) by mouth daily., Disp: 90 tablet, Rfl: 3   benzonatate (TESSALON) 100 MG capsule, Take 1 capsule (100 mg total) by mouth 3 (three) times daily as needed for cough. Do not take with alcohol or while driving or operating heavy machinery.  May cause drowsiness., Disp: 21 capsule, Rfl: 0   budesonide-formoterol (SYMBICORT) 160-4.5 MCG/ACT inhaler, Inhale 2 puffs into the lungs 2 (two) times daily. (Patient taking differently: Inhale 2 puffs into the lungs daily as needed (Shortness of breath).), Disp: 1 Inhaler, Rfl: 5   Cholecalciferol (VITAMIN D) 125 MCG (5000 UT) CAPS, Take 5,000 Units by mouth daily with breakfast., Disp: 30 capsule, Rfl: 2   diclofenac Sodium (VOLTAREN) 1 % GEL, Apply 2-4 grams to affected joint 4 times daily as needed., Disp: 400 g, Rfl: 2   escitalopram (LEXAPRO) 20 MG tablet, Take 1 tablet (20 mg total) by mouth daily., Disp: 90 tablet, Rfl: 1   fluticasone (FLONASE) 50 MCG/ACT nasal spray, Place 2 sprays into both nostrils daily. (Patient taking differently: Place 2 sprays into both nostrils daily as needed for allergies.), Disp: 48 g, Rfl: 5   glucose blood (ACCU-CHEK GUIDE) test strip, CHECK BLOOD SUGAR ONCE  DAILY, Disp: 100 strip, Rfl: 3   hydrochlorothiazide (HYDRODIURIL) 25 MG tablet, TAKE 1 TABLET BY MOUTH ONCE DAILY AS NEEDED, Disp: 90 tablet, Rfl: 1   hydrOXYzine (VISTARIL) 50 MG capsule, Take 1 capsule (50 mg total) by mouth at bedtime as needed., Disp: 30 capsule, Rfl: 1   levocetirizine (XYZAL) 5 MG tablet, Take 1 tablet (5 mg total) by mouth every evening., Disp: 90 tablet, Rfl: 0   losartan (COZAAR) 50 MG tablet, Take 1 tablet (50 mg total) by mouth daily.,  Disp: 90 tablet, Rfl: 1   metFORMIN (GLUCOPHAGE) 500 MG tablet, Take 1 tablet (500 mg total) by mouth daily., Disp: 180 tablet, Rfl: 3   Omega-3 Fatty Acids (FISH OIL OMEGA-3) 1000 MG CAPS, Take  1 g by mouth daily., Disp: 90 capsule, Rfl: 1   omeprazole (PRILOSEC) 40 MG capsule, TAKE 1 CAPSULE BY MOUTH DAILY., Disp: 90 capsule, Rfl: 0   Probiotic Product (PROBIOTIC DAILY PO), Take by mouth., Disp: , Rfl:    promethazine-dextromethorphan (PROMETHAZINE-DM) 6.25-15 MG/5ML syrup, Take 5 mLs by mouth 4 (four) times daily as needed for cough., Disp: 118 mL, Rfl: 0   tirzepatide (MOUNJARO) 5 MG/0.5ML Pen, Inject 5 mg into the skin once a week., Disp: 2 mL, Rfl: 0   tiZANidine (ZANAFLEX) 4 MG tablet, TAKE (1) TABLET BY MOUTH EVERY SIX HOURS AS NEEDED FOR MUSCLE SPASMS., Disp: 30 tablet, Rfl: 0   Allergies: Allergies  Allergen Reactions   Sulfa Antibiotics Hives, Diarrhea and Itching   Other     SURGICAL TAPE Reaction: rash    Penicillins Hives, Itching and Rash    Pt states that she is able to take PCN  Has patient had a PCN reaction causing immediate rash, facial/tongue/throat swelling, SOB or lightheadedness with hypotension: Yes Has patient had a PCN reaction causing severe rash involving mucus membranes or skin necrosis: No Has patient had a PCN reaction that required hospitalization: No Has patient had a PCN reaction occurring within the last 10 years: Yes If all of the above answers are "NO", then may proceed with Cephalosporin use.    REVIEW OF SYSTEMS:   Review of Systems  Constitutional:  Negative for chills, fatigue and fever.  HENT:   Negative for lump/mass, mouth sores, nosebleeds, sore throat and trouble swallowing.   Eyes:  Negative for eye problems.  Respiratory:  Negative for cough and shortness of breath.   Cardiovascular:  Negative for chest pain, leg swelling and palpitations.  Gastrointestinal:  Positive for diarrhea, nausea and vomiting. Negative for abdominal pain and constipation.  Genitourinary:  Negative for bladder incontinence, difficulty urinating, dysuria, frequency, hematuria and nocturia.   Musculoskeletal:  Negative for arthralgias, back pain, flank  pain, myalgias and neck pain.  Skin:  Negative for itching and rash.  Neurological:  Negative for dizziness, headaches and numbness.  Hematological:  Does not bruise/bleed easily.  Psychiatric/Behavioral:  Positive for sleep disturbance. Negative for depression and suicidal ideas. The patient is not nervous/anxious.   All other systems reviewed and are negative.    VITALS:   Blood pressure 139/67, pulse 97, temperature 99 F (37.2 C), temperature source Oral, resp. rate 16, height 5' 5.5" (1.664 m), weight 154 lb 6.4 oz (70 kg), last menstrual period 09/03/2013, SpO2 98 %.  Wt Readings from Last 3 Encounters:  01/27/23 154 lb 6.4 oz (70 kg)  12/27/22 161 lb 6.4 oz (73.2 kg)  12/27/22 161 lb 6.4 oz (73.2 kg)    Body mass index is 25.3 kg/m.   PHYSICAL EXAM:   Physical Exam Vitals and nursing note reviewed. Exam conducted with a chaperone present.  Constitutional:      Appearance: Normal appearance.  Cardiovascular:     Rate and Rhythm: Normal rate and regular rhythm.     Pulses: Normal pulses.     Heart sounds: Normal heart sounds.  Pulmonary:     Effort: Pulmonary effort is normal.     Breath sounds: Normal breath sounds.  Abdominal:     Palpations: Abdomen is soft.  There is no hepatomegaly, splenomegaly or mass.     Tenderness: There is no abdominal tenderness.  Musculoskeletal:     Right lower leg: No edema.     Left lower leg: No edema.  Lymphadenopathy:     Cervical: No cervical adenopathy.     Right cervical: No superficial, deep or posterior cervical adenopathy.    Left cervical: No superficial, deep or posterior cervical adenopathy.     Upper Body:     Right upper body: No supraclavicular or axillary adenopathy.     Left upper body: No supraclavicular or axillary adenopathy.  Neurological:     General: No focal deficit present.     Mental Status: She is alert and oriented to person, place, and time.  Psychiatric:        Mood and Affect: Mood normal.         Behavior: Behavior normal.     LABS:      Latest Ref Rng & Units 12/01/2022   11:29 AM 08/26/2022    2:13 PM 09/02/2021   12:10 PM  CBC  WBC 3.4 - 10.8 x10E3/uL 6.9  7.8  7.6   Hemoglobin 11.1 - 15.9 g/dL 16.1  09.6  04.5   Hematocrit 34.0 - 46.6 % 38.2  37.7  41.4   Platelets 150 - 450 x10E3/uL 363  310  377       Latest Ref Rng & Units 01/27/2023   11:15 AM 12/22/2022    1:28 PM 12/01/2022   11:29 AM  CMP  Glucose 70 - 99 mg/dL 409   811   BUN 8 - 23 mg/dL 10   9   Creatinine 9.14 - 1.00 mg/dL 7.82   9.56   Sodium 213 - 145 mmol/L 133   139   Potassium 3.5 - 5.1 mmol/L 2.9   3.8   Chloride 98 - 111 mmol/L 95   97   CO2 22 - 32 mmol/L 26   25   Calcium 8.9 - 10.3 mg/dL 9.3   08.6   Total Protein 6.5 - 8.1 g/dL 8.3  7.7  7.8   Total Bilirubin 0.3 - 1.2 mg/dL 1.5  1.3  1.0   Alkaline Phos 38 - 126 U/L 80  77  107   AST 15 - 41 U/L 42  39  51   ALT 0 - 44 U/L 45  57  47      No results found for: "CEA1", "CEA" / No results found for: "CEA1", "CEA" No results found for: "PSA1" No results found for: "CAN199" No results found for: "CAN125"  Lab Results  Component Value Date   ALBUMINELP 4.3 09/12/2022   A1GS 0.3 12/02/2021   A2GS 0.8 12/02/2021   BETS 0.5 12/02/2021   BETA2SER 0.5 12/02/2021   GAMS 1.2 12/02/2021   MSPIKE Not Observed 09/12/2022   SPEI  12/02/2021     Comment:     Normal Serum Protein Electrophoresis Pattern. No abnormal protein bands (M-protein) detected.    Lab Results  Component Value Date   TIBC 324 01/27/2023   TIBC 276 09/02/2021   FERRITIN 263 01/27/2023   FERRITIN 426 (H) 12/22/2022   FERRITIN 260 10/07/2022   IRONPCTSAT 28 01/27/2023   IRONPCTSAT 43 09/02/2021   No results found for: "LDH"   STUDIES:   No results found.    - Hemochromatosis testing negative

## 2023-01-27 ENCOUNTER — Inpatient Hospital Stay: Payer: No Typology Code available for payment source

## 2023-01-27 ENCOUNTER — Inpatient Hospital Stay: Payer: No Typology Code available for payment source | Attending: Hematology | Admitting: Hematology

## 2023-01-27 VITALS — BP 139/67 | HR 97 | Temp 99.0°F | Resp 16 | Ht 65.5 in | Wt 154.4 lb

## 2023-01-27 DIAGNOSIS — E119 Type 2 diabetes mellitus without complications: Secondary | ICD-10-CM | POA: Insufficient documentation

## 2023-01-27 DIAGNOSIS — R7989 Other specified abnormal findings of blood chemistry: Secondary | ICD-10-CM

## 2023-01-27 DIAGNOSIS — Z882 Allergy status to sulfonamides status: Secondary | ICD-10-CM | POA: Insufficient documentation

## 2023-01-27 DIAGNOSIS — M549 Dorsalgia, unspecified: Secondary | ICD-10-CM | POA: Diagnosis not present

## 2023-01-27 DIAGNOSIS — R42 Dizziness and giddiness: Secondary | ICD-10-CM | POA: Insufficient documentation

## 2023-01-27 DIAGNOSIS — Z8249 Family history of ischemic heart disease and other diseases of the circulatory system: Secondary | ICD-10-CM | POA: Insufficient documentation

## 2023-01-27 DIAGNOSIS — R197 Diarrhea, unspecified: Secondary | ICD-10-CM | POA: Insufficient documentation

## 2023-01-27 DIAGNOSIS — F419 Anxiety disorder, unspecified: Secondary | ICD-10-CM | POA: Diagnosis not present

## 2023-01-27 DIAGNOSIS — I10 Essential (primary) hypertension: Secondary | ICD-10-CM | POA: Diagnosis not present

## 2023-01-27 DIAGNOSIS — E876 Hypokalemia: Secondary | ICD-10-CM | POA: Diagnosis not present

## 2023-01-27 DIAGNOSIS — G479 Sleep disorder, unspecified: Secondary | ICD-10-CM | POA: Insufficient documentation

## 2023-01-27 DIAGNOSIS — Z83719 Family history of colon polyps, unspecified: Secondary | ICD-10-CM | POA: Insufficient documentation

## 2023-01-27 DIAGNOSIS — G8929 Other chronic pain: Secondary | ICD-10-CM | POA: Diagnosis not present

## 2023-01-27 DIAGNOSIS — Z8349 Family history of other endocrine, nutritional and metabolic diseases: Secondary | ICD-10-CM | POA: Diagnosis not present

## 2023-01-27 DIAGNOSIS — M2550 Pain in unspecified joint: Secondary | ICD-10-CM | POA: Diagnosis not present

## 2023-01-27 DIAGNOSIS — R768 Other specified abnormal immunological findings in serum: Secondary | ICD-10-CM | POA: Diagnosis not present

## 2023-01-27 DIAGNOSIS — Z880 Allergy status to penicillin: Secondary | ICD-10-CM | POA: Insufficient documentation

## 2023-01-27 DIAGNOSIS — E785 Hyperlipidemia, unspecified: Secondary | ICD-10-CM | POA: Insufficient documentation

## 2023-01-27 DIAGNOSIS — R112 Nausea with vomiting, unspecified: Secondary | ICD-10-CM | POA: Diagnosis not present

## 2023-01-27 DIAGNOSIS — Z79899 Other long term (current) drug therapy: Secondary | ICD-10-CM | POA: Diagnosis not present

## 2023-01-27 DIAGNOSIS — Z8261 Family history of arthritis: Secondary | ICD-10-CM | POA: Diagnosis not present

## 2023-01-27 DIAGNOSIS — Z8042 Family history of malignant neoplasm of prostate: Secondary | ICD-10-CM | POA: Insufficient documentation

## 2023-01-27 DIAGNOSIS — Z809 Family history of malignant neoplasm, unspecified: Secondary | ICD-10-CM | POA: Insufficient documentation

## 2023-01-27 DIAGNOSIS — K219 Gastro-esophageal reflux disease without esophagitis: Secondary | ICD-10-CM | POA: Diagnosis not present

## 2023-01-27 DIAGNOSIS — F32A Depression, unspecified: Secondary | ICD-10-CM | POA: Diagnosis not present

## 2023-01-27 DIAGNOSIS — Z833 Family history of diabetes mellitus: Secondary | ICD-10-CM | POA: Insufficient documentation

## 2023-01-27 LAB — IRON AND TIBC
Iron: 90 ug/dL (ref 28–170)
Saturation Ratios: 28 % (ref 10.4–31.8)
TIBC: 324 ug/dL (ref 250–450)
UIBC: 234 ug/dL

## 2023-01-27 LAB — COMPREHENSIVE METABOLIC PANEL
ALT: 45 U/L — ABNORMAL HIGH (ref 0–44)
AST: 42 U/L — ABNORMAL HIGH (ref 15–41)
Albumin: 4.5 g/dL (ref 3.5–5.0)
Alkaline Phosphatase: 80 U/L (ref 38–126)
Anion gap: 12 (ref 5–15)
BUN: 10 mg/dL (ref 8–23)
CO2: 26 mmol/L (ref 22–32)
Calcium: 9.3 mg/dL (ref 8.9–10.3)
Chloride: 95 mmol/L — ABNORMAL LOW (ref 98–111)
Creatinine, Ser: 0.89 mg/dL (ref 0.44–1.00)
GFR, Estimated: >60 mL/min (ref >60)
Glucose, Bld: 123 mg/dL — ABNORMAL HIGH (ref 70–99)
Potassium: 2.9 mmol/L — ABNORMAL LOW (ref 3.5–5.1)
Sodium: 133 mmol/L — ABNORMAL LOW (ref 135–145)
Total Bilirubin: 1.5 mg/dL — ABNORMAL HIGH (ref 0.3–1.2)
Total Protein: 8.3 g/dL — ABNORMAL HIGH (ref 6.5–8.1)

## 2023-01-27 LAB — FERRITIN: Ferritin: 263 ng/mL (ref 11–307)

## 2023-01-27 LAB — SEDIMENTATION RATE: Sed Rate: 30 mm/hr — ABNORMAL HIGH (ref 0–22)

## 2023-01-27 LAB — C-REACTIVE PROTEIN: CRP: 0.8 mg/dL (ref <1.0)

## 2023-01-27 NOTE — Patient Instructions (Addendum)
[facility]  Discharge Instructions  You were seen and examined today by Dr. Ellin Saba. Dr. Ellin Saba is a hematologist, meaning that he specializes in blood abnormalities. Dr. Ellin Saba discussed your past medical history, family history of cancers/blood conditions and the events that led to you being here today.  You were referred to Dr. Ellin Saba due to elevated ferritin. Ferritin is the storage form of iron in the body.  Avoid cooking in cast iron pans, drinking beer or wine, and avoid taking any vitamins with Vitamin C or iron in them. All of these things contribute to elevated iron.  Dr. Ellin Saba has recommended additional labs today for further evaluation. Dr. Ellin Saba has also recommended a 24 hour urine collection.  Follow-up as scheduled.  Thank you for choosing [facility] to provide your oncology and hematology care.   To afford each patient quality time with our provider, please arrive at least 15 minutes before your scheduled appointment time. You may need to reschedule your appointment if you arrive late (10 or more minutes). Arriving late affects you and other patients whose appointments are after yours.  Also, if you miss three or more appointments without notifying the office, you may be dismissed from the clinic at the provider's discretion.    Again, thank you for choosing [facility].  Our hope is that these requests will decrease the amount of time that you wait before being seen by our physicians.   If you have a lab appointment with the Cancer Center - please note that after April 8th, all labs will be drawn in the cancer center.  You do not have to check in or register with the main entrance as you have in the past but will complete your check-in at the cancer center.            _____________________________________________________________  Should you have questions after your visit to [facility] please contact our office at [phone] and follow the prompts.  Our office hours are 8:00 a.m. to 4:30 p.m. Monday - Thursday and 8:00 a.m. to 2:30 p.m. Friday.  Please note that voicemails left after 4:00 p.m. may not be returned until the following business day.  We are closed weekends and all major holidays.  You do have access to a nurse 24-7, just call the main number to the clinic [phone] and do not press any options, hold on the line and a nurse will answer the phone.    For prescription refill requests, have your pharmacy contact our office and allow 72 hours.    Masks are no longer required in the cancer centers. If you would like for your care team to wear a mask while they are taking care of you, please let them know. You may have one support person who is at least 62 years old accompany you for your appointments.

## 2023-01-29 LAB — RHEUMATOID FACTOR: Rheumatoid fact SerPl-aCnc: <10 IU/mL (ref <14.0)

## 2023-01-30 ENCOUNTER — Telehealth: Payer: Self-pay | Admitting: Family Medicine

## 2023-01-30 LAB — KAPPA/LAMBDA LIGHT CHAINS
Kappa free light chain: 27.6 mg/L — ABNORMAL HIGH (ref 3.3–19.4)
Kappa, lambda light chain ratio: 1.32 (ref 0.26–1.65)
Lambda free light chains: 20.9 mg/L (ref 5.7–26.3)

## 2023-01-30 NOTE — Telephone Encounter (Signed)
Patient assistance form  Copied Noted Sleeved  Call patient when [phone]

## 2023-01-31 ENCOUNTER — Other Ambulatory Visit: Payer: Self-pay | Admitting: Family Medicine

## 2023-01-31 LAB — FANA STAINING PATTERNS: Speckled Pattern: 24529 — ABNORMAL HIGH

## 2023-01-31 LAB — IMMUNOGLOBULINS A/E/G/M, SERUM
IgA: 561 mg/dL — ABNORMAL HIGH (ref 87–352)
IgE (Immunoglobulin E), Serum: 61 IU/mL (ref 6–495)
IgG (Immunoglobin G), Serum: 1276 mg/dL (ref 586–1602)
IgM (Immunoglobulin M), Srm: 116 mg/dL (ref 26–217)

## 2023-01-31 LAB — ANTINUCLEAR ANTIBODIES, IFA: ANA Ab, IFA: POSITIVE — AB

## 2023-02-07 ENCOUNTER — Telehealth: Payer: Self-pay | Admitting: Family Medicine

## 2023-02-07 ENCOUNTER — Other Ambulatory Visit: Payer: Self-pay | Admitting: Family Medicine

## 2023-02-07 DIAGNOSIS — E1165 Type 2 diabetes mellitus with hyperglycemia: Secondary | ICD-10-CM

## 2023-02-07 MED ORDER — TIRZEPATIDE 7.5 MG/0.5ML ~~LOC~~ SOAJ
7.5000 mg | SUBCUTANEOUS | 0 refills | Status: DC
Start: 2023-02-07 — End: 2023-02-20

## 2023-02-07 NOTE — Telephone Encounter (Signed)
Patient dropped off the [employer] forms Patient Assistance Program forms  Copied Noted Sleeved  Fax to [employer] [phone] and call patient to pick up a copy

## 2023-02-07 NOTE — Telephone Encounter (Signed)
Spoke with pt let her know rx was sent.

## 2023-02-07 NOTE — Telephone Encounter (Signed)
A prescription for Mounjaro 7.5 mg is sent to her pharmacy

## 2023-02-07 NOTE — Telephone Encounter (Signed)
Patient came by the office  and needs refill on  tirzepatide [facility]) 5 MG/0.5ML Pen [[id number]]   Until the ozempic gets approval.  Pharmacy: [facility]

## 2023-02-08 ENCOUNTER — Other Ambulatory Visit (HOSPITAL_COMMUNITY)
Admission: RE | Admit: 2023-02-08 | Discharge: 2023-02-08 | Disposition: A | Discharge status: Home / Self Care | Payer: No Typology Code available for payment source | Source: Ambulatory Visit | Attending: Hematology | Admitting: Hematology

## 2023-02-08 ENCOUNTER — Other Ambulatory Visit: Payer: Self-pay | Admitting: Family Medicine

## 2023-02-08 DIAGNOSIS — R7989 Other specified abnormal findings of blood chemistry: Secondary | ICD-10-CM | POA: Diagnosis not present

## 2023-02-08 DIAGNOSIS — M62838 Other muscle spasm: Secondary | ICD-10-CM

## 2023-02-08 DIAGNOSIS — G479 Sleep disorder, unspecified: Secondary | ICD-10-CM

## 2023-02-13 LAB — UPEP/UIFE/LIGHT CHAINS/TP, 24-HR UR
% BETA, Urine: 0 %
ALPHA 1 URINE: 0 %
Albumin, U: 100 %
Alpha 2, Urine: 0 %
Free Kappa Lt Chains,Ur: 3.69 mg/L (ref 1.17–86.46)
Free Kappa/Lambda Ratio: >5.35 (ref 1.83–14.26)
Free Lambda Lt Chains,Ur: <0.69 mg/L (ref 0.27–15.21)
GAMMA GLOBULIN URINE: 0 %
Total Protein, Urine-Ur/day: 73 mg/24 hr (ref 30–150)
Total Protein, Urine: 4.7 mg/dL
Total Volume: 1550

## 2023-02-13 NOTE — Progress Notes (Unsigned)
[facility] [address] [place] [address]   Clinic Day:  02/15/2023  Referring physician: Gilmore Laroche, FNP  Patient Care Team: Gilmore Laroche, FNP as PCP - General (Family Medicine) Doreatha Massed, MD as Medical Oncologist (Hematology)   ASSESSMENT & PLAN:   Assessment:  1.  Elevated ferritin and IgA levels: - Followed with GI for likely alcohol related to fatty liver disease - Elevated ferritin levels between 400-588 since January 2023 - Hemochromatosis mutation testing 03/2022: Negative - IgA levels elevated since 08/2021 [phone]) - US abdomen with elastography (10/07/2022): Diffuse fatty infiltration. - She Cooks in cast iron pans daily. - No fevers.  Occasional sweating in the head/legs but not drenching type.  Lost 20 pounds in the last 2 months after being on Mounjaro and now Ozempic.  No recurrent infections.  2.  Social/family history: - She is independent of ADLs and IADLs.  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  Reports exposure to chemicals.  She has been on disability since back surgery.  Non-smoker.  She used to drink beer daily until 3 months ago.  She now drinks occasional glass of wine or a mixed drink. - Mother had hepatitis.  Paternal grandfather had stomach cancer.  Plan:  1.  Elevated IgA levels: - Labs from 02/08/23 show a positive ANA with speckled pattern and normal RF. Iron studies WNL. Kappa free light chains continue to be slightly elevated at 27.6 with a normal Lambda free light chains and kappa, lambda light chain ratio. Sed rate slightly elevated at 30 with normal c-reative protein. Immunofixation from 09/12/22 shows polyclonal increase detected in 1 or more immunoglobulins. -UPEP WNL.  -Increase in IgA level is likely secondary to elevated ANA / chronic inflammatory process.   2.  Elevated ferritin levels: - Hemochromatosis workup was negative in August 2023. -Repeat ferritin levels are  263 which is normal, and iron saturations of 28%. - Mild elevation in sed rate with normal CRP.  3.  Positive ANA: -Discussed findings.  Does not feel she needs referral to rheumatology at this time. -Discussed if she develops any new concerning symptoms, we can refer at that time.  4 Hypokalemia: - Potassium 2.9.  -Discussed starting potassium supplements 20 mill equivalents twice daily x 1 week and then once a day until discontinued. -Discussed incorporating potassium rich foods in her diet. -Will recheck lab work in 1 month.  Will call with results.  Orders Placed This Encounter  Procedures   Potassium    Standing Status:   Future    Standing Expiration Date:   02/12/2024   PLAN SUMMARY: >> Return to clinic in 4 weeks to redraw potassium level and we will call her with the results. >> Return to clinic in 6 months for follow-up with lab work (CBC, CMP, ferritin, iron). If normal, can likely be discharged from clinic.     I spent 20 minutes dedicated to the care of this patient (face-to-face and non-face-to-face) on the date of the encounter to include what is described in the assessment and plan.   Mauro Kaufmann, NP   6/26/202411:52 AM  CHIEF COMPLAINT/PURPOSE OF CONSULT:   Diagnosis: elevated ferritin  Current Therapy: Surveillance  HISTORY OF PRESENT ILLNESS:   Laura Scott is a 62 y.o. female presenting to clinic today for evaluation of elevated ferritin at the request of Tana Coast, PA-C.  Reports she has been doing well.  Has trouble falling asleep.  Appetite  100%.  Energy levels 80%.  Denies any pain.  States she has an appointment with endocrinology for her thyroid on 02/20/23.  No history of autoimmune  issues.  Has intermittent back pains and occasional joint pain.  Denies swelling.  PAST MEDICAL HISTORY:   Past Medical History: Past Medical History:  Diagnosis Date   Allergic rhinitis    Anxiety    Chronic pain    Depression    Diabetes mellitus without  complication (HCC)    GERD (gastroesophageal reflux disease)    Hyperlipidemia    Hypertension    IBS (irritable bowel syndrome)    PMS (premenstrual syndrome)    Reflux     Surgical History: Past Surgical History:  Procedure Laterality Date   BREAST BIOPSY Right 12/07/2022   MM RT BREAST BX W LOC DEV 1ST LESION IMAGE BX SPEC STEREO GUIDE 12/07/2022 [facility]   COLONOSCOPY  08/29/2011   [name]: normal. high risk screening tcs in 5 years.    COLONOSCOPY WITH PROPOFOL N/A 10/20/2016   Procedure: COLONOSCOPY WITH PROPOFOL;  Surgeon: Corbin Ade, MD;  [facility];  Service: Endoscopy;  Laterality: N/A;  10:45am   COLONOSCOPY WITH PROPOFOL N/A 02/09/2022   Procedure: COLONOSCOPY WITH PROPOFOL;  Surgeon: Corbin Ade, MD;  Location: [facility];  Service: Endoscopy;  Laterality: N/A;  2:30 / ASA 2   ESOPHAGOGASTRODUODENOSCOPY (EGD) WITH PROPOFOL N/A 10/20/2016   Procedure: ESOPHAGOGASTRODUODENOSCOPY (EGD) WITH PROPOFOL;  Surgeon: Corbin Ade, MD;  [facility]: [facility];  Service: Endoscopy;  Laterality: N/A;   LUMBAR DISC SURGERY     MALONEY DILATION N/A 10/20/2016   Procedure: Elease Hashimoto DILATION;  Surgeon: Corbin Ade, MD;  [facility];  Service: Endoscopy;  Laterality: N/A;   POLYPECTOMY  10/20/2016   Procedure: POLYPECTOMY;  Surgeon: Corbin Ade, MD;  [facility];  Service: Endoscopy;;  cecal and sigmoid polypectomies    Social History: Social History   Socioeconomic History   Marital status: Divorced    Spouse name: Not on file   Number of children: Not on file   Years of education: Not on file   Highest education level: Not on file  Occupational History   Not on file  Tobacco Use   Smoking status: Never    Passive exposure: Current   Smokeless tobacco: Never  Vaping Use   Vaping Use: Never used  Substance and Sexual Activity   Alcohol use: Yes    Comment: occ   Drug use: No   Sexual activity: Yes    Birth  control/protection: None, Post-menopausal  Other Topics Concern   Not on file  Social History Narrative   Lives with her brother. Pt is on disability from her lumbar fusion.    Social Determinants of Health   Financial Resource Strain: Not on file  Food Insecurity: Not on file  Transportation Needs: Not on file  Physical Activity: Not on file  Stress: Not on file  Social Connections: Not on file  Intimate Partner Violence: Not on file    Family History: Family History  Problem Relation Age of Onset   Hypertension Mother    Colon polyps Mother    Hypertension Father    Diabetes Father    Hypertension Sister    Hyperlipidemia Sister    Colon polyps Brother    Arthritis Brother    Diabetes Maternal Aunt    Hypertension Maternal Aunt    Rheum arthritis Maternal Grandmother  Prostate cancer Maternal Grandfather    Cancer Paternal Grandfather        not sure of type of cancer   Breast cancer Neg Hx    Cervical cancer Neg Hx     Current Medications:  Current Outpatient Medications:    albuterol (VENTOLIN HFA) 108 (90 Base) MCG/ACT inhaler, Inhale 1-2 puffs into the lungs every 6 (six) hours as needed for wheezing or shortness of breath., Disp: 18 g, Rfl: 0   atorvastatin (LIPITOR) 40 MG tablet, Take 1 tablet (40 mg total) by mouth daily., Disp: 90 tablet, Rfl: 3   benzonatate (TESSALON) 100 MG capsule, Take 1 capsule (100 mg total) by mouth 3 (three) times daily as needed for cough. Do not take with alcohol or while driving or operating heavy machinery.  May cause drowsiness., Disp: 21 capsule, Rfl: 0   budesonide-formoterol (SYMBICORT) 160-4.5 MCG/ACT inhaler, Inhale 2 puffs into the lungs 2 (two) times daily. (Patient taking differently: Inhale 2 puffs into the lungs daily as needed (Shortness of breath).), Disp: 1 Inhaler, Rfl: 5   Cholecalciferol (VITAMIN D) 125 MCG (5000 UT) CAPS, Take 5,000 Units by mouth daily with breakfast., Disp: 30 capsule, Rfl: 2   diclofenac  Sodium (VOLTAREN) 1 % GEL, Apply 2-4 grams to affected joint 4 times daily as needed., Disp: 400 g, Rfl: 2   escitalopram (LEXAPRO) 20 MG tablet, Take 1 tablet (20 mg total) by mouth daily., Disp: 90 tablet, Rfl: 1   fluticasone (FLONASE) 50 MCG/ACT nasal spray, Place 2 sprays into both nostrils daily. (Patient taking differently: Place 2 sprays into both nostrils daily as needed for allergies.), Disp: 48 g, Rfl: 5   glucose blood (ACCU-CHEK GUIDE) test strip, CHECK BLOOD SUGAR ONCE  DAILY, Disp: 100 strip, Rfl: 3   hydrochlorothiazide (HYDRODIURIL) 25 MG tablet, TAKE 1 TABLET BY MOUTH ONCE DAILY AS NEEDED, Disp: 90 tablet, Rfl: 1   hydrOXYzine (VISTARIL) 50 MG capsule, TAKE (1) CAPSULE BY MOUTH AT BEDTIME AS NEEDED., Disp: 30 capsule, Rfl: 0   levocetirizine (XYZAL) 5 MG tablet, Take 1 tablet (5 mg total) by mouth every evening., Disp: 90 tablet, Rfl: 0   losartan (COZAAR) 50 MG tablet, Take 1 tablet (50 mg total) by mouth daily., Disp: 90 tablet, Rfl: 1   metFORMIN (GLUCOPHAGE) 500 MG tablet, Take 1 tablet (500 mg total) by mouth daily., Disp: 180 tablet, Rfl: 3   Omega-3 Fatty Acids (FISH OIL OMEGA-3) 1000 MG CAPS, Take 1 g by mouth daily., Disp: 90 capsule, Rfl: 1   omeprazole (PRILOSEC) 40 MG capsule, TAKE 1 CAPSULE BY MOUTH DAILY., Disp: 90 capsule, Rfl: 0   potassium chloride SA (KLOR-CON M) 20 MEQ tablet, Take 1 tablet (20 mEq total) by mouth daily., Disp: 60 tablet, Rfl: 0   Probiotic Product (PROBIOTIC DAILY PO), Take by mouth., Disp: , Rfl:    promethazine-dextromethorphan (PROMETHAZINE-DM) 6.25-15 MG/5ML syrup, Take 5 mLs by mouth 4 (four) times daily as needed for cough., Disp: 118 mL, Rfl: 0   tirzepatide (MOUNJARO) 7.5 MG/0.5ML Pen, Inject 7.5 mg into the skin once a week., Disp: 2 mL, Rfl: 0   tiZANidine (ZANAFLEX) 4 MG tablet, TAKE (1) TABLET BY MOUTH EVERY SIX HOURS AS NEEDED FOR MUSCLE SPASMS., Disp: 30 tablet, Rfl: 0   Allergies: Allergies  Allergen Reactions   Sulfa  Antibiotics Hives, Diarrhea and Itching   Other     SURGICAL TAPE Reaction: rash    Penicillins Hives, Itching and Rash    Pt states that  she is able to take PCN  Has patient had a PCN reaction causing immediate rash, facial/tongue/throat swelling, SOB or lightheadedness with hypotension: Yes Has patient had a PCN reaction causing severe rash involving mucus membranes or skin necrosis: No Has patient had a PCN reaction that required hospitalization: No Has patient had a PCN reaction occurring within the last 10 years: Yes If all of the above answers are "NO", then may proceed with Cephalosporin use.    REVIEW OF SYSTEMS:   Review of Systems  HENT:   Positive for trouble swallowing.   Neurological:  Positive for dizziness.     VITALS:   Blood pressure 116/63, pulse 75, temperature (!) 96.8 F (36 C), temperature source Tympanic, resp. rate 17, weight 156 lb 1.4 oz (70.8 kg), last menstrual period 09/03/2013, SpO2 100 %.  Wt Readings from Last 3 Encounters:  02/14/23 156 lb 1.4 oz (70.8 kg)  01/27/23 154 lb 6.4 oz (70 kg)  12/27/22 161 lb 6.4 oz (73.2 kg)    Body mass index is 25.58 kg/m.   PHYSICAL EXAM:   Physical Exam Constitutional:      Appearance: Normal appearance.  HENT:     Head: Normocephalic and atraumatic.  Eyes:     Pupils: Pupils are equal, round, and reactive to light.  Cardiovascular:     Rate and Rhythm: Normal rate and regular rhythm.     Heart sounds: Normal heart sounds. No murmur heard. Pulmonary:     Effort: Pulmonary effort is normal.     Breath sounds: Normal breath sounds. No wheezing.  Abdominal:     General: Bowel sounds are normal. There is no distension.     Palpations: Abdomen is soft.     Tenderness: There is no abdominal tenderness.  Musculoskeletal:        General: Normal range of motion.     Cervical back: Normal range of motion.  Skin:    General: Skin is warm and dry.     Findings: No rash.  Neurological:     Mental Status:  She is alert and oriented to person, place, and time.  Psychiatric:        Judgment: Judgment normal.     LABS:      Latest Ref Rng & Units 12/01/2022   11:29 AM 08/26/2022    2:13 PM 09/02/2021   12:10 PM  CBC  WBC 3.4 - 10.8 x10E3/uL 6.9  7.8  7.6   Hemoglobin 11.1 - 15.9 g/dL 16.1  09.6  04.5   Hematocrit 34.0 - 46.6 % 38.2  37.7  41.4   Platelets 150 - 450 x10E3/uL 363  310  377       Latest Ref Rng & Units 01/27/2023   11:15 AM 12/22/2022    1:28 PM 12/01/2022   11:29 AM  CMP  Glucose 70 - 99 mg/dL 409   811   BUN 8 - 23 mg/dL 10   9   Creatinine 9.14 - 1.00 mg/dL 7.82   9.56   Sodium 213 - 145 mmol/L 133   139   Potassium 3.5 - 5.1 mmol/L 2.9   3.8   Chloride 98 - 111 mmol/L 95   97   CO2 22 - 32 mmol/L 26   25   Calcium 8.9 - 10.3 mg/dL 9.3   08.6   Total Protein 6.5 - 8.1 g/dL 8.3  7.7  7.8   Total Bilirubin 0.3 - 1.2 mg/dL 1.5  1.3  1.0  Alkaline Phos 38 - 126 U/L 80  77  107   AST 15 - 41 U/L 42  39  51   ALT 0 - 44 U/L 45  57  47      No results found for: "CEA1", "CEA" / No results found for: "CEA1", "CEA" No results found for: "PSA1" No results found for: "ZOX096" No results found for: "CAN125"  Lab Results  Component Value Date   ALBUMINELP 4.3 09/12/2022   A1GS 0.3 12/02/2021   A2GS 0.8 12/02/2021   BETS 0.5 12/02/2021   BETA2SER 0.5 12/02/2021   GAMS 1.2 12/02/2021   MSPIKE Not Observed 09/12/2022   SPEI  12/02/2021     Comment:     Normal Serum Protein Electrophoresis Pattern. No abnormal protein bands (M-protein) detected.    Lab Results  Component Value Date   TIBC 324 01/27/2023   TIBC 276 09/02/2021   FERRITIN 263 01/27/2023   FERRITIN 426 (H) 12/22/2022   FERRITIN 260 10/07/2022   IRONPCTSAT 28 01/27/2023   IRONPCTSAT 43 09/02/2021   No results found for: "LDH"   STUDIES:   No results found.    - Hemochromatosis testing negative

## 2023-02-14 ENCOUNTER — Inpatient Hospital Stay (HOSPITAL_BASED_OUTPATIENT_CLINIC_OR_DEPARTMENT_OTHER): Payer: No Typology Code available for payment source | Admitting: Oncology

## 2023-02-14 VITALS — BP 116/63 | HR 75 | Temp 96.8°F | Resp 17 | Wt 156.1 lb

## 2023-02-14 DIAGNOSIS — R7989 Other specified abnormal findings of blood chemistry: Secondary | ICD-10-CM

## 2023-02-14 DIAGNOSIS — E876 Hypokalemia: Secondary | ICD-10-CM | POA: Diagnosis not present

## 2023-02-14 DIAGNOSIS — R768 Other specified abnormal immunological findings in serum: Secondary | ICD-10-CM | POA: Diagnosis not present

## 2023-02-14 MED ORDER — POTASSIUM CHLORIDE CRYS ER 20 MEQ PO TBCR: 20.0000 meq | EXTENDED_RELEASE_TABLET | Freq: Every day | ORAL | 0 refills | Status: DC

## 2023-02-20 ENCOUNTER — Encounter: Payer: Self-pay | Admitting: "Endocrinology

## 2023-02-20 ENCOUNTER — Ambulatory Visit (INDEPENDENT_AMBULATORY_CARE_PROVIDER_SITE_OTHER): Payer: No Typology Code available for payment source | Admitting: "Endocrinology

## 2023-02-20 VITALS — BP 94/56 | HR 64 | Ht 65.5 in | Wt 153.2 lb

## 2023-02-20 DIAGNOSIS — Z7985 Long-term (current) use of injectable non-insulin antidiabetic drugs: Secondary | ICD-10-CM | POA: Diagnosis not present

## 2023-02-20 DIAGNOSIS — R748 Abnormal levels of other serum enzymes: Secondary | ICD-10-CM

## 2023-02-20 DIAGNOSIS — I10 Essential (primary) hypertension: Secondary | ICD-10-CM | POA: Diagnosis not present

## 2023-02-20 DIAGNOSIS — E782 Mixed hyperlipidemia: Secondary | ICD-10-CM | POA: Diagnosis not present

## 2023-02-20 DIAGNOSIS — E1165 Type 2 diabetes mellitus with hyperglycemia: Secondary | ICD-10-CM | POA: Diagnosis not present

## 2023-02-20 MED ORDER — METFORMIN HCL 500 MG PO TABS
500.0000 mg | ORAL_TABLET | Freq: Two times a day (BID) | ORAL | 1 refills | Status: AC
Start: 2023-02-20 — End: ?

## 2023-02-20 NOTE — Patient Instructions (Signed)
                                     Advice for Weight Management  -For most of us the best way to lose weight is by diet management. Generally speaking, diet management means consuming less calories intentionally which over time brings about progressive weight loss.  This can be achieved more effectively by avoiding ultra processed carbohydrates, processed meats, unhealthy fats.    It is critically important to know your numbers: how much calorie you are consuming and how much calorie you need. More importantly, our carbohydrates sources should be unprocessed naturally occurring  complex starch food items.  It is always important to balance nutrition also by  appropriate intake of proteins (mainly plant-based), healthy fats/oils, plenty of fruits and vegetables.   -The American College of Lifestyle Medicine (ACL M) recommends nutrition derived mostly from Whole Food, Plant Predominant Sources example an apple instead of applesauce or apple pie. Eat Plenty of vegetables, Mushrooms, fruits, Legumes, Whole Grains, Nuts, seeds in lieu of processed meats, processed snacks/pastries red meat, poultry, eggs.  Use only water or unsweetened tea for hydration.  The College also recommends the need to stay away from risky substances including alcohol, smoking; obtaining 7-9 hours of restorative sleep, at least 150 minutes of moderate intensity exercise weekly, importance of healthy social connections, and being mindful of stress and seek help when it is overwhelming.    -Sticking to a routine mealtime to eat 3 meals a day and avoiding unnecessary snacks is shown to have a big role in weight control. Under normal circumstances, the only time we burn stored energy is when we are hungry, so allow  some hunger to take place- hunger means no food between appropriate meal times, only water.  It is not advisable to starve.   -It is better to avoid simple carbohydrates including:  Cakes, Sweet Desserts, Ice Cream, Soda (diet and regular), Sweet Tea, Candies, Chips, Cookies, Store Bought Juices, Alcohol in Excess of  1-2 drinks a day, Lemonade,  Artificial Sweeteners, Doughnuts, Coffee Creamers, "Sugar-free" Products, etc, etc.  This is not a complete list.....    -Consulting with certified diabetes educators is proven to provide you with the most accurate and current information on diet.  Also, you may be  interested in discussing diet options/exchanges , we can schedule a visit with Laura Scott, RDN, CDE for individualized nutrition education.  -Exercise: If you are able: 30 -60 minutes a day ,4 days a week, or 150 minutes of moderate intensity exercise weekly.    The longer the better if tolerated.  Combine stretch, strength, and aerobic activities.  If you were told in the past that you have high risk for cardiovascular diseases, or if you are currently symptomatic, you may seek evaluation by your heart doctor prior to initiating moderate to intense exercise programs.                                  Additional Care Considerations for Diabetes/Prediabetes   -Diabetes  is a chronic disease.  The most important care consideration is regular follow-up with your diabetes care provider with the goal being avoiding or delaying its complications and to take advantage of advances in medications and technology.  If appropriate actions are taken early enough, type 2 diabetes can even be   reversed.  Seek information from the right source.  - Whole Food, Plant Predominant Nutrition is highly recommended: Eat Plenty of vegetables, Mushrooms, fruits, Legumes, Whole Grains, Nuts, seeds in lieu of processed meats, processed snacks/pastries red meat, poultry, eggs as recommended by American College of  Lifestyle Medicine (ACLM).  -Type 2 diabetes is known to coexist with other important comorbidities such as high blood pressure and high cholesterol.  It is critical to control not only the  diabetes but also the high blood pressure and high cholesterol to minimize and delay the risk of complications including coronary artery disease, stroke, amputations, blindness, etc.  The good news is that this diet recommendation for type 2 diabetes is also very helpful for managing high cholesterol and high blood blood pressure.  - Studies showed that people with diabetes will benefit from a class of medications known as ACE inhibitors and statins.  Unless there are specific reasons not to be on these medications, the standard of care is to consider getting one from these groups of medications at an optimal doses.  These medications are generally considered safe and proven to help protect the heart and the kidneys.    - People with diabetes are encouraged to initiate and maintain regular follow-up with eye doctors, foot doctors, dentists , and if necessary heart and kidney doctors.     - It is highly recommended that people with diabetes quit smoking or stay away from smoking, and get yearly  flu vaccine and pneumonia vaccine at least every 5 years.  See above for additional recommendations on exercise, sleep, stress management , and healthy social connections.      

## 2023-02-20 NOTE — Progress Notes (Signed)
02/20/2023, 2:27 PM    Endocrinology follow-up note    Subjective:    Patient ID: Laura Scott, female    DOB: 1960/08/28.  Laura Scott is being seen in follow-up after she was seen in consultation for  management of currently controlled symptomatic diabetes requested by  Gilmore Laroche, FNP.   Past Medical History:  Diagnosis Date   Allergic rhinitis    Anxiety    Chronic pain    Depression    Diabetes mellitus without complication (HCC)    GERD (gastroesophageal reflux disease)    Hyperlipidemia    Hypertension    IBS (irritable bowel syndrome)    PMS (premenstrual syndrome)    Reflux     Past Surgical History:  Procedure Laterality Date   BREAST BIOPSY Right 12/07/2022   MM RT BREAST BX W LOC DEV 1ST LESION IMAGE BX SPEC STEREO GUIDE 12/07/2022 [facility]   COLONOSCOPY  08/29/2011   [name]: normal. high risk screening tcs in 5 years.    COLONOSCOPY WITH PROPOFOL N/A 10/20/2016   Procedure: COLONOSCOPY WITH PROPOFOL;  Surgeon: Corbin Ade, MD;  [facility];  Service: Endoscopy;  Laterality: N/A;  10:45am   COLONOSCOPY WITH PROPOFOL N/A 02/09/2022   Procedure: COLONOSCOPY WITH PROPOFOL;  Surgeon: Corbin Ade, MD;  Location: [facility];  Service: Endoscopy;  Laterality: N/A;  2:30 / ASA 2   ESOPHAGOGASTRODUODENOSCOPY (EGD) WITH PROPOFOL N/A 10/20/2016   Procedure: ESOPHAGOGASTRODUODENOSCOPY (EGD) WITH PROPOFOL;  Surgeon: Corbin Ade, MD;  Location: [facility];  Service: Endoscopy;  Laterality: N/A;   LUMBAR DISC SURGERY     MALONEY DILATION N/A 10/20/2016   Procedure: Elease Hashimoto DILATION;  Surgeon: Corbin Ade, MD;  Location: [facility];  Service: Endoscopy;  Laterality: N/A;   POLYPECTOMY  10/20/2016   Procedure: POLYPECTOMY;  Surgeon: Corbin Ade, MD;  [facility]: [facility];  Service: Endoscopy;;  cecal and sigmoid polypectomies    Social History    Socioeconomic History   Marital status: Divorced    Spouse name: Not on file   Number of children: Not on file   Years of education: Not on file   Highest education level: Not on file  Occupational History   Not on file  Tobacco Use   Smoking status: Never    Passive exposure: Current   Smokeless tobacco: Never  Vaping Use   Vaping Use: Never used  Substance and Sexual Activity   Alcohol use: Yes    Comment: occ   Drug use: No   Sexual activity: Yes    Birth control/protection: None, Post-menopausal  Other Topics Concern   Not on file  Social History Narrative   Lives with her brother. Pt is on disability from her lumbar fusion.    Social Determinants of Health   Financial Resource Strain: Not on file  Food Insecurity: Not on file  Transportation Needs: Not on file  Physical Activity: Not on file  Stress: Not on file  Social Connections: Not on file    Family History  Problem Relation Age of Onset   Hypertension Mother  Colon polyps Mother    Hypertension Father    Diabetes Father    Hypertension Sister    Hyperlipidemia Sister    Colon polyps Brother    Arthritis Brother    Diabetes Maternal Aunt    Hypertension Maternal Aunt    Rheum arthritis Maternal Grandmother    Prostate cancer Maternal Grandfather    Cancer Paternal Grandfather        not sure of type of cancer   Breast cancer Neg Hx    Cervical cancer Neg Hx     Outpatient Encounter Medications as of 02/20/2023  Medication Sig   Semaglutide,0.25 or 0.5MG /DOS, (OZEMPIC, 0.25 OR 0.5 MG/DOSE,) 2 MG/1.5ML SOPN Inject 0.25 mg into the skin once a week.   albuterol (VENTOLIN HFA) 108 (90 Base) MCG/ACT inhaler Inhale 1-2 puffs into the lungs every 6 (six) hours as needed for wheezing or shortness of breath.   atorvastatin (LIPITOR) 40 MG tablet Take 1 tablet (40 mg total) by mouth daily.   benzonatate (TESSALON) 100 MG capsule Take 1 capsule (100 mg total) by mouth 3 (three) times daily as needed for  cough. Do not take with alcohol or while driving or operating heavy machinery.  May cause drowsiness.   budesonide-formoterol (SYMBICORT) 160-4.5 MCG/ACT inhaler Inhale 2 puffs into the lungs 2 (two) times daily. (Patient taking differently: Inhale 2 puffs into the lungs daily as needed (Shortness of breath).)   Cholecalciferol (VITAMIN D) 125 MCG (5000 UT) CAPS Take 5,000 Units by mouth daily with breakfast.   diclofenac Sodium (VOLTAREN) 1 % GEL Apply 2-4 grams to affected joint 4 times daily as needed.   escitalopram (LEXAPRO) 20 MG tablet Take 1 tablet (20 mg total) by mouth daily.   fluticasone (FLONASE) 50 MCG/ACT nasal spray Place 2 sprays into both nostrils daily. (Patient taking differently: Place 2 sprays into both nostrils daily as needed for allergies.)   glucose blood (ACCU-CHEK GUIDE) test strip CHECK BLOOD SUGAR ONCE  DAILY   hydrochlorothiazide (HYDRODIURIL) 25 MG tablet TAKE 1 TABLET BY MOUTH ONCE DAILY AS NEEDED   hydrOXYzine (VISTARIL) 50 MG capsule TAKE (1) CAPSULE BY MOUTH AT BEDTIME AS NEEDED.   levocetirizine (XYZAL) 5 MG tablet Take 1 tablet (5 mg total) by mouth every evening.   losartan (COZAAR) 50 MG tablet Take 1 tablet (50 mg total) by mouth daily.   metFORMIN (GLUCOPHAGE) 500 MG tablet Take 1 tablet (500 mg total) by mouth 2 (two) times daily after a meal.   Omega-3 Fatty Acids (FISH OIL OMEGA-3) 1000 MG CAPS Take 1 g by mouth daily.   omeprazole (PRILOSEC) 40 MG capsule TAKE 1 CAPSULE BY MOUTH DAILY.   potassium chloride SA (KLOR-CON M) 20 MEQ tablet Take 1 tablet (20 mEq total) by mouth daily.   Probiotic Product (PROBIOTIC DAILY PO) Take by mouth.   promethazine-dextromethorphan (PROMETHAZINE-DM) 6.25-15 MG/5ML syrup Take 5 mLs by mouth 4 (four) times daily as needed for cough.   tiZANidine (ZANAFLEX) 4 MG tablet TAKE (1) TABLET BY MOUTH EVERY SIX HOURS AS NEEDED FOR MUSCLE SPASMS.   [DISCONTINUED] metFORMIN (GLUCOPHAGE) 500 MG tablet Take 1 tablet (500 mg total) by  mouth daily.   [DISCONTINUED] tirzepatide (MOUNJARO) 7.5 MG/0.5ML Pen Inject 7.5 mg into the skin once a week.   No facility-administered encounter medications on file as of 02/20/2023.    ALLERGIES: Allergies  Allergen Reactions   Sulfa Antibiotics Hives, Diarrhea and Itching   Other     SURGICAL TAPE Reaction: rash  Penicillins Hives, Itching and Rash    Pt states that she is able to take PCN  Has patient had a PCN reaction causing immediate rash, facial/tongue/throat swelling, SOB or lightheadedness with hypotension: Yes Has patient had a PCN reaction causing severe rash involving mucus membranes or skin necrosis: No Has patient had a PCN reaction that required hospitalization: No Has patient had a PCN reaction occurring within the last 10 years: Yes If all of the above answers are "NO", then may proceed with Cephalosporin use.    VACCINATION STATUS: Immunization History  Administered Date(s) Administered   Influenza,inj,Quad PF,6+ Mos 05/31/2018, 08/07/2021, 11/12/2021   Influenza-Unspecified 05/23/2011   Moderna Covid-19 Vaccine Bivalent Booster 56yrs & up 09/07/2021   Moderna Sars-Covid-2 Vaccination 11/02/2019, 12/04/2019, 08/06/2020   Pneumococcal Polysaccharide-23 08/20/2018    Diabetes She presents for her follow-up diabetic visit. She has type 2 diabetes mellitus. Onset time: She was diagnosed at approximate age of 35 years. Her disease course has been worsening. There are no hypoglycemic associated symptoms. Pertinent negatives for hypoglycemia include no confusion, headaches, pallor or seizures. There are no diabetic associated symptoms. Pertinent negatives for diabetes include no chest pain, no polydipsia, no polyphagia and no polyuria. There are no hypoglycemic complications. Symptoms are worsening. There are no diabetic complications. Risk factors for coronary artery disease include diabetes mellitus, dyslipidemia, family history, hypertension, post-menopausal and  sedentary lifestyle. Current diabetic treatment includes oral agent (monotherapy). Her weight is fluctuating minimally. She is following a generally unhealthy diet. When asked about meal planning, she reported none. She has not had a previous visit with a dietitian. She rarely participates in exercise. Her home blood glucose trend is fluctuating minimally. Her breakfast blood glucose range is generally 110-130 mg/dl. Her overall blood glucose range is 110-130 mg/dl. (She presents with loss of control of glycemia with previsit labs showing A1c of 8.5%, increasing from 6.2%.  Her meter shows average fasting blood glucose of 119 for the last 30 days.  ) An ACE inhibitor/angiotensin II receptor blocker is being taken. She does not see a podiatrist.Eye exam is current.  Hyperlipidemia This is a chronic problem. The current episode started more than 1 year ago. The problem is uncontrolled. Recent lipid tests were reviewed and are variable. Exacerbating diseases include diabetes. Factors aggravating her hyperlipidemia include fatty foods. Pertinent negatives include no chest pain, myalgias or shortness of breath. Current antihyperlipidemic treatment includes statins. Risk factors for coronary artery disease include dyslipidemia, diabetes mellitus, hypertension, a sedentary lifestyle and post-menopausal.  Hypertension This is a chronic problem. The current episode started more than 1 year ago. The problem has been resolved since onset. The problem is controlled. Pertinent negatives include no chest pain, headaches, palpitations or shortness of breath. Risk factors for coronary artery disease include dyslipidemia, diabetes mellitus, family history, sedentary lifestyle and post-menopausal state. Past treatments include diuretics and angiotensin blockers. The current treatment provides moderate improvement. There are no compliance problems.    Review of systems  Constitutional: + Minimally fluctuating body weight,   current Body mass index is 25.11 kg/m. , no fatigue, no subjective hyperthermia, no subjective hypothermia  Respiratory: no cough, no shortness of breath Gastrointestinal: no nausea/vomiting, + diarrhea   Objective:    BP (!) 94/56   Pulse 64   Ht 5' 5.5" (1.664 m)   Wt 153 lb 3.2 oz (69.5 kg)   LMP 09/03/2013   BMI 25.11 kg/m   Wt Readings from Last 3 Encounters:  02/20/23 153 lb 3.2 oz (69.5  kg)  02/14/23 156 lb 1.4 oz (70.8 kg)  01/27/23 154 lb 6.4 oz (70 kg)     BP Readings from Last 3 Encounters:  02/20/23 (!) 94/56  02/14/23 116/63  01/27/23 139/67     Physical Exam- Limited  Constitutional:  Body mass index is 25.11 kg/m. , not in acute distress, normal state of mind  CMP     Component Value Date/Time   NA 133 (L) 01/27/2023 1115   NA 139 12/01/2022 1129   K 2.9 (L) 01/27/2023 1115   CL 95 (L) 01/27/2023 1115   CO2 26 01/27/2023 1115   GLUCOSE 123 (H) 01/27/2023 1115   BUN 10 01/27/2023 1115   BUN 9 12/01/2022 1129   CREATININE 0.89 01/27/2023 1115   CREATININE 1.03 12/23/2019 1036   CALCIUM 9.3 01/27/2023 1115   PROT 8.3 (H) 01/27/2023 1115   PROT 7.8 12/01/2022 1129   ALBUMIN 4.5 01/27/2023 1115   ALBUMIN 4.7 12/01/2022 1129   AST 42 (H) 01/27/2023 1115   ALT 45 (H) 01/27/2023 1115   ALKPHOS 80 01/27/2023 1115   BILITOT 1.5 (H) 01/27/2023 1115   BILITOT 1.0 12/01/2022 1129   GFRNONAA >60 01/27/2023 1115   GFRNONAA 60 12/23/2019 1036   GFRAA 69 12/23/2019 1036     Diabetic Labs (most recent): Lab Results  Component Value Date   HGBA1C 8.5 (H) 12/01/2022   HGBA1C 6.2 08/19/2022   HGBA1C 5.9 12/09/2021   MICROALBUR 80 08/31/2020   MICROALBUR 2.8 12/23/2019     Lipid Panel ( most recent) Lipid Panel     Component Value Date/Time   CHOL 172 12/01/2022 1129   TRIG 151 (H) 12/01/2022 1129   HDL 40 12/01/2022 1129   CHOLHDL 4.3 12/01/2022 1129   CHOLHDL 3.9 09/10/2014 0805   VLDL 25 09/10/2014 0805   LDLCALC 105 (H) 12/01/2022 1129    LABVLDL 27 12/01/2022 1129     Assessment & Plan:   1) Type 2 diabetes mellitus with hyperglycemia, without long-term current use of insulin (HCC)  - Lollie Marrow Sytsma has currently uncontrolled symptomatic type 2 DM since  62 years of age.  She presents with loss of control of glycemia with previsit labs showing A1c of 8.5%, increasing from 6.2%.  Her meter shows average fasting blood glucose of 119 for the last 30 days.     Recent labs reviewed.  - I had a long discussion with her about the progressive nature of diabetes and the pathology behind its complications. -She does not report any gross complications from her diabetes, however, she remains at a high risk for more acute and chronic complications which include CAD, CVA, CKD, retinopathy, and neuropathy. These are all discussed in detail with her.  - Nutritional counseling repeated at each appointment due to patients tendency to fall back in to old habits.  - she acknowledges that there is a room for improvement in her food and drink choices. - Suggestion is made for her to avoid simple carbohydrates  from her diet including Cakes, Sweet Desserts, Ice Cream, Soda (diet and regular), Sweet Tea, Candies, Chips, Cookies, Store Bought Juices, Alcohol , Artificial Sweeteners,  Coffee Creamer, and "Sugar-free" Products, Lemonade. This will help patient to have more stable blood glucose profile and potentially avoid unintended weight gain.  The following Lifestyle Medicine recommendations according to American College of Lifestyle Medicine  [facility]) were discussed and and offered to patient and she  agrees to start the journey:  A. Whole Foods, Plant-Based  Nutrition comprising of fruits and vegetables, plant-based proteins, whole-grain carbohydrates was discussed in detail with the patient.   A list for source of those nutrients were also provided to the patient.  Patient will use only water or unsweetened tea for hydration. B.  The need to  stay away from risky substances including alcohol, smoking; obtaining 7 to 9 hours of restorative sleep, at least 150 minutes of moderate intensity exercise weekly, the importance of healthy social connections,  and stress management techniques were discussed. C.  A full color page of  Calorie density of various food groups per pound showing examples of each food groups was provided to the patient.   - she has been scheduled with Norm Salt, RDN, CDE for diabetes education.  - I have approached her with the following individualized plan to manage  her diabetes and patient agrees:   -In light of her presentation with loss of control of diabetes, she would need medication intervention.  Accordingly, she is advised to increase her metformin to 500 mg p.o. twice daily-after breakfast and after supper.    -She is also on Mounjaro which she states will not afford.  She was switched to Ozempic starting from 0.25 mg subcutaneously weekly.  This medication will be advanced as tolerated.  Side effects and precautions discussed with her.     She  is advised to monitor blood glucose at least 2 times daily-daily before breakfast and at bedtime.    - Specific targets for  A1c;  LDL, HDL,  and Triglycerides were discussed with the patient.  2) Blood Pressure /Hypertension:  -Her blood pressure is controlled to target she is advised to continue her current meds including HCTZ 25 mg p.o. daily with breakfast .  She is also on Losartan 50 mg p.o. daily.  3) Lipids/Hyperlipidemia:    Review of her previsit lipid panel showed slight improvement in her LDL to 105 from 115.  She is advised to continue her atorvastatin 20 mg p.o. nightly.    She will also benefit from whole food plant-based diet discussed above. She does have elevated liver enzymes which could be a sign of fatty liver.  This is discussed with her the potential risk for liver failure.  If her ferritin remains high, she will need a consult with  hematology.   4)  Weight/Diet:   Her Body mass index is 25.11 kg/m.  -   she is nontachycardic for major weight loss.   I discussed with her the fact that loss of 5 - 10% of her  current body weight will have the most impact on her diabetes management.  Exercise, and detailed carbohydrates information provided  -  detailed on discharge instructions.  5) Chronic Care/Health Maintenance: -she  is on ARB and statin medications and  is encouraged to initiate and continue to follow up with Ophthalmology, Dentist,  Podiatrist at least yearly or according to recommendations, and advised to stay away from smoking. I have recommended yearly flu vaccine and pneumonia vaccine at least every 5 years; moderate intensity exercise for up to 150 minutes weekly; and  sleep for at least 7 hours a day.   - she is  advised to maintain close follow up with Gilmore Laroche, FNP for primary care needs, as well as her other providers for optimal and coordinated care.   I spent  26  minutes in the care of the patient today including review of labs from CMP, Lipids, Thyroid Function, Hematology (current and previous  including abstractions from other facilities); face-to-face time discussing  her blood glucose readings/logs, discussing hypoglycemia and hyperglycemia episodes and symptoms, medications doses, her options of short and long term treatment based on the latest standards of care / guidelines;  discussion about incorporating lifestyle medicine;  and documenting the encounter. Risk reduction counseling performed per USPSTF guidelines to reduce  cardiovascular risk factors.     Please refer to Patient Instructions for Blood Glucose Monitoring and Insulin/Medications Dosing Guide"  in media tab for additional information. Please  also refer to " Patient Self Inventory" in the Media  tab for reviewed elements of pertinent patient history.  Laura Scott participated in the discussions, expressed understanding, and  voiced agreement with the above plans.  All questions were answered to her satisfaction. she is encouraged to contact clinic should she have any questions or concerns prior to her return visit.     Follow up plan: - Return in about 3 months (around 05/23/2023) for F/U with Pre-visit Labs, Meter/CGM/Logs, A1c here.  Ronny Bacon, [facility] [address] [place], [place] [address] Phone: [phone] Fax: [phone]  02/20/2023, 2:27 PM

## 2023-03-14 ENCOUNTER — Inpatient Hospital Stay: Payer: No Typology Code available for payment source | Attending: Hematology

## 2023-03-14 DIAGNOSIS — R7989 Other specified abnormal findings of blood chemistry: Secondary | ICD-10-CM | POA: Diagnosis not present

## 2023-03-14 DIAGNOSIS — R768 Other specified abnormal immunological findings in serum: Secondary | ICD-10-CM | POA: Insufficient documentation

## 2023-03-14 DIAGNOSIS — E876 Hypokalemia: Secondary | ICD-10-CM | POA: Insufficient documentation

## 2023-03-14 LAB — POTASSIUM: Potassium: 3.5 mmol/L (ref 3.5–5.1)

## 2023-03-15 ENCOUNTER — Other Ambulatory Visit: Payer: Self-pay | Admitting: Family Medicine

## 2023-03-15 DIAGNOSIS — E1165 Type 2 diabetes mellitus with hyperglycemia: Secondary | ICD-10-CM

## 2023-03-15 DIAGNOSIS — M62838 Other muscle spasm: Secondary | ICD-10-CM

## 2023-03-15 DIAGNOSIS — G479 Sleep disorder, unspecified: Secondary | ICD-10-CM

## 2023-03-20 ENCOUNTER — Other Ambulatory Visit: Payer: Self-pay

## 2023-03-20 DIAGNOSIS — R7989 Other specified abnormal findings of blood chemistry: Secondary | ICD-10-CM

## 2023-03-21 ENCOUNTER — Inpatient Hospital Stay (HOSPITAL_BASED_OUTPATIENT_CLINIC_OR_DEPARTMENT_OTHER): Payer: No Typology Code available for payment source | Admitting: Oncology

## 2023-03-21 DIAGNOSIS — E876 Hypokalemia: Secondary | ICD-10-CM | POA: Diagnosis not present

## 2023-03-21 MED ORDER — POTASSIUM CHLORIDE CRYS ER 20 MEQ PO TBCR: 20.0000 meq | EXTENDED_RELEASE_TABLET | Freq: Every day | ORAL | 0 refills | Status: DC

## 2023-03-21 NOTE — Progress Notes (Signed)
Virtual Visit via Telephone Note  I connected with [name] on 03/21/23 at  2:30 PM EDT by telephone and verified that I am speaking with the correct person using two identifiers.  Location: Patient: Home Provider: Clinic   I discussed the limitations, risks, security and privacy concerns of performing an evaluation and management service by telephone and the availability of in person appointments. I also discussed with the patient that there may be a patient responsible charge related to this service. The patient expressed understanding and agreed to proceed.   History of Present Illness: [name] is a 62 year old female who is followed by hematology/oncology for elevated ferritin and IgA levels.  During workup, she was found to have a positive ANA and low potassium levels.  She was started on oral potassium and had labs drawn a few days ago for follow-up.  Today, she reports she feels tired but overall well.  She is currently on Mounjaro for weight loss is not eating much.  She is currently lost about 40 pounds.  Observations/Objective: Review of Systems  Constitutional:  Positive for malaise/fatigue. Negative for chills, fever and weight loss.  HENT:  Negative for congestion, ear pain and tinnitus.   Eyes: Negative.  Negative for blurred vision and double vision.  Respiratory: Negative.  Negative for cough, sputum production and shortness of breath.   Cardiovascular: Negative.  Negative for chest pain, palpitations and leg swelling.  Gastrointestinal: Negative.  Negative for abdominal pain, constipation, diarrhea, nausea and vomiting.  Genitourinary:  Negative for dysuria, frequency and urgency.  Musculoskeletal:  Negative for back pain and falls.  Skin: Negative.  Negative for rash.  Neurological: Negative.  Negative for weakness and headaches.  Endo/Heme/Allergies: Negative.  Does not bruise/bleed easily.  Psychiatric/Behavioral: Negative.  Negative for depression. The  patient is not nervous/anxious and does not have insomnia.    Physical Exam Neurological:     Mental Status: She is alert and oriented to person, place, and time.    Assessment and Plan: 1. Hypokalemia -Labs from 03/14/2023 showed potassium level of 3.5. -Recommend she continue potassium 20 mill equivalents once daily. -Recheck labs in 4 to 6 weeks.  Follow Up Instructions: -Recheck labs in 4 to 6 weeks.    I discussed the assessment and treatment plan with the patient. The patient was provided an opportunity to ask questions and all were answered. The patient agreed with the plan and demonstrated an understanding of the instructions.   The patient was advised to call back or seek an in-person evaluation if the symptoms worsen or if the condition fails to improve as anticipated.  I provided 12 minutes of non-face-to-face time during this encounter.   Mauro Kaufmann, NP

## 2023-03-27 DIAGNOSIS — R7989 Other specified abnormal findings of blood chemistry: Secondary | ICD-10-CM | POA: Diagnosis not present

## 2023-03-27 NOTE — Telephone Encounter (Signed)
Patient came back by the office needs page 2 and page 4 completely resent to [facility] [phone] and patient to pick up a copy. They only received the odd pages no page 2 or page 4 received.

## 2023-03-29 ENCOUNTER — Encounter: Payer: Self-pay | Admitting: Family Medicine

## 2023-03-29 ENCOUNTER — Ambulatory Visit (INDEPENDENT_AMBULATORY_CARE_PROVIDER_SITE_OTHER): Payer: No Typology Code available for payment source | Admitting: Family Medicine

## 2023-03-29 VITALS — BP 124/62 | HR 84 | Ht 65.5 in | Wt 154.1 lb

## 2023-03-29 DIAGNOSIS — K219 Gastro-esophageal reflux disease without esophagitis: Secondary | ICD-10-CM

## 2023-03-29 DIAGNOSIS — I10 Essential (primary) hypertension: Secondary | ICD-10-CM | POA: Diagnosis not present

## 2023-03-29 DIAGNOSIS — R7301 Impaired fasting glucose: Secondary | ICD-10-CM

## 2023-03-29 DIAGNOSIS — Z7984 Long term (current) use of oral hypoglycemic drugs: Secondary | ICD-10-CM

## 2023-03-29 DIAGNOSIS — F419 Anxiety disorder, unspecified: Secondary | ICD-10-CM | POA: Diagnosis not present

## 2023-03-29 DIAGNOSIS — F410 Panic disorder [episodic paroxysmal anxiety] without agoraphobia: Secondary | ICD-10-CM | POA: Diagnosis not present

## 2023-03-29 DIAGNOSIS — F32A Depression, unspecified: Secondary | ICD-10-CM

## 2023-03-29 DIAGNOSIS — E1165 Type 2 diabetes mellitus with hyperglycemia: Secondary | ICD-10-CM | POA: Diagnosis not present

## 2023-03-29 DIAGNOSIS — E7849 Other hyperlipidemia: Secondary | ICD-10-CM

## 2023-03-29 DIAGNOSIS — E782 Mixed hyperlipidemia: Secondary | ICD-10-CM

## 2023-03-29 DIAGNOSIS — E559 Vitamin D deficiency, unspecified: Secondary | ICD-10-CM

## 2023-03-29 DIAGNOSIS — E038 Other specified hypothyroidism: Secondary | ICD-10-CM

## 2023-03-29 MED ORDER — ESCITALOPRAM OXALATE 20 MG PO TABS: 20.0000 mg | ORAL_TABLET | Freq: Every day | ORAL | 1 refills | Status: DC

## 2023-03-29 MED ORDER — ALPRAZOLAM 0.25 MG PO TABS
0.2500 mg | ORAL_TABLET | ORAL | 0 refills | Status: DC | PRN
Start: 2023-03-29 — End: 2023-04-25

## 2023-03-29 MED ORDER — OMEPRAZOLE 40 MG PO CPDR
40.0000 mg | DELAYED_RELEASE_CAPSULE | Freq: Every day | ORAL | 0 refills | Status: DC
Start: 2023-03-29 — End: 2023-07-31

## 2023-03-29 NOTE — Assessment & Plan Note (Signed)
She takes Mounjaro 7.5 mg weekly metformin 500 mg twice daily Denies polyuria, polyphagia, polydipsia Encouraged decreasing her intake of high sugar foods and beverages with increase physical activity Hemoglobin A1c Lab Results  Component Value Date   HGBA1C 8.5 (H) 12/01/2022

## 2023-03-29 NOTE — Patient Instructions (Addendum)
I appreciate the opportunity to provide care to you today!    Follow up:  4 months  Labs: please stop by the lab during the week to get your blood drawn (CBC, CMP, TSH, Lipid profile, HgA1c, Vit D)  Please schedule diabetic eye exam on 04/12/23  Nonpharmacologic management of anxiety and depression  Mindfulness and Meditation Practices like mindfulness meditation can help reduce symptoms by promoting relaxation and present-moment awareness.  Exercise  Regular physical activity has been shown to improve mood and reduce anxiety through the release of endorphins and other neurochemicals.  Healthy Diet Eating a balanced diet rich in fruits, vegetables, whole grains, and lean proteins can support overall mental health.  Sleep Hygiene  Establishing a regular sleep routine and ensuring good sleep quality can significantly impact mood and anxiety levels.  Stress Management Techniques Activities such as yoga, tai chi, and deep breathing exercises can help manage stress.  Social Support Maintaining strong relationships and seeking support from friends, family, or support groups can provide emotional comfort and reduce feelings of isolation.  Lifestyle Modifications Reducing alcohol and caffeine intake, quitting smoking, and avoiding recreational drugs can improve symptoms.  Art and Music Therapy Engaging in creative activities like painting, drawing, or playing music can be therapeutic and help express emotions.  Light Therapy Particularly useful for seasonal affective disorder (SAD), exposure to bright light can help regulate mood. Marland Kitchen   Referrals today- Integrated behavioral Health  Attached with your AVS, you will find valuable resources for self-education. I highly recommend dedicating some time to thoroughly examine them.   Please continue to a heart-healthy diet and increase your physical activities. Try to exercise for at least five days a week.    It was a pleasure to see you  and I look forward to continuing to work together on your health and well-being. Please do not hesitate to call the office if you need care or have questions about your care.  In case of emergency, please visit the Emergency Department for urgent care, or contact our clinic at [phone] to schedule an appointment. We're here to help you!   Have a wonderful day and week. With Gratitude, Gilmore Laroche MSN, FNP-BC

## 2023-03-29 NOTE — Assessment & Plan Note (Signed)
She takes Lipitor 20 mg daily and omega-3 fatty acid 1000 mg capsule daily She denies muscle aches and pain Encouraged to increase her intake of foods rich in fruits, vegetables, whole grains Lean proteins: chicken, fish, beans, legumes Low Fat dairy products Reduced intake of saturated fats, trans fatty acids, cholesterol Aim to be active at least 5 days a week for 30 minutes each day ( walking briskly)  Will assess lipid panel today Lab Results  Component Value Date   CHOL 172 12/01/2022   HDL 40 12/01/2022   LDLCALC 105 (H) 12/01/2022   TRIG 151 (H) 12/01/2022   CHOLHDL 4.3 12/01/2022

## 2023-03-29 NOTE — Assessment & Plan Note (Signed)
Controlled She takes losartan 50 mg daily She denies headaches, dizziness, blurred vision, chest pain, palpitation, and shortness of breath Encouraged low-sodium diet with increased physical activity BP Readings from Last 3 Encounters:  03/29/23 124/62  02/20/23 (!) 94/56  02/14/23 116/63

## 2023-03-29 NOTE — Progress Notes (Signed)
Established Patient Office Visit  Subjective:  Patient ID: Laura Scott, female    DOB: 07-May-1961  Age: 62 y.o. MRN: [id number]  CC:  Chief Complaint  Patient presents with   Care Management    3 month f/u   Panic Attack    Pt reports sx of panic attacks when around big crowds also having family issues that are causing her to be anxious.     HPI Laura Scott is a 62 y.o. female with past medical history of hypertension, type 2 diabetes, hyperlipidemia and anxiety and depression presents for f/u of  chronic medical conditions. For the details of today's visit, please refer to the assessment and plan.     Past Medical History:  Diagnosis Date   Allergic rhinitis    Anxiety    Chronic pain    Depression    Diabetes mellitus without complication (HCC)    GERD (gastroesophageal reflux disease)    Hyperlipidemia    Hypertension    IBS (irritable bowel syndrome)    PMS (premenstrual syndrome)    Reflux     Past Surgical History:  Procedure Laterality Date   BREAST BIOPSY Right 12/07/2022   MM RT BREAST BX W LOC DEV 1ST LESION IMAGE BX SPEC STEREO GUIDE 12/07/2022 [facility]   COLONOSCOPY  08/29/2011   [name]: normal. high risk screening tcs in 5 years.    COLONOSCOPY WITH PROPOFOL N/A 10/20/2016   Procedure: COLONOSCOPY WITH PROPOFOL;  Surgeon: Corbin Ade, MD;  Location: [facility];  Service: Endoscopy;  Laterality: N/A;  10:45am   COLONOSCOPY WITH PROPOFOL N/A 02/09/2022   Procedure: COLONOSCOPY WITH PROPOFOL;  Surgeon: Corbin Ade, MD;  Location: [facility];  Service: Endoscopy;  Laterality: N/A;  2:30 / ASA 2   ESOPHAGOGASTRODUODENOSCOPY (EGD) WITH PROPOFOL N/A 10/20/2016   Procedure: ESOPHAGOGASTRODUODENOSCOPY (EGD) WITH PROPOFOL;  Surgeon: Corbin Ade, MD;  Location: [facility];  Service: Endoscopy;  Laterality: N/A;   LUMBAR DISC SURGERY     MALONEY DILATION N/A 10/20/2016   Procedure: Elease Hashimoto DILATION;  Surgeon: Corbin Ade, MD;  Location:  [facility];  Service: Endoscopy;  Laterality: N/A;   POLYPECTOMY  10/20/2016   Procedure: POLYPECTOMY;  Surgeon: Corbin Ade, MD;  Location: [facility];  Service: Endoscopy;;  cecal and sigmoid polypectomies    Family History  Problem Relation Age of Onset   Hypertension Mother    Colon polyps Mother    Hypertension Father    Diabetes Father    Hypertension Sister    Hyperlipidemia Sister    Colon polyps Brother    Arthritis Brother    Diabetes Maternal Aunt    Hypertension Maternal Aunt    Rheum arthritis Maternal Grandmother    Prostate cancer Maternal Grandfather    Cancer Paternal Grandfather        not sure of type of cancer   Breast cancer Neg Hx    Cervical cancer Neg Hx     Social History   Socioeconomic History   Marital status: Divorced    Spouse name: Not on file   Number of children: Not on file   Years of education: Not on file   Highest education level: Not on file  Occupational History   Not on file  Tobacco Use   Smoking status: Never    Passive exposure: Current   Smokeless tobacco: Never  Vaping Use   Vaping status: Never Used  Substance and Sexual  Activity   Alcohol use: Yes    Comment: occ   Drug use: No   Sexual activity: Yes    Birth control/protection: None, Post-menopausal  Other Topics Concern   Not on file  Social History Narrative   Lives with her brother. Pt is on disability from her lumbar fusion.    Social Determinants of Health   Financial Resource Strain: Not on file  Food Insecurity: Not on file  Transportation Needs: Not on file  Physical Activity: Not on file  Stress: Not on file  Social Connections: Not on file  Intimate Partner Violence: Not on file    Outpatient Medications Prior to Visit  Medication Sig Dispense Refill   albuterol (VENTOLIN HFA) 108 (90 Base) MCG/ACT inhaler Inhale 1-2 puffs into the lungs every 6 (six) hours as needed for wheezing or shortness of breath. 18 g 0   atorvastatin (LIPITOR) 40  MG tablet Take 1 tablet (40 mg total) by mouth daily. 90 tablet 3   budesonide-formoterol (SYMBICORT) 160-4.5 MCG/ACT inhaler Inhale 2 puffs into the lungs 2 (two) times daily. (Patient taking differently: Inhale 2 puffs into the lungs daily as needed (Shortness of breath).) 1 Inhaler 5   Cholecalciferol (VITAMIN D) 125 MCG (5000 UT) CAPS Take 5,000 Units by mouth daily with breakfast. 30 capsule 2   diclofenac Sodium (VOLTAREN) 1 % GEL Apply 2-4 grams to affected joint 4 times daily as needed. 400 g 2   fluticasone (FLONASE) 50 MCG/ACT nasal spray Place 2 sprays into both nostrils daily. (Patient taking differently: Place 2 sprays into both nostrils daily as needed for allergies.) 48 g 5   glucose blood (ACCU-CHEK GUIDE) test strip CHECK BLOOD SUGAR ONCE  DAILY 100 strip 3   hydrochlorothiazide (HYDRODIURIL) 25 MG tablet TAKE 1 TABLET BY MOUTH ONCE DAILY AS NEEDED 90 tablet 1   hydrOXYzine (VISTARIL) 50 MG capsule TAKE (1) CAPSULE BY MOUTH AT BEDTIME AS NEEDED. 30 capsule 0   losartan (COZAAR) 50 MG tablet Take 1 tablet (50 mg total) by mouth daily. 90 tablet 1   metFORMIN (GLUCOPHAGE) 500 MG tablet Take 1 tablet (500 mg total) by mouth 2 (two) times daily after a meal. 180 tablet 1   MOUNJARO 7.5 MG/0.5ML Pen INJECT 7.5 MG INTO THE SKIN ONCE WEEKLY 2 mL 0   Omega-3 Fatty Acids (FISH OIL OMEGA-3) 1000 MG CAPS Take 1 g by mouth daily. 90 capsule 1   potassium chloride SA (KLOR-CON M) 20 MEQ tablet Take 1 tablet (20 mEq total) by mouth daily. 60 tablet 0   Probiotic Product (PROBIOTIC DAILY PO) Take by mouth.     tiZANidine (ZANAFLEX) 4 MG tablet TAKE (1) TABLET BY MOUTH EVERY SIX HOURS AS NEEDED FOR MUSCLE SPASMS. 30 tablet 0   benzonatate (TESSALON) 100 MG capsule Take 1 capsule (100 mg total) by mouth 3 (three) times daily as needed for cough. Do not take with alcohol or while driving or operating heavy machinery.  May cause drowsiness. 21 capsule 0   escitalopram (LEXAPRO) 20 MG tablet Take 1  tablet (20 mg total) by mouth daily. 90 tablet 1   omeprazole (PRILOSEC) 40 MG capsule TAKE 1 CAPSULE BY MOUTH DAILY. 90 capsule 0   promethazine-dextromethorphan (PROMETHAZINE-DM) 6.25-15 MG/5ML syrup Take 5 mLs by mouth 4 (four) times daily as needed for cough. 118 mL 0   No facility-administered medications prior to visit.    Allergies  Allergen Reactions   Sulfa Antibiotics Hives, Diarrhea and Itching   Other  SURGICAL TAPE Reaction: rash    Penicillins Hives, Itching and Rash    Pt states that she is able to take PCN  Has patient had a PCN reaction causing immediate rash, facial/tongue/throat swelling, SOB or lightheadedness with hypotension: Yes Has patient had a PCN reaction causing severe rash involving mucus membranes or skin necrosis: No Has patient had a PCN reaction that required hospitalization: No Has patient had a PCN reaction occurring within the last 10 years: Yes If all of the above answers are "NO", then may proceed with Cephalosporin use.    ROS Review of Systems  Constitutional:  Negative for chills and fever.  Eyes:  Negative for visual disturbance.  Respiratory:  Negative for chest tightness and shortness of breath.   Neurological:  Negative for dizziness and headaches.      Objective:    Physical Exam HENT:     Head: Normocephalic.     Mouth/Throat:     Mouth: Mucous membranes are moist.  Cardiovascular:     Rate and Rhythm: Normal rate.     Heart sounds: Normal heart sounds.  Pulmonary:     Effort: Pulmonary effort is normal.     Breath sounds: Normal breath sounds.  Neurological:     Mental Status: She is alert.     BP 124/62 (BP Location: Left Arm)   Pulse 84   Ht 5' 5.5" (1.664 m)   Wt 154 lb 1.3 oz (69.9 kg)   LMP 09/03/2013   SpO2 97%   BMI 25.25 kg/m  Wt Readings from Last 3 Encounters:  03/29/23 154 lb 1.3 oz (69.9 kg)  02/20/23 153 lb 3.2 oz (69.5 kg)  02/14/23 156 lb 1.4 oz (70.8 kg)    Lab Results  Component Value  Date   TSH 1.950 12/01/2022   Lab Results  Component Value Date   WBC 6.9 12/01/2022   HGB 13.2 12/01/2022   HCT 38.2 12/01/2022   MCV 92 12/01/2022   PLT 363 12/01/2022   Lab Results  Component Value Date   NA 133 (L) 01/27/2023   K 3.5 03/14/2023   CO2 26 01/27/2023   GLUCOSE 123 (H) 01/27/2023   BUN 10 01/27/2023   CREATININE 0.89 01/27/2023   BILITOT 0.5 03/27/2023   ALKPHOS 78 03/27/2023   AST 24 03/27/2023   ALT 27 03/27/2023   PROT 7.0 03/27/2023   ALBUMIN 4.4 03/27/2023   CALCIUM 9.3 01/27/2023   ANIONGAP 12 01/27/2023   EGFR 60 12/01/2022   Lab Results  Component Value Date   CHOL 172 12/01/2022   Lab Results  Component Value Date   HDL 40 12/01/2022   Lab Results  Component Value Date   LDLCALC 105 (H) 12/01/2022   Lab Results  Component Value Date   TRIG 151 (H) 12/01/2022   Lab Results  Component Value Date   CHOLHDL 4.3 12/01/2022   Lab Results  Component Value Date   HGBA1C 8.5 (H) 12/01/2022      Assessment & Plan:  Type 2 diabetes mellitus with hyperglycemia, without long-term current use of insulin (HCC) Assessment & Plan: She takes Mounjaro 7.5 mg weekly metformin 500 mg twice daily Denies polyuria, polyphagia, polydipsia Encouraged decreasing her intake of high sugar foods and beverages with increase physical activity Hemoglobin A1c Lab Results  Component Value Date   HGBA1C 8.5 (H) 12/01/2022      Anxiety -     Escitalopram Oxalate; Take 1 tablet (20 mg total) by mouth daily.  Dispense:  90 tablet; Refill: 1 -     [facility] ref to [facility]  Anxiety and depression Assessment & Plan: GAD-7 is 11 PHQ-9 is 11 Denies suicidal thoughts evaluation Reports increase anxiety and panic attacks as of recently He is interested in speaking with a therapist Reports compliance with Lexapro 20 mg daily Reviewed nonpharmacological interventions for anxiety and depression including mindfulness, meditation, a heart healthy  diet, and increasing physical activity Encouraged to take Xanax 0.25 mg as needed for anxiety Refill Lexapro 20 mg to take daily Referral placed to integrated behavioral health for talk therapy    Panic attack -     Amb ref to [facility] -     ALPRAZolam; Take 1 tablet (0.25 mg total) by mouth as needed for anxiety.  Dispense: 10 tablet; Refill: 0  Mixed hyperlipidemia Assessment & Plan: She takes Lipitor 20 mg daily and omega-3 fatty acid 1000 mg capsule daily She denies muscle aches and pain Encouraged to increase her intake of foods rich in fruits, vegetables, whole grains Lean proteins: chicken, fish, beans, legumes Low Fat dairy products Reduced intake of saturated fats, trans fatty acids, cholesterol Aim to be active at least 5 days a week for 30 minutes each day ( walking briskly)  Will assess lipid panel today Lab Results  Component Value Date   CHOL 172 12/01/2022   HDL 40 12/01/2022   LDLCALC 105 (H) 12/01/2022   TRIG 151 (H) 12/01/2022   CHOLHDL 4.3 12/01/2022      Essential hypertension, benign Assessment & Plan: Controlled She takes losartan 50 mg daily She denies headaches, dizziness, blurred vision, chest pain, palpitation, and shortness of breath Encouraged low-sodium diet with increased physical activity BP Readings from Last 3 Encounters:  03/29/23 124/62  02/20/23 (!) 94/56  02/14/23 116/63       Gastroesophageal reflux disease without esophagitis -     Omeprazole; Take 1 capsule (40 mg total) by mouth daily.  Dispense: 90 capsule; Refill: 0  IFG (impaired fasting glucose) -     Hemoglobin A1c  Vitamin D deficiency -     VITAMIN D 25 Hydroxy (Vit-D Deficiency, Fractures)  Other specified hypothyroidism -     TSH + free T4  Other hyperlipidemia -     Lipid panel -     CMP14+EGFR -     CBC with Differential/Platelet  Note: This chart has been completed using [employer] (consulting) software, and while attempts have been  made to ensure accuracy, certain words and phrases may not be transcribed as intended.    Follow-up: Return in about 4 months (around 07/29/2023).   Gilmore Laroche, FNP

## 2023-03-29 NOTE — Assessment & Plan Note (Addendum)
GAD-7 is 11 PHQ-9 is 11 Denies suicidal thoughts evaluation Reports increase anxiety and panic attacks as of recently He is interested in speaking with a therapist Reports compliance with Lexapro 20 mg daily Reviewed nonpharmacological interventions for anxiety and depression including mindfulness, meditation, a heart healthy diet, and increasing physical activity Encouraged to take Xanax 0.25 mg as needed for anxiety Refill Lexapro 20 mg to take daily Referral placed to integrated behavioral health for talk therapy

## 2023-03-30 ENCOUNTER — Other Ambulatory Visit: Payer: Self-pay | Admitting: Family Medicine

## 2023-03-30 DIAGNOSIS — E1165 Type 2 diabetes mellitus with hyperglycemia: Secondary | ICD-10-CM

## 2023-03-30 MED ORDER — OZEMPIC (0.25 OR 0.5 MG/DOSE) 2 MG/3ML ~~LOC~~ SOPN: 0.2500 mg | PEN_INJECTOR | SUBCUTANEOUS | 0 refills | Status: DC

## 2023-03-31 ENCOUNTER — Other Ambulatory Visit: Payer: Self-pay

## 2023-03-31 DIAGNOSIS — E1165 Type 2 diabetes mellitus with hyperglycemia: Secondary | ICD-10-CM

## 2023-03-31 MED ORDER — OZEMPIC (0.25 OR 0.5 MG/DOSE) 2 MG/3ML ~~LOC~~ SOPN
0.2500 mg | PEN_INJECTOR | SUBCUTANEOUS | 0 refills | Status: DC
Start: 2023-03-31 — End: 2023-04-18

## 2023-04-05 ENCOUNTER — Other Ambulatory Visit: Payer: Self-pay | Admitting: Family Medicine

## 2023-04-05 DIAGNOSIS — E1165 Type 2 diabetes mellitus with hyperglycemia: Secondary | ICD-10-CM

## 2023-04-06 ENCOUNTER — Other Ambulatory Visit: Payer: Self-pay | Admitting: Family Medicine

## 2023-04-06 DIAGNOSIS — E1165 Type 2 diabetes mellitus with hyperglycemia: Secondary | ICD-10-CM

## 2023-04-06 MED ORDER — TIRZEPATIDE 10 MG/0.5ML ~~LOC~~ SOAJ
10.0000 mg | SUBCUTANEOUS | 0 refills | Status: DC
Start: 2023-04-06 — End: 2023-04-27

## 2023-04-12 ENCOUNTER — Ambulatory Visit: Payer: No Typology Code available for payment source

## 2023-04-17 ENCOUNTER — Telehealth: Payer: Self-pay | Admitting: Family Medicine

## 2023-04-17 NOTE — Telephone Encounter (Signed)
Patient came by office dropped off Ozempic form.   Copied Noted Sleeved  Need to fax [phone] and patient to pick up a copy also

## 2023-04-18 ENCOUNTER — Other Ambulatory Visit: Payer: Self-pay

## 2023-04-18 DIAGNOSIS — E1165 Type 2 diabetes mellitus with hyperglycemia: Secondary | ICD-10-CM

## 2023-04-18 MED ORDER — OZEMPIC (0.25 OR 0.5 MG/DOSE) 2 MG/3ML ~~LOC~~ SOPN
0.2500 mg | PEN_INJECTOR | SUBCUTANEOUS | 0 refills | Status: AC
Start: 2023-04-18 — End: ?

## 2023-04-25 ENCOUNTER — Other Ambulatory Visit: Payer: Self-pay | Admitting: Family Medicine

## 2023-04-25 DIAGNOSIS — F410 Panic disorder [episodic paroxysmal anxiety] without agoraphobia: Secondary | ICD-10-CM

## 2023-04-25 DIAGNOSIS — M62838 Other muscle spasm: Secondary | ICD-10-CM

## 2023-04-25 MED ORDER — TIZANIDINE HCL 4 MG PO TABS
4.0000 mg | ORAL_TABLET | Freq: Four times a day (QID) | ORAL | 0 refills | Status: AC | PRN
Start: 2023-04-25 — End: ?

## 2023-04-25 MED ORDER — ALPRAZOLAM 0.25 MG PO TABS
0.2500 mg | ORAL_TABLET | ORAL | 0 refills | Status: AC | PRN
Start: 2023-04-25 — End: ?

## 2023-04-26 ENCOUNTER — Other Ambulatory Visit: Payer: Self-pay | Admitting: Family Medicine

## 2023-04-26 DIAGNOSIS — G479 Sleep disorder, unspecified: Secondary | ICD-10-CM

## 2023-04-26 MED ORDER — HYDROXYZINE PAMOATE 50 MG PO CAPS
50.0000 mg | ORAL_CAPSULE | Freq: Every evening | ORAL | 0 refills | Status: AC | PRN
Start: 2023-04-26 — End: ?

## 2023-04-27 ENCOUNTER — Other Ambulatory Visit: Payer: Self-pay

## 2023-04-27 DIAGNOSIS — E1165 Type 2 diabetes mellitus with hyperglycemia: Secondary | ICD-10-CM

## 2023-04-27 MED ORDER — TIRZEPATIDE 10 MG/0.5ML ~~LOC~~ SOAJ: 10.0000 mg | SUBCUTANEOUS | 0 refills | Status: DC

## 2023-05-01 ENCOUNTER — Other Ambulatory Visit: Payer: Self-pay | Admitting: Family Medicine

## 2023-05-01 ENCOUNTER — Other Ambulatory Visit: Payer: Self-pay

## 2023-05-01 DIAGNOSIS — E876 Hypokalemia: Secondary | ICD-10-CM

## 2023-05-01 DIAGNOSIS — E1165 Type 2 diabetes mellitus with hyperglycemia: Secondary | ICD-10-CM

## 2023-05-01 MED ORDER — TIRZEPATIDE 10 MG/0.5ML ~~LOC~~ SOAJ
10.0000 mg | SUBCUTANEOUS | 0 refills | Status: DC
Start: 2023-05-01 — End: 2023-05-23

## 2023-05-02 ENCOUNTER — Inpatient Hospital Stay: Payer: No Typology Code available for payment source | Attending: Hematology

## 2023-05-02 DIAGNOSIS — E876 Hypokalemia: Secondary | ICD-10-CM | POA: Insufficient documentation

## 2023-05-02 DIAGNOSIS — R768 Other specified abnormal immunological findings in serum: Secondary | ICD-10-CM | POA: Insufficient documentation

## 2023-05-02 LAB — CBC WITH DIFFERENTIAL/PLATELET
Abs Immature Granulocytes: 0.01 10*3/uL (ref 0.00–0.07)
Basophils Absolute: 0 10*3/uL (ref 0.0–0.1)
Basophils Relative: 1 %
Eosinophils Absolute: 0.2 10*3/uL (ref 0.0–0.5)
Eosinophils Relative: 3 %
HCT: 37.5 % (ref 36.0–46.0)
Hemoglobin: 12.6 g/dL (ref 12.0–15.0)
Immature Granulocytes: 0 %
Lymphocytes Relative: 30 %
Lymphs Abs: 1.8 10*3/uL (ref 0.7–4.0)
MCH: 31.1 pg (ref 26.0–34.0)
MCHC: 33.6 g/dL (ref 30.0–36.0)
MCV: 92.6 fL (ref 80.0–100.0)
Monocytes Absolute: 0.4 10*3/uL (ref 0.1–1.0)
Monocytes Relative: 7 %
Neutro Abs: 3.6 10*3/uL (ref 1.7–7.7)
Neutrophils Relative %: 59 %
Platelets: 333 10*3/uL (ref 150–400)
RBC: 4.05 MIL/uL (ref 3.87–5.11)
RDW: 12 % (ref 11.5–15.5)
WBC: 6.1 10*3/uL (ref 4.0–10.5)
nRBC: 0 % (ref 0.0–0.2)

## 2023-05-02 LAB — POTASSIUM: Potassium: 4 mmol/L (ref 3.5–5.1)

## 2023-05-11 ENCOUNTER — Telehealth: Payer: Self-pay | Admitting: Family Medicine

## 2023-05-11 NOTE — Telephone Encounter (Signed)
Pt returning call

## 2023-05-23 ENCOUNTER — Encounter: Payer: Self-pay | Admitting: "Endocrinology

## 2023-05-23 ENCOUNTER — Ambulatory Visit (INDEPENDENT_AMBULATORY_CARE_PROVIDER_SITE_OTHER): Payer: No Typology Code available for payment source | Admitting: "Endocrinology

## 2023-05-23 VITALS — BP 128/64 | HR 80 | Ht 65.5 in | Wt 156.2 lb

## 2023-05-23 DIAGNOSIS — Z7984 Long term (current) use of oral hypoglycemic drugs: Secondary | ICD-10-CM | POA: Diagnosis not present

## 2023-05-23 DIAGNOSIS — I10 Essential (primary) hypertension: Secondary | ICD-10-CM

## 2023-05-23 DIAGNOSIS — E782 Mixed hyperlipidemia: Secondary | ICD-10-CM

## 2023-05-23 DIAGNOSIS — E1165 Type 2 diabetes mellitus with hyperglycemia: Secondary | ICD-10-CM | POA: Diagnosis not present

## 2023-05-23 LAB — POCT GLYCOSYLATED HEMOGLOBIN (HGB A1C): HbA1c, POC (controlled diabetic range): 5.2 % (ref 0.0–7.0)

## 2023-05-23 MED ORDER — METFORMIN HCL 500 MG PO TABS: 500.0000 mg | ORAL_TABLET | Freq: Two times a day (BID) | ORAL | 1 refills | Status: DC

## 2023-05-23 NOTE — Progress Notes (Signed)
05/23/2023, 1:28 PM    Endocrinology follow-up note    Subjective:    Patient ID: Laura Scott, female    DOB: [birth year].  Laura Scott is being seen in follow-up after she was seen in consultation for  management of currently controlled symptomatic diabetes requested by  Gilmore Laroche, FNP.   Past Medical History:  Diagnosis Date   Allergic rhinitis    Anxiety    Chronic pain    Depression    Diabetes mellitus without complication (HCC)    GERD (gastroesophageal reflux disease)    Hyperlipidemia    Hypertension    IBS (irritable bowel syndrome)    PMS (premenstrual syndrome)    Reflux     Past Surgical History:  Procedure Laterality Date   BREAST BIOPSY Right 12/07/2022   MM RT BREAST BX W LOC DEV 1ST LESION IMAGE BX SPEC STEREO GUIDE 12/07/2022 [facility]   COLONOSCOPY  08/29/2011   [name]: normal. high risk screening tcs in 5 years.    COLONOSCOPY WITH PROPOFOL N/A 10/20/2016   Procedure: COLONOSCOPY WITH PROPOFOL;  Surgeon: Corbin Ade, MD;  [facility];  Service: Endoscopy;  Laterality: N/A;  10:45am   COLONOSCOPY WITH PROPOFOL N/A 02/09/2022   Procedure: COLONOSCOPY WITH PROPOFOL;  Surgeon: Corbin Ade, MD;  Location: [facility];  Service: Endoscopy;  Laterality: N/A;  2:30 / ASA 2   ESOPHAGOGASTRODUODENOSCOPY (EGD) WITH PROPOFOL N/A 10/20/2016   Procedure: ESOPHAGOGASTRODUODENOSCOPY (EGD) WITH PROPOFOL;  Surgeon: Corbin Ade, MD;  [facility];  Service: Endoscopy;  Laterality: N/A;   LUMBAR DISC SURGERY     MALONEY DILATION N/A 10/20/2016   Procedure: Elease Hashimoto DILATION;  Surgeon: Corbin Ade, MD;  [facility]: [facility];  Service: Endoscopy;  Laterality: N/A;   POLYPECTOMY  10/20/2016   Procedure: POLYPECTOMY;  Surgeon: Corbin Ade, MD;  [facility];  Service: Endoscopy;;  cecal and sigmoid polypectomies    Social  History   Socioeconomic History   Marital status: Divorced    Spouse name: Not on file   Number of children: Not on file   Years of education: Not on file   Highest education level: Not on file  Occupational History   Not on file  Tobacco Use   Smoking status: Never    Passive exposure: Current   Smokeless tobacco: Never  Vaping Use   Vaping status: Never Used  Substance and Sexual Activity   Alcohol use: Yes    Comment: occ   Drug use: No   Sexual activity: Yes    Birth control/protection: None, Post-menopausal  Other Topics Concern   Not on file  Social History Narrative   Lives with her brother. Pt is on disability from her lumbar fusion.    Social Determinants of Health   Financial Resource Strain: Not on file  Food Insecurity: Not on file  Transportation Needs: Not on file  Physical Activity: Not on file  Stress: Not on file  Social Connections: Not on file    Family History  Problem Relation Age of Onset   Hypertension Mother  Colon polyps Mother    Hypertension Father    Diabetes Father    Hypertension Sister    Hyperlipidemia Sister    Colon polyps Brother    Arthritis Brother    Diabetes Maternal Aunt    Hypertension Maternal Aunt    Rheum arthritis Maternal Grandmother    Prostate cancer Maternal Grandfather    Cancer Paternal Grandfather        not sure of type of cancer   Breast cancer Neg Hx    Cervical cancer Neg Hx     Outpatient Encounter Medications as of 05/23/2023  Medication Sig   albuterol (VENTOLIN HFA) 108 (90 Base) MCG/ACT inhaler Inhale 1-2 puffs into the lungs every 6 (six) hours as needed for wheezing or shortness of breath.   ALPRAZolam (XANAX) 0.25 MG tablet Take 1 tablet (0.25 mg total) by mouth as needed for anxiety.   atorvastatin (LIPITOR) 40 MG tablet Take 1 tablet (40 mg total) by mouth daily.   budesonide-formoterol (SYMBICORT) 160-4.5 MCG/ACT inhaler Inhale 2 puffs into the lungs 2 (two) times daily. (Patient taking  differently: Inhale 2 puffs into the lungs daily as needed (Shortness of breath).)   Cholecalciferol (VITAMIN D) 125 MCG (5000 UT) CAPS Take 5,000 Units by mouth daily with breakfast.   diclofenac Sodium (VOLTAREN) 1 % GEL Apply 2-4 grams to affected joint 4 times daily as needed.   escitalopram (LEXAPRO) 20 MG tablet Take 1 tablet (20 mg total) by mouth daily.   fluticasone (FLONASE) 50 MCG/ACT nasal spray Place 2 sprays into both nostrils daily. (Patient taking differently: Place 2 sprays into both nostrils daily as needed for allergies.)   glucose blood (ACCU-CHEK GUIDE) test strip CHECK BLOOD SUGAR ONCE  DAILY   hydrochlorothiazide (HYDRODIURIL) 25 MG tablet TAKE 1 TABLET BY MOUTH ONCE DAILY AS NEEDED   hydrOXYzine (VISTARIL) 50 MG capsule Take 1 capsule (50 mg total) by mouth at bedtime as needed.   losartan (COZAAR) 50 MG tablet Take 1 tablet (50 mg total) by mouth daily.   metFORMIN (GLUCOPHAGE) 500 MG tablet Take 1 tablet (500 mg total) by mouth 2 (two) times daily after a meal.   Omega-3 Fatty Acids (FISH OIL OMEGA-3) 1000 MG CAPS Take 1 g by mouth daily.   omeprazole (PRILOSEC) 40 MG capsule Take 1 capsule (40 mg total) by mouth daily.   potassium chloride SA (KLOR-CON M) 20 MEQ tablet Take 1 tablet (20 mEq total) by mouth daily.   Probiotic Product (PROBIOTIC DAILY PO) Take by mouth.   tiZANidine (ZANAFLEX) 4 MG tablet Take 1 tablet (4 mg total) by mouth every 6 (six) hours as needed for muscle spasms.   [DISCONTINUED] metFORMIN (GLUCOPHAGE) 500 MG tablet Take 1 tablet (500 mg total) by mouth 2 (two) times daily after a meal. (Patient taking differently: Take 500 mg by mouth daily with breakfast.)   [DISCONTINUED] Semaglutide,0.25 or 0.5MG /DOS, (OZEMPIC, 0.25 OR 0.5 MG/DOSE,) 2 MG/3ML SOPN Inject 0.25 mg into the skin once a week. (Patient not taking: Reported on 05/23/2023)   [DISCONTINUED] tirzepatide (MOUNJARO) 10 MG/0.5ML Pen Inject 10 mg into the skin once a week. (Patient not taking:  Reported on 05/23/2023)   No facility-administered encounter medications on file as of 05/23/2023.    ALLERGIES: Allergies  Allergen Reactions   Sulfa Antibiotics Hives, Diarrhea and Itching   Other     SURGICAL TAPE Reaction: rash    Penicillins Hives, Itching and Rash    Pt states that she is able to take  PCN  Has patient had a PCN reaction causing immediate rash, facial/tongue/throat swelling, SOB or lightheadedness with hypotension: Yes Has patient had a PCN reaction causing severe rash involving mucus membranes or skin necrosis: No Has patient had a PCN reaction that required hospitalization: No Has patient had a PCN reaction occurring within the last 10 years: Yes If all of the above answers are "NO", then may proceed with Cephalosporin use.    VACCINATION STATUS: Immunization History  Administered Date(s) Administered   Influenza,inj,Quad PF,6+ Mos 05/31/2018, 08/07/2021, 11/12/2021   Influenza-Unspecified 05/23/2011   Moderna Covid-19 Vaccine Bivalent Booster 68yrs & up 09/07/2021   Moderna Sars-Covid-2 Vaccination 11/02/2019, 12/04/2019, 08/06/2020   Pneumococcal Polysaccharide-23 08/20/2018    Diabetes She presents for her follow-up diabetic visit. She has type 2 diabetes mellitus. Onset time: She was diagnosed at approximate age of 35 years. Her disease course has been improving. There are no hypoglycemic associated symptoms. Pertinent negatives for hypoglycemia include no confusion, headaches, pallor or seizures. There are no diabetic associated symptoms. Pertinent negatives for diabetes include no chest pain, no polydipsia, no polyphagia and no polyuria. There are no hypoglycemic complications. Symptoms are improving. There are no diabetic complications. Risk factors for coronary artery disease include diabetes mellitus, dyslipidemia, family history, hypertension, post-menopausal and sedentary lifestyle. Current diabetic treatment includes oral agent (monotherapy). Her  weight is fluctuating minimally. She is following a generally unhealthy diet. When asked about meal planning, she reported none. She has not had a previous visit with a dietitian. She rarely participates in exercise. Her home blood glucose trend is decreasing steadily. Her breakfast blood glucose range is generally 110-130 mg/dl. Her overall blood glucose range is 110-130 mg/dl. (She presents with glycemic profile to target.  Her point-of-care A1c is 5.2%, progressively improving from 8.5%.  She did not document any hypoglycemia.    ) An ACE inhibitor/angiotensin II receptor blocker is being taken. She does not see a podiatrist.Eye exam is current.  Hyperlipidemia This is a chronic problem. The current episode started more than 1 year ago. The problem is uncontrolled. Recent lipid tests were reviewed and are variable. Exacerbating diseases include diabetes. Factors aggravating her hyperlipidemia include fatty foods. Pertinent negatives include no chest pain, myalgias or shortness of breath. Current antihyperlipidemic treatment includes statins. Risk factors for coronary artery disease include dyslipidemia, diabetes mellitus, hypertension, a sedentary lifestyle and post-menopausal.  Hypertension This is a chronic problem. The current episode started more than 1 year ago. The problem has been resolved since onset. The problem is controlled. Pertinent negatives include no chest pain, headaches, palpitations or shortness of breath. Risk factors for coronary artery disease include dyslipidemia, diabetes mellitus, family history, sedentary lifestyle and post-menopausal state. Past treatments include diuretics and angiotensin blockers. The current treatment provides moderate improvement. There are no compliance problems.    Review of systems  Constitutional: + Minimally fluctuating body weight,  current Body mass index is 25.6 kg/m. , no fatigue, no subjective hyperthermia, no subjective  hypothermia  Respiratory: no cough, no shortness of breath Gastrointestinal: no nausea/vomiting, + diarrhea   Objective:    BP 128/64   Pulse 80   Ht 5' 5.5" (1.664 m)   Wt 156 lb 3.2 oz (70.9 kg)   LMP 09/03/2013   BMI 25.60 kg/m   Wt Readings from Last 3 Encounters:  05/23/23 156 lb 3.2 oz (70.9 kg)  03/29/23 154 lb 1.3 oz (69.9 kg)  02/20/23 153 lb 3.2 oz (69.5 kg)     BP Readings from  Last 3 Encounters:  05/23/23 128/64  03/29/23 124/62  02/20/23 (!) 94/56     Physical Exam- Limited  Constitutional:  Body mass index is 25.6 kg/m. , not in acute distress, normal state of mind  CMP     Component Value Date/Time   NA 133 (L) 01/27/2023 1115   NA 139 12/01/2022 1129   K 4.0 05/02/2023 0940   CL 95 (L) 01/27/2023 1115   CO2 26 01/27/2023 1115   GLUCOSE 123 (H) 01/27/2023 1115   BUN 10 01/27/2023 1115   BUN 9 12/01/2022 1129   CREATININE 0.89 01/27/2023 1115   CREATININE 1.03 12/23/2019 1036   CALCIUM 9.3 01/27/2023 1115   PROT 7.0 03/27/2023 1428   ALBUMIN 4.4 03/27/2023 1428   AST 24 03/27/2023 1428   ALT 27 03/27/2023 1428   ALKPHOS 78 03/27/2023 1428   BILITOT 0.5 03/27/2023 1428   GFRNONAA >60 01/27/2023 1115   GFRNONAA 60 12/23/2019 1036   GFRAA 69 12/23/2019 1036     Diabetic Labs (most recent): Lab Results  Component Value Date   HGBA1C 5.2 05/23/2023   HGBA1C 8.5 (H) 12/01/2022   HGBA1C 6.2 08/19/2022   MICROALBUR 80 08/31/2020   MICROALBUR 2.8 12/23/2019     Lipid Panel ( most recent) Lipid Panel     Component Value Date/Time   CHOL 172 12/01/2022 1129   TRIG 151 (H) 12/01/2022 1129   HDL 40 12/01/2022 1129   CHOLHDL 4.3 12/01/2022 1129   CHOLHDL 3.9 09/10/2014 0805   VLDL 25 09/10/2014 0805   LDLCALC 105 (H) 12/01/2022 1129   LABVLDL 27 12/01/2022 1129     Assessment & Plan:   1) Type 2 diabetes mellitus with hyperglycemia, without long-term current use of insulin (HCC)  - Laura Scott has currently uncontrolled  symptomatic type 2 DM since  62 years of age.  She presents with glycemic profile to target.  Her point-of-care A1c is 5.2%, progressively improving from 8.5%.  She did not document any hypoglycemia.   She has had access for [facility] for at least 2 months.  She lost coverage and assistance for it, off of it for the last month.  She has started patient assistance program for Ozempic.  Recent labs reviewed.  - I had a long discussion with her about the progressive nature of diabetes and the pathology behind its complications. -She does not report any gross complications from her diabetes, however, she remains at a high risk for more acute and chronic complications which include CAD, CVA, CKD, retinopathy, and neuropathy. These are all discussed in detail with her.  - Nutritional counseling repeated at each appointment due to patients tendency to fall back in to old habits.  - she acknowledges that there is a room for improvement in her food and drink choices. - Suggestion is made for her to avoid simple carbohydrates  from her diet including Cakes, Sweet Desserts, Ice Cream, Soda (diet and regular), Sweet Tea, Candies, Chips, Cookies, Store Bought Juices, Alcohol , Artificial Sweeteners,  Coffee Creamer, and "Sugar-free" Products, Lemonade. This will help patient to have more stable blood glucose profile and potentially avoid unintended weight gain.  The following Lifestyle Medicine recommendations according to American College of Lifestyle Medicine  [facility]) were discussed and and offered to patient and she  agrees to start the journey:  A. Whole Foods, Plant-Based Nutrition comprising of fruits and vegetables, plant-based proteins, whole-grain carbohydrates was discussed in detail with the patient.   A list for  source of those nutrients were also provided to the patient.  Patient will use only water or unsweetened tea for hydration. B.  The need to stay away from risky substances including alcohol,  smoking; obtaining 7 to 9 hours of restorative sleep, at least 150 minutes of moderate intensity exercise weekly, the importance of healthy social connections,  and stress management techniques were discussed. C.  A full color page of  Calorie density of various food groups per pound showing examples of each food groups was provided to the patient.   - she has been scheduled with Norm Salt, RDN, CDE for diabetes education.  - I have approached her with the following individualized plan to manage  her diabetes and patient agrees:   -In light of her presentation controlled glycemic profile, she will not need additional intervention at this time.  She is advised to continue metformin 500 mg p.o. daily at breakfast.  She has started patient assistance program for [facility] or Ozempic.  If she is successful getting coverage, she would benefit from these medications.  If not she will be back on metformin 500 mg p.o. twice daily.   She  is advised to monitor blood glucose at least 2 times daily-daily before breakfast and at bedtime.    - Specific targets for  A1c;  LDL, HDL,  and Triglycerides were discussed with the patient.  2) Blood Pressure /Hypertension:  -Her blood pressure is controlled to target.  she is advised to continue her current meds including HCTZ 25 mg p.o. daily with breakfast .  She is also on Losartan 50 mg p.o. daily.  3) Lipids/Hyperlipidemia:    Review of her previsit lipid panel showed slight improvement in her LDL to 105 from 115.  She is advised to continue her atorvastatin 20 mg p.o. nightly.  She will be considered for fasting lipid panel before her next visit.    She will also benefit from whole food plant-based diet discussed above. She does have elevated liver enzymes which could be a sign of fatty liver.  This is discussed with her the potential risk for liver failure.  If her ferritin remains high, she will need a consult with hematology.   4)  Weight/Diet:   Her  Body mass index is 25.6 kg/m.  -   she is nontachycardic for major weight loss.   I discussed with her the fact that loss of 5 - 10% of her  current body weight will have the most impact on her diabetes management.  Exercise, and detailed carbohydrates information provided  -  detailed on discharge instructions.  5) Chronic Care/Health Maintenance: -she  is on ARB and statin medications and  is encouraged to initiate and continue to follow up with Ophthalmology, Dentist,  Podiatrist at least yearly or according to recommendations, and advised to stay away from smoking. I have recommended yearly flu vaccine and pneumonia vaccine at least every 5 years; moderate intensity exercise for up to 150 minutes weekly; and  sleep for at least 7 hours a day.   - she is  advised to maintain close follow up with Gilmore Laroche, FNP for primary care needs, as well as her other providers for optimal and coordinated care.   I spent  26  minutes in the care of the patient today including review of labs from CMP, Lipids, Thyroid Function, Hematology (current and previous including abstractions from other facilities); face-to-face time discussing  her blood glucose readings/logs, discussing hypoglycemia and hyperglycemia episodes and  symptoms, medications doses, her options of short and long term treatment based on the latest standards of care / guidelines;  discussion about incorporating lifestyle medicine;  and documenting the encounter. Risk reduction counseling performed per USPSTF guidelines to reduce cardiovascular risk factors.     Please refer to Patient Instructions for Blood Glucose Monitoring and Insulin/Medications Dosing Guide"  in media tab for additional information. Please  also refer to " Patient Self Inventory" in the Media  tab for reviewed elements of pertinent patient history.  Laura Scott participated in the discussions, expressed understanding, and voiced agreement with the above plans.  All  questions were answered to her satisfaction. she is encouraged to contact clinic should she have any questions or concerns prior to her return visit.   Follow up plan: - Return in about 6 months (around 11/21/2023) for F/U with Pre-visit Labs, Meter/CGM/Logs, A1c here.  Ronny Bacon, [facility] [address] [place], [place] [address] Phone: [phone] Fax: [phone]  05/23/2023, 1:28 PM

## 2023-05-23 NOTE — Patient Instructions (Signed)
                                     Advice for Weight Management  -For most of us the best way to lose weight is by diet management. Generally speaking, diet management means consuming less calories intentionally which over time brings about progressive weight loss.  This can be achieved more effectively by avoiding ultra processed carbohydrates, processed meats, unhealthy fats.    It is critically important to know your numbers: how much calorie you are consuming and how much calorie you need. More importantly, our carbohydrates sources should be unprocessed naturally occurring  complex starch food items.  It is always important to balance nutrition also by  appropriate intake of proteins (mainly plant-based), healthy fats/oils, plenty of fruits and vegetables.   -The American College of Lifestyle Medicine (ACL M) recommends nutrition derived mostly from Whole Food, Plant Predominant Sources example an apple instead of applesauce or apple pie. Eat Plenty of vegetables, Mushrooms, fruits, Legumes, Whole Grains, Nuts, seeds in lieu of processed meats, processed snacks/pastries red meat, poultry, eggs.  Use only water or unsweetened tea for hydration.  The College also recommends the need to stay away from risky substances including alcohol, smoking; obtaining 7-9 hours of restorative sleep, at least 150 minutes of moderate intensity exercise weekly, importance of healthy social connections, and being mindful of stress and seek help when it is overwhelming.    -Sticking to a routine mealtime to eat 3 meals a day and avoiding unnecessary snacks is shown to have a big role in weight control. Under normal circumstances, the only time we burn stored energy is when we are hungry, so allow  some hunger to take place- hunger means no food between appropriate meal times, only water.  It is not advisable to starve.   -It is better to avoid simple carbohydrates including:  Cakes, Sweet Desserts, Ice Cream, Soda (diet and regular), Sweet Tea, Candies, Chips, Cookies, Store Bought Juices, Alcohol in Excess of  1-2 drinks a day, Lemonade,  Artificial Sweeteners, Doughnuts, Coffee Creamers, "Sugar-free" Products, etc, etc.  This is not a complete list.....    -Consulting with certified diabetes educators is proven to provide you with the most accurate and current information on diet.  Also, you may be  interested in discussing diet options/exchanges , we can schedule a visit with Laura Scott, RDN, CDE for individualized nutrition education.  -Exercise: If you are able: 30 -60 minutes a day ,4 days a week, or 150 minutes of moderate intensity exercise weekly.    The longer the better if tolerated.  Combine stretch, strength, and aerobic activities.  If you were told in the past that you have high risk for cardiovascular diseases, or if you are currently symptomatic, you may seek evaluation by your heart doctor prior to initiating moderate to intense exercise programs.                                  Additional Care Considerations for Diabetes/Prediabetes   -Diabetes  is a chronic disease.  The most important care consideration is regular follow-up with your diabetes care provider with the goal being avoiding or delaying its complications and to take advantage of advances in medications and technology.  If appropriate actions are taken early enough, type 2 diabetes can even be   reversed.  Seek information from the right source.  - Whole Food, Plant Predominant Nutrition is highly recommended: Eat Plenty of vegetables, Mushrooms, fruits, Legumes, Whole Grains, Nuts, seeds in lieu of processed meats, processed snacks/pastries red meat, poultry, eggs as recommended by American College of  Lifestyle Medicine (ACLM).  -Type 2 diabetes is known to coexist with other important comorbidities such as high blood pressure and high cholesterol.  It is critical to control not only the  diabetes but also the high blood pressure and high cholesterol to minimize and delay the risk of complications including coronary artery disease, stroke, amputations, blindness, etc.  The good news is that this diet recommendation for type 2 diabetes is also very helpful for managing high cholesterol and high blood blood pressure.  - Studies showed that people with diabetes will benefit from a class of medications known as ACE inhibitors and statins.  Unless there are specific reasons not to be on these medications, the standard of care is to consider getting one from these groups of medications at an optimal doses.  These medications are generally considered safe and proven to help protect the heart and the kidneys.    - People with diabetes are encouraged to initiate and maintain regular follow-up with eye doctors, foot doctors, dentists , and if necessary heart and kidney doctors.     - It is highly recommended that people with diabetes quit smoking or stay away from smoking, and get yearly  flu vaccine and pneumonia vaccine at least every 5 years.  See above for additional recommendations on exercise, sleep, stress management , and healthy social connections.      

## 2023-05-25 ENCOUNTER — Other Ambulatory Visit: Payer: Self-pay | Admitting: Family Medicine

## 2023-05-25 DIAGNOSIS — G479 Sleep disorder, unspecified: Secondary | ICD-10-CM

## 2023-05-25 DIAGNOSIS — M62838 Other muscle spasm: Secondary | ICD-10-CM

## 2023-05-25 MED ORDER — TIZANIDINE HCL 4 MG PO TABS: 4.0000 mg | ORAL_TABLET | Freq: Four times a day (QID) | ORAL | 0 refills | Status: DC | PRN

## 2023-05-25 MED ORDER — HYDROXYZINE PAMOATE 50 MG PO CAPS: 50.0000 mg | ORAL_CAPSULE | Freq: Every evening | ORAL | 0 refills | Status: DC | PRN

## 2023-05-31 DIAGNOSIS — Z008 Encounter for other general examination: Secondary | ICD-10-CM | POA: Diagnosis not present

## 2023-05-31 DIAGNOSIS — I10 Essential (primary) hypertension: Secondary | ICD-10-CM | POA: Diagnosis not present

## 2023-06-12 DIAGNOSIS — I129 Hypertensive chronic kidney disease with stage 1 through stage 4 chronic kidney disease, or unspecified chronic kidney disease: Secondary | ICD-10-CM | POA: Diagnosis not present

## 2023-06-12 DIAGNOSIS — F324 Major depressive disorder, single episode, in partial remission: Secondary | ICD-10-CM | POA: Diagnosis not present

## 2023-06-12 DIAGNOSIS — F411 Generalized anxiety disorder: Secondary | ICD-10-CM | POA: Diagnosis not present

## 2023-06-12 DIAGNOSIS — E663 Overweight: Secondary | ICD-10-CM | POA: Diagnosis not present

## 2023-06-12 DIAGNOSIS — N182 Chronic kidney disease, stage 2 (mild): Secondary | ICD-10-CM | POA: Diagnosis not present

## 2023-06-12 DIAGNOSIS — Z008 Encounter for other general examination: Secondary | ICD-10-CM | POA: Diagnosis not present

## 2023-06-12 DIAGNOSIS — Z6825 Body mass index (BMI) 25.0-25.9, adult: Secondary | ICD-10-CM | POA: Diagnosis not present

## 2023-06-12 DIAGNOSIS — R2681 Unsteadiness on feet: Secondary | ICD-10-CM | POA: Diagnosis not present

## 2023-06-12 DIAGNOSIS — E785 Hyperlipidemia, unspecified: Secondary | ICD-10-CM | POA: Diagnosis not present

## 2023-07-10 ENCOUNTER — Other Ambulatory Visit: Payer: Self-pay | Admitting: Family Medicine

## 2023-07-10 DIAGNOSIS — G479 Sleep disorder, unspecified: Secondary | ICD-10-CM

## 2023-07-11 MED ORDER — HYDROXYZINE PAMOATE 50 MG PO CAPS: 50.0000 mg | ORAL_CAPSULE | Freq: Every evening | ORAL | 0 refills | Status: DC | PRN

## 2023-07-13 DIAGNOSIS — F411 Generalized anxiety disorder: Secondary | ICD-10-CM | POA: Diagnosis not present

## 2023-07-27 DIAGNOSIS — F411 Generalized anxiety disorder: Secondary | ICD-10-CM | POA: Diagnosis not present

## 2023-07-31 ENCOUNTER — Ambulatory Visit (INDEPENDENT_AMBULATORY_CARE_PROVIDER_SITE_OTHER): Payer: No Typology Code available for payment source | Admitting: Family Medicine

## 2023-07-31 ENCOUNTER — Encounter: Payer: Self-pay | Admitting: Family Medicine

## 2023-07-31 VITALS — BP 128/72 | HR 75 | Ht 65.5 in | Wt 168.1 lb

## 2023-07-31 DIAGNOSIS — E782 Mixed hyperlipidemia: Secondary | ICD-10-CM

## 2023-07-31 DIAGNOSIS — E1165 Type 2 diabetes mellitus with hyperglycemia: Secondary | ICD-10-CM

## 2023-07-31 DIAGNOSIS — Z7984 Long term (current) use of oral hypoglycemic drugs: Secondary | ICD-10-CM

## 2023-07-31 DIAGNOSIS — E038 Other specified hypothyroidism: Secondary | ICD-10-CM

## 2023-07-31 DIAGNOSIS — E559 Vitamin D deficiency, unspecified: Secondary | ICD-10-CM

## 2023-07-31 DIAGNOSIS — K219 Gastro-esophageal reflux disease without esophagitis: Secondary | ICD-10-CM

## 2023-07-31 DIAGNOSIS — Z23 Encounter for immunization: Secondary | ICD-10-CM | POA: Diagnosis not present

## 2023-07-31 DIAGNOSIS — I10 Essential (primary) hypertension: Secondary | ICD-10-CM | POA: Diagnosis not present

## 2023-07-31 MED ORDER — TIRZEPATIDE 2.5 MG/0.5ML ~~LOC~~ SOAJ: 2.5000 mg | SUBCUTANEOUS | 0 refills | Status: DC

## 2023-07-31 MED ORDER — ATORVASTATIN CALCIUM 40 MG PO TABS: 40.0000 mg | ORAL_TABLET | Freq: Every day | ORAL | 3 refills | Status: DC

## 2023-07-31 MED ORDER — OMEPRAZOLE 40 MG PO CPDR: 40.0000 mg | DELAYED_RELEASE_CAPSULE | Freq: Every day | ORAL | 0 refills | Status: DC

## 2023-07-31 MED ORDER — LOSARTAN POTASSIUM 50 MG PO TABS: 50.0000 mg | ORAL_TABLET | Freq: Every day | ORAL | 1 refills | Status: DC

## 2023-07-31 MED ORDER — HYDROCHLOROTHIAZIDE 25 MG PO TABS: ORAL_TABLET | ORAL | 1 refills | Status: DC

## 2023-07-31 NOTE — Assessment & Plan Note (Signed)
Stable on omeprazole 40 mg daily GERD diet encouraged

## 2023-07-31 NOTE — Patient Instructions (Addendum)
  I appreciate the opportunity to provide care to you today!    Follow up:  4 months  Labs: please stop by the lab during the week to get your blood drawn (CBC, CMP, TSH, Lipid profile, HgA1c, Vit D)   Please schedule diabetic eye exam   Nonpharmacologic management of anxiety and depression  Mindfulness and Meditation Practices like mindfulness meditation can help reduce symptoms by promoting relaxation and present-moment awareness.  Exercise  Regular physical activity has been shown to improve mood and reduce anxiety through the release of endorphins and other neurochemicals.  Healthy Diet Eating a balanced diet rich in fruits, vegetables, whole grains, and lean proteins can support overall mental health.  Sleep Hygiene  Establishing a regular sleep routine and ensuring good sleep quality can significantly impact mood and anxiety levels.  Stress Management Techniques Activities such as yoga, tai chi, and deep breathing exercises can help manage stress.  Social Support Maintaining strong relationships and seeking support from friends, family, or support groups can provide emotional comfort and reduce feelings of isolation.  Lifestyle Modifications Reducing alcohol and caffeine intake, quitting smoking, and avoiding recreational drugs can improve symptoms.  Art and Music Therapy Engaging in creative activities like painting, drawing, or playing music can be therapeutic and help express emotions.  Light Therapy Particularly useful for seasonal affective disorder (SAD), exposure to bright light can help regulate mood. .     Attached with your AVS, you will find valuable resources for self-education. I highly recommend dedicating some time to thoroughly examine them.   Please continue to a heart-healthy diet and increase your physical activities. Try to exercise for at least five days a week.    It was a pleasure to see you and I look forward to continuing to work together  on your health and well-being. Please do not hesitate to call the office if you need care or have questions about your care.  In case of emergency, please visit the Emergency Department for urgent care, or contact our clinic at [phone] to schedule an appointment. We're here to help you!   Have a wonderful day and week. With Gratitude, Gilmore Laroche MSN, FNP-BC

## 2023-07-31 NOTE — Assessment & Plan Note (Signed)
Controlled She takes losartan 50 mg daily She denies headaches, dizziness, blurred vision, chest pain, palpitation, and shortness of breath Encouraged low-sodium diet with increased physical activity BP Readings from Last 3 Encounters:  07/31/23 128/72  05/23/23 128/64  03/29/23 124/62

## 2023-07-31 NOTE — Assessment & Plan Note (Signed)
The patient reports not taking Mounjaro 7.5 mg weekly due to a high co-pay. She has been taking metformin 500 mg twice daily. The patient mentions that she is changing her insurance in January 2025, and her new insurance will cover [employer]. A prescription for Greggory Keen will be sent today for her to pick up in January when her insurance changes. She denies experiencing polyuria, polyphagia, or polydipsia. The patient was encouraged to decrease her intake of high-sugar foods and beverages and to increase physical activity.  Lab Results  Component Value Date   HGBA1C 5.2 05/23/2023

## 2023-07-31 NOTE — Assessment & Plan Note (Signed)
The patient takes Lipitor 40 mg daily and reports full compliance with the treatment regimen, with no adverse effects noted. She was encouraged to decrease her intake of greasy, fatty, and starchy foods, while increasing physical activity. The patient verbalized understanding and is aware of the plan of care. Lab Results  Component Value Date   CHOL 172 12/01/2022   HDL 40 12/01/2022   LDLCALC 105 (H) 12/01/2022   TRIG 151 (H) 12/01/2022   CHOLHDL 4.3 12/01/2022

## 2023-07-31 NOTE — Progress Notes (Signed)
Established Patient Office Visit  Subjective:  Patient ID: Laura Scott, female    DOB: 1961/03/27  Age: 62 y.o. MRN: [id number]  CC:  Chief Complaint  Patient presents with   Care Management    4 month f/u, needs refills on her medications, also states at the beginning of the year ins will change to go back on ozempic.     HPI Laura Scott is a 62 y.o. female with past medical history of hypertension, type 2 diabetes, hyperlipidemia presents for f/u of  chronic medical conditions. For the details of today's visit, please refer to the assessment and plan.     Past Medical History:  Diagnosis Date   Allergic rhinitis    Anxiety    Chronic pain    Depression    Diabetes mellitus without complication (HCC)    GERD (gastroesophageal reflux disease)    Hyperlipidemia    Hypertension    IBS (irritable bowel syndrome)    PMS (premenstrual syndrome)    Reflux     Past Surgical History:  Procedure Laterality Date   BREAST BIOPSY Right 12/07/2022   MM RT BREAST BX W LOC DEV 1ST LESION IMAGE BX SPEC STEREO GUIDE 12/07/2022 [facility]   COLONOSCOPY  08/29/2011   [name]: normal. high risk screening tcs in 5 years.    COLONOSCOPY WITH PROPOFOL N/A 10/20/2016   Procedure: COLONOSCOPY WITH PROPOFOL;  Surgeon: Corbin Ade, MD;  Location: [facility];  Service: Endoscopy;  Laterality: N/A;  10:45am   COLONOSCOPY WITH PROPOFOL N/A 02/09/2022   Procedure: COLONOSCOPY WITH PROPOFOL;  Surgeon: Corbin Ade, MD;  Location: [facility];  Service: Endoscopy;  Laterality: N/A;  2:30 / ASA 2   ESOPHAGOGASTRODUODENOSCOPY (EGD) WITH PROPOFOL N/A 10/20/2016   Procedure: ESOPHAGOGASTRODUODENOSCOPY (EGD) WITH PROPOFOL;  Surgeon: Corbin Ade, MD;  Location: [facility];  Service: Endoscopy;  Laterality: N/A;   LUMBAR DISC SURGERY     MALONEY DILATION N/A 10/20/2016   Procedure: Elease Hashimoto DILATION;  Surgeon: Corbin Ade, MD;  Location: [facility];  Service: Endoscopy;   Laterality: N/A;   POLYPECTOMY  10/20/2016   Procedure: POLYPECTOMY;  Surgeon: Corbin Ade, MD;  Location: [facility];  Service: Endoscopy;;  cecal and sigmoid polypectomies    Family History  Problem Relation Age of Onset   Hypertension Mother    Colon polyps Mother    Hypertension Father    Diabetes Father    Hypertension Sister    Hyperlipidemia Sister    Colon polyps Brother    Arthritis Brother    Diabetes Maternal Aunt    Hypertension Maternal Aunt    Rheum arthritis Maternal Grandmother    Prostate cancer Maternal Grandfather    Cancer Paternal Grandfather        not sure of type of cancer   Breast cancer Neg Hx    Cervical cancer Neg Hx     Social History   Socioeconomic History   Marital status: Divorced    Spouse name: Not on file   Number of children: Not on file   Years of education: Not on file   Highest education level: Not on file  Occupational History   Not on file  Tobacco Use   Smoking status: Never    Passive exposure: Current   Smokeless tobacco: Never  Vaping Use   Vaping status: Never Used  Substance and Sexual Activity   Alcohol use: Yes    Comment: occ  Drug use: No   Sexual activity: Yes    Birth control/protection: None, Post-menopausal  Other Topics Concern   Not on file  Social History Narrative   Lives with her brother. Pt is on disability from her lumbar fusion.    Social Determinants of Health   Financial Resource Strain: Not on file  Food Insecurity: Not on file  Transportation Needs: Not on file  Physical Activity: Not on file  Stress: Not on file  Social Connections: Not on file  Intimate Partner Violence: Not on file    Outpatient Medications Prior to Visit  Medication Sig Dispense Refill   albuterol (VENTOLIN HFA) 108 (90 Base) MCG/ACT inhaler Inhale 1-2 puffs into the lungs every 6 (six) hours as needed for wheezing or shortness of breath. 18 g 0   ALPRAZolam (XANAX) 0.25 MG tablet Take 1 tablet (0.25 mg  total) by mouth as needed for anxiety. 10 tablet 0   budesonide-formoterol (SYMBICORT) 160-4.5 MCG/ACT inhaler Inhale 2 puffs into the lungs 2 (two) times daily. (Patient taking differently: Inhale 2 puffs into the lungs daily as needed (Shortness of breath).) 1 Inhaler 5   Cholecalciferol (VITAMIN D) 125 MCG (5000 UT) CAPS Take 5,000 Units by mouth daily with breakfast. 30 capsule 2   diclofenac Sodium (VOLTAREN) 1 % GEL Apply 2-4 grams to affected joint 4 times daily as needed. 400 g 2   escitalopram (LEXAPRO) 20 MG tablet Take 1 tablet (20 mg total) by mouth daily. 90 tablet 1   fluticasone (FLONASE) 50 MCG/ACT nasal spray Place 2 sprays into both nostrils daily. (Patient taking differently: Place 2 sprays into both nostrils daily as needed for allergies.) 48 g 5   glucose blood (ACCU-CHEK GUIDE) test strip CHECK BLOOD SUGAR ONCE  DAILY 100 strip 3   hydrOXYzine (VISTARIL) 50 MG capsule Take 1 capsule (50 mg total) by mouth at bedtime as needed. 30 capsule 0   metFORMIN (GLUCOPHAGE) 500 MG tablet Take 1 tablet (500 mg total) by mouth 2 (two) times daily after a meal. 180 tablet 1   Omega-3 Fatty Acids (FISH OIL OMEGA-3) 1000 MG CAPS Take 1 g by mouth daily. 90 capsule 1   potassium chloride SA (KLOR-CON M) 20 MEQ tablet Take 1 tablet (20 mEq total) by mouth daily. 60 tablet 0   Probiotic Product (PROBIOTIC DAILY PO) Take by mouth.     tiZANidine (ZANAFLEX) 4 MG tablet Take 1 tablet (4 mg total) by mouth every 6 (six) hours as needed for muscle spasms. 30 tablet 0   atorvastatin (LIPITOR) 40 MG tablet Take 1 tablet (40 mg total) by mouth daily. 90 tablet 3   hydrochlorothiazide (HYDRODIURIL) 25 MG tablet TAKE 1 TABLET BY MOUTH ONCE DAILY AS NEEDED 90 tablet 1   losartan (COZAAR) 50 MG tablet Take 1 tablet (50 mg total) by mouth daily. 90 tablet 1   omeprazole (PRILOSEC) 40 MG capsule Take 1 capsule (40 mg total) by mouth daily. 90 capsule 0   No facility-administered medications prior to visit.     Allergies  Allergen Reactions   Sulfa Antibiotics Hives, Diarrhea and Itching   Other     SURGICAL TAPE Reaction: rash    Penicillins Hives, Itching and Rash    Pt states that she is able to take PCN  Has patient had a PCN reaction causing immediate rash, facial/tongue/throat swelling, SOB or lightheadedness with hypotension: Yes Has patient had a PCN reaction causing severe rash involving mucus membranes or skin necrosis: No Has  patient had a PCN reaction that required hospitalization: No Has patient had a PCN reaction occurring within the last 10 years: Yes If all of the above answers are "NO", then may proceed with Cephalosporin use.    ROS Review of Systems  Constitutional:  Negative for chills and fever.  Eyes:  Negative for visual disturbance.  Respiratory:  Negative for chest tightness and shortness of breath.   Neurological:  Negative for dizziness and headaches.      Objective:    Physical Exam HENT:     Head: Normocephalic.     Mouth/Throat:     Mouth: Mucous membranes are moist.  Cardiovascular:     Rate and Rhythm: Normal rate.     Heart sounds: Normal heart sounds.  Pulmonary:     Effort: Pulmonary effort is normal.     Breath sounds: Normal breath sounds.  Neurological:     Mental Status: She is alert.     BP 128/72 (BP Location: Left Arm)   Pulse 75   Ht 5' 5.5" (1.664 m)   Wt 168 lb 1.9 oz (76.3 kg)   LMP 09/03/2013   SpO2 96%   BMI 27.55 kg/m  Wt Readings from Last 3 Encounters:  07/31/23 168 lb 1.9 oz (76.3 kg)  05/23/23 156 lb 3.2 oz (70.9 kg)  03/29/23 154 lb 1.3 oz (69.9 kg)    Lab Results  Component Value Date   TSH 1.950 12/01/2022   Lab Results  Component Value Date   WBC 6.1 05/02/2023   HGB 12.6 05/02/2023   HCT 37.5 05/02/2023   MCV 92.6 05/02/2023   PLT 333 05/02/2023   Lab Results  Component Value Date   NA 133 (L) 01/27/2023   K 4.0 05/02/2023   CO2 26 01/27/2023   GLUCOSE 123 (H) 01/27/2023   BUN 10  01/27/2023   CREATININE 0.89 01/27/2023   BILITOT 0.5 03/27/2023   ALKPHOS 78 03/27/2023   AST 24 03/27/2023   ALT 27 03/27/2023   PROT 7.0 03/27/2023   ALBUMIN 4.4 03/27/2023   CALCIUM 9.3 01/27/2023   ANIONGAP 12 01/27/2023   EGFR 60 12/01/2022   Lab Results  Component Value Date   CHOL 172 12/01/2022   Lab Results  Component Value Date   HDL 40 12/01/2022   Lab Results  Component Value Date   LDLCALC 105 (H) 12/01/2022   Lab Results  Component Value Date   TRIG 151 (H) 12/01/2022   Lab Results  Component Value Date   CHOLHDL 4.3 12/01/2022   Lab Results  Component Value Date   HGBA1C 5.2 05/23/2023      Assessment & Plan:  Type 2 diabetes mellitus with hyperglycemia, without long-term current use of insulin (HCC) Assessment & Plan: The patient reports not taking Mounjaro 7.5 mg weekly due to a high co-pay. She has been taking metformin 500 mg twice daily. The patient mentions that she is changing her insurance in January 2025, and her new insurance will cover [employer]. A prescription for Greggory Keen will be sent today for her to pick up in January when her insurance changes. She denies experiencing polyuria, polyphagia, or polydipsia. The patient was encouraged to decrease her intake of high-sugar foods and beverages and to increase physical activity.  Lab Results  Component Value Date   HGBA1C 5.2 05/23/2023     Orders: -     Microalbumin / creatinine urine ratio -     Hemoglobin A1c -     Tirzepatide; Inject  2.5 mg into the skin once a week.  Dispense: 2 mL; Refill: 0  Essential hypertension, benign Assessment & Plan: Controlled She takes losartan 50 mg daily She denies headaches, dizziness, blurred vision, chest pain, palpitation, and shortness of breath Encouraged low-sodium diet with increased physical activity BP Readings from Last 3 Encounters:  07/31/23 128/72  05/23/23 128/64  03/29/23 124/62      Orders: -     Losartan Potassium; Take 1  tablet (50 mg total) by mouth daily.  Dispense: 90 tablet; Refill: 1 -     hydroCHLOROthiazide; TAKE 1 TABLET BY MOUTH ONCE DAILY AS NEEDED  Dispense: 90 tablet; Refill: 1  Mixed hyperlipidemia Assessment & Plan: The patient takes Lipitor 40 mg daily and reports full compliance with the treatment regimen, with no adverse effects noted. She was encouraged to decrease her intake of greasy, fatty, and starchy foods, while increasing physical activity. The patient verbalized understanding and is aware of the plan of care. Lab Results  Component Value Date   CHOL 172 12/01/2022   HDL 40 12/01/2022   LDLCALC 105 (H) 12/01/2022   TRIG 151 (H) 12/01/2022   CHOLHDL 4.3 12/01/2022     Orders: -     Lipid panel -     CMP14+EGFR -     CBC with Differential/Platelet -     Atorvastatin Calcium; Take 1 tablet (40 mg total) by mouth daily.  Dispense: 90 tablet; Refill: 3  Encounter for immunization Assessment & Plan: Patient educated on CDC recommendation for the vaccine. Verbal consent was obtained from the patient, vaccine administered by nurse, no sign of adverse reactions noted at this time. Patient education on arm soreness and use of tylenol or ibuprofen for this patient  was discussed. Patient educated on the signs and symptoms of adverse effect and advise to contact the office if they occur.   Orders: -     Flu vaccine trivalent PF, 6mos and older(Flulaval,Afluria,Fluarix,Fluzone)  Gastroesophageal reflux disease without esophagitis Assessment & Plan: Stable on omeprazole 40 mg daily GERD diet encouraged  Orders: -     Omeprazole; Take 1 capsule (40 mg total) by mouth daily.  Dispense: 90 capsule; Refill: 0  Vitamin D deficiency -     VITAMIN D 25 Hydroxy (Vit-D Deficiency, Fractures)  TSH (thyroid-stimulating hormone deficiency) -     TSH + free T4  Note: This chart has been completed using Engineer[employer] civil (consulting) software, and while attempts have been made to ensure accuracy,  certain words and phrases may not be transcribed as intended.    Follow-up: Return in about 4 years (around 07/31/2027).   Gilmore Laroche, FNP

## 2023-07-31 NOTE — Assessment & Plan Note (Signed)
Patient educated on CDC recommendation for the vaccine. Verbal consent was obtained from the patient, vaccine administered by nurse, no sign of adverse reactions noted at this time. Patient education on arm soreness and use of tylenol or ibuprofen for this patient  was discussed. Patient educated on the signs and symptoms of adverse effect and advise to contact the office if they occur.  

## 2023-08-02 DIAGNOSIS — E038 Other specified hypothyroidism: Secondary | ICD-10-CM | POA: Diagnosis not present

## 2023-08-02 DIAGNOSIS — E7849 Other hyperlipidemia: Secondary | ICD-10-CM | POA: Diagnosis not present

## 2023-08-02 DIAGNOSIS — E1165 Type 2 diabetes mellitus with hyperglycemia: Secondary | ICD-10-CM | POA: Diagnosis not present

## 2023-08-02 DIAGNOSIS — E559 Vitamin D deficiency, unspecified: Secondary | ICD-10-CM | POA: Diagnosis not present

## 2023-08-04 LAB — HEMOGLOBIN A1C
Est. average glucose Bld gHb Est-mCnc: 120 mg/dL
Hgb A1c MFr Bld: 5.8 % — ABNORMAL HIGH (ref 4.8–5.6)

## 2023-08-04 LAB — CBC WITH DIFFERENTIAL/PLATELET
Basophils Absolute: 0 10*3/uL (ref 0.0–0.2)
Basos: 1 %
EOS (ABSOLUTE): 0.2 10*3/uL (ref 0.0–0.4)
Eos: 3 %
Hematocrit: 36.6 % (ref 34.0–46.6)
Hemoglobin: 12.3 g/dL (ref 11.1–15.9)
Immature Grans (Abs): 0 10*3/uL (ref 0.0–0.1)
Immature Granulocytes: 0 %
Lymphocytes Absolute: 1.4 10*3/uL (ref 0.7–3.1)
Lymphs: 30 %
MCH: 31.2 pg (ref 26.6–33.0)
MCHC: 33.6 g/dL (ref 31.5–35.7)
MCV: 93 fL (ref 79–97)
Monocytes Absolute: 0.6 10*3/uL (ref 0.1–0.9)
Monocytes: 13 %
Neutrophils Absolute: 2.6 10*3/uL (ref 1.4–7.0)
Neutrophils: 53 %
Platelets: 244 10*3/uL (ref 150–450)
RBC: 3.94 x10E6/uL (ref 3.77–5.28)
RDW: 11.9 % (ref 11.7–15.4)
WBC: 4.9 10*3/uL (ref 3.4–10.8)

## 2023-08-04 LAB — MICROALBUMIN / CREATININE URINE RATIO
Creatinine, Urine: 203.2 mg/dL
Microalb/Creat Ratio: 4 mg/g{creat} (ref 0–29)
Microalbumin, Urine: 7.7 ug/mL

## 2023-08-04 LAB — CMP14+EGFR
ALT: 27 [IU]/L (ref 0–32)
AST: 30 [IU]/L (ref 0–40)
Albumin: 4.4 g/dL (ref 3.9–4.9)
Alkaline Phosphatase: 79 [IU]/L (ref 44–121)
BUN/Creatinine Ratio: 16 (ref 12–28)
BUN: 14 mg/dL (ref 8–27)
Bilirubin Total: 0.7 mg/dL (ref 0.0–1.2)
CO2: 21 mmol/L (ref 20–29)
Calcium: 9.6 mg/dL (ref 8.7–10.3)
Chloride: 100 mmol/L (ref 96–106)
Creatinine, Ser: 0.88 mg/dL (ref 0.57–1.00)
Globulin, Total: 2.9 g/dL (ref 1.5–4.5)
Glucose: 112 mg/dL — ABNORMAL HIGH (ref 70–99)
Potassium: 4.1 mmol/L (ref 3.5–5.2)
Sodium: 140 mmol/L (ref 134–144)
Total Protein: 7.3 g/dL (ref 6.0–8.5)
eGFR: 74 mL/min/{1.73_m2} (ref >59)

## 2023-08-04 LAB — LIPID PANEL
Chol/HDL Ratio: 2.7 {ratio} (ref 0.0–4.4)
Cholesterol, Total: 185 mg/dL (ref 100–199)
HDL: 68 mg/dL (ref >39)
LDL Chol Calc (NIH): 91 mg/dL (ref 0–99)
Triglycerides: 155 mg/dL — ABNORMAL HIGH (ref 0–149)
VLDL Cholesterol Cal: 26 mg/dL (ref 5–40)

## 2023-08-04 LAB — TSH+FREE T4
Free T4: 1.05 ng/dL (ref 0.82–1.77)
TSH: 2.38 u[IU]/mL (ref 0.450–4.500)

## 2023-08-04 LAB — VITAMIN D 25 HYDROXY (VIT D DEFICIENCY, FRACTURES): Vit D, 25-Hydroxy: 35 ng/mL (ref 30.0–100.0)

## 2023-08-10 DIAGNOSIS — F411 Generalized anxiety disorder: Secondary | ICD-10-CM | POA: Diagnosis not present

## 2023-08-18 ENCOUNTER — Other Ambulatory Visit: Payer: Self-pay

## 2023-08-18 DIAGNOSIS — E876 Hypokalemia: Secondary | ICD-10-CM

## 2023-08-18 DIAGNOSIS — R7989 Other specified abnormal findings of blood chemistry: Secondary | ICD-10-CM

## 2023-08-18 NOTE — Progress Notes (Signed)
Orders placed per Durenda Hurt, NP orders -Immunoglobulins, ferritin, iron and TIBC, CMP, cbc/d.

## 2023-08-21 ENCOUNTER — Inpatient Hospital Stay: Payer: No Typology Code available for payment source | Attending: Hematology

## 2023-08-21 DIAGNOSIS — R768 Other specified abnormal immunological findings in serum: Secondary | ICD-10-CM | POA: Diagnosis not present

## 2023-08-21 DIAGNOSIS — E876 Hypokalemia: Secondary | ICD-10-CM | POA: Diagnosis not present

## 2023-08-21 DIAGNOSIS — R7989 Other specified abnormal findings of blood chemistry: Secondary | ICD-10-CM | POA: Insufficient documentation

## 2023-08-21 DIAGNOSIS — Z79899 Other long term (current) drug therapy: Secondary | ICD-10-CM | POA: Insufficient documentation

## 2023-08-21 LAB — CBC WITH DIFFERENTIAL/PLATELET
Abs Immature Granulocytes: 0.01 10*3/uL (ref 0.00–0.07)
Basophils Absolute: 0 10*3/uL (ref 0.0–0.1)
Basophils Relative: 1 %
Eosinophils Absolute: 0.1 10*3/uL (ref 0.0–0.5)
Eosinophils Relative: 3 %
HCT: 37.4 % (ref 36.0–46.0)
Hemoglobin: 13 g/dL (ref 12.0–15.0)
Immature Granulocytes: 0 %
Lymphocytes Relative: 33 %
Lymphs Abs: 1.9 10*3/uL (ref 0.7–4.0)
MCH: 31.9 pg (ref 26.0–34.0)
MCHC: 34.8 g/dL (ref 30.0–36.0)
MCV: 91.9 fL (ref 80.0–100.0)
Monocytes Absolute: 0.4 10*3/uL (ref 0.1–1.0)
Monocytes Relative: 7 %
Neutro Abs: 3.2 10*3/uL (ref 1.7–7.7)
Neutrophils Relative %: 56 %
Platelets: 312 10*3/uL (ref 150–400)
RBC: 4.07 MIL/uL (ref 3.87–5.11)
RDW: 11.5 % (ref 11.5–15.5)
WBC: 5.7 10*3/uL (ref 4.0–10.5)
nRBC: 0 % (ref 0.0–0.2)

## 2023-08-21 LAB — COMPREHENSIVE METABOLIC PANEL
ALT: 39 U/L (ref 0–44)
AST: 41 U/L (ref 15–41)
Albumin: 4.2 g/dL (ref 3.5–5.0)
Alkaline Phosphatase: 59 U/L (ref 38–126)
Anion gap: 10 (ref 5–15)
BUN: 16 mg/dL (ref 8–23)
CO2: 28 mmol/L (ref 22–32)
Calcium: 9.5 mg/dL (ref 8.9–10.3)
Chloride: 101 mmol/L (ref 98–111)
Creatinine, Ser: 0.87 mg/dL (ref 0.44–1.00)
GFR, Estimated: >60 mL/min (ref >60)
Glucose, Bld: 136 mg/dL — ABNORMAL HIGH (ref 70–99)
Potassium: 3.8 mmol/L (ref 3.5–5.1)
Sodium: 139 mmol/L (ref 135–145)
Total Bilirubin: 0.6 mg/dL (ref 0.0–1.2)
Total Protein: 7.9 g/dL (ref 6.5–8.1)

## 2023-08-21 LAB — FERRITIN: Ferritin: 156 ng/mL (ref 11–307)

## 2023-08-21 LAB — IRON AND TIBC
Iron: 132 ug/dL (ref 28–170)
Saturation Ratios: 39 % — ABNORMAL HIGH (ref 10.4–31.8)
TIBC: 343 ug/dL (ref 250–450)
UIBC: 211 ug/dL

## 2023-08-23 LAB — IMMUNOGLOBULINS A/E/G/M, SERUM
IgA: 465 mg/dL — ABNORMAL HIGH (ref 87–352)
IgE (Immunoglobulin E), Serum: 54 [IU]/mL (ref 6–495)
IgG (Immunoglobin G), Serum: 1166 mg/dL (ref 586–1602)
IgM (Immunoglobulin M), Srm: 97 mg/dL (ref 26–217)

## 2023-08-29 ENCOUNTER — Other Ambulatory Visit: Payer: Self-pay | Admitting: Family Medicine

## 2023-08-29 DIAGNOSIS — G479 Sleep disorder, unspecified: Secondary | ICD-10-CM

## 2023-08-29 MED ORDER — HYDROXYZINE PAMOATE 50 MG PO CAPS: 50.0000 mg | ORAL_CAPSULE | Freq: Every evening | ORAL | 0 refills | Status: DC | PRN

## 2023-08-30 DIAGNOSIS — F411 Generalized anxiety disorder: Secondary | ICD-10-CM | POA: Diagnosis not present

## 2023-08-31 ENCOUNTER — Inpatient Hospital Stay: Payer: PPO | Admitting: Oncology

## 2023-09-07 ENCOUNTER — Inpatient Hospital Stay: Payer: PPO | Attending: Hematology | Admitting: Oncology

## 2023-09-07 VITALS — BP 152/75 | HR 81 | Temp 98.1°F | Resp 16 | Wt 167.3 lb

## 2023-09-07 DIAGNOSIS — Z79899 Other long term (current) drug therapy: Secondary | ICD-10-CM | POA: Diagnosis not present

## 2023-09-07 DIAGNOSIS — G8929 Other chronic pain: Secondary | ICD-10-CM | POA: Diagnosis not present

## 2023-09-07 DIAGNOSIS — Z83719 Family history of colon polyps, unspecified: Secondary | ICD-10-CM | POA: Insufficient documentation

## 2023-09-07 DIAGNOSIS — Z8261 Family history of arthritis: Secondary | ICD-10-CM | POA: Diagnosis not present

## 2023-09-07 DIAGNOSIS — Z880 Allergy status to penicillin: Secondary | ICD-10-CM | POA: Insufficient documentation

## 2023-09-07 DIAGNOSIS — I10 Essential (primary) hypertension: Secondary | ICD-10-CM | POA: Insufficient documentation

## 2023-09-07 DIAGNOSIS — E119 Type 2 diabetes mellitus without complications: Secondary | ICD-10-CM | POA: Diagnosis not present

## 2023-09-07 DIAGNOSIS — Z83438 Family history of other disorder of lipoprotein metabolism and other lipidemia: Secondary | ICD-10-CM | POA: Diagnosis not present

## 2023-09-07 DIAGNOSIS — Z8249 Family history of ischemic heart disease and other diseases of the circulatory system: Secondary | ICD-10-CM | POA: Diagnosis not present

## 2023-09-07 DIAGNOSIS — E876 Hypokalemia: Secondary | ICD-10-CM | POA: Diagnosis not present

## 2023-09-07 DIAGNOSIS — Z833 Family history of diabetes mellitus: Secondary | ICD-10-CM | POA: Insufficient documentation

## 2023-09-07 DIAGNOSIS — Z882 Allergy status to sulfonamides status: Secondary | ICD-10-CM | POA: Diagnosis not present

## 2023-09-07 DIAGNOSIS — K219 Gastro-esophageal reflux disease without esophagitis: Secondary | ICD-10-CM | POA: Insufficient documentation

## 2023-09-07 DIAGNOSIS — Z8349 Family history of other endocrine, nutritional and metabolic diseases: Secondary | ICD-10-CM | POA: Diagnosis not present

## 2023-09-07 DIAGNOSIS — E785 Hyperlipidemia, unspecified: Secondary | ICD-10-CM | POA: Diagnosis not present

## 2023-09-07 DIAGNOSIS — R768 Other specified abnormal immunological findings in serum: Secondary | ICD-10-CM | POA: Diagnosis not present

## 2023-09-07 DIAGNOSIS — M549 Dorsalgia, unspecified: Secondary | ICD-10-CM | POA: Insufficient documentation

## 2023-09-07 DIAGNOSIS — R7989 Other specified abnormal findings of blood chemistry: Secondary | ICD-10-CM | POA: Insufficient documentation

## 2023-09-07 NOTE — Progress Notes (Signed)
[facility] [address] [place] [address]   Clinic Day:  09/07/2023  Referring physician: Gilmore Laroche, FNP  Patient Care Team: Gilmore Laroche, FNP as PCP - General (Family Medicine) Doreatha Massed, MD as Medical Oncologist (Hematology)   ASSESSMENT & PLAN:   Assessment:  1.  Elevated ferritin and IgA levels: - Followed with GI for likely alcohol related to fatty liver disease - Elevated ferritin levels between 400-588 since January 2023 - Hemochromatosis mutation testing 03/2022: Negative - IgA levels elevated since 08/2021 [phone]) - US abdomen with elastography (10/07/2022): Diffuse fatty infiltration. - She Cooks in cast iron pans daily. - No fevers.  Occasional sweating in the head/legs but not drenching type.  Lost 20 pounds in the last 2 months after being on Mounjaro and now Ozempic.  No recurrent infections.  2.  Social/family history: - She is independent of ADLs and IADLs.  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  Reports exposure to chemicals.  She has been on disability since back surgery.  Non-smoker.  She used to drink beer daily until 3 months ago.  She now drinks occasional glass of wine or a mixed drink. - Mother had hepatitis.  Paternal grandfather had stomach cancer.  Plan:  1.  Elevated IgA levels: - Initial hematology workup from 02/08/23 show a positive ANA with speckled pattern and normal RF. Iron studies WNL. Kappa free light chains continue to be slightly elevated at 27.6 with a normal Lambda free light chains and kappa, lambda light chain ratio. Sed rate slightly elevated at 30 with normal c-reative protein. Immunofixation from 09/12/22 shows polyclonal increase detected in 1 or more immunoglobulins. -UPEP WNL.  -Increase in IgA level is likely secondary to elevated ANA / chronic inflammatory process.  -IgA levels continue to be elevated but have improved from previous lab draw.  Lab work from  08/21/2023 showed an IgA level of 465 (561). -We discussed no additional workup needed.  May be discharged from our clinic.  2.  Elevated ferritin levels: - Hemochromatosis workup was negative in August 2023. - Labs from 08/21/2023 show ferritin of 155.  Iron saturation is 39%.  TIBC WNL. -Recommend annual follow-up with PCP for ferritin draws.  If ferritin trends back up and is persistently elevated, patient to be referred back or have abdominal imaging of the liver. -She may be followed by her PCP for additional ferritin draws.  3.  Positive ANA: -Discussed findings.  Does not feel she needs referral to rheumatology at this time. -Discussed if she develops any new concerning symptoms, we can refer at that time.  No orders of the defined types were placed in this encounter.  PLAN SUMMARY: >> Recommend annual ferritin levels with PCP. >> Patient may be discharged from our clinic.  She knows she can return within 2 years without a new referral.    I spent 20 minutes dedicated to the care of this patient (face-to-face and non-face-to-face) on the date of the encounter to include what is described in the assessment and plan.   Mauro Kaufmann, NP   1/16/20251:42 PM  CHIEF COMPLAINT/PURPOSE OF CONSULT:   Diagnosis: elevated ferritin  Current Therapy: Surveillance  HISTORY OF PRESENT ILLNESS:   Laura Scott is a 63 y.o. female presenting to clinic today for follow-up.  She was last seen in clinic on 03/21/2023.  Since her last visit, she denies any hospitalizations, surgeries or changes to her baseline health.  She  was evaluated by endocrinology on 05/23/2023 for controlled symptomatic diabetes.  She is on Mounjaro for weight loss and diabetes management.  She started back on this 2 weeks ago and has noticed a decrease in her appetite.  Reports weight gain from the holidays.  Appetite is 50% (Mounjaro) and energy levels are 25%.  No pain.  Reports overall feeling well since her last  visit.  PAST MEDICAL HISTORY:   Past Medical History: Past Medical History:  Diagnosis Date   Allergic rhinitis    Anxiety    Chronic pain    Depression    Diabetes mellitus without complication (HCC)    GERD (gastroesophageal reflux disease)    Hyperlipidemia    Hypertension    IBS (irritable bowel syndrome)    PMS (premenstrual syndrome)    Reflux     Surgical History: Past Surgical History:  Procedure Laterality Date   BREAST BIOPSY Right 12/07/2022   MM RT BREAST BX W LOC DEV 1ST LESION IMAGE BX SPEC STEREO GUIDE 12/07/2022 [facility]   COLONOSCOPY  08/29/2011   [name]: normal. high risk screening tcs in 5 years.    COLONOSCOPY WITH PROPOFOL N/A 10/20/2016   Procedure: COLONOSCOPY WITH PROPOFOL;  Surgeon: Corbin Ade, MD;  [facility];  Service: Endoscopy;  Laterality: N/A;  10:45am   COLONOSCOPY WITH PROPOFOL N/A 02/09/2022   Procedure: COLONOSCOPY WITH PROPOFOL;  Surgeon: Corbin Ade, MD;  [facility]: [facility];  Service: Endoscopy;  Laterality: N/A;  2:30 / ASA 2   ESOPHAGOGASTRODUODENOSCOPY (EGD) WITH PROPOFOL N/A 10/20/2016   Procedure: ESOPHAGOGASTRODUODENOSCOPY (EGD) WITH PROPOFOL;  Surgeon: Corbin Ade, MD;  [facility];  Service: Endoscopy;  Laterality: N/A;   LUMBAR DISC SURGERY     MALONEY DILATION N/A 10/20/2016   Procedure: Elease Hashimoto DILATION;  Surgeon: Corbin Ade, MD;  [facility];  Service: Endoscopy;  Laterality: N/A;   POLYPECTOMY  10/20/2016   Procedure: POLYPECTOMY;  Surgeon: Corbin Ade, MD;  [facility];  Service: Endoscopy;;  cecal and sigmoid polypectomies    Social History: Social History   Socioeconomic History   Marital status: Divorced    Spouse name: Not on file   Number of children: Not on file   Years of education: Not on file   Highest education level: Not on file  Occupational History   Not on file  Tobacco Use   Smoking status: Never    Passive exposure: Current    Smokeless tobacco: Never  Vaping Use   Vaping status: Never Used  Substance and Sexual Activity   Alcohol use: Yes    Comment: occ   Drug use: No   Sexual activity: Yes    Birth control/protection: None, Post-menopausal  Other Topics Concern   Not on file  Social History Narrative   Lives with her brother. Pt is on disability from her lumbar fusion.    Social Drivers of Corporate investment banker Strain: Not on file  Food Insecurity: Not on file  Transportation Needs: Not on file  Physical Activity: Not on file  Stress: Not on file  Social Connections: Not on file  Intimate Partner Violence: Not on file    Family History: Family History  Problem Relation Age of Onset   Hypertension Mother    Colon polyps Mother    Hypertension Father    Diabetes Father    Hypertension Sister    Hyperlipidemia Sister    Colon polyps  Brother    Arthritis Brother    Diabetes Maternal Aunt    Hypertension Maternal Aunt    Rheum arthritis Maternal Grandmother    Prostate cancer Maternal Grandfather    Cancer Paternal Grandfather        not sure of type of cancer   Breast cancer Neg Hx    Cervical cancer Neg Hx     Current Medications:  Current Outpatient Medications:    albuterol (VENTOLIN HFA) 108 (90 Base) MCG/ACT inhaler, Inhale 1-2 puffs into the lungs every 6 (six) hours as needed for wheezing or shortness of breath., Disp: 18 g, Rfl: 0   ALPRAZolam (XANAX) 0.25 MG tablet, Take 1 tablet (0.25 mg total) by mouth as needed for anxiety., Disp: 10 tablet, Rfl: 0   atorvastatin (LIPITOR) 40 MG tablet, Take 1 tablet (40 mg total) by mouth daily., Disp: 90 tablet, Rfl: 3   budesonide-formoterol (SYMBICORT) 160-4.5 MCG/ACT inhaler, Inhale 2 puffs into the lungs 2 (two) times daily. (Patient taking differently: Inhale 2 puffs into the lungs daily as needed (Shortness of breath).), Disp: 1 Inhaler, Rfl: 5   Cholecalciferol (VITAMIN D) 125 MCG (5000 UT) CAPS, Take 5,000 Units by mouth daily  with breakfast., Disp: 30 capsule, Rfl: 2   diclofenac Sodium (VOLTAREN) 1 % GEL, Apply 2-4 grams to affected joint 4 times daily as needed., Disp: 400 g, Rfl: 2   escitalopram (LEXAPRO) 20 MG tablet, Take 1 tablet (20 mg total) by mouth daily., Disp: 90 tablet, Rfl: 1   fluticasone (FLONASE) 50 MCG/ACT nasal spray, Place 2 sprays into both nostrils daily. (Patient taking differently: Place 2 sprays into both nostrils daily as needed for allergies.), Disp: 48 g, Rfl: 5   glucose blood (ACCU-CHEK GUIDE) test strip, CHECK BLOOD SUGAR ONCE  DAILY, Disp: 100 strip, Rfl: 3   hydrochlorothiazide (HYDRODIURIL) 25 MG tablet, TAKE 1 TABLET BY MOUTH ONCE DAILY AS NEEDED, Disp: 90 tablet, Rfl: 1   hydrOXYzine (VISTARIL) 50 MG capsule, Take 1 capsule (50 mg total) by mouth at bedtime as needed., Disp: 30 capsule, Rfl: 0   losartan (COZAAR) 50 MG tablet, Take 1 tablet (50 mg total) by mouth daily., Disp: 90 tablet, Rfl: 1   metFORMIN (GLUCOPHAGE) 500 MG tablet, Take 1 tablet (500 mg total) by mouth 2 (two) times daily after a meal., Disp: 180 tablet, Rfl: 1   Omega-3 Fatty Acids (FISH OIL OMEGA-3) 1000 MG CAPS, Take 1 g by mouth daily., Disp: 90 capsule, Rfl: 1   omeprazole (PRILOSEC) 40 MG capsule, Take 1 capsule (40 mg total) by mouth daily., Disp: 90 capsule, Rfl: 0   Probiotic Product (PROBIOTIC DAILY PO), Take by mouth., Disp: , Rfl:    tirzepatide (MOUNJARO) 2.5 MG/0.5ML Pen, Inject 2.5 mg into the skin once a week., Disp: 2 mL, Rfl: 0   tiZANidine (ZANAFLEX) 4 MG tablet, Take 1 tablet (4 mg total) by mouth every 6 (six) hours as needed for muscle spasms., Disp: 30 tablet, Rfl: 0   Allergies: Allergies  Allergen Reactions   Sulfa Antibiotics Hives, Diarrhea and Itching   Other     SURGICAL TAPE Reaction: rash    Penicillins Hives, Itching and Rash    Pt states that she is able to take PCN  Has patient had a PCN reaction causing immediate rash, facial/tongue/throat swelling, SOB or lightheadedness  with hypotension: Yes Has patient had a PCN reaction causing severe rash involving mucus membranes or skin necrosis: No Has patient had a PCN reaction that  required hospitalization: No Has patient had a PCN reaction occurring within the last 10 years: Yes If all of the above answers are "NO", then may proceed with Cephalosporin use.    REVIEW OF SYSTEMS:   Review of Systems  Constitutional:  Positive for unexpected weight change (weight gain). Negative for fatigue.     VITALS:   Last menstrual period 09/03/2013.  Wt Readings from Last 3 Encounters:  07/31/23 168 lb 1.9 oz (76.3 kg)  05/23/23 156 lb 3.2 oz (70.9 kg)  03/29/23 154 lb 1.3 oz (69.9 kg)    There is no height or weight on file to calculate BMI.   PHYSICAL EXAM:   Physical Exam Constitutional:      Appearance: Normal appearance.  HENT:     Head: Normocephalic and atraumatic.  Eyes:     Pupils: Pupils are equal, round, and reactive to light.  Cardiovascular:     Rate and Rhythm: Normal rate and regular rhythm.     Heart sounds: Normal heart sounds. No murmur heard. Pulmonary:     Effort: Pulmonary effort is normal.     Breath sounds: Normal breath sounds. No wheezing.  Abdominal:     General: Bowel sounds are normal. There is no distension.     Palpations: Abdomen is soft.     Tenderness: There is no abdominal tenderness.  Musculoskeletal:        General: Normal range of motion.     Cervical back: Normal range of motion.  Skin:    General: Skin is warm and dry.     Findings: No rash.  Neurological:     Mental Status: She is alert and oriented to person, place, and time.  Psychiatric:        Judgment: Judgment normal.    LABS:      Latest Ref Rng & Units 08/21/2023    9:53 AM 08/02/2023    1:13 PM 05/02/2023    9:40 AM  CBC  WBC 4.0 - 10.5 K/uL 5.7  4.9  6.1   Hemoglobin 12.0 - 15.0 g/dL 09.8  11.9  14.7   Hematocrit 36.0 - 46.0 % 37.4  36.6  37.5   Platelets 150 - 400 K/uL 312  244  333        Latest Ref Rng & Units 08/21/2023    9:53 AM 08/02/2023    1:13 PM 05/02/2023    9:40 AM  CMP  Glucose 70 - 99 mg/dL 829  562    BUN 8 - 23 mg/dL 16  14    Creatinine 1.30 - 1.00 mg/dL 8.65  7.84    Sodium 696 - 145 mmol/L 139  140    Potassium 3.5 - 5.1 mmol/L 3.8  4.1  4.0   Chloride 98 - 111 mmol/L 101  100    CO2 22 - 32 mmol/L 28  21    Calcium 8.9 - 10.3 mg/dL 9.5  9.6    Total Protein 6.5 - 8.1 g/dL 7.9  7.3    Total Bilirubin 0.0 - 1.2 mg/dL 0.6  0.7    Alkaline Phos 38 - 126 U/L 59  79    AST 15 - 41 U/L 41  30    ALT 0 - 44 U/L 39  27       No results found for: "CEA1", "CEA" / No results found for: "CEA1", "CEA" No results found for: "PSA1" No results found for: "EXB284" No results found for: "XLK440"  Lab Results  Component Value Date   ALBUMINELP 4.3 09/12/2022   A1GS 0.3 12/02/2021   A2GS 0.8 12/02/2021   BETS 0.5 12/02/2021   BETA2SER 0.5 12/02/2021   GAMS 1.2 12/02/2021   MSPIKE Not Observed 09/12/2022   SPEI  12/02/2021     Comment:     Normal Serum Protein Electrophoresis Pattern. No abnormal protein bands (M-protein) detected.    Lab Results  Component Value Date   TIBC 343 08/21/2023   TIBC 270 03/27/2023   TIBC 324 01/27/2023   FERRITIN 156 08/21/2023   FERRITIN 264 (H) 03/27/2023   FERRITIN 263 01/27/2023   IRONPCTSAT 39 (H) 08/21/2023   IRONPCTSAT 39 03/27/2023   IRONPCTSAT 28 01/27/2023   No results found for: "LDH"   STUDIES:   No results found.    - Hemochromatosis testing negative

## 2023-09-19 ENCOUNTER — Other Ambulatory Visit: Payer: Self-pay | Admitting: Family Medicine

## 2023-09-19 DIAGNOSIS — E1165 Type 2 diabetes mellitus with hyperglycemia: Secondary | ICD-10-CM

## 2023-09-20 ENCOUNTER — Ambulatory Visit (INDEPENDENT_AMBULATORY_CARE_PROVIDER_SITE_OTHER): Payer: PPO

## 2023-09-20 VITALS — Ht 65.5 in | Wt 167.0 lb

## 2023-09-20 DIAGNOSIS — E1165 Type 2 diabetes mellitus with hyperglycemia: Secondary | ICD-10-CM

## 2023-09-20 DIAGNOSIS — Z0100 Encounter for examination of eyes and vision without abnormal findings: Secondary | ICD-10-CM

## 2023-09-20 DIAGNOSIS — Z1231 Encounter for screening mammogram for malignant neoplasm of breast: Secondary | ICD-10-CM

## 2023-09-20 DIAGNOSIS — Z0000 Encounter for general adult medical examination without abnormal findings: Secondary | ICD-10-CM

## 2023-09-20 NOTE — Patient Instructions (Signed)
[name] , Thank you for taking time to come for your Medicare Wellness Visit. I appreciate your ongoing commitment to your health goals. Please review the following plan we discussed and let me know if I can assist you in the future.   Referrals/Orders/Follow-Ups/Clinician Recommendations:  Next Medicare Annual Wellness Visit:September 24, 2024 at 8:00 am virtual visit  You have an order for:  []   2D Mammogram  [x]   3D Mammogram  []   Bone Density   []   Lung Cancer Screening  Please call for appointment:   [facility] at [facility] [address] [place], [place] [address] [phone]  Make sure to wear two-piece clothing.  No lotions powders or deodorants the day of the appointment Make sure to bring picture ID and insurance card.  Bring list of medications you are currently taking including any supplements.   Schedule your [place] screening mammogram through MyChart!   Log into your MyChart account.  Go to 'Visit' (or 'Appointments' if on mobile App) --> Schedule an Appointment  Under 'Select a Reason for Visit' choose the Mammogram Screening option.  Complete the pre-visit questions and select the time and place that best fits your schedule.  You've been referred to see Dr. Sinda Du for an eye exam. If you haven't heard from his office in about a week, please call to schedule your appointment.   Sinda Du, MD [address] [place] [address]  Phone: [phone]   This is a list of the screening recommended for you and due dates:  Health Maintenance  Topic Date Due   DTaP/Tdap/Td vaccine (1 - Tdap) Never done   Zoster (Shingles) Vaccine (1 of 2) Never done   Pneumococcal Vaccination (2 of 2 - PCV) 08/21/2019   Eye exam for diabetics  11/17/2022   COVID-19 Vaccine (5 - 2024-25 season) 04/23/2023   Mammogram  11/21/2023   Complete foot exam   11/29/2023   Hemoglobin A1C  01/31/2024   Yearly kidney health urinalysis for  diabetes  08/01/2024   Yearly kidney function blood test for diabetes  08/20/2024   Medicare Annual Wellness Visit  09/19/2024   Pap with HPV screening  11/13/2026   Colon Cancer Screening  02/10/2027   Flu Shot  Completed   Hepatitis C Screening  Completed   HIV Screening  Completed   HPV Vaccine  Aged Out    Advanced directives: (ACP Link)Information on Advanced Care Planning can be found at [facility] Advance Health Care Directives Advance Health Care Directives ([email or web address]   Next Medicare Annual Wellness Visit scheduled for next year: yes  Preventive Care 79-68 Years Old, Female Preventive care refers to lifestyle choices and visits with your health care provider that can promote health and wellness. Preventive care visits are also called wellness exams. What can I expect for my preventive care visit? Counseling Your health care provider may ask you questions about your: Medical history, including: Past medical problems. Family medical history. Pregnancy history. Current health, including: Menstrual cycle. Method of birth control. Emotional well-being. Home life and relationship well-being. Sexual activity and sexual health. Lifestyle, including: Alcohol, nicotine or tobacco, and drug use. Access to firearms. Diet, exercise, and sleep habits. Work and work Astronomer. Sunscreen use. Safety issues such as seatbelt and bike helmet use. Physical exam Your health care provider will check your: Height and weight. These may be used to calculate your BMI (body mass index). BMI is a measurement that  tells if you are at a healthy weight. Waist circumference. This measures the distance around your waistline. This measurement also tells if you are at a healthy weight and may help predict your risk of certain diseases, such as type 2 diabetes and high blood pressure. Heart rate and blood pressure. Body temperature. Skin for abnormal spots. What  immunizations do I need?  Vaccines are usually given at various ages, according to a schedule. Your health care provider will recommend vaccines for you based on your age, medical history, and lifestyle or other factors, such as travel or where you work. What tests do I need? Screening Your health care provider may recommend screening tests for certain conditions. This may include: Lipid and cholesterol levels. Diabetes screening. This is done by checking your blood sugar (glucose) after you have not eaten for a while (fasting). Pelvic exam and Pap test. Hepatitis B test. Hepatitis C test. HIV (human immunodeficiency virus) test. STI (sexually transmitted infection) testing, if you are at risk. Lung cancer screening. Colorectal cancer screening. Mammogram. Talk with your health care provider about when you should start having regular mammograms. This may depend on whether you have a family history of breast cancer. BRCA-related cancer screening. This may be done if you have a family history of breast, ovarian, tubal, or peritoneal cancers. Bone density scan. This is done to screen for osteoporosis. Talk with your health care provider about your test results, treatment options, and if necessary, the need for more tests. Follow these instructions at home: Eating and drinking  Eat a diet that includes fresh fruits and vegetables, whole grains, lean protein, and low-fat dairy products. Take vitamin and mineral supplements as recommended by your health care provider. Do not drink alcohol if: Your health care provider tells you not to drink. You are pregnant, may be pregnant, or are planning to become pregnant. If you drink alcohol: Limit how much you have to 0-1 drink a day. Know how much alcohol is in your drink. In the [place] one drink equals one 12 oz bottle of beer (355 mL), one 5 oz glass of wine (148 mL), or one 1 oz glass of hard liquor (44 mL). Lifestyle Brush your teeth every  morning and night with fluoride toothpaste. Floss one time each day. Exercise for at least 30 minutes 5 or more days each week. Do not use any products that contain nicotine or tobacco. These products include cigarettes, chewing tobacco, and vaping devices, such as e-cigarettes. If you need help quitting, ask your health care provider. Do not use drugs. If you are sexually active, practice safe sex. Use a condom or other form of protection to prevent STIs. If you do not wish to become pregnant, use a form of birth control. If you plan to become pregnant, see your health care provider for a prepregnancy visit. Take aspirin only as told by your health care provider. Make sure that you understand how much to take and what form to take. Work with your health care provider to find out whether it is safe and beneficial for you to take aspirin daily. Find healthy ways to manage stress, such as: Meditation, yoga, or listening to music. Journaling. Talking to a trusted person. Spending time with friends and family. Minimize exposure to UV radiation to reduce your risk of skin cancer. Safety Always wear your seat belt while driving or riding in a vehicle. Do not drive: If you have been drinking alcohol. Do not ride with someone who has been  drinking. When you are tired or distracted. While texting. If you have been using any mind-altering substances or drugs. Wear a helmet and other protective equipment during sports activities. If you have firearms in your house, make sure you follow all gun safety procedures. Seek help if you have been physically or sexually abused. What's next? Visit your health care provider once a year for an annual wellness visit. Ask your health care provider how often you should have your eyes and teeth checked. Stay up to date on all vaccines. This information is not intended to replace advice given to you by your health care provider. Make sure you discuss any questions  you have with your health care provider. Document Revised: 02/03/2021 Document Reviewed: 02/03/2021 Elsevier Patient Education  2024 [employer].  Understanding Your Risk for Falls Millions of people have serious injuries from falls each year. It is important to understand your risk of falling. Talk with your health care provider about your risk and what you can do to lower it. If you do have a serious fall, make sure to tell your provider. Falling once raises your risk of falling again. How can falls affect me? Serious injuries from falls are common. These include: Broken bones, such as hip fractures. Head injuries, such as traumatic brain injuries (TBI) or concussions. A fear of falling can cause you to avoid activities and stay at home. This can make your muscles weaker and raise your risk for a fall. What can increase my risk? There are a number of risk factors that increase your risk for falling. The more risk factors you have, the higher your risk of falling. Serious injuries from a fall happen most often to people who are older than 63 years old. Teenagers and young adults ages 17-29 are also at higher risk. Common risk factors include: Weakness in the lower body. Being generally weak or confused due to long-term (chronic) illness. Dizziness or balance problems. Poor vision. Medicines that cause dizziness or drowsiness. These may include: Medicines for your blood pressure, heart, anxiety, insomnia, or swelling (edema). Pain medicines. Muscle relaxants. Other risk factors include: Drinking alcohol. Having had a fall in the past. Having foot pain or wearing improper footwear. Working at a dangerous job. Having any of the following in your home: Tripping hazards, such as floor clutter or loose rugs. Poor lighting. Pets. Having dementia or memory loss. What actions can I take to lower my risk of falling?     Physical activity Stay physically fit. Do strength and balance  exercises. Consider taking a regular class to build strength and balance. Yoga and tai chi are good options. Vision Have your eyes checked every year and your prescription for glasses or contacts updated as needed. Shoes and walking aids Wear non-skid shoes. Wear shoes that have rubber soles and low heels. Do not wear high heels. Do not walk around the house in socks or slippers. Use a cane or walker as told by your provider. Home safety Attach secure railings on both sides of your stairs. Install grab bars for your bathtub, shower, and toilet. Use a non-skid mat in your bathtub or shower. Attach bath mats securely with double-sided, non-slip rug tape. Use good lighting in all rooms. Keep a flashlight near your bed. Make sure there is a clear path from your bed to the bathroom. Use night-lights. Do not use throw rugs. Make sure all carpeting is taped or tacked down securely. Remove all clutter from walkways and stairways, including extension  cords. Repair uneven or broken steps and floors. Avoid walking on icy or slippery surfaces. Walk on the grass instead of on icy or slick sidewalks. Use ice melter to get rid of ice on walkways in the winter. Use a cordless phone. Questions to ask your health care provider Can you help me check my risk for a fall? Do any of my medicines make me more likely to fall? Should I take a vitamin D supplement? What exercises can I do to improve my strength and balance? Should I make an appointment to have my vision checked? Do I need a bone density test to check for weak bones (osteoporosis)? Would it help to use a cane or a walker? Where to find more information Centers for Disease Control and Prevention, [employer]: TonerPromos.no Community-Based Fall Prevention Programs: TonerPromos.no [employer] on Aging: BaseRingTones.pl Contact a health care provider if: You fall at home. You are afraid of falling at home. You feel weak, drowsy, or dizzy. This information is not  intended to replace advice given to you by your health care provider. Make sure you discuss any questions you have with your health care provider. Document Revised: 04/11/2022 Document Reviewed: 04/11/2022 Elsevier Patient Education  2024 [employer].

## 2023-09-20 NOTE — Progress Notes (Signed)
Please attest and cosign this visit due to patients primary care provider not being in the office at the time the visit was completed.   Because this visit was a virtual/telehealth visit,  certain criteria was not obtained, such a blood pressure, CBG if applicable, and timed get up and go. Any medications not marked as "taking" were not mentioned during the medication reconciliation part of the visit. Any vitals not documented were not able to be obtained due to this being a telehealth visit or patient was unable to self-report a recent blood pressure reading due to a lack of equipment at home via telehealth. Vitals that have been documented are verbally provided by the patient.  Interactive audio and video telecommunications were attempted between this provider and patient, however failed, due to patient having technical difficulties OR patient did not have access to video capability.  We continued and completed visit with audio only.  Subjective:   Laura Scott is a 63 y.o. female who presents for Medicare Annual (Subsequent) preventive examination.  Visit Complete: Virtual I connected with  Laura Scott on 09/20/23 by a audio enabled telemedicine application and verified that I am speaking with the correct person using two identifiers.  Patient Location: Home  Provider Location: Home Office  I discussed the limitations of evaluation and management by telemedicine. The patient expressed understanding and agreed to proceed.  Vital Signs: Because this visit was a virtual/telehealth visit, some criteria may be missing or patient reported. Any vitals not documented were not able to be obtained and vitals that have been documented are patient reported.  Patient Medicare AWV questionnaire was completed by the patient on na; I have confirmed that all information answered by patient is correct and no changes since this date.  Cardiac Risk Factors include: advanced age (>53men, >67  women);diabetes mellitus;dyslipidemia;hypertension;sedentary lifestyle     Objective:    Today's Vitals   09/20/23 1043  Weight: 167 lb (75.8 kg)  Height: 5' 5.5" (1.664 m)   Body mass index is 27.37 kg/m.     09/20/2023   10:35 AM 09/07/2023    1:40 PM 02/14/2023    9:52 AM 01/27/2023   10:33 AM 09/12/2022    1:19 PM 02/09/2022    9:24 AM 03/15/2017   12:26 PM  Advanced Directives  Does Patient Have a Medical Advance Directive? No No No No No No No  Would patient like information on creating a medical advance directive? No - Patient declined No - Patient declined No - Patient declined No - Patient declined  No - Patient declined No - Patient declined    Current Medications (verified) Outpatient Encounter Medications as of 09/20/2023  Medication Sig   albuterol (VENTOLIN HFA) 108 (90 Base) MCG/ACT inhaler Inhale 1-2 puffs into the lungs every 6 (six) hours as needed for wheezing or shortness of breath.   ALPRAZolam (XANAX) 0.25 MG tablet Take 1 tablet (0.25 mg total) by mouth as needed for anxiety.   atorvastatin (LIPITOR) 40 MG tablet Take 1 tablet (40 mg total) by mouth daily.   budesonide-formoterol (SYMBICORT) 160-4.5 MCG/ACT inhaler Inhale 2 puffs into the lungs 2 (two) times daily. (Patient taking differently: Inhale 2 puffs into the lungs daily as needed (Shortness of breath).)   Cholecalciferol (VITAMIN D) 125 MCG (5000 UT) CAPS Take 5,000 Units by mouth daily with breakfast.   diclofenac Sodium (VOLTAREN) 1 % GEL Apply 2-4 grams to affected joint 4 times daily as needed.   escitalopram (LEXAPRO) 20 MG  tablet Take 1 tablet (20 mg total) by mouth daily.   fluticasone (FLONASE) 50 MCG/ACT nasal spray Place 2 sprays into both nostrils daily. (Patient taking differently: Place 2 sprays into both nostrils daily as needed for allergies.)   glucose blood (ACCU-CHEK GUIDE) test strip CHECK BLOOD SUGAR ONCE  DAILY   hydrochlorothiazide (HYDRODIURIL) 25 MG tablet TAKE 1 TABLET BY MOUTH  ONCE DAILY AS NEEDED   hydrOXYzine (VISTARIL) 50 MG capsule Take 1 capsule (50 mg total) by mouth at bedtime as needed.   losartan (COZAAR) 50 MG tablet Take 1 tablet (50 mg total) by mouth daily.   metFORMIN (GLUCOPHAGE) 500 MG tablet Take 1 tablet (500 mg total) by mouth 2 (two) times daily after a meal.   Omega-3 Fatty Acids (FISH OIL OMEGA-3) 1000 MG CAPS Take 1 g by mouth daily.   omeprazole (PRILOSEC) 40 MG capsule Take 1 capsule (40 mg total) by mouth daily.   Probiotic Product (PROBIOTIC DAILY PO) Take by mouth.   tirzepatide (MOUNJARO) 2.5 MG/0.5ML Pen Inject 2.5 mg into the skin once a week.   tiZANidine (ZANAFLEX) 4 MG tablet Take 1 tablet (4 mg total) by mouth every 6 (six) hours as needed for muscle spasms.   No facility-administered encounter medications on file as of 09/20/2023.    Allergies (verified) Sulfa antibiotics, Other, and Penicillins   History: Past Medical History:  Diagnosis Date   Allergic rhinitis    Anxiety    Chronic pain    Depression    Diabetes mellitus without complication (HCC)    GERD (gastroesophageal reflux disease)    Hyperlipidemia    Hypertension    IBS (irritable bowel syndrome)    PMS (premenstrual syndrome)    Reflux    Past Surgical History:  Procedure Laterality Date   BREAST BIOPSY Right 12/07/2022   MM RT BREAST BX W LOC DEV 1ST LESION IMAGE BX SPEC STEREO GUIDE 12/07/2022 [facility]   COLONOSCOPY  08/29/2011   [name]: normal. high risk screening tcs in 5 years.    COLONOSCOPY WITH PROPOFOL N/A 10/20/2016   Procedure: COLONOSCOPY WITH PROPOFOL;  Surgeon: Corbin Ade, MD;  Location: [facility];  Service: Endoscopy;  Laterality: N/A;  10:45am   COLONOSCOPY WITH PROPOFOL N/A 02/09/2022   Procedure: COLONOSCOPY WITH PROPOFOL;  Surgeon: Corbin Ade, MD;  Location: [facility];  Service: Endoscopy;  Laterality: N/A;  2:30 / ASA 2   ESOPHAGOGASTRODUODENOSCOPY (EGD) WITH PROPOFOL N/A 10/20/2016   Procedure:  ESOPHAGOGASTRODUODENOSCOPY (EGD) WITH PROPOFOL;  Surgeon: Corbin Ade, MD;  Location: [facility];  Service: Endoscopy;  Laterality: N/A;   LUMBAR DISC SURGERY     MALONEY DILATION N/A 10/20/2016   Procedure: Elease Hashimoto DILATION;  Surgeon: Corbin Ade, MD;  Location: [facility];  Service: Endoscopy;  Laterality: N/A;   POLYPECTOMY  10/20/2016   Procedure: POLYPECTOMY;  Surgeon: Corbin Ade, MD;  Location: [facility];  Service: Endoscopy;;  cecal and sigmoid polypectomies   Family History  Problem Relation Age of Onset   Hypertension Mother    Colon polyps Mother    Hypertension Father    Diabetes Father    Hypertension Sister    Hyperlipidemia Sister    Colon polyps Brother    Arthritis Brother    Diabetes Maternal Aunt    Hypertension Maternal Aunt    Rheum arthritis Maternal Grandmother    Prostate cancer Maternal Grandfather    Cancer Paternal Grandfather  not sure of type of cancer   Breast cancer Neg Hx    Cervical cancer Neg Hx    Social History   Socioeconomic History   Marital status: Divorced    Spouse name: Not on file   Number of children: Not on file   Years of education: Not on file   Highest education level: Not on file  Occupational History   Not on file  Tobacco Use   Smoking status: Never    Passive exposure: Current   Smokeless tobacco: Never  Vaping Use   Vaping status: Never Used  Substance and Sexual Activity   Alcohol use: Yes    Comment: occ   Drug use: No   Sexual activity: Yes    Birth control/protection: None, Post-menopausal  Other Topics Concern   Not on file  Social History Narrative   Lives with her brother. Pt is on disability from her lumbar fusion.    Social Drivers of Corporate investment banker Strain: Low Risk  (09/20/2023)   Overall Financial Resource Strain (CARDIA)    Difficulty of Paying Living Expenses: Not hard at all  Food Insecurity: No Food Insecurity (09/20/2023)   Hunger Vital Sign    Worried  About Running Out of Food in the Last Year: Never true    Ran Out of Food in the Last Year: Never true  Transportation Needs: No Transportation Needs (09/20/2023)   PRAPARE - Administrator, Civil Service (Medical): No    Lack of Transportation (Non-Medical): No  Physical Activity: Inactive (09/20/2023)   Exercise Vital Sign    Days of Exercise per Week: 0 days    Minutes of Exercise per Session: 0 min  Stress: No Stress Concern Present (09/20/2023)   [employer] of Occupational Health - Occupational Stress Questionnaire    Feeling of Stress : Only a little  Social Connections: Moderately Isolated (09/20/2023)   Social Connection and Isolation Panel [NHANES]    Frequency of Communication with Friends and Family: More than three times a week    Frequency of Social Gatherings with Friends and Family: More than three times a week    Attends Religious Services: More than 4 times per year    Active Member of [employer] or Organizations: No    Attends Engineer, structural: Never    Marital Status: Divorced    Tobacco Counseling Counseling given: Yes   Clinical Intake:  Pre-visit preparation completed: Yes  Pain : No/denies pain     BMI - recorded: 27.37 Nutritional Risks: None Diabetes: Yes CBG done?: No Did pt. bring in CBG monitor from home?: No  How often do you need to have someone help you when you read instructions, pamphlets, or other written materials from your doctor or pharmacy?: 1 - Never  Interpreter Needed?: No  Information entered by :: Maryjean Ka CMA   Activities of Daily Living    09/20/2023   10:49 AM  In your present state of health, do you have any difficulty performing the following activities:  Hearing? 0  Vision? 0  Difficulty concentrating or making decisions? 0  Walking or climbing stairs? 0  Dressing or bathing? 0  Doing errands, shopping? 0  Preparing Food and eating ? N  Using the Toilet? N  In the past six months, have you  accidently leaked urine? N  Do you have problems with loss of bowel control? N  Managing your Medications? N  Managing your Finances? N  Housekeeping  or managing your Housekeeping? N    Patient Care Team: Gilmore Laroche, FNP as PCP - General (Family Medicine) Doreatha Massed, MD as Medical Oncologist (Hematology)  Indicate any recent Medical Services you may have received from other than [facility] providers in the past year (date may be approximate).     Assessment:   This is a routine wellness examination for Laura Scott.  Hearing/Vision screen Hearing Screening - Comments:: Patient denies any hearing difficulties.   Vision Screening - Comments:: Patient is up to date on her yearly eye exams. Patient wears glasses mostly for driving. She request a referral to a provider that is able to provide a diabetic eye exam. Referral placed for patient today.    Goals Addressed             This Visit's Progress    Patient Stated       Lose weight and get down to about 150lbs, increase my physical activity and        Depression Screen    09/20/2023   10:49 AM 03/29/2023   10:18 AM 03/29/2023   10:09 AM 12/27/2022    3:03 PM 11/29/2022    2:06 PM 09/12/2022    1:22 PM 08/26/2022    1:16 PM  PHQ 2/9 Scores  PHQ - 2 Score 0 3 0 0 2 0 3  PHQ- 9 Score 0 11 0 7 11  16     Fall Risk    09/20/2023   10:47 AM 03/29/2023   10:09 AM 12/27/2022    3:01 PM 11/29/2022    2:06 PM 09/12/2022    1:21 PM  Fall Risk   Falls in the past year? 1 0 0 0 0  Number falls in past yr: 0 0 0 0 0  Injury with Fall? 1 0 0 0 0  Risk for fall due to : History of fall(s) No Fall Risks No Fall Risks No Fall Risks   Follow up Falls prevention discussed;Education provided Falls evaluation completed Falls evaluation completed Falls evaluation completed     MEDICARE RISK AT HOME: Medicare Risk at Home Any stairs in or around the home?: No If so, are there any without handrails?: No Home free of loose throw rugs in  walkways, pet beds, electrical cords, etc?: Yes Adequate lighting in your home to reduce risk of falls?: Yes Life alert?: No Use of a cane, walker or w/c?: No Grab bars in the bathroom?: No Shower chair or bench in shower?: No Elevated toilet seat or a handicapped toilet?: No  TIMED UP AND GO:  Was the test performed?  No    Cognitive Function:        09/20/2023   10:46 AM 09/12/2022    1:22 PM  6CIT Screen  What Year? 0 points 0 points  What month? 0 points 0 points  What time? 0 points 0 points  Count back from 20 0 points 0 points  Months in reverse 0 points 4 points  Repeat phrase 0 points 2 points  Total Score 0 points 6 points    Immunizations Immunization History  Administered Date(s) Administered   Influenza, Seasonal, Injecte, Preservative Fre 07/31/2023   Influenza,inj,Quad PF,6+ Mos 05/31/2018, 08/07/2021, 11/12/2021   Influenza-Unspecified 05/23/2011   Moderna Covid-19 Vaccine Bivalent Booster 20yrs & up 09/07/2021   Moderna Sars-Covid-2 Vaccination 11/02/2019, 12/04/2019, 08/06/2020   Pneumococcal Polysaccharide-23 08/20/2018    TDAP status: Due, Education has been provided regarding the importance of this vaccine. Advised may receive this vaccine  at local pharmacy or Health Dept. Aware to provide a copy of the vaccination record if obtained from local pharmacy or Health Dept. Verbalized acceptance and understanding.  Flu Vaccine status: Up to date  Pneumococcal vaccine status: Due, Education has been provided regarding the importance of this vaccine. Advised may receive this vaccine at local pharmacy or Health Dept. Aware to provide a copy of the vaccination record if obtained from local pharmacy or Health Dept. Verbalized acceptance and understanding.  Covid-19 vaccine status: Information provided on how to obtain vaccines.   Qualifies for Shingles Vaccine? Yes   Zostavax completed No   Shingrix Completed?: No.    Education has been provided regarding  the importance of this vaccine. Patient has been advised to call insurance company to determine out of pocket expense if they have not yet received this vaccine. Advised may also receive vaccine at local pharmacy or Health Dept. Verbalized acceptance and understanding.  Screening Tests Health Maintenance  Topic Date Due   DTaP/Tdap/Td (1 - Tdap) Never done   Zoster Vaccines- Shingrix (1 of 2) Never done   Pneumococcal Vaccine 57-72 Years old (2 of 2 - PCV) 08/21/2019   OPHTHALMOLOGY EXAM  11/17/2022   COVID-19 Vaccine (5 - 2024-25 season) 04/23/2023   Medicare Annual Wellness (AWV)  09/13/2023   FOOT EXAM  11/29/2023   HEMOGLOBIN A1C  01/31/2024   Diabetic kidney evaluation - Urine ACR  08/01/2024   Diabetic kidney evaluation - eGFR measurement  08/20/2024   MAMMOGRAM  11/20/2024   Cervical Cancer Screening (HPV/Pap Cotest)  11/13/2026   Colonoscopy  02/10/2027   INFLUENZA VACCINE  Completed   Hepatitis C Screening  Completed   HIV Screening  Completed   HPV VACCINES  Aged Out    Health Maintenance  Health Maintenance Due  Topic Date Due   DTaP/Tdap/Td (1 - Tdap) Never done   Zoster Vaccines- Shingrix (1 of 2) Never done   Pneumococcal Vaccine 52-78 Years old (2 of 2 - PCV) 08/21/2019   OPHTHALMOLOGY EXAM  11/17/2022   COVID-19 Vaccine (5 - 2024-25 season) 04/23/2023   Medicare Annual Wellness (AWV)  09/13/2023    Colorectal cancer screening: Type of screening: Colonoscopy. Completed 02/09/2022. Repeat every 5 years  Mammogram status: Ordered 09/20/2023. Pt provided with contact info and advised to call to schedule appt.   Bone Density Screening: Not age appropriate for this patient.    Lung Cancer Screening: (Low Dose CT Chest recommended if Age 27-80 years, 20 pack-year currently smoking OR have quit w/in 15years.) does not qualify.   Lung Cancer Screening Referral: na  Additional Screening:  Hepatitis C Screening: does not qualify; Completed   Vision Screening:  Recommended annual ophthalmology exams for early detection of glaucoma and other disorders of the eye. Is the patient up to date with their annual eye exam?  No  Who is the provider or what is the name of the office in which the patient attends annual eye exams? Referral placed for patient today If pt is not established with a provider, would they like to be referred to a provider to establish care? No .   Dental Screening: Recommended annual dental exams for proper oral hygiene  Diabetic Foot Exam: Diabetic Foot Exam: Completed 11/29/2022  Community Resource Referral / Chronic Care Management: CRR required this visit?  No   CCM required this visit?  No     Plan:     I have personally reviewed and noted the following in the patient's  chart:   Medical and social history Use of alcohol, tobacco or illicit drugs  Current medications and supplements including opioid prescriptions. Patient is not currently taking opioid prescriptions. Functional ability and status Nutritional status Physical activity Advanced directives List of other physicians Hospitalizations, surgeries, and ER visits in previous 12 months Vitals Screenings to include cognitive, depression, and falls Referrals and appointments  In addition, I have reviewed and discussed with patient certain preventive protocols, quality metrics, and best practice recommendations. A written personalized care plan for preventive services as well as general preventive health recommendations were provided to patient.     Laura Scott, CMA   09/20/2023   After Visit Summary: (MyChart) Due to this being a telephonic visit, the after visit summary with patients personalized plan was offered to patient via MyChart   Nurse Notes: see routing comment

## 2023-09-21 ENCOUNTER — Other Ambulatory Visit: Payer: Self-pay | Admitting: Family Medicine

## 2023-09-21 DIAGNOSIS — E1165 Type 2 diabetes mellitus with hyperglycemia: Secondary | ICD-10-CM

## 2023-09-21 MED ORDER — TIRZEPATIDE 5 MG/0.5ML ~~LOC~~ SOAJ: 5.0000 mg | SUBCUTANEOUS | 0 refills | Status: DC

## 2023-09-28 ENCOUNTER — Other Ambulatory Visit: Payer: Self-pay | Admitting: Family Medicine

## 2023-09-28 DIAGNOSIS — F419 Anxiety disorder, unspecified: Secondary | ICD-10-CM

## 2023-10-01 NOTE — Progress Notes (Deleted)
 GI Office Note    Referring Provider: Gilmore Laroche, FNP Primary Care Physician:  Gilmore Laroche, FNP  Primary Gastroenterologist Roetta Sessions, MD   Chief Complaint   No chief complaint on file.   History of Present Illness   Laura Scott is a 63 y.o. female presenting today for follow up. Last seen 12/2022. H/o elevated AST/ALT, ferritin. H/o alcohol related fatty liver***. H/o constipation.     Viral markers negative for chronic viral hepatitis;  ANA positive. Smooth muscle Ab neg, AMA negative. IgA elevated with normal IgG/IgM. Found to have elevated light chains. Celiac screen negative. Ferritin elevated in the 500s, iron sats normal. HFE genetic markers negative.    Referred to hematology for elevated free light chains, continues to be followed.   Abd u/s with elastography 09/2022: -fatty liver -median kPa of 7.3 with IQR/Median kPa ratio: 0.63   Colonoscopy 01/2022: Diverticulosis, internal hemorrhoids. Next colonoscopy 7 years.   EGD March 2018: LA grade a esophagitis, duodenal erosions, small hiatal hernia     Medications   Current Outpatient Medications  Medication Sig Dispense Refill   albuterol (VENTOLIN HFA) 108 (90 Base) MCG/ACT inhaler Inhale 1-2 puffs into the lungs every 6 (six) hours as needed for wheezing or shortness of breath. 18 g 0   ALPRAZolam (XANAX) 0.25 MG tablet Take 1 tablet (0.25 mg total) by mouth as needed for anxiety. 10 tablet 0   atorvastatin (LIPITOR) 40 MG tablet Take 1 tablet (40 mg total) by mouth daily. 90 tablet 3   budesonide-formoterol (SYMBICORT) 160-4.5 MCG/ACT inhaler Inhale 2 puffs into the lungs 2 (two) times daily. (Patient taking differently: Inhale 2 puffs into the lungs daily as needed (Shortness of breath).) 1 Inhaler 5   Cholecalciferol (VITAMIN D) 125 MCG (5000 UT) CAPS Take 5,000 Units by mouth daily with breakfast. 30 capsule 2   diclofenac Sodium (VOLTAREN) 1 % GEL Apply 2-4 grams to affected joint 4 times  daily as needed. 400 g 2   escitalopram (LEXAPRO) 20 MG tablet TAKE ONE TABLET BY MOUTH EVERY DAY 90 tablet 1   fluticasone (FLONASE) 50 MCG/ACT nasal spray Place 2 sprays into both nostrils daily. (Patient taking differently: Place 2 sprays into both nostrils daily as needed for allergies.) 48 g 5   glucose blood (ACCU-CHEK GUIDE) test strip CHECK BLOOD SUGAR ONCE  DAILY 100 strip 3   hydrochlorothiazide (HYDRODIURIL) 25 MG tablet TAKE 1 TABLET BY MOUTH ONCE DAILY AS NEEDED 90 tablet 1   hydrOXYzine (VISTARIL) 50 MG capsule Take 1 capsule (50 mg total) by mouth at bedtime as needed. 30 capsule 0   losartan (COZAAR) 50 MG tablet Take 1 tablet (50 mg total) by mouth daily. 90 tablet 1   metFORMIN (GLUCOPHAGE) 500 MG tablet Take 1 tablet (500 mg total) by mouth 2 (two) times daily after a meal. 180 tablet 1   Omega-3 Fatty Acids (FISH OIL OMEGA-3) 1000 MG CAPS Take 1 g by mouth daily. 90 capsule 1   omeprazole (PRILOSEC) 40 MG capsule Take 1 capsule (40 mg total) by mouth daily. 90 capsule 0   Probiotic Product (PROBIOTIC DAILY PO) Take by mouth.     tirzepatide (MOUNJARO) 5 MG/0.5ML Pen Inject 5 mg into the skin once a week. 2 mL 0   tiZANidine (ZANAFLEX) 4 MG tablet Take 1 tablet (4 mg total) by mouth every 6 (six) hours as needed for muscle spasms. 30 tablet 0   No current facility-administered medications for this visit.  Allergies   Allergies as of 10/02/2023 - Review Complete 09/20/2023  Allergen Reaction Noted   Sulfa antibiotics Hives, Diarrhea, and Itching 07/20/2011   Other  07/20/2011   Penicillins Hives, Itching, and Rash 03/15/2017     Past Medical History   Past Medical History:  Diagnosis Date   Allergic rhinitis    Anxiety    Chronic pain    Depression    Diabetes mellitus without complication (HCC)    GERD (gastroesophageal reflux disease)    Hyperlipidemia    Hypertension    IBS (irritable bowel syndrome)    PMS (premenstrual syndrome)    Reflux     Past  Surgical History   Past Surgical History:  Procedure Laterality Date   BREAST BIOPSY Right 12/07/2022   MM RT BREAST BX W LOC DEV 1ST LESION IMAGE BX SPEC STEREO GUIDE 12/07/2022 [facility]   COLONOSCOPY  08/29/2011   [name]: normal. high risk screening tcs in 5 years.    COLONOSCOPY WITH PROPOFOL N/A 10/20/2016   Procedure: COLONOSCOPY WITH PROPOFOL;  Surgeon: Corbin Ade, MD;  Location: [facility];  Service: Endoscopy;  Laterality: N/A;  10:45am   COLONOSCOPY WITH PROPOFOL N/A 02/09/2022   Procedure: COLONOSCOPY WITH PROPOFOL;  Surgeon: Corbin Ade, MD;  Location: [facility];  Service: Endoscopy;  Laterality: N/A;  2:30 / ASA 2   ESOPHAGOGASTRODUODENOSCOPY (EGD) WITH PROPOFOL N/A 10/20/2016   Procedure: ESOPHAGOGASTRODUODENOSCOPY (EGD) WITH PROPOFOL;  Surgeon: Corbin Ade, MD;  Location: [facility];  Service: Endoscopy;  Laterality: N/A;   LUMBAR DISC SURGERY     MALONEY DILATION N/A 10/20/2016   Procedure: Elease Hashimoto DILATION;  Surgeon: Corbin Ade, MD;  Location: [facility];  Service: Endoscopy;  Laterality: N/A;   POLYPECTOMY  10/20/2016   Procedure: POLYPECTOMY;  Surgeon: Corbin Ade, MD;  Location: [facility];  Service: Endoscopy;;  cecal and sigmoid polypectomies    Past Family History   Family History  Problem Relation Age of Onset   Hypertension Mother    Colon polyps Mother    Hypertension Father    Diabetes Father    Hypertension Sister    Hyperlipidemia Sister    Colon polyps Brother    Arthritis Brother    Diabetes Maternal Aunt    Hypertension Maternal Aunt    Rheum arthritis Maternal Grandmother    Prostate cancer Maternal Grandfather    Cancer Paternal Grandfather        not sure of type of cancer   Breast cancer Neg Hx    Cervical cancer Neg Hx     Past Social History   Social History   Socioeconomic History   Marital status: Divorced    Spouse name: Not on file   Number of children: Not on file   Years of education: Not on  file   Highest education level: Not on file  Occupational History   Not on file  Tobacco Use   Smoking status: Never    Passive exposure: Current   Smokeless tobacco: Never  Vaping Use   Vaping status: Never Used  Substance and Sexual Activity   Alcohol use: Yes    Comment: occ   Drug use: No   Sexual activity: Yes    Birth control/protection: None, Post-menopausal  Other Topics Concern   Not on file  Social History Narrative   Lives with her brother. Pt is on disability from her lumbar fusion.    Social Drivers of Health  Financial Resource Strain: Low Risk  (09/20/2023)   Overall Financial Resource Strain (CARDIA)    Difficulty of Paying Living Expenses: Not hard at all  Food Insecurity: No Food Insecurity (09/20/2023)   Hunger Vital Sign    Worried About Running Out of Food in the Last Year: Never true    Ran Out of Food in the Last Year: Never true  Transportation Needs: No Transportation Needs (09/20/2023)   PRAPARE - Administrator, Civil Service (Medical): No    Lack of Transportation (Non-Medical): No  Physical Activity: Inactive (09/20/2023)   Exercise Vital Sign    Days of Exercise per Week: 0 days    Minutes of Exercise per Session: 0 min  Stress: No Stress Concern Present (09/20/2023)   [employer] of Occupational Health - Occupational Stress Questionnaire    Feeling of Stress : Only a little  Social Connections: Moderately Isolated (09/20/2023)   Social Connection and Isolation Panel [NHANES]    Frequency of Communication with Friends and Family: More than three times a week    Frequency of Social Gatherings with Friends and Family: More than three times a week    Attends Religious Services: More than 4 times per year    Active Member of [employer] or Organizations: No    Attends Banker Meetings: Never    Marital Status: Divorced  Catering manager Violence: Not At Risk (09/20/2023)   Humiliation, Afraid, Rape, and Kick questionnaire     Fear of Current or Ex-Partner: No    Emotionally Abused: No    Physically Abused: No    Sexually Abused: No    Review of Systems   General: Negative for anorexia, weight loss, fever, chills, fatigue, weakness. ENT: Negative for hoarseness, difficulty swallowing , nasal congestion. CV: Negative for chest pain, angina, palpitations, dyspnea on exertion, peripheral edema.  Respiratory: Negative for dyspnea at rest, dyspnea on exertion, cough, sputum, wheezing.  GI: See history of present illness. GU:  Negative for dysuria, hematuria, urinary incontinence, urinary frequency, nocturnal urination.  Endo: Negative for unusual weight change.     Physical Exam   LMP 09/03/2013    General: Well-nourished, well-developed in no acute distress.  Eyes: No icterus. Mouth: Oropharyngeal mucosa moist and pink , no lesions erythema or exudate. Lungs: Clear to auscultation bilaterally.  Heart: Regular rate and rhythm, no murmurs rubs or gallops.  Abdomen: Bowel sounds are normal, nontender, nondistended, no hepatosplenomegaly or masses,  no abdominal bruits or hernia , no rebound or guarding.  Rectal: ***  Extremities: No lower extremity edema. No clubbing or deformities. Neuro: Alert and oriented x 4   Skin: Warm and dry, no jaundice.   Psych: Alert and cooperative, normal mood and affect.  Labs   Lab Results  Component Value Date   IRON 132 08/21/2023   TIBC 343 08/21/2023   FERRITIN 156 08/21/2023   Lab Results  Component Value Date   NA 139 08/21/2023   CL 101 08/21/2023   K 3.8 08/21/2023   CO2 28 08/21/2023   BUN 16 08/21/2023   CREATININE 0.87 08/21/2023   GFRNONAA >60 08/21/2023   CALCIUM 9.5 08/21/2023   ALBUMIN 4.2 08/21/2023   GLUCOSE 136 (H) 08/21/2023   Lab Results  Component Value Date   ALT 39 08/21/2023   AST 41 08/21/2023   ALKPHOS 59 08/21/2023   BILITOT 0.6 08/21/2023   Lab Results  Component Value Date   WBC 5.7 08/21/2023   HGB 13.0 08/21/2023  HCT 37.4 08/21/2023   MCV 91.9 08/21/2023   PLT 312 08/21/2023   Lab Results  Component Value Date   TSH 2.380 08/02/2023   Lab Results  Component Value Date   HGBA1C 5.8 (H) 08/02/2023    Imaging Studies   No results found.  Assessment       PLAN   ***   Leanna Battles. Melvyn Neth, MHS, PA-C [facility]

## 2023-10-02 ENCOUNTER — Ambulatory Visit: Payer: No Typology Code available for payment source

## 2023-10-02 ENCOUNTER — Ambulatory Visit: Payer: PPO | Admitting: Gastroenterology

## 2023-10-06 ENCOUNTER — Other Ambulatory Visit: Payer: Self-pay | Admitting: Family Medicine

## 2023-10-06 DIAGNOSIS — G479 Sleep disorder, unspecified: Secondary | ICD-10-CM

## 2023-10-06 MED ORDER — HYDROXYZINE PAMOATE 50 MG PO CAPS: 50.0000 mg | ORAL_CAPSULE | Freq: Every evening | ORAL | 0 refills | Status: DC | PRN

## 2023-10-24 ENCOUNTER — Other Ambulatory Visit: Payer: Self-pay | Admitting: Family Medicine

## 2023-10-24 DIAGNOSIS — I10 Essential (primary) hypertension: Secondary | ICD-10-CM | POA: Diagnosis not present

## 2023-10-24 DIAGNOSIS — I110 Hypertensive heart disease with heart failure: Secondary | ICD-10-CM | POA: Diagnosis not present

## 2023-10-24 DIAGNOSIS — F419 Anxiety disorder, unspecified: Secondary | ICD-10-CM | POA: Diagnosis not present

## 2023-10-24 DIAGNOSIS — E1165 Type 2 diabetes mellitus with hyperglycemia: Secondary | ICD-10-CM | POA: Diagnosis not present

## 2023-10-24 DIAGNOSIS — I509 Heart failure, unspecified: Secondary | ICD-10-CM | POA: Diagnosis not present

## 2023-10-24 DIAGNOSIS — F330 Major depressive disorder, recurrent, mild: Secondary | ICD-10-CM | POA: Diagnosis not present

## 2023-10-24 DIAGNOSIS — E559 Vitamin D deficiency, unspecified: Secondary | ICD-10-CM | POA: Diagnosis not present

## 2023-10-24 DIAGNOSIS — Z794 Long term (current) use of insulin: Secondary | ICD-10-CM | POA: Diagnosis not present

## 2023-10-24 DIAGNOSIS — E663 Overweight: Secondary | ICD-10-CM | POA: Diagnosis not present

## 2023-10-24 DIAGNOSIS — E1169 Type 2 diabetes mellitus with other specified complication: Secondary | ICD-10-CM | POA: Diagnosis not present

## 2023-10-24 DIAGNOSIS — J309 Allergic rhinitis, unspecified: Secondary | ICD-10-CM | POA: Diagnosis not present

## 2023-10-24 DIAGNOSIS — E785 Hyperlipidemia, unspecified: Secondary | ICD-10-CM | POA: Diagnosis not present

## 2023-10-26 ENCOUNTER — Other Ambulatory Visit: Payer: Self-pay | Admitting: Family Medicine

## 2023-10-26 ENCOUNTER — Telehealth: Payer: Self-pay | Admitting: Pharmacy Technician

## 2023-10-26 ENCOUNTER — Other Ambulatory Visit (HOSPITAL_COMMUNITY): Payer: Self-pay

## 2023-10-26 DIAGNOSIS — E1165 Type 2 diabetes mellitus with hyperglycemia: Secondary | ICD-10-CM

## 2023-10-26 MED ORDER — TIRZEPATIDE 7.5 MG/0.5ML ~~LOC~~ SOAJ: 7.5000 mg | SUBCUTANEOUS | 0 refills | Status: DC

## 2023-10-26 NOTE — Telephone Encounter (Signed)
 Pharmacy Patient Advocate Encounter  Received notification from [facility] [employer] that Prior Authorization for [facility] 7.5MG /0.5ML PEN has been APPROVED from 10/26/2023 to 10/25/2024. Ran test claim, Copay is $0.00. This test claim was processed through [facility]- copay amounts may vary at other pharmacies due to pharmacy/plan contracts, or as the patient moves through the different stages of their insurance plan.   PA #/Case ID/Reference #: [id number]

## 2023-10-26 NOTE — Telephone Encounter (Signed)
 Pharmacy Patient Advocate Encounter   Received notification from Patient Pharmacy that prior authorization for [facility] 7.5MG /0.5ML PEN is required/requested.   Insurance verification completed.   The patient is insured through [facility] ADVANTAGE/RX ADVANCE .   Per test claim: PA required; PA submitted to above mentioned insurance via CoverMyMeds Key/confirmation #/EOC [id number] Status is pending

## 2023-11-10 ENCOUNTER — Other Ambulatory Visit: Payer: Self-pay | Admitting: Family Medicine

## 2023-11-10 DIAGNOSIS — K219 Gastro-esophageal reflux disease without esophagitis: Secondary | ICD-10-CM

## 2023-11-10 DIAGNOSIS — G479 Sleep disorder, unspecified: Secondary | ICD-10-CM

## 2023-11-10 DIAGNOSIS — F410 Panic disorder [episodic paroxysmal anxiety] without agoraphobia: Secondary | ICD-10-CM

## 2023-11-10 DIAGNOSIS — M62838 Other muscle spasm: Secondary | ICD-10-CM

## 2023-11-13 MED ORDER — OMEPRAZOLE 40 MG PO CPDR: 40.0000 mg | DELAYED_RELEASE_CAPSULE | Freq: Every day | ORAL | 0 refills | Status: DC

## 2023-11-13 MED ORDER — HYDROXYZINE PAMOATE 50 MG PO CAPS: 50.0000 mg | ORAL_CAPSULE | Freq: Every evening | ORAL | 0 refills | Status: DC | PRN

## 2023-11-14 ENCOUNTER — Ambulatory Visit
Admission: RE | Admit: 2023-11-14 | Discharge: 2023-11-14 | Disposition: A | Discharge status: Home / Self Care | Payer: PPO | Source: Ambulatory Visit | Attending: Internal Medicine | Admitting: Internal Medicine

## 2023-11-14 DIAGNOSIS — H52203 Unspecified astigmatism, bilateral: Secondary | ICD-10-CM | POA: Diagnosis not present

## 2023-11-14 DIAGNOSIS — Z1231 Encounter for screening mammogram for malignant neoplasm of breast: Secondary | ICD-10-CM | POA: Diagnosis not present

## 2023-11-14 DIAGNOSIS — H2513 Age-related nuclear cataract, bilateral: Secondary | ICD-10-CM | POA: Diagnosis not present

## 2023-11-14 DIAGNOSIS — E119 Type 2 diabetes mellitus without complications: Secondary | ICD-10-CM | POA: Diagnosis not present

## 2023-11-14 DIAGNOSIS — Z0000 Encounter for general adult medical examination without abnormal findings: Secondary | ICD-10-CM

## 2023-11-17 ENCOUNTER — Other Ambulatory Visit: Payer: Self-pay

## 2023-11-17 DIAGNOSIS — E782 Mixed hyperlipidemia: Secondary | ICD-10-CM | POA: Diagnosis not present

## 2023-11-17 DIAGNOSIS — E1165 Type 2 diabetes mellitus with hyperglycemia: Secondary | ICD-10-CM

## 2023-11-18 ENCOUNTER — Other Ambulatory Visit: Payer: Self-pay | Admitting: Family Medicine

## 2023-11-18 DIAGNOSIS — M62838 Other muscle spasm: Secondary | ICD-10-CM

## 2023-11-18 DIAGNOSIS — G479 Sleep disorder, unspecified: Secondary | ICD-10-CM

## 2023-11-18 DIAGNOSIS — K219 Gastro-esophageal reflux disease without esophagitis: Secondary | ICD-10-CM

## 2023-11-18 LAB — COMPREHENSIVE METABOLIC PANEL WITH GFR
ALT: 36 IU/L — ABNORMAL HIGH (ref 0–32)
AST: 35 IU/L (ref 0–40)
Albumin: 4.5 g/dL (ref 3.9–4.9)
Alkaline Phosphatase: 85 IU/L (ref 44–121)
BUN/Creatinine Ratio: 8 — ABNORMAL LOW (ref 12–28)
BUN: 8 mg/dL (ref 8–27)
Bilirubin Total: 0.6 mg/dL (ref 0.0–1.2)
CO2: 24 mmol/L (ref 20–29)
Calcium: 10.3 mg/dL (ref 8.7–10.3)
Chloride: 102 mmol/L (ref 96–106)
Creatinine, Ser: 0.99 mg/dL (ref 0.57–1.00)
Globulin, Total: 2.9 g/dL (ref 1.5–4.5)
Glucose: 121 mg/dL — ABNORMAL HIGH (ref 70–99)
Potassium: 4.2 mmol/L (ref 3.5–5.2)
Sodium: 141 mmol/L (ref 134–144)
Total Protein: 7.4 g/dL (ref 6.0–8.5)
eGFR: 64 mL/min/{1.73_m2} (ref >59)

## 2023-11-18 LAB — LIPID PANEL
Chol/HDL Ratio: 1.8 ratio (ref 0.0–4.4)
Cholesterol, Total: 114 mg/dL (ref 100–199)
HDL: 62 mg/dL (ref >39)
LDL Chol Calc (NIH): 37 mg/dL (ref 0–99)
Triglycerides: 75 mg/dL (ref 0–149)
VLDL Cholesterol Cal: 15 mg/dL (ref 5–40)

## 2023-11-18 MED ORDER — HYDROXYZINE PAMOATE 50 MG PO CAPS: 50.0000 mg | ORAL_CAPSULE | Freq: Every evening | ORAL | 0 refills | Status: DC | PRN

## 2023-11-18 MED ORDER — OMEPRAZOLE 40 MG PO CPDR: 40.0000 mg | DELAYED_RELEASE_CAPSULE | Freq: Every day | ORAL | 0 refills | Status: DC

## 2023-11-18 MED ORDER — TIZANIDINE HCL 4 MG PO TABS: 4.0000 mg | ORAL_TABLET | Freq: Four times a day (QID) | ORAL | 0 refills | Status: AC | PRN

## 2023-11-21 ENCOUNTER — Ambulatory Visit (INDEPENDENT_AMBULATORY_CARE_PROVIDER_SITE_OTHER): Payer: No Typology Code available for payment source | Admitting: "Endocrinology

## 2023-11-21 ENCOUNTER — Encounter: Payer: Self-pay | Admitting: "Endocrinology

## 2023-11-21 VITALS — BP 120/74 | HR 64 | Ht 65.5 in | Wt 171.4 lb

## 2023-11-21 DIAGNOSIS — Z7984 Long term (current) use of oral hypoglycemic drugs: Secondary | ICD-10-CM

## 2023-11-21 DIAGNOSIS — E782 Mixed hyperlipidemia: Secondary | ICD-10-CM | POA: Diagnosis not present

## 2023-11-21 DIAGNOSIS — I10 Essential (primary) hypertension: Secondary | ICD-10-CM

## 2023-11-21 DIAGNOSIS — E1165 Type 2 diabetes mellitus with hyperglycemia: Secondary | ICD-10-CM | POA: Diagnosis not present

## 2023-11-21 LAB — POCT GLYCOSYLATED HEMOGLOBIN (HGB A1C)

## 2023-11-21 NOTE — Patient Instructions (Signed)

## 2023-11-21 NOTE — Progress Notes (Signed)
 11/21/2023, 5:34 PM    Endocrinology follow-up note    Subjective:    Patient ID: Laura Scott, female    DOB: 1961-02-03.  Laura Scott is being seen in follow-up after she was seen in consultation for  management of currently controlled symptomatic diabetes requested by  Laura Laroche, FNP.   Past Medical History:  Diagnosis Date   Allergic rhinitis    Anxiety    Chronic pain    Depression    Diabetes mellitus without complication (HCC)    GERD (gastroesophageal reflux disease)    Hyperlipidemia    Hypertension    IBS (irritable bowel syndrome)    PMS (premenstrual syndrome)    Reflux     Past Surgical History:  Procedure Laterality Date   BREAST BIOPSY Right 12/07/2022   MM RT BREAST BX W LOC DEV 1ST LESION IMAGE BX SPEC STEREO GUIDE 12/07/2022 [facility]   COLONOSCOPY  08/29/2011   [name]: normal. high risk screening tcs in 5 years.    COLONOSCOPY WITH PROPOFOL N/A 10/20/2016   Procedure: COLONOSCOPY WITH PROPOFOL;  Surgeon: Corbin Ade, MD;  [facility];  Service: Endoscopy;  Laterality: N/A;  10:45am   COLONOSCOPY WITH PROPOFOL N/A 02/09/2022   Procedure: COLONOSCOPY WITH PROPOFOL;  Surgeon: Corbin Ade, MD;  [facility];  Service: Endoscopy;  Laterality: N/A;  2:30 / ASA 2   ESOPHAGOGASTRODUODENOSCOPY (EGD) WITH PROPOFOL N/A 10/20/2016   Procedure: ESOPHAGOGASTRODUODENOSCOPY (EGD) WITH PROPOFOL;  Surgeon: Corbin Ade, MD;  [facility];  Service: Endoscopy;  Laterality: N/A;   LUMBAR DISC SURGERY     MALONEY DILATION N/A 10/20/2016   Procedure: Elease Hashimoto DILATION;  Surgeon: Corbin Ade, MD;  [facility];  Service: Endoscopy;  Laterality: N/A;   POLYPECTOMY  10/20/2016   Procedure: POLYPECTOMY;  Surgeon: Corbin Ade, MD;  [facility];  Service: Endoscopy;;  cecal and sigmoid polypectomies    Social History    Socioeconomic History   Marital status: Divorced    Spouse name: Not on file   Number of children: Not on file   Years of education: Not on file   Highest education level: Not on file  Occupational History   Not on file  Tobacco Use   Smoking status: Never    Passive exposure: Current   Smokeless tobacco: Never  Vaping Use   Vaping status: Never Used  Substance and Sexual Activity   Alcohol use: Yes    Comment: occ   Drug use: No   Sexual activity: Yes    Birth control/protection: None, Post-menopausal  Other Topics Concern   Not on file  Social History Narrative   Lives with her brother. Pt is on disability from her lumbar fusion.    Social Drivers of Corporate investment banker Strain: Low Risk  (09/20/2023)   Overall Financial Resource Strain (CARDIA)    Difficulty of Paying Living Expenses: Not hard at all  Food Insecurity: No Food Insecurity (09/20/2023)   Hunger Vital Sign    Worried About Running Out of Food in the Last  Year: Never true    Ran Out of Food in the Last Year: Never true  Transportation Needs: No Transportation Needs (09/20/2023)   PRAPARE - Administrator, Civil Service (Medical): No    Lack of Transportation (Non-Medical): No  Physical Activity: Inactive (09/20/2023)   Exercise Vital Sign    Days of Exercise per Week: 0 days    Minutes of Exercise per Session: 0 min  Stress: No Stress Concern Present (09/20/2023)   [employer] of Occupational Health - Occupational Stress Questionnaire    Feeling of Stress : Only a little  Social Connections: Moderately Isolated (09/20/2023)   Social Connection and Isolation Panel [NHANES]    Frequency of Communication with Friends and Family: More than three times a week    Frequency of Social Gatherings with Friends and Family: More than three times a week    Attends Religious Services: More than 4 times per year    Active Member of [employer] or Organizations: No    Attends Banker  Meetings: Never    Marital Status: Divorced    Family History  Problem Relation Age of Onset   Hypertension Mother    Colon polyps Mother    Hypertension Father    Diabetes Father    Hypertension Sister    Hyperlipidemia Sister    Colon polyps Brother    Arthritis Brother    Diabetes Maternal Aunt    Hypertension Maternal Aunt    Rheum arthritis Maternal Grandmother    Prostate cancer Maternal Grandfather    Cancer Paternal Grandfather        not sure of type of cancer   Breast cancer Neg Hx    Cervical cancer Neg Hx     Outpatient Encounter Medications as of 11/21/2023  Medication Sig   albuterol (VENTOLIN HFA) 108 (90 Base) MCG/ACT inhaler Inhale 1-2 puffs into the lungs every 6 (six) hours as needed for wheezing or shortness of breath.   ALPRAZolam (XANAX) 0.25 MG tablet Take 1 tablet (0.25 mg total) by mouth as needed for anxiety.   atorvastatin (LIPITOR) 40 MG tablet Take 1 tablet (40 mg total) by mouth daily.   budesonide-formoterol (SYMBICORT) 160-4.5 MCG/ACT inhaler Inhale 2 puffs into the lungs 2 (two) times daily. (Patient taking differently: Inhale 2 puffs into the lungs daily as needed (Shortness of breath).)   Cholecalciferol (VITAMIN D) 125 MCG (5000 UT) CAPS Take 5,000 Units by mouth daily with breakfast.   diclofenac Sodium (VOLTAREN) 1 % GEL Apply 2-4 grams to affected joint 4 times daily as needed.   escitalopram (LEXAPRO) 20 MG tablet TAKE ONE TABLET BY MOUTH EVERY DAY   fluticasone (FLONASE) 50 MCG/ACT nasal spray Place 2 sprays into both nostrils daily. (Patient taking differently: Place 2 sprays into both nostrils daily as needed for allergies.)   glucose blood (ACCU-CHEK GUIDE) test strip CHECK BLOOD SUGAR ONCE  DAILY   hydrochlorothiazide (HYDRODIURIL) 25 MG tablet TAKE 1 TABLET BY MOUTH ONCE DAILY AS NEEDED   hydrOXYzine (VISTARIL) 50 MG capsule Take 1 capsule (50 mg total) by mouth at bedtime as needed.   losartan (COZAAR) 50 MG tablet Take 1 tablet (50 mg  total) by mouth daily.   metFORMIN (GLUCOPHAGE) 500 MG tablet Take 1 tablet (500 mg total) by mouth 2 (two) times daily after a meal. (Patient taking differently: Take 500 mg by mouth daily with breakfast.)   Omega-3 Fatty Acids (FISH OIL OMEGA-3) 1000 MG CAPS Take 1 g by mouth  daily.   omeprazole (PRILOSEC) 40 MG capsule Take 1 capsule (40 mg total) by mouth daily.   Probiotic Product (PROBIOTIC DAILY PO) Take by mouth.   tirzepatide (MOUNJARO) 7.5 MG/0.5ML Pen Inject 7.5 mg into the skin once a week.   tiZANidine (ZANAFLEX) 4 MG tablet Take 1 tablet (4 mg total) by mouth every 6 (six) hours as needed for muscle spasms.   No facility-administered encounter medications on file as of 11/21/2023.    ALLERGIES: Allergies  Allergen Reactions   Sulfa Antibiotics Hives, Diarrhea and Itching   Other     SURGICAL TAPE Reaction: rash    Penicillins Hives, Itching and Rash    Pt states that she is able to take PCN  Has patient had a PCN reaction causing immediate rash, facial/tongue/throat swelling, SOB or lightheadedness with hypotension: Yes Has patient had a PCN reaction causing severe rash involving mucus membranes or skin necrosis: No Has patient had a PCN reaction that required hospitalization: No Has patient had a PCN reaction occurring within the last 10 years: Yes If all of the above answers are "NO", then may proceed with Cephalosporin use.    VACCINATION STATUS: Immunization History  Administered Date(s) Administered   Influenza, Seasonal, Injecte, Preservative Fre 07/31/2023   Influenza,inj,Quad PF,6+ Mos 05/31/2018, 08/07/2021, 11/12/2021   Influenza-Unspecified 05/23/2011   Moderna Covid-19 Vaccine Bivalent Booster 76yrs & up 09/07/2021   Moderna Sars-Covid-2 Vaccination 11/02/2019, 12/04/2019, 08/06/2020   Pneumococcal Polysaccharide-23 08/20/2018    Diabetes She presents for her follow-up diabetic visit. She has type 2 diabetes mellitus. Onset time: She was diagnosed at  approximate age of 35 years. Her disease course has been stable. There are no hypoglycemic associated symptoms. Pertinent negatives for hypoglycemia include no confusion, headaches, pallor or seizures. There are no diabetic associated symptoms. Pertinent negatives for diabetes include no chest pain, no polydipsia, no polyphagia and no polyuria. There are no hypoglycemic complications. Symptoms are stable. There are no diabetic complications. Risk factors for coronary artery disease include diabetes mellitus, dyslipidemia, family history, hypertension, post-menopausal and sedentary lifestyle. Current diabetic treatment includes oral agent (monotherapy). Her weight is fluctuating minimally. She is following a generally unhealthy diet. When asked about meal planning, she reported none. She has not had a previous visit with a dietitian. She rarely participates in exercise. Her home blood glucose trend is fluctuating minimally. (She presents with glycemic profile to target.  Her point-of-care A1c is 5.6%, overall improving from 8.5%.  She did not document hypoglycemia.  She does not have consistent access for GLP-1 receptor agonist, remains on metformin 500 mg p.o. once a day.    ) An ACE inhibitor/angiotensin II receptor blocker is being taken. She does not see a podiatrist.Eye exam is current.  Hyperlipidemia This is a chronic problem. The current episode started more than 1 year ago. The problem is uncontrolled. Recent lipid tests were reviewed and are variable. Exacerbating diseases include diabetes. Factors aggravating her hyperlipidemia include fatty foods. Pertinent negatives include no chest pain, myalgias or shortness of breath. Current antihyperlipidemic treatment includes statins. Risk factors for coronary artery disease include dyslipidemia, diabetes mellitus, hypertension, a sedentary lifestyle and post-menopausal.  Hypertension This is a chronic problem. The current episode started more than 1 year  ago. The problem has been resolved since onset. The problem is controlled. Pertinent negatives include no chest pain, headaches, palpitations or shortness of breath. Risk factors for coronary artery disease include dyslipidemia, diabetes mellitus, family history, sedentary lifestyle and post-menopausal state. Past treatments include  diuretics and angiotensin blockers. The current treatment provides moderate improvement. There are no compliance problems.    Review of systems  Constitutional: + Minimally fluctuating body weight,  current Body mass index is 28.09 kg/m. , no fatigue, no subjective hyperthermia, no subjective hypothermia  Respiratory: no cough, no shortness of breath Gastrointestinal: no nausea/vomiting, + diarrhea   Objective:    BP 120/74   Pulse 64   Ht 5' 5.5" (1.664 m)   Wt 171 lb 6.4 oz (77.7 kg)   LMP 09/03/2013   BMI 28.09 kg/m   Wt Readings from Last 3 Encounters:  11/21/23 171 lb 6.4 oz (77.7 kg)  09/20/23 167 lb (75.8 kg)  09/07/23 167 lb 5.3 oz (75.9 kg)     BP Readings from Last 3 Encounters:  11/21/23 120/74  09/07/23 (!) 152/75  07/31/23 128/72     Physical Exam- Limited  Constitutional:  Body mass index is 28.09 kg/m. , not in acute distress, normal state of mind  CMP     Component Value Date/Time   NA 141 11/17/2023 1551   K 4.2 11/17/2023 1551   CL 102 11/17/2023 1551   CO2 24 11/17/2023 1551   GLUCOSE 121 (H) 11/17/2023 1551   GLUCOSE 136 (H) 08/21/2023 0953   BUN 8 11/17/2023 1551   CREATININE 0.99 11/17/2023 1551   CREATININE 1.03 12/23/2019 1036   CALCIUM 10.3 11/17/2023 1551   PROT 7.4 11/17/2023 1551   ALBUMIN 4.5 11/17/2023 1551   AST 35 11/17/2023 1551   ALT 36 (H) 11/17/2023 1551   ALKPHOS 85 11/17/2023 1551   BILITOT 0.6 11/17/2023 1551   GFRNONAA >60 08/21/2023 0953   GFRNONAA 60 12/23/2019 1036   GFRAA 69 12/23/2019 1036     Diabetic Labs (most recent): Lab Results  Component Value Date   HGBA1C 5.8 (H)  08/02/2023   HGBA1C 5.2 05/23/2023   HGBA1C 8.5 (H) 12/01/2022   MICROALBUR 80 08/31/2020   MICROALBUR 2.8 12/23/2019     Lipid Panel ( most recent) Lipid Panel     Component Value Date/Time   CHOL 114 11/17/2023 1551   TRIG 75 11/17/2023 1551   HDL 62 11/17/2023 1551   CHOLHDL 1.8 11/17/2023 1551   CHOLHDL 3.9 09/10/2014 0805   VLDL 25 09/10/2014 0805   LDLCALC 37 11/17/2023 1551   LABVLDL 15 11/17/2023 1551     Assessment & Plan:   1) Type 2 diabetes mellitus with hyperglycemia, without long-term current use of insulin (HCC)  - Lollie Marrow Sinko has currently uncontrolled symptomatic type 2 DM since  63 years of age.  She presents with glycemic profile to target.  Her point-of-care A1c is 5.6%, overall improving from 8.5%.  She did not document hypoglycemia.  She used to rely on patient assistant program for access, however currently she does not have consistent access for GLP-1 receptor agonist, remains on metformin 500 mg p.o. once a day.   Recent labs reviewed.  - I had a long discussion with her about the progressive nature of diabetes and the pathology behind its complications. -She does not report any gross complications from her diabetes, however, she remains at a high risk for more acute and chronic complications which include CAD, CVA, CKD, retinopathy, and neuropathy. These are all discussed in detail with her.  - Nutritional counseling repeated at each appointment due to patients tendency to fall back in to old habits.  - she acknowledges that there is a room for improvement in her food and  drink choices. - Suggestion is made for her to avoid simple carbohydrates  from her diet including Cakes, Sweet Desserts, Ice Cream, Soda (diet and regular), Sweet Tea, Candies, Chips, Cookies, Store Bought Juices, Alcohol , Artificial Sweeteners,  Coffee Creamer, and "Sugar-free" Products, Lemonade. This will help patient to have more stable blood glucose profile and potentially  avoid unintended weight gain.  The following Lifestyle Medicine recommendations according to American College of Lifestyle Medicine  [facility]) were discussed and and offered to patient and she  agrees to start the journey:  A. Whole Foods, Plant-Based Nutrition comprising of fruits and vegetables, plant-based proteins, whole-grain carbohydrates was discussed in detail with the patient.   A list for source of those nutrients were also provided to the patient.  Patient will use only water or unsweetened tea for hydration. B.  The need to stay away from risky substances including alcohol, smoking; obtaining 7 to 9 hours of restorative sleep, at least 150 minutes of moderate intensity exercise weekly, the importance of healthy social connections,  and stress management techniques were discussed. C.  A full color page of  Calorie density of various food groups per pound showing examples of each food groups was provided to the patient.   - she has been scheduled with Norm Salt, RDN, CDE for diabetes education.  - I have approached her with the following individualized plan to manage  her diabetes and patient agrees:   -In light of her presentation controlled glycemic profile, she will not need additional intervention at this time.  She is advised to continue metformin 500 mg p.o. daily at breakfast.  Since she does not have appropriate access for GLP-1 receptor agonist, it would not be prescribed for her.  She is advised to use it when she gets access to it.    She  is advised to monitor blood glucose at least 2 times daily-daily before breakfast and at bedtime.    - Specific targets for  A1c;  LDL, HDL,  and Triglycerides were discussed with the patient.  2) Blood Pressure /Hypertension:  -Her blood pressure is controlled to target.  she is advised to continue her current meds including HCTZ 25 mg p.o. daily with breakfast .  She is also on Losartan 50 mg p.o. daily.  3) Lipids/Hyperlipidemia:     Review of her previsit lipid panel showed significant improvement in her lipid panel with LDL at 37, improving from 161.     She is advised to continue her atorvastatin 20 mg p.o. nightly.    She will also benefit from whole food plant-based diet discussed above. She does have elevated liver enzymes which could be a sign of fatty liver.  This is discussed with her the potential risk for liver failure.  If her ferritin remains high, she will need a consult with hematology.   4)  Weight/Diet:   Her Body mass index is 28.09 kg/m.  -   she is not a candidate for major weight loss.     I discussed with her the fact that loss of 5 - 10% of her  current body weight will have the most impact on her diabetes management.  Exercise, and detailed carbohydrates information provided  -  detailed on discharge instructions.  5) Chronic Care/Health Maintenance: -she  is on ARB and statin medications and  is encouraged to initiate and continue to follow up with Ophthalmology, Dentist,  Podiatrist at least yearly or according to recommendations, and advised to stay away  from smoking. I have recommended yearly flu vaccine and pneumonia vaccine at least every 5 years; moderate intensity exercise for up to 150 minutes weekly; and  sleep for at least 7 hours a day.   - she is  advised to maintain close follow up with Laura Laroche, FNP for primary care needs, as well as her other providers for optimal and coordinated care.  I spent  26  minutes in the care of the patient today including review of labs from CMP, Lipids, Thyroid Function, Hematology (current and previous including abstractions from other facilities); face-to-face time discussing  her blood glucose readings/logs, discussing hypoglycemia and hyperglycemia episodes and symptoms, medications doses, her options of short and long term treatment based on the latest standards of care / guidelines;  discussion about incorporating lifestyle medicine;  and  documenting the encounter. Risk reduction counseling performed per USPSTF guidelines to reduce  obesity and cardiovascular risk factors.     Please refer to Patient Instructions for Blood Glucose Monitoring and Insulin/Medications Dosing Guide"  in media tab for additional information. Please  also refer to " Patient Self Inventory" in the Media  tab for reviewed elements of pertinent patient history.  Laura Scott participated in the discussions, expressed understanding, and voiced agreement with the above plans.  All questions were answered to her satisfaction. she is encouraged to contact clinic should she have any questions or concerns prior to her return visit.   Follow up plan: - Return in about 6 months (around 05/22/2024) for A1c -[place].  Ronny Bacon, [facility], [facility] [address] [place], [place] [address] Phone: [phone] Fax: [phone]  11/21/2023, 5:34 PM

## 2023-11-27 ENCOUNTER — Other Ambulatory Visit: Payer: Self-pay | Admitting: Family Medicine

## 2023-11-27 DIAGNOSIS — E1165 Type 2 diabetes mellitus with hyperglycemia: Secondary | ICD-10-CM

## 2023-11-30 ENCOUNTER — Other Ambulatory Visit: Payer: Self-pay | Admitting: Family Medicine

## 2023-11-30 DIAGNOSIS — E1165 Type 2 diabetes mellitus with hyperglycemia: Secondary | ICD-10-CM

## 2023-11-30 MED ORDER — TIRZEPATIDE 10 MG/0.5ML ~~LOC~~ SOAJ: 10.0000 mg | SUBCUTANEOUS | 0 refills | Status: DC

## 2023-12-28 ENCOUNTER — Other Ambulatory Visit: Payer: Self-pay | Admitting: Family Medicine

## 2023-12-28 DIAGNOSIS — G479 Sleep disorder, unspecified: Secondary | ICD-10-CM

## 2023-12-28 MED ORDER — HYDROXYZINE PAMOATE 50 MG PO CAPS: 50.0000 mg | ORAL_CAPSULE | Freq: Every evening | ORAL | 0 refills | Status: DC | PRN

## 2024-02-01 ENCOUNTER — Other Ambulatory Visit: Payer: Self-pay | Admitting: Family Medicine

## 2024-02-01 DIAGNOSIS — E1165 Type 2 diabetes mellitus with hyperglycemia: Secondary | ICD-10-CM

## 2024-02-05 ENCOUNTER — Telehealth: Payer: Self-pay | Admitting: Family Medicine

## 2024-02-05 NOTE — Telephone Encounter (Signed)
 Media section shows [facility], faxed for records

## 2024-02-05 NOTE — Telephone Encounter (Signed)
 Patient goes to a eye dr in [place], she will call back when she finds the name of it.

## 2024-02-21 ENCOUNTER — Other Ambulatory Visit: Payer: Self-pay | Admitting: Family Medicine

## 2024-02-21 DIAGNOSIS — E1165 Type 2 diabetes mellitus with hyperglycemia: Secondary | ICD-10-CM

## 2024-02-22 ENCOUNTER — Other Ambulatory Visit: Payer: Self-pay | Admitting: Family Medicine

## 2024-02-22 DIAGNOSIS — E1165 Type 2 diabetes mellitus with hyperglycemia: Secondary | ICD-10-CM

## 2024-02-22 MED ORDER — TIRZEPATIDE 12.5 MG/0.5ML ~~LOC~~ SOAJ: 12.5000 mg | SUBCUTANEOUS | 0 refills | Status: DC

## 2024-03-21 ENCOUNTER — Other Ambulatory Visit: Payer: Self-pay | Admitting: Family Medicine

## 2024-03-21 DIAGNOSIS — G479 Sleep disorder, unspecified: Secondary | ICD-10-CM

## 2024-03-23 ENCOUNTER — Other Ambulatory Visit: Payer: Self-pay | Admitting: Family Medicine

## 2024-03-23 DIAGNOSIS — F419 Anxiety disorder, unspecified: Secondary | ICD-10-CM

## 2024-03-23 DIAGNOSIS — I10 Essential (primary) hypertension: Secondary | ICD-10-CM

## 2024-04-08 ENCOUNTER — Telehealth: Payer: Self-pay | Admitting: Orthopaedic Surgery

## 2024-04-08 NOTE — Telephone Encounter (Signed)
 Spoke w/this pt, she is requesting an appointment w/you for LBP that is running down her leg and tingling.  She stated she had a lumbar fusion about 81yrs ago in [place].  I told her she probably needs to go back to her surgeon, but she wants to see you.  She saw you for her knee in 2019.  Please advise.  [phone]

## 2024-04-11 ENCOUNTER — Other Ambulatory Visit: Payer: Self-pay | Admitting: Family Medicine

## 2024-04-11 ENCOUNTER — Telehealth: Payer: Self-pay

## 2024-04-11 DIAGNOSIS — E1165 Type 2 diabetes mellitus with hyperglycemia: Secondary | ICD-10-CM

## 2024-04-11 MED ORDER — TIRZEPATIDE 12.5 MG/0.5ML ~~LOC~~ SOAJ: 12.5000 mg | SUBCUTANEOUS | 0 refills | Status: DC

## 2024-04-11 NOTE — Telephone Encounter (Signed)
 Copied from CRM [id number]. Topic: Clinical - Prescription Issue >> Apr 11, 2024  1:39 PM Laura Scott wrote: Reason for CRM: pt stated that the pharamacy told her tirzepatide  supposed to be 12 mg and not 10 is that the reason it got denied? Please follow back up with pt about this.

## 2024-04-12 ENCOUNTER — Encounter: Payer: Self-pay | Admitting: Radiology

## 2024-04-17 ENCOUNTER — Other Ambulatory Visit (INDEPENDENT_AMBULATORY_CARE_PROVIDER_SITE_OTHER)

## 2024-04-17 ENCOUNTER — Telehealth: Payer: Self-pay | Admitting: Orthopaedic Surgery

## 2024-04-17 ENCOUNTER — Ambulatory Visit: Admitting: Orthopaedic Surgery

## 2024-04-17 ENCOUNTER — Encounter: Payer: Self-pay | Admitting: Orthopaedic Surgery

## 2024-04-17 VITALS — BP 151/78 | HR 60 | Ht 65.0 in | Wt 170.0 lb

## 2024-04-17 DIAGNOSIS — M545 Low back pain, unspecified: Secondary | ICD-10-CM

## 2024-04-17 DIAGNOSIS — G894 Chronic pain syndrome: Secondary | ICD-10-CM

## 2024-04-17 MED ORDER — DIAZEPAM 10 MG PO TABS: 10.0000 mg | ORAL_TABLET | Freq: Once | ORAL | 0 refills | Status: AC

## 2024-04-17 NOTE — Progress Notes (Signed)
 Subjective:    Patient ID: Laura Scott, female    DOB: 01-Jun-1961, 63 y.o.   MRN: [id number]  HPI She has a long history of lower back pain.  She is post surgery and placement of plate and screws at L4-L5 plus artificial disc, post ablation, post epidurals in the past.  About two months ago she started having new lower back pain with radiation to the knee on the right and to the mid calf on the right.  She has no trauma.  She is taking Zanaflex  and has just begun on Neurontin  that helps.  She has no weakness.  She is concerned that a new nerve may be pinched.  She was seen recently at her family doctor's office.  She is a diabetic and takes metformin  and has GERD and cannot take NSAIDs.   Review of Systems  Constitutional:  Positive for activity change.  Musculoskeletal:  Positive for arthralgias, back pain and myalgias.  All other systems reviewed and are negative. For Review of Systems, all other systems reviewed and are negative.  The following is a summary of the past history medically, past history surgically, known current medicines, social history and family history.  This information is gathered electronically by the computer from prior information and documentation.  I review this each visit and have found including this information at this point in the chart is beneficial and informative.   Past Medical History:  Diagnosis Date   Allergic rhinitis    Anxiety    Chronic pain    Depression    Diabetes mellitus without complication (HCC)    GERD (gastroesophageal reflux disease)    Hyperlipidemia    Hypertension    IBS (irritable bowel syndrome)    PMS (premenstrual syndrome)    Reflux     Past Surgical History:  Procedure Laterality Date   BREAST BIOPSY Right 12/07/2022   MM RT BREAST BX W LOC DEV 1ST LESION IMAGE BX SPEC STEREO GUIDE 12/07/2022 [facility]   COLONOSCOPY  08/29/2011   [name]: normal. high risk screening tcs in 5 years.    COLONOSCOPY WITH  PROPOFOL  N/A 10/20/2016   Procedure: COLONOSCOPY WITH PROPOFOL ;  Surgeon: Lamar CHRISTELLA Hollingshead, MD;  Location: [facility];  Service: Endoscopy;  Laterality: N/A;  10:45am   COLONOSCOPY WITH PROPOFOL  N/A 02/09/2022   Procedure: COLONOSCOPY WITH PROPOFOL ;  Surgeon: Hollingshead Lamar CHRISTELLA, MD;  Location: [facility];  Service: Endoscopy;  Laterality: N/A;  2:30 / ASA 2   ESOPHAGOGASTRODUODENOSCOPY (EGD) WITH PROPOFOL  N/A 10/20/2016   Procedure: ESOPHAGOGASTRODUODENOSCOPY (EGD) WITH PROPOFOL ;  Surgeon: Lamar CHRISTELLA Hollingshead, MD;  Location: [facility];  Service: Endoscopy;  Laterality: N/A;   LUMBAR DISC SURGERY     MALONEY DILATION N/A 10/20/2016   Procedure: AGAPITO DILATION;  Surgeon: Lamar CHRISTELLA Hollingshead, MD;  Location: [facility];  Service: Endoscopy;  Laterality: N/A;   POLYPECTOMY  10/20/2016   Procedure: POLYPECTOMY;  Surgeon: Lamar CHRISTELLA Hollingshead, MD;  Location: [facility];  Service: Endoscopy;;  cecal and sigmoid polypectomies    Current Outpatient Medications on File Prior to Visit  Medication Sig Dispense Refill   albuterol  (VENTOLIN  HFA) 108 (90 Base) MCG/ACT inhaler Inhale 1-2 puffs into the lungs every 6 (six) hours as needed for wheezing or shortness of breath. 18 g 0   ALPRAZolam  (XANAX ) 0.25 MG tablet Take 1 tablet (0.25 mg total) by mouth as needed for anxiety. 10 tablet 0   atorvastatin  (LIPITOR) 40 MG tablet Take  1 tablet (40 mg total) by mouth daily. 90 tablet 3   budesonide -formoterol  (SYMBICORT ) 160-4.5 MCG/ACT inhaler Inhale 2 puffs into the lungs 2 (two) times daily. (Patient taking differently: Inhale 2 puffs into the lungs daily as needed (Shortness of breath).) 1 Inhaler 5   Cholecalciferol (VITAMIN D ) 125 MCG (5000 UT) CAPS Take 5,000 Units by mouth daily with breakfast. 30 capsule 2   diclofenac  Sodium (VOLTAREN ) 1 % GEL Apply 2-4 grams to affected joint 4 times daily as needed. 400 g 2   escitalopram  (LEXAPRO ) 20 MG tablet TAKE ONE TABLET BY MOUTH EVERY DAY 90 tablet 1   fluticasone  (FLONASE )  50 MCG/ACT nasal spray Place 2 sprays into both nostrils daily. (Patient taking differently: Place 2 sprays into both nostrils daily as needed for allergies.) 48 g 5   glucose blood (ACCU-CHEK GUIDE) test strip CHECK BLOOD SUGAR ONCE  DAILY 100 strip 3   hydrochlorothiazide  (HYDRODIURIL ) 25 MG tablet TAKE 1 TABLET BY MOUTH ONCE DAILY AS NEEDED 90 tablet 1   hydrOXYzine  (VISTARIL ) 50 MG capsule Take 1 capsule (50 mg total) by mouth at bedtime as needed. 30 capsule 0   losartan  (COZAAR ) 50 MG tablet Take 1 tablet (50 mg total) by mouth daily. 90 tablet 1   metFORMIN  (GLUCOPHAGE ) 500 MG tablet Take 1 tablet (500 mg total) by mouth 2 (two) times daily after a meal. (Patient taking differently: Take 500 mg by mouth daily with breakfast.) 180 tablet 1   Omega-3 Fatty Acids (FISH OIL  OMEGA-3) 1000 MG CAPS Take 1 g by mouth daily. 90 capsule 1   omeprazole  (PRILOSEC) 40 MG capsule Take 1 capsule (40 mg total) by mouth daily. 90 capsule 0   Probiotic Product (PROBIOTIC DAILY PO) Take by mouth.     tirzepatide  (MOUNJARO ) 12.5 MG/0.5ML Pen Inject 12.5 mg into the skin once a week. 2 mL 0   tiZANidine  (ZANAFLEX ) 4 MG tablet Take 1 tablet (4 mg total) by mouth every 6 (six) hours as needed for muscle spasms. 30 tablet 0   No current facility-administered medications on file prior to visit.    Social History   Socioeconomic History   Marital status: Divorced    Spouse name: Not on file   Number of children: Not on file   Years of education: Not on file   Highest education level: Not on file  Occupational History   Not on file  Tobacco Use   Smoking status: Never    Passive exposure: Current   Smokeless tobacco: Never  Vaping Use   Vaping status: Never Used  Substance and Sexual Activity   Alcohol use: Yes    Comment: occ   Drug use: No   Sexual activity: Yes    Birth control/protection: None, Post-menopausal  Other Topics Concern   Not on file  Social History Narrative   Lives with her  brother. Pt is on disability from her lumbar fusion.    Social Drivers of Corporate investment banker Strain: Low Risk  (09/20/2023)   Overall Financial Resource Strain (CARDIA)    Difficulty of Paying Living Expenses: Not hard at all  Food Insecurity: No Food Insecurity (09/20/2023)   Hunger Vital Sign    Worried About Running Out of Food in the Last Year: Never true    Ran Out of Food in the Last Year: Never true  Transportation Needs: No Transportation Needs (09/20/2023)   PRAPARE - Administrator, Civil Service (Medical): No  Lack of Transportation (Non-Medical): No  Physical Activity: Inactive (09/20/2023)   Exercise Vital Sign    Days of Exercise per Week: 0 days    Minutes of Exercise per Session: 0 min  Stress: No Stress Concern Present (09/20/2023)   [employer] of Occupational Health - Occupational Stress Questionnaire    Feeling of Stress : Only a little  Social Connections: Moderately Isolated (09/20/2023)   Social Connection and Isolation Panel    Frequency of Communication with Friends and Family: More than three times a week    Frequency of Social Gatherings with Friends and Family: More than three times a week    Attends Religious Services: More than 4 times per year    Active Member of [employer] or Organizations: No    Attends Banker Meetings: Never    Marital Status: Divorced  Catering manager Violence: Not At Risk (09/20/2023)   Humiliation, Afraid, Rape, and Kick questionnaire    Fear of Current or Ex-Partner: No    Emotionally Abused: No    Physically Abused: No    Sexually Abused: No    Family History  Problem Relation Age of Onset   Hypertension Mother    Colon polyps Mother    Hypertension Father    Diabetes Father    Hypertension Sister    Hyperlipidemia Sister    Colon polyps Brother    Arthritis Brother    Diabetes Maternal Aunt    Hypertension Maternal Aunt    Rheum arthritis Maternal Grandmother    Prostate cancer  Maternal Grandfather    Cancer Paternal Grandfather        not sure of type of cancer   Breast cancer Neg Hx    Cervical cancer Neg Hx     BP (!) 151/78   Pulse 60   Ht 5' 5 (1.651 m)   Wt 170 lb (77.1 kg)   LMP 09/03/2013   BMI 28.29 kg/m   Body mass index is 28.29 kg/m.      Objective:   Physical Exam Vitals and nursing note reviewed. Exam conducted with a chaperone present.  Constitutional:      Appearance: She is well-developed.  HENT:     Head: Normocephalic and atraumatic.  Eyes:     Conjunctiva/sclera: Conjunctivae normal.     Pupils: Pupils are equal, round, and reactive to light.  Cardiovascular:     Rate and Rhythm: Normal rate and regular rhythm.  Pulmonary:     Effort: Pulmonary effort is normal.  Abdominal:     Palpations: Abdomen is soft.  Musculoskeletal:       Arms:     Cervical back: Normal range of motion and neck supple.  Skin:    General: Skin is warm and dry.  Neurological:     Mental Status: She is alert and oriented to person, place, and time.     Cranial Nerves: No cranial nerve deficit.     Motor: No abnormal muscle tone.     Coordination: Coordination normal.     Deep Tendon Reflexes: Reflexes are normal and symmetric. Reflexes normal.  Psychiatric:        Behavior: Behavior normal.        Thought Content: Thought content normal.        Judgment: Judgment normal.   X-rays were done of the lumbar spine, reported separately.        Assessment & Plan:   Encounter Diagnoses  Name Primary?   Lumbar pain Yes  Chronic pain syndrome    I will get MRI of the lumbar spine with and without contrast.  I will give Valium  10 prior to the procedure.  Return in two weeks.  Continue the neurontin .  Call if any problem.  Precautions discussed.  [id number]

## 2024-04-17 NOTE — Telephone Encounter (Signed)
 Order has been corrected and pt aware

## 2024-04-17 NOTE — Telephone Encounter (Signed)
 Dr. Janae pt - spoke w/the pt, she was seen this morning, she stated that when she called to schedule her MRI, they told her it's not filled out correctly so she can't schedule.  Please check on this for her and give her a call [phone].

## 2024-04-17 NOTE — Patient Instructions (Signed)
 MRI has been ordered to [facility] radiology, they will call to schedule the appt. Please call [phone] to schedule if you have not heard from them in 2 days.   Follow up in 2 weeks after the MRI with Dr. Brenna

## 2024-04-19 ENCOUNTER — Ambulatory Visit (HOSPITAL_COMMUNITY)
Admission: RE | Admit: 2024-04-19 | Discharge: 2024-04-19 | Disposition: A | Discharge status: Home / Self Care | Source: Ambulatory Visit | Attending: Orthopaedic Surgery | Admitting: Orthopaedic Surgery

## 2024-04-19 DIAGNOSIS — M5137 Other intervertebral disc degeneration, lumbosacral region with discogenic back pain only: Secondary | ICD-10-CM

## 2024-04-19 DIAGNOSIS — M47816 Spondylosis without myelopathy or radiculopathy, lumbar region: Secondary | ICD-10-CM

## 2024-04-19 DIAGNOSIS — Z981 Arthrodesis status: Secondary | ICD-10-CM

## 2024-04-19 DIAGNOSIS — M47817 Spondylosis without myelopathy or radiculopathy, lumbosacral region: Secondary | ICD-10-CM

## 2024-04-19 DIAGNOSIS — M545 Low back pain, unspecified: Secondary | ICD-10-CM | POA: Insufficient documentation

## 2024-04-19 MED ORDER — GADOBUTROL 1 MMOL/ML IV SOLN
7.5000 mL | Freq: Once | INTRAVENOUS | Status: AC | PRN
  Administered 2024-04-19: 7.5 mL via INTRAVENOUS

## 2024-05-13 ENCOUNTER — Other Ambulatory Visit: Payer: Self-pay | Admitting: Family Medicine

## 2024-05-13 DIAGNOSIS — K219 Gastro-esophageal reflux disease without esophagitis: Secondary | ICD-10-CM

## 2024-05-13 DIAGNOSIS — G479 Sleep disorder, unspecified: Secondary | ICD-10-CM

## 2024-05-15 ENCOUNTER — Other Ambulatory Visit: Payer: Self-pay | Admitting: Family Medicine

## 2024-05-15 ENCOUNTER — Encounter: Payer: Self-pay | Admitting: Orthopaedic Surgery

## 2024-05-15 ENCOUNTER — Ambulatory Visit: Admitting: Orthopaedic Surgery

## 2024-05-15 DIAGNOSIS — M545 Low back pain, unspecified: Secondary | ICD-10-CM

## 2024-05-15 DIAGNOSIS — E1165 Type 2 diabetes mellitus with hyperglycemia: Secondary | ICD-10-CM

## 2024-05-15 NOTE — Progress Notes (Signed)
 My back is hurting.  She had MRI of the lumbar spine showing: IMPRESSION: 1. Previous anterior and posterior fusion at L4-5 2. Moderate facet arthropathy at L3-4 with mild spinal stenosis 3. Severe degenerative disc disease at L5-S1 with facet arthropathy. There is severe bilateral neural foraminal stenosis. No significant spinal stenosis 4. No significant change compared with Jan 09, 2019  I have explained the findings to her.  I have recommended consideration for epidural.  She agrees and I will get appointment to see Dr. Eldonna.  She has good ROM today, some pain more on the right, NV intact, muscle tone and strength normal.  Encounter Diagnosis  Name Primary?   Lumbar pain Yes   To see Dr. Eldonna.  I have informed the patient I will be retiring from medical practice and from this office on May 23, 2024.  The patient has been offered continuing care with Dr. Margrette or Dr. Onesimo of this office.  The patient may choose another provider and the records will be forwarded after proper signature and notification.  Patient understands and agrees.  She will follow up with Dr. Onesimo in six weeks.  Call if any problem.  Precautions discussed.  Take ibuprofen  as needed.  Electronically Signed Lemond Stable, MD 9/24/202510:01 AM

## 2024-05-21 ENCOUNTER — Encounter: Payer: Self-pay | Admitting: "Endocrinology

## 2024-05-21 ENCOUNTER — Ambulatory Visit (INDEPENDENT_AMBULATORY_CARE_PROVIDER_SITE_OTHER): Admitting: "Endocrinology

## 2024-05-21 ENCOUNTER — Telehealth: Payer: Self-pay

## 2024-05-21 VITALS — BP 124/64 | HR 64 | Ht 65.0 in | Wt 158.6 lb

## 2024-05-21 DIAGNOSIS — E782 Mixed hyperlipidemia: Secondary | ICD-10-CM | POA: Diagnosis not present

## 2024-05-21 DIAGNOSIS — R748 Abnormal levels of other serum enzymes: Secondary | ICD-10-CM

## 2024-05-21 DIAGNOSIS — Z7985 Long-term (current) use of injectable non-insulin antidiabetic drugs: Secondary | ICD-10-CM

## 2024-05-21 DIAGNOSIS — E1165 Type 2 diabetes mellitus with hyperglycemia: Secondary | ICD-10-CM

## 2024-05-21 DIAGNOSIS — I10 Essential (primary) hypertension: Secondary | ICD-10-CM | POA: Diagnosis not present

## 2024-05-21 LAB — POCT GLYCOSYLATED HEMOGLOBIN (HGB A1C): HbA1c, POC (controlled diabetic range): 5.2 % (ref 0.0–7.0)

## 2024-05-21 MED ORDER — TIRZEPATIDE 7.5 MG/0.5ML ~~LOC~~ SOAJ: 7.5000 mg | SUBCUTANEOUS | 1 refills | Status: DC

## 2024-05-21 NOTE — Patient Instructions (Signed)

## 2024-05-21 NOTE — Progress Notes (Signed)
 05/21/2024, 2:14 PM    Endocrinology follow-up note    Subjective:    Patient ID: Laura Scott, female    DOB: 1960/12/21.  Bartlett LITTIE Blush is being seen in follow-up after she was seen in consultation for  management of currently controlled symptomatic diabetes requested by  Edman Meade PEDLAR, FNP.   Past Medical History:  Diagnosis Date   Allergic rhinitis    Anxiety    Chronic pain    Depression    Diabetes mellitus without complication (HCC)    GERD (gastroesophageal reflux disease)    Hyperlipidemia    Hypertension    IBS (irritable bowel syndrome)    PMS (premenstrual syndrome)    Reflux     Past Surgical History:  Procedure Laterality Date   BREAST BIOPSY Right 12/07/2022   MM RT BREAST BX W LOC DEV 1ST LESION IMAGE BX SPEC STEREO GUIDE 12/07/2022 [facility]   COLONOSCOPY  08/29/2011   [name]: normal. high risk screening tcs in 5 years.    COLONOSCOPY WITH PROPOFOL  N/A 10/20/2016   Procedure: COLONOSCOPY WITH PROPOFOL ;  Surgeon: Lamar CHRISTELLA Hollingshead, MD;  [facility];  Service: Endoscopy;  Laterality: N/A;  10:45am   COLONOSCOPY WITH PROPOFOL  N/A 02/09/2022   Procedure: COLONOSCOPY WITH PROPOFOL ;  Surgeon: Hollingshead Lamar CHRISTELLA, MD;  [facility]: [facility];  Service: Endoscopy;  Laterality: N/A;  2:30 / ASA 2   ESOPHAGOGASTRODUODENOSCOPY (EGD) WITH PROPOFOL  N/A 10/20/2016   Procedure: ESOPHAGOGASTRODUODENOSCOPY (EGD) WITH PROPOFOL ;  Surgeon: Lamar CHRISTELLA Hollingshead, MD;  [facility];  Service: Endoscopy;  Laterality: N/A;   LUMBAR DISC SURGERY     MALONEY DILATION N/A 10/20/2016   Procedure: AGAPITO DILATION;  Surgeon: Lamar CHRISTELLA Hollingshead, MD;  [facility];  Service: Endoscopy;  Laterality: N/A;   POLYPECTOMY  10/20/2016   Procedure: POLYPECTOMY;  Surgeon: Lamar CHRISTELLA Hollingshead, MD;  [facility];  Service: Endoscopy;;  cecal and sigmoid polypectomies    Social  History   Socioeconomic History   Marital status: Divorced    Spouse name: Not on file   Number of children: Not on file   Years of education: Not on file   Highest education level: Not on file  Occupational History   Not on file  Tobacco Use   Smoking status: Never    Passive exposure: Current   Smokeless tobacco: Never  Vaping Use   Vaping status: Never Used  Substance and Sexual Activity   Alcohol use: Yes    Comment: occ   Drug use: No   Sexual activity: Yes    Birth control/protection: None, Post-menopausal  Other Topics Concern   Not on file  Social History Narrative   Lives with her brother. Pt is on disability from her lumbar fusion.    Social Drivers of Corporate investment banker Strain: Low Risk  (09/20/2023)   Overall Financial Resource Strain (CARDIA)    Difficulty of Paying Living Expenses: Not hard at all  Food Insecurity: No Food Insecurity (09/20/2023)   Hunger Vital Sign    Worried About Running Out of Food in the  Last Year: Never true    Ran Out of Food in the Last Year: Never true  Transportation Needs: No Transportation Needs (09/20/2023)   PRAPARE - Administrator, Civil Service (Medical): No    Lack of Transportation (Non-Medical): No  Physical Activity: Inactive (09/20/2023)   Exercise Vital Sign    Days of Exercise per Week: 0 days    Minutes of Exercise per Session: 0 min  Stress: No Stress Concern Present (09/20/2023)   [employer] of Occupational Health - Occupational Stress Questionnaire    Feeling of Stress : Only a little  Social Connections: Moderately Isolated (09/20/2023)   Social Connection and Isolation Panel    Frequency of Communication with Friends and Family: More than three times a week    Frequency of Social Gatherings with Friends and Family: More than three times a week    Attends Religious Services: More than 4 times per year    Active Member of [employer] or Organizations: No    Attends Banker  Meetings: Never    Marital Status: Divorced    Family History  Problem Relation Age of Onset   Hypertension Mother    Colon polyps Mother    Hypertension Father    Diabetes Father    Hypertension Sister    Hyperlipidemia Sister    Colon polyps Brother    Arthritis Brother    Diabetes Maternal Aunt    Hypertension Maternal Aunt    Rheum arthritis Maternal Grandmother    Prostate cancer Maternal Grandfather    Cancer Paternal Grandfather        not sure of type of cancer   Breast cancer Neg Hx    Cervical cancer Neg Hx     Outpatient Encounter Medications as of 05/21/2024  Medication Sig   GABAPENTIN , ONCE-DAILY, PO Take 1 tablet by mouth daily.   tirzepatide  (MOUNJARO ) 7.5 MG/0.5ML Pen Inject 7.5 mg into the skin once a week.   albuterol  (VENTOLIN  HFA) 108 (90 Base) MCG/ACT inhaler Inhale 1-2 puffs into the lungs every 6 (six) hours as needed for wheezing or shortness of breath.   ALPRAZolam  (XANAX ) 0.25 MG tablet Take 1 tablet (0.25 mg total) by mouth as needed for anxiety.   atorvastatin  (LIPITOR) 40 MG tablet Take 1 tablet (40 mg total) by mouth daily.   budesonide -formoterol  (SYMBICORT ) 160-4.5 MCG/ACT inhaler Inhale 2 puffs into the lungs 2 (two) times daily. (Patient taking differently: Inhale 2 puffs into the lungs daily as needed (Shortness of breath).)   Cholecalciferol (VITAMIN D ) 125 MCG (5000 UT) CAPS Take 5,000 Units by mouth daily with breakfast.   diclofenac  Sodium (VOLTAREN ) 1 % GEL Apply 2-4 grams to affected joint 4 times daily as needed.   escitalopram  (LEXAPRO ) 20 MG tablet TAKE ONE TABLET BY MOUTH EVERY DAY   fluticasone  (FLONASE ) 50 MCG/ACT nasal spray Place 2 sprays into both nostrils daily. (Patient taking differently: Place 2 sprays into both nostrils daily as needed for allergies.)   glucose blood (ACCU-CHEK GUIDE) test strip CHECK BLOOD SUGAR ONCE  DAILY   hydrochlorothiazide  (HYDRODIURIL ) 25 MG tablet TAKE 1 TABLET BY MOUTH ONCE DAILY AS NEEDED    hydrOXYzine  (VISTARIL ) 50 MG capsule Take 1 capsule (50 mg total) by mouth at bedtime as needed.   losartan  (COZAAR ) 50 MG tablet Take 1 tablet (50 mg total) by mouth daily.   metFORMIN  (GLUCOPHAGE ) 500 MG tablet Take 1 tablet (500 mg total) by mouth 2 (two) times daily after a meal. (  Patient taking differently: Take 500 mg by mouth daily with breakfast.)   Omega-3 Fatty Acids (FISH OIL  OMEGA-3) 1000 MG CAPS Take 1 g by mouth daily.   omeprazole  (PRILOSEC) 40 MG capsule TAKE ONE CAPSULE BY MOUTH EVERY DAY   Probiotic Product (PROBIOTIC DAILY PO) Take by mouth.   tiZANidine  (ZANAFLEX ) 4 MG tablet Take 1 tablet (4 mg total) by mouth every 6 (six) hours as needed for muscle spasms.   [DISCONTINUED] tirzepatide  (MOUNJARO ) 12.5 MG/0.5ML Pen Inject 12.5 mg into the skin once a week.   No facility-administered encounter medications on file as of 05/21/2024.    ALLERGIES: Allergies  Allergen Reactions   Sulfa Antibiotics Hives, Diarrhea and Itching   Other     SURGICAL TAPE Reaction: rash    Penicillins Hives, Itching and Rash    Pt states that she is able to take PCN  Has patient had a PCN reaction causing immediate rash, facial/tongue/throat swelling, SOB or lightheadedness with hypotension: Yes Has patient had a PCN reaction causing severe rash involving mucus membranes or skin necrosis: No Has patient had a PCN reaction that required hospitalization: No Has patient had a PCN reaction occurring within the last 10 years: Yes If all of the above answers are NO, then may proceed with Cephalosporin use.    VACCINATION STATUS: Immunization History  Administered Date(s) Administered   Influenza, Seasonal, Injecte, Preservative Fre 07/31/2023   Influenza,inj,Quad PF,6+ Mos 05/31/2018, 08/07/2021, 11/12/2021   Influenza-Unspecified 05/23/2011   Moderna Covid-19 Vaccine Bivalent Booster 95yrs & up 09/07/2021   Moderna Sars-Covid-2 Vaccination 11/02/2019, 12/04/2019, 08/06/2020   Pneumococcal  Polysaccharide-23 08/20/2018    Diabetes She presents for her follow-up diabetic visit. She has type 2 diabetes mellitus. Onset time: She was diagnosed at approximate age of 35 years. Her disease course has been improving. There are no hypoglycemic associated symptoms. Pertinent negatives for hypoglycemia include no confusion, headaches, pallor or seizures. There are no diabetic associated symptoms. Pertinent negatives for diabetes include no chest pain, no polydipsia, no polyphagia and no polyuria. There are no hypoglycemic complications. Symptoms are improving. There are no diabetic complications. Risk factors for coronary artery disease include diabetes mellitus, dyslipidemia, family history, hypertension, post-menopausal and sedentary lifestyle. Current diabetic treatment includes oral agent (monotherapy). Her weight is decreasing steadily. She is following a generally unhealthy diet. When asked about meal planning, she reported none. She has not had a previous visit with a dietitian. She rarely participates in exercise. Her home blood glucose trend is fluctuating minimally. (She presents with controlled glycemic profile evident from her point-of-care A1c of 5.2%, improving from 8.5%.  She remains on GLP-1 receptor agonist and metformin .  Her last exposure to Mounjaro  was 2 weeks ago 12.5 mg    ) An ACE inhibitor/angiotensin II receptor blocker is being taken. She does not see a podiatrist.Eye exam is current.  Hyperlipidemia This is a chronic problem. The current episode started more than 1 year ago. The problem is uncontrolled. Recent lipid tests were reviewed and are variable. Exacerbating diseases include diabetes. Factors aggravating her hyperlipidemia include fatty foods. Pertinent negatives include no chest pain, myalgias or shortness of breath. Current antihyperlipidemic treatment includes statins. Risk factors for coronary artery disease include dyslipidemia, diabetes mellitus, hypertension, a  sedentary lifestyle and post-menopausal.  Hypertension This is a chronic problem. The current episode started more than 1 year ago. The problem has been resolved since onset. The problem is controlled. Pertinent negatives include no chest pain, headaches, palpitations or shortness of breath.  Risk factors for coronary artery disease include dyslipidemia, diabetes mellitus, family history, sedentary lifestyle and post-menopausal state. Past treatments include diuretics and angiotensin blockers. The current treatment provides moderate improvement. There are no compliance problems.    Review of systems  Constitutional: + Minimally fluctuating body weight,  current Body mass index is 26.39 kg/m. , no fatigue, no subjective hyperthermia, no subjective hypothermia  Respiratory: no cough, no shortness of breath Gastrointestinal: no nausea/vomiting, + diarrhea   Objective:    BP 124/64   Pulse 64   Ht 5' 5 (1.651 m)   Wt 158 lb 9.6 oz (71.9 kg)   LMP 09/03/2013   BMI 26.39 kg/m   Wt Readings from Last 3 Encounters:  05/21/24 158 lb 9.6 oz (71.9 kg)  04/17/24 170 lb (77.1 kg)  11/21/23 171 lb 6.4 oz (77.7 kg)     BP Readings from Last 3 Encounters:  05/21/24 124/64  04/17/24 (!) 151/78  11/21/23 120/74     Physical Exam- Limited  Constitutional:  Body mass index is 26.39 kg/m. , not in acute distress, normal state of mind  CMP     Component Value Date/Time   NA 141 11/17/2023 1551   K 4.2 11/17/2023 1551   CL 102 11/17/2023 1551   CO2 24 11/17/2023 1551   GLUCOSE 121 (H) 11/17/2023 1551   GLUCOSE 136 (H) 08/21/2023 0953   BUN 8 11/17/2023 1551   CREATININE 0.99 11/17/2023 1551   CREATININE 1.03 12/23/2019 1036   CALCIUM  10.3 11/17/2023 1551   PROT 7.4 11/17/2023 1551   ALBUMIN 4.5 11/17/2023 1551   AST 35 11/17/2023 1551   ALT 36 (H) 11/17/2023 1551   ALKPHOS 85 11/17/2023 1551   BILITOT 0.6 11/17/2023 1551   GFRNONAA >60 08/21/2023 0953   GFRNONAA 60 12/23/2019  1036   GFRAA 69 12/23/2019 1036     Diabetic Labs (most recent): Lab Results  Component Value Date   HGBA1C 5.2 05/21/2024   HGBA1C 5.8 (H) 08/02/2023   HGBA1C 5.2 05/23/2023   MICROALBUR 80 08/31/2020   MICROALBUR 2.8 12/23/2019     Lipid Panel ( most recent) Lipid Panel     Component Value Date/Time   CHOL 114 11/17/2023 1551   TRIG 75 11/17/2023 1551   HDL 62 11/17/2023 1551   CHOLHDL 1.8 11/17/2023 1551   CHOLHDL 3.9 09/10/2014 0805   VLDL 25 09/10/2014 0805   LDLCALC 37 11/17/2023 1551   LABVLDL 15 11/17/2023 1551     Assessment & Plan:   1) Type 2 diabetes mellitus with hyperglycemia, without long-term current use of insulin (HCC)  - Bartlett CROME Wooldridge has currently uncontrolled symptomatic type 2 DM since  63 years of age.  She presents with controlled glycemic profile evident from her point-of-care A1c of 5.2%, improving from 8.5%.  She remains on GLP-1 receptor agonist and metformin .  Her last exposure to Mounjaro  was 2 weeks ago 12.5 mg. . - I had a long discussion with her about the progressive nature of diabetes and the pathology behind its complications. -She does not report any gross complications from her diabetes, however, she remains at a high risk for more acute and chronic complications which include CAD, CVA, CKD, retinopathy, and neuropathy. These are all discussed in detail with her.  - Nutritional counseling repeated at each appointment due to patients tendency to fall back in to old habits.  - she acknowledges that there is a room for improvement in her food and drink choices. - Suggestion  is made for her to avoid simple carbohydrates  from her diet including Cakes, Sweet Desserts, Ice Cream, Soda (diet and regular), Sweet Tea, Candies, Chips, Cookies, Store Bought Juices, Alcohol , Artificial Sweeteners,  Coffee Creamer, and Sugar-free Products, Lemonade. This will help patient to have more stable blood glucose profile and potentially avoid  unintended weight gain.  The following Lifestyle Medicine recommendations according to American College of Lifestyle Medicine  [facility]) were discussed and and offered to patient and she  agrees to start the journey:  A. Whole Foods, Plant-Based Nutrition comprising of fruits and vegetables, plant-based proteins, whole-grain carbohydrates was discussed in detail with the patient.   A list for source of those nutrients were also provided to the patient.  Patient will use only water  or unsweetened tea for hydration. B.  The need to stay away from risky substances including alcohol, smoking; obtaining 7 to 9 hours of restorative sleep, at least 150 minutes of moderate intensity exercise weekly, the importance of healthy social connections,  and stress management techniques were discussed. C.  A full color page of  Calorie density of various food groups per pound showing examples of each food groups was provided to the patient.   - she has been scheduled with Penny Crumpton, RDN, CDE for diabetes education.  - I have approached her with the following individualized plan to manage  her diabetes and patient agrees:   -In light of her presentation controlled glycemic profile, she will not need additional intervention at this time.  She wishes to stay on GLP-1 receptor agonists.  Due to a lapse in her exposure, she will be resumed on her lower dose of Mounjaro  7.5 mg subcutaneously weekly.  Side effects and precautions discussed with her.  This medication will be advised as necessary and appropriate on subsequent visits.    She will still benefit from low-dose metformin , 500 mg p.o. daily at breakfast.    - Specific targets for  A1c;  LDL, HDL,  and Triglycerides were discussed with the patient.  2) Blood Pressure /Hypertension:  - Her blood pressure is controlled to target.  she is advised to continue her current meds including HCTZ 25 mg p.o. daily with breakfast  and  losartan  50 mg p.o. daily at  breakfast.    3) Lipids/Hyperlipidemia:    Review of her previsit lipid panel showed significant improvement in her lipid panel with LDL at 37, improving from 884.   She is advised to continue atorvastatin  20 mg nightly.    She will also benefit from whole food plant-based diet discussed above. She does have elevated liver enzymes which could be a sign of fatty liver.  This is discussed with her the potential risk for liver failure.  If her ferritin remains high, she will need a consult with hematology.   4)  Weight/Diet:   Her Body mass index is 26.39 kg/m.  -   she has achieved enough weight loss, not a candidate for major weight loss.    I discussed with her the fact that loss of 5 - 10% of her  current body weight will have the most impact on her diabetes management.  Exercise, and detailed carbohydrates information provided  -  detailed on discharge instructions.  5) Chronic Care/Health Maintenance: -she  is on ARB and statin medications and  is encouraged to initiate and continue to follow up with Ophthalmology, Dentist,  Podiatrist at least yearly or according to recommendations, and advised to stay away from  smoking. I have recommended yearly flu vaccine and pneumonia vaccine at least every 5 years; moderate intensity exercise for up to 150 minutes weekly; and  sleep for at least 7 hours a day.  Diabetic foot exam was normal on May 21, 2024.  - she is  advised to maintain close follow up with Bac[place], Meade PEDLAR, FNP for primary care needs, as well as her other providers for optimal and coordinated care.  I spent  26  minutes in the care of the patient today including review of labs from CMP, Lipids, Thyroid  Function, Hematology (current and previous including abstractions from other facilities); face-to-face time discussing  her blood glucose readings/logs, discussing hypoglycemia and hyperglycemia episodes and symptoms, medications doses, her options of short and long term  treatment based on the latest standards of care / guidelines;  discussion about incorporating lifestyle medicine;  and documenting the encounter. Risk reduction counseling performed per USPSTF guidelines to reduce cardiovascular risk factors.     Please refer to Patient Instructions for Blood Glucose Monitoring and Insulin/Medications Dosing Guide  in media tab for additional information. Please  also refer to  Patient Self Inventory in the Media  tab for reviewed elements of pertinent patient history.  Bartlett LITTIE Blush participated in the discussions, expressed understanding, and voiced agreement with the above plans.  All questions were answered to her satisfaction. she is encouraged to contact clinic should she have any questions or concerns prior to her return visit.  Follow up plan: - Return in about 4 months (around 09/20/2024) for Fasting Labs  in AM B4 8, A1c -NV.  Benton Rio, [facility] [address] [place], [place] [address] Phone: [phone] Fax: [phone]  05/21/2024, 2:14 PM

## 2024-05-21 NOTE — Telephone Encounter (Signed)
 Patient come by to do a follow up on request for tirzepatide  (MOUNJARO ) 12.5 MG/0.5ML Pen

## 2024-05-22 ENCOUNTER — Other Ambulatory Visit: Payer: Self-pay | Admitting: Family Medicine

## 2024-05-29 NOTE — Progress Notes (Signed)
 ONETA SIGMAN                                          MRN: [id number]   05/29/2024   The [facility] Quality Team Specialist reviewed this patient medical record for the purposes of chart review for care gap closure. The following were reviewed: abstraction for care gap closure-glycemic status assessment.    VBCI Quality Team

## 2024-06-19 ENCOUNTER — Telehealth: Payer: Self-pay | Admitting: Physical Medicine and Rehabilitation

## 2024-06-19 NOTE — Telephone Encounter (Signed)
Patient called. She would like an appointment with Dr. Newton.  

## 2024-06-24 ENCOUNTER — Encounter: Payer: Self-pay | Admitting: Radiology

## 2024-06-25 ENCOUNTER — Other Ambulatory Visit: Payer: Self-pay | Admitting: Family Medicine

## 2024-06-25 ENCOUNTER — Ambulatory Visit: Payer: Self-pay

## 2024-06-25 DIAGNOSIS — G479 Sleep disorder, unspecified: Secondary | ICD-10-CM

## 2024-06-25 NOTE — Telephone Encounter (Signed)
Noted patient scheduled

## 2024-06-25 NOTE — Telephone Encounter (Signed)
 FYI Only or Action Required?: FYI only for provider: appointment scheduled on 06/26/2024.  Patient was last seen in primary care on 07/31/2023 by Edman Meade PEDLAR, FNP.  Called Nurse Triage reporting Vaginal Itching.  Symptoms began about a month ago.  Interventions attempted: Nothing.  Symptoms are: gradually worsening.  Triage Disposition: See Physician Within 24 Hours  Patient/caregiver understands and will follow disposition?: Yes         Copied from CRM [id number]. Topic: Clinical - Red Word Triage >> Jun 25, 2024  4:00 PM Fonda T wrote: [facility] that prompted transfer to Nurse Triage: Patient states she is having increased vaginal itching.  Thinks she may have an infection.   Patient is requesting an appointment as soon as possible for evaluation. Reason for Disposition  MODERATE-SEVERE itching (i.e., interferes with school, work, or sleep)  Answer Assessment - Initial Assessment Questions 1. SYMPTOM: What's the main symptom you're concerned about? (e.g., pain, itching, dryness)     Itching  2. LOCATION: Where is the  itching located? (e.g., inside/outside, left/right)     Both sides of vaginal area  3. ONSET: When did the  itching  start?     1 month ago, symptoms come and go  4. PAIN: Is there any pain? If Yes, ask: How bad is it? (Scale: 1-10; mild, moderate, severe)     Denies  5. ITCHING: Is there any itching? If Yes, ask: How bad is it? (Scale: 1-10; mild, moderate, severe)     Moderate  6. CAUSE: What do you think is causing the discharge? Have you had the same problem before? What happened then?     Denies vaginal discharge, possible infection  7. OTHER SYMPTOMS: Do you have any other symptoms? (e.g., fever, itching, vaginal bleeding, pain with urination, injury to genital area, vaginal foreign body)     Denies  Protocols used: Vaginal Symptoms-A-AH

## 2024-06-26 ENCOUNTER — Encounter: Payer: Self-pay | Admitting: Family Medicine

## 2024-06-26 ENCOUNTER — Ambulatory Visit (INDEPENDENT_AMBULATORY_CARE_PROVIDER_SITE_OTHER): Payer: Self-pay | Admitting: Family Medicine

## 2024-06-26 VITALS — BP 124/72 | HR 80 | Ht 65.0 in | Wt 163.0 lb

## 2024-06-26 DIAGNOSIS — N952 Postmenopausal atrophic vaginitis: Secondary | ICD-10-CM

## 2024-06-26 MED ORDER — ESTRADIOL 0.01 % VA CREA: 1.0000 | TOPICAL_CREAM | Freq: Every day | VAGINAL | 12 refills | Status: AC

## 2024-06-27 DIAGNOSIS — N952 Postmenopausal atrophic vaginitis: Secondary | ICD-10-CM | POA: Insufficient documentation

## 2024-06-27 NOTE — Progress Notes (Signed)
 Subjective:  Patient ID: Laura Scott, female    DOB: November 10, 1960  Age: 63 y.o. MRN: [id number]  CC:   Chief Complaint  Patient presents with   Vaginal Itching    Vaginal itching     HPI:  63 year old female presents for evaluation of the above.  Patient reports ongoing vaginal itching for the past month. No discharge. She has not been sexually active in quite some time. No relieving factors. No other complaints at this time.  Patient Active Problem List   Diagnosis Date Noted   Atrophic vaginitis 06/27/2024   Long-term (current) use of injectable non-insulin antidiabetic drugs 02/20/2023   Elevated ferritin 01/27/2023   GERD (gastroesophageal reflux disease) 11/29/2022   Elevated liver enzymes 08/19/2022   Overweight (BMI 25.0-29.9) 04/26/2022   Annual physical exam 11/13/2021   Positive ANA (antinuclear antibody) 09/14/2021   Arthralgia of right acromioclavicular joint 09/14/2021   Allergies 09/14/2021   Vitamin D  deficiency 09/14/2021   Abdominal pain, epigastric 07/30/2021   Abnormal LFTs 07/30/2021   Mixed hyperlipidemia 09/27/2019   Type 2 diabetes mellitus with hyperglycemia, without long-term current use of insulin (HCC) 01/08/2018   Anxiety and depression 05/17/2017   Prediabetes 10/26/2016   FHx: colonic polyps 09/28/2016   Anxiety 05/11/2015   Essential hypertension, benign 05/11/2015   Reflux 05/11/2015   Lumbar spondylosis 03/26/2014   Arthritis 10/29/2013   Chronic back pain 04/11/2013   Insomnia 04/11/2013    Social Hx   Social History   Socioeconomic History   Marital status: Divorced    Spouse name: Not on file   Number of children: Not on file   Years of education: Not on file   Highest education level: Not on file  Occupational History   Not on file  Tobacco Use   Smoking status: Never    Passive exposure: Current   Smokeless tobacco: Never  Vaping Use   Vaping status: Never Used  Substance and Sexual Activity   Alcohol use: Yes     Comment: occ   Drug use: No   Sexual activity: Yes    Birth control/protection: None, Post-menopausal  Other Topics Concern   Not on file  Social History Narrative   Lives with her brother. Pt is on disability from her lumbar fusion.    Social Drivers of Corporate Investment Banker Strain: Low Risk  (09/20/2023)   Overall Financial Resource Strain (CARDIA)    Difficulty of Paying Living Expenses: Not hard at all  Food Insecurity: No Food Insecurity (09/20/2023)   Hunger Vital Sign    Worried About Running Out of Food in the Last Year: Never true    Ran Out of Food in the Last Year: Never true  Transportation Needs: No Transportation Needs (09/20/2023)   PRAPARE - Administrator, Civil Service (Medical): No    Lack of Transportation (Non-Medical): No  Physical Activity: Inactive (09/20/2023)   Exercise Vital Sign    Days of Exercise per Week: 0 days    Minutes of Exercise per Session: 0 min  Stress: No Stress Concern Present (09/20/2023)   [employer] of Occupational Health - Occupational Stress Questionnaire    Feeling of Stress : Only a little  Social Connections: Moderately Isolated (09/20/2023)   Social Connection and Isolation Panel    Frequency of Communication with Friends and Family: More than three times a week    Frequency of Social Gatherings with Friends and Family: More than three times a  week    Attends Religious Services: More than 4 times per year    Active Member of Clubs or Organizations: No    Attends Banker Meetings: Never    Marital Status: Divorced    Review of Systems Per HPI  Objective:  BP 124/72   Pulse 80   Ht 5' 5 (1.651 m)   Wt 163 lb (73.9 kg)   LMP 09/03/2013   SpO2 99%   BMI 27.12 kg/m      06/26/2024    4:10 PM 05/21/2024    1:54 PM 04/17/2024    9:43 AM  BP/Weight  Systolic BP 124 124 151  Diastolic BP 72 64 78  Wt. (Lbs) 163 158.6 170  BMI 27.12 kg/m2 26.39 kg/m2 28.29 kg/m2    Physical  Exam Vitals and nursing note reviewed. Exam conducted with a chaperone present.  Constitutional:      General: She is not in acute distress.    Appearance: Normal appearance.  HENT:     Head: Normocephalic and atraumatic.  Pulmonary:     Effort: Pulmonary effort is normal. No respiratory distress.  Genitourinary:    Comments: No discharge noted. Atrophy noted. Neurological:     Mental Status: She is alert.     Lab Results  Component Value Date   WBC 5.7 08/21/2023   HGB 13.0 08/21/2023   HCT 37.4 08/21/2023   PLT 312 08/21/2023   GLUCOSE 121 (H) 11/17/2023   CHOL 114 11/17/2023   TRIG 75 11/17/2023   HDL 62 11/17/2023   LDLCALC 37 11/17/2023   ALT 36 (H) 11/17/2023   AST 35 11/17/2023   NA 141 11/17/2023   K 4.2 11/17/2023   CL 102 11/17/2023   CREATININE 0.99 11/17/2023   BUN 8 11/17/2023   CO2 24 11/17/2023   TSH 2.380 08/02/2023   HGBA1C 5.2 05/21/2024   MICROALBUR 80 08/31/2020     Assessment & Plan:  Atrophic vaginitis Assessment & Plan: Treating with Estrace .  Orders: -     Estradiol ; Place 1 Applicatorful vaginally at bedtime. Can apply to the vulvar tissues as well. Initial: 0.5 to 1 g once daily for 2 weeks (Ref). Maintenance: 0.5 to 1 g one to three times per week (Ref).  Dispense: 42.5 g; Refill: 12    Follow-up:  Follow up with PCP  Jacqulyn Ahle DO [facility]

## 2024-06-27 NOTE — Assessment & Plan Note (Signed)
 Treating with Estrace.

## 2024-07-02 ENCOUNTER — Encounter: Payer: Self-pay | Admitting: Physical Medicine and Rehabilitation

## 2024-07-02 ENCOUNTER — Ambulatory Visit: Admitting: Physical Medicine and Rehabilitation

## 2024-07-02 DIAGNOSIS — M5416 Radiculopathy, lumbar region: Secondary | ICD-10-CM

## 2024-07-02 DIAGNOSIS — M961 Postlaminectomy syndrome, not elsewhere classified: Secondary | ICD-10-CM

## 2024-07-02 DIAGNOSIS — G8929 Other chronic pain: Secondary | ICD-10-CM

## 2024-07-02 DIAGNOSIS — M5441 Lumbago with sciatica, right side: Secondary | ICD-10-CM | POA: Diagnosis not present

## 2024-07-02 DIAGNOSIS — M5442 Lumbago with sciatica, left side: Secondary | ICD-10-CM

## 2024-07-02 NOTE — Progress Notes (Signed)
 Pain Scale   Average Pain 0 Patient advising she has lower back pain radiating to right leg at times, pain comes and goes at times.        +Driver, -BT, -Dye Allergies.

## 2024-07-02 NOTE — Progress Notes (Signed)
 DESLYN Scott - 63 y.o. female MRN [id number]  Date of birth: 07-Oct-1960  Office Visit Note: Visit Date: 07/02/2024 PCP: Edman Meade PEDLAR, FNP Referred by: Edman Meade PEDLAR, FNP  Subjective: Chief Complaint  Patient presents with   Lower Back - Pain   HPI: Laura Scott is a 63 y.o. female who comes in today per the request of Dr. Lemond Stable for evaluation of chronic, worsening and severe bilateral lower back pain radiating down right lateral leg, some intermittent referral of pain to left leg. Pain ongoing for several years, worsens with washing dishes, sweeping and cooking. She describes pain as sore and aching sensation, currently denies pain. Some relief of pain with home exercise regimen, rest and use of medications. History of formal physical therapy in the past with no relief of pain. Recent lumbar MRI imaging shows previous anterior and posterior fusion at L4-L5. Moderate facet arthropathy at L3-4 with mild spinal stenosis. Severe degenerative disc disease at L5-S1 with facet arthropathy. There is severe bilateral neural foraminal stenosis. No significant spinal stenosis. History of L4-L5 PLIF with Dr. Rockey Peru in 2015. She did undergo lumbar epidural steroid injections post surgery, however I am unable to see these notes. She also saw Dr. Penne Pouch with [facility]. She underwent bilateral L4-L5 and L5-S1 radiofrequency ablation with Dr. Pouch in 2020 with little to no improvement of pain. Patient denies focal weakness, numbness and tingling. No recent trauma or falls.       Review of Systems  Musculoskeletal:  Positive for back pain.  Neurological:  Negative for tingling, sensory change, focal weakness and weakness.  All other systems reviewed and are negative.  Otherwise per HPI.  Assessment & Plan: Visit Diagnoses:    ICD-10-CM   1. Chronic bilateral low back pain with bilateral sciatica  M54.42 Ambulatory referral to Physical  Medicine Rehab   M54.41    G89.29     2. Lumbar radiculopathy  M54.16 Ambulatory referral to Physical Medicine Rehab    3. Post laminectomy syndrome  M96.1 Ambulatory referral to Physical Medicine Rehab       Plan: Findings:  Chronic, worsening and severe bilateral lower back pain radiating down lateral legs, right greater than left. Patient continues to have severe pain despite good conservative therapies such as formal physical therapy, home exercise regimen, rest and use of medications. Recent lumbar MRI imaging shows severe degenerative disc disease at L5-S1 with facet arthropathy. There is also severe bilateral foraminal stenosis at this level. Moderate facet arthropathy above fusion at L3-L4. Patients clinical presentation and exam are consistent with lumbar radiculopathy, more of L5 nerve distribution. We discussed treatment plan in detail today. Next step is to perform diagnostic and hopefully therapeutic right L5-S1 interlaminar epidural steroid injection under fluoroscopic guidance. She is not currently taking anticoagulant medications. If good relief of pain with injection we can repeat this procedure infrequently as needed. Patient has no questions at this time. No red flag symptoms noted upon exam today.     Meds & Orders: No orders of the defined types were placed in this encounter.   Orders Placed This Encounter  Procedures   Ambulatory referral to Physical Medicine Rehab    Follow-up: Return for Right L5-S1 interlaminar epidural steroid injection.   Procedures: No procedures performed      Clinical History: CLINICAL DATA:  Low back pain with right leg pain   EXAM: MRI LUMBAR SPINE WITHOUT AND WITH CONTRAST   TECHNIQUE: Multiplanar and  multiecho pulse sequences of the lumbar spine were obtained without and with intravenous contrast.   CONTRAST:  7.5mL GADAVIST  GADOBUTROL  1 MMOL/ML IV SOLN   COMPARISON:  Jan 09, 2019   FINDINGS: Bone marrow: There are Modic type  2 changes at L5-S1   Conus and cauda equina: No significant abnormality   Paraspinal tissues: No significant abnormality   L1-L2: The disc is normal.  Mild facet arthropathy   L2-L3: There is very mild degenerative disc disease. Mild facet arthropathy   L3-L4: There is very mild degenerative disc disease. There is moderate facet arthropathy with facet enlargement and mild spinal stenosis.   L4-L5: Previous PLIF with posterolateral fusion. There is no spinal stenosis or foraminal stenosis   L5-S1: There is severe degenerative disc disease. There is a broad-based disc bulge which extends laterally on both sides. There is moderate facet arthropathy. There is no significant spinal stenosis. There is severe bilateral neural foraminal stenosis   IMPRESSION: 1. Previous anterior and posterior fusion at L4-5 2. Moderate facet arthropathy at L3-4 with mild spinal stenosis 3. Severe degenerative disc disease at L5-S1 with facet arthropathy. There is severe bilateral neural foraminal stenosis. No significant spinal stenosis 4. No significant change compared with Jan 09, 2019     Electronically Signed   By: Nancyann Burns M.D.   On: 04/25/2024 09:39   She reports that she has never smoked. She has been exposed to tobacco smoke. She has never used smokeless tobacco.  Recent Labs    08/02/23 1313 05/21/24 1408  HGBA1C 5.8* 5.2    Objective:  VS:  HT:    WT:   BMI:     BP:   HR: bpm  TEMP: ( )  RESP:  Physical Exam Vitals and nursing note reviewed.  HENT:     Head: Normocephalic and atraumatic.     Right Ear: External ear normal.     Left Ear: External ear normal.     Nose: Nose normal.     Mouth/Throat:     Mouth: Mucous membranes are moist.  Eyes:     Extraocular Movements: Extraocular movements intact.  Cardiovascular:     Rate and Rhythm: Normal rate.     Pulses: Normal pulses.  Pulmonary:     Effort: Pulmonary effort is normal.  Abdominal:     General: Abdomen  is flat. There is no distension.  Musculoskeletal:        General: Tenderness present.     Cervical back: Normal range of motion.     Comments: Patient rises from seated position to standing without difficulty. Good lumbar range of motion. No pain noted with facet loading. 5/5 strength noted with bilateral hip flexion, knee flexion/extension, ankle dorsiflexion/plantarflexion and EHL. No clonus noted bilaterally. No pain upon palpation of greater trochanters. No pain with internal/external rotation of bilateral hips. Sensation intact bilaterally. Dysesthesias noted to bilateral L5 dermatomes. Negative slump test bilaterally. Ambulates without aid, gait steady.     Skin:    General: Skin is warm and dry.     Capillary Refill: Capillary refill takes less than 2 seconds.  Neurological:     General: No focal deficit present.     Mental Status: She is alert and oriented to person, place, and time.  Psychiatric:        Mood and Affect: Mood normal.        Behavior: Behavior normal.     Ortho Exam  Imaging: No results found.  Past Medical/Family/Surgical/Social History:  Medications & Allergies reviewed per EMR, new medications updated. Patient Active Problem List   Diagnosis Date Noted   Atrophic vaginitis 06/27/2024   Long-term (current) use of injectable non-insulin antidiabetic drugs 02/20/2023   Elevated ferritin 01/27/2023   GERD (gastroesophageal reflux disease) 11/29/2022   Elevated liver enzymes 08/19/2022   Overweight (BMI 25.0-29.9) 04/26/2022   Annual physical exam 11/13/2021   Positive ANA (antinuclear antibody) 09/14/2021   Arthralgia of right acromioclavicular joint 09/14/2021   Allergies 09/14/2021   Vitamin D  deficiency 09/14/2021   Abdominal pain, epigastric 07/30/2021   Abnormal LFTs 07/30/2021   Mixed hyperlipidemia 09/27/2019   Type 2 diabetes mellitus with hyperglycemia, without long-term current use of insulin (HCC) 01/08/2018   Anxiety and depression  05/17/2017   Prediabetes 10/26/2016   FHx: colonic polyps 09/28/2016   Anxiety 05/11/2015   Essential hypertension, benign 05/11/2015   Reflux 05/11/2015   Lumbar spondylosis 03/26/2014   Arthritis 10/29/2013   Chronic back pain 04/11/2013   Insomnia 04/11/2013   Past Medical History:  Diagnosis Date   Allergic rhinitis    Anxiety    Chronic pain    Depression    Diabetes mellitus without complication (HCC)    GERD (gastroesophageal reflux disease)    Hyperlipidemia    Hypertension    IBS (irritable bowel syndrome)    PMS (premenstrual syndrome)    Reflux    Family History  Problem Relation Age of Onset   Hypertension Mother    Colon polyps Mother    Hypertension Father    Diabetes Father    Hypertension Sister    Hyperlipidemia Sister    Colon polyps Brother    Arthritis Brother    Diabetes Maternal Aunt    Hypertension Maternal Aunt    Rheum arthritis Maternal Grandmother    Prostate cancer Maternal Grandfather    Cancer Paternal Grandfather        not sure of type of cancer   Breast cancer Neg Hx    Cervical cancer Neg Hx    Past Surgical History:  Procedure Laterality Date   BREAST BIOPSY Right 12/07/2022   MM RT BREAST BX W LOC DEV 1ST LESION IMAGE BX SPEC STEREO GUIDE 12/07/2022 [facility]   COLONOSCOPY  08/29/2011   [name]: normal. high risk screening tcs in 5 years.    COLONOSCOPY WITH PROPOFOL  N/A 10/20/2016   Procedure: COLONOSCOPY WITH PROPOFOL ;  Surgeon: Lamar CHRISTELLA Hollingshead, MD;  [facility];  Service: Endoscopy;  Laterality: N/A;  10:45am   COLONOSCOPY WITH PROPOFOL  N/A 02/09/2022   Procedure: COLONOSCOPY WITH PROPOFOL ;  Surgeon: Hollingshead Lamar CHRISTELLA, MD;  Location: [facility];  Service: Endoscopy;  Laterality: N/A;  2:30 / ASA 2   ESOPHAGOGASTRODUODENOSCOPY (EGD) WITH PROPOFOL  N/A 10/20/2016   Procedure: ESOPHAGOGASTRODUODENOSCOPY (EGD) WITH PROPOFOL ;  Surgeon: Lamar CHRISTELLA Hollingshead, MD;  Location: [facility];  Service: Endoscopy;  Laterality: N/A;    LUMBAR DISC SURGERY     MALONEY DILATION N/A 10/20/2016   Procedure: AGAPITO DILATION;  Surgeon: Lamar CHRISTELLA Hollingshead, MD;  Location: [facility];  Service: Endoscopy;  Laterality: N/A;   POLYPECTOMY  10/20/2016   Procedure: POLYPECTOMY;  Surgeon: Lamar CHRISTELLA Hollingshead, MD;  Location: [facility];  Service: Endoscopy;;  cecal and sigmoid polypectomies   Social History   Occupational History   Not on file  Tobacco Use   Smoking status: Never    Passive exposure: Current   Smokeless tobacco: Never  Vaping Use   Vaping status:  Never Used  Substance and Sexual Activity   Alcohol use: Yes    Comment: occ   Drug use: No   Sexual activity: Yes    Birth control/protection: None, Post-menopausal

## 2024-07-03 ENCOUNTER — Telehealth: Payer: Self-pay

## 2024-07-03 DIAGNOSIS — F411 Generalized anxiety disorder: Secondary | ICD-10-CM

## 2024-07-03 MED ORDER — DIAZEPAM 10 MG PO TABS: ORAL_TABLET | ORAL | 0 refills | Status: DC

## 2024-07-03 NOTE — Telephone Encounter (Signed)
 Pre procedural Valium

## 2024-07-29 ENCOUNTER — Other Ambulatory Visit: Payer: Self-pay | Admitting: Physical Medicine and Rehabilitation

## 2024-07-29 MED ORDER — DIAZEPAM 5 MG PO TABS: ORAL_TABLET | ORAL | 0 refills | Status: AC

## 2024-07-29 NOTE — Progress Notes (Signed)
 Patient arrived at office today, some confusion surrounding injection appointment. She is scheduled for injection this Wednesday 12/10. I called in pre-procedure Valium .

## 2024-07-31 ENCOUNTER — Ambulatory Visit: Admitting: Physical Medicine and Rehabilitation

## 2024-07-31 ENCOUNTER — Other Ambulatory Visit: Payer: Self-pay | Admitting: Family Medicine

## 2024-07-31 ENCOUNTER — Other Ambulatory Visit: Payer: Self-pay

## 2024-07-31 VITALS — BP 146/81 | HR 87

## 2024-07-31 DIAGNOSIS — M5416 Radiculopathy, lumbar region: Secondary | ICD-10-CM

## 2024-07-31 DIAGNOSIS — G479 Sleep disorder, unspecified: Secondary | ICD-10-CM

## 2024-07-31 MED ORDER — METHYLPREDNISOLONE ACETATE 40 MG/ML IJ SUSP
40.0000 mg | Freq: Once | INTRAMUSCULAR | Status: AC
  Administered 2024-07-31: 40 mg

## 2024-07-31 NOTE — Progress Notes (Unsigned)
 Pain Scale   Average Pain 5 Patient advising she has chronic lower back pain that radiates to right leg. Pain increases when walking        +Driver, -BT, -Dye Allergies.

## 2024-07-31 NOTE — Telephone Encounter (Signed)
 Copied from CRM #[id number]. Topic: Clinical - Medication Refill >> Jul 31, 2024 12:47 PM Berwyn MATSU wrote: Medication: hydrOXYzine  (VISTARIL ) 50 MG capsule   Has the patient contacted their pharmacy? Yes (Agent: If no, request that the patient contact the pharmacy for the refill. If patient does not wish to contact the pharmacy document the reason why and proceed with request.) (Agent: If yes, when and what did the pharmacy advise?)  This is the patient's preferred pharmacy:  [facility] - [place], [place] - [address] [place] [address] Phone: [phone] Fax: [phone]  Is this the correct pharmacy for this prescription? Yes If no, delete pharmacy and type the correct one.   Has the prescription been filled recently? Yes  Is the patient out of the medication? Yes  Has the patient been seen for an appointment in the last year OR does the patient have an upcoming appointment? Yes  Can we respond through MyChart? Yes  Agent: Please be advised that Rx refills may take up to 3 business days. We ask that you follow-up with your pharmacy.

## 2024-08-01 MED ORDER — HYDROXYZINE PAMOATE 50 MG PO CAPS: 50.0000 mg | ORAL_CAPSULE | Freq: Every evening | ORAL | 0 refills | Status: DC | PRN

## 2024-08-12 NOTE — Progress Notes (Signed)
 Pharmacy Quality Measure Review  This patient is appearing on a report for being at risk of failing the adherence measure for diabetes medications this calendar year.   Medication: Mounjaro  7.5 mg Last fill date: 07/23/24 for 28 day supply  Insurance report was not up to date. No action needed at this time.   Jenkins Graces, PharmD PGY1 Pharmacy Resident

## 2024-08-12 NOTE — Progress Notes (Signed)
 "  Laura Scott - 63 y.o. female MRN [id number]  Date of birth: 25-Apr-1961  Office Visit Note: Visit Date: 07/31/2024 PCP: Edman Meade PEDLAR, FNP Referred by: Edman Meade PEDLAR, FNP  Subjective: Chief Complaint  Patient presents with   Lower Back - Pain   HPI:  Laura Scott is a 63 y.o. female who comes in today at the request of Duwaine Pouch, FNP for planned Right L5-S1 Lumbar Interlaminar epidural steroid injection with fluoroscopic guidance.  The patient has failed conservative care including home exercise, medications, time and activity modification.  This injection will be diagnostic and hopefully therapeutic.  Please see requesting physician notes for further details and justification.   ROS Otherwise per HPI.  Assessment & Plan: Visit Diagnoses:    ICD-10-CM   1. Lumbar radiculopathy  M54.16 XR C-ARM NO REPORT    Epidural Steroid injection    methylPREDNISolone  acetate (DEPO-MEDROL ) injection 40 mg      Plan: No additional findings.   Meds & Orders:  Meds ordered this encounter  Medications   methylPREDNISolone  acetate (DEPO-MEDROL ) injection 40 mg    Orders Placed This Encounter  Procedures   XR C-ARM NO REPORT   Epidural Steroid injection    Follow-up: Return for visit to requesting provider as needed.   Procedures: No procedures performed  Lumbar Epidural Steroid Injection - Interlaminar Approach with Fluoroscopic Guidance  Patient: Laura Scott      Date of Birth: 01-02-1961 MRN: [id number] PCP: Edman Meade PEDLAR, FNP      Visit Date: 07/31/2024   Universal Protocol:     Consent Given By: the patient  Position: PRONE  Additional Comments: Vital signs were monitored before and after the procedure. Patient was prepped and draped in the usual sterile fashion. The correct patient, procedure, and site was verified.   Injection Procedure Details:   Procedure diagnoses: Lumbar radiculopathy [M54.16]   Meds Administered:  Meds ordered  this encounter  Medications   methylPREDNISolone  acetate (DEPO-MEDROL ) injection 40 mg     Laterality: Right  Location/Site:  L5-S1  Needle: 3.5 in., 20 ga. Tuohy  Needle Placement: Paramedian epidural  Findings:   -Comments: Excellent flow of contrast into the epidural space.  Procedure Details: Using a paramedian approach from the side mentioned above, the region overlying the inferior lamina was localized under fluoroscopic visualization and the soft tissues overlying this structure were infiltrated with 4 ml. of 1% Lidocaine  without Epinephrine . The Tuohy needle was inserted into the epidural space using a paramedian approach.   The epidural space was localized using loss of resistance along with counter oblique bi-planar fluoroscopic views.  After negative aspirate for air, blood, and CSF, a 2 ml. volume of Isovue-250 was injected into the epidural space and the flow of contrast was observed. Radiographs were obtained for documentation purposes.    The injectate was administered into the level noted above.   Additional Comments:  The patient tolerated the procedure well Dressing: 2 x 2 sterile gauze and Band-Aid    Post-procedure details: Patient was observed during the procedure. Post-procedure instructions were reviewed.  Patient left the clinic in stable condition.   Clinical History: CLINICAL DATA:  Low back pain with right leg pain   EXAM: MRI LUMBAR SPINE WITHOUT AND WITH CONTRAST   TECHNIQUE: Multiplanar and multiecho pulse sequences of the lumbar spine were obtained without and with intravenous contrast.   CONTRAST:  7.5mL GADAVIST  GADOBUTROL  1 MMOL/ML IV SOLN   COMPARISON:  Jan 09, 2019  FINDINGS: Bone marrow: There are Modic type 2 changes at L5-S1   Conus and cauda equina: No significant abnormality   Paraspinal tissues: No significant abnormality   L1-L2: The disc is normal.  Mild facet arthropathy   L2-L3: There is very mild degenerative  disc disease. Mild facet arthropathy   L3-L4: There is very mild degenerative disc disease. There is moderate facet arthropathy with facet enlargement and mild spinal stenosis.   L4-L5: Previous PLIF with posterolateral fusion. There is no spinal stenosis or foraminal stenosis   L5-S1: There is severe degenerative disc disease. There is a broad-based disc bulge which extends laterally on both sides. There is moderate facet arthropathy. There is no significant spinal stenosis. There is severe bilateral neural foraminal stenosis   IMPRESSION: 1. Previous anterior and posterior fusion at L4-5 2. Moderate facet arthropathy at L3-4 with mild spinal stenosis 3. Severe degenerative disc disease at L5-S1 with facet arthropathy. There is severe bilateral neural foraminal stenosis. No significant spinal stenosis 4. No significant change compared with Jan 09, 2019     Electronically Signed   By: Nancyann Burns M.D.   On: 04/25/2024 09:39     Objective:  VS:  HT:    WT:   BMI:     BP:(!) 146/81  HR:87bpm  TEMP: ( )  RESP:  Physical Exam Vitals and nursing note reviewed.  Constitutional:      General: She is not in acute distress.    Appearance: Normal appearance. She is not ill-appearing.  HENT:     Head: Normocephalic and atraumatic.     Right Ear: External ear normal.     Left Ear: External ear normal.  Eyes:     Extraocular Movements: Extraocular movements intact.  Cardiovascular:     Rate and Rhythm: Normal rate.     Pulses: Normal pulses.  Pulmonary:     Effort: Pulmonary effort is normal. No respiratory distress.  Abdominal:     General: There is no distension.     Palpations: Abdomen is soft.  Musculoskeletal:        General: Tenderness present.     Cervical back: Neck supple.     Right lower leg: No edema.     Left lower leg: No edema.     Comments: Patient has good distal strength with no pain over the greater trochanters.  No clonus or focal weakness.   Skin:    Findings: No erythema, lesion or rash.  Neurological:     General: No focal deficit present.     Mental Status: She is alert and oriented to person, place, and time.     Sensory: No sensory deficit.     Motor: No weakness or abnormal muscle tone.     Coordination: Coordination normal.  Psychiatric:        Mood and Affect: Mood normal.        Behavior: Behavior normal.      Imaging: No results found. "

## 2024-08-12 NOTE — Procedures (Signed)
 Lumbar Epidural Steroid Injection - Interlaminar Approach with Fluoroscopic Guidance  Patient: Laura Scott      Date of Birth: 1961-03-05 MRN: [id number] PCP: Edman Meade PEDLAR, FNP      Visit Date: 07/31/2024   Universal Protocol:     Consent Given By: the patient  Position: PRONE  Additional Comments: Vital signs were monitored before and after the procedure. Patient was prepped and draped in the usual sterile fashion. The correct patient, procedure, and site was verified.   Injection Procedure Details:   Procedure diagnoses: Lumbar radiculopathy [M54.16]   Meds Administered:  Meds ordered this encounter  Medications   methylPREDNISolone  acetate (DEPO-MEDROL ) injection 40 mg     Laterality: Right  Location/Site:  L5-S1  Needle: 3.5 in., 20 ga. Tuohy  Needle Placement: Paramedian epidural  Findings:   -Comments: Excellent flow of contrast into the epidural space.  Procedure Details: Using a paramedian approach from the side mentioned above, the region overlying the inferior lamina was localized under fluoroscopic visualization and the soft tissues overlying this structure were infiltrated with 4 ml. of 1% Lidocaine  without Epinephrine . The Tuohy needle was inserted into the epidural space using a paramedian approach.   The epidural space was localized using loss of resistance along with counter oblique bi-planar fluoroscopic views.  After negative aspirate for air, blood, and CSF, a 2 ml. volume of Isovue-250 was injected into the epidural space and the flow of contrast was observed. Radiographs were obtained for documentation purposes.    The injectate was administered into the level noted above.   Additional Comments:  The patient tolerated the procedure well Dressing: 2 x 2 sterile gauze and Band-Aid    Post-procedure details: Patient was observed during the procedure. Post-procedure instructions were reviewed.  Patient left the clinic in stable  condition.

## 2024-08-22 ENCOUNTER — Encounter: Payer: Self-pay | Admitting: Gastroenterology

## 2024-08-29 ENCOUNTER — Other Ambulatory Visit: Payer: Self-pay | Admitting: Family Medicine

## 2024-08-29 ENCOUNTER — Ambulatory Visit: Admitting: Family Medicine

## 2024-08-29 DIAGNOSIS — K219 Gastro-esophageal reflux disease without esophagitis: Secondary | ICD-10-CM

## 2024-08-29 DIAGNOSIS — I10 Essential (primary) hypertension: Secondary | ICD-10-CM

## 2024-08-29 MED ORDER — LOSARTAN POTASSIUM 50 MG PO TABS: 50.0000 mg | ORAL_TABLET | Freq: Every day | ORAL | 1 refills | Status: AC

## 2024-08-29 MED ORDER — OMEPRAZOLE 40 MG PO CPDR: 40.0000 mg | DELAYED_RELEASE_CAPSULE | Freq: Every day | ORAL | 0 refills | Status: AC

## 2024-08-29 NOTE — Telephone Encounter (Signed)
 Copied from CRM #[id number]. Topic: Clinical - Medication Refill >> Aug 29, 2024 12:56 PM [name] wrote: Medication:  losartan  (COZAAR ) 50 MG tablet omeprazole  (PRILOSEC) 40 MG capsule  Has the patient contacted their pharmacy? Yes, pharmacy stated that they were unable to fill due to one medication expiring (losartan  (COZAAR ) 50 MG tablet) and other no longer having refills (omeprazole  (PRILOSEC) 40 MG capsule).  (Agent: If no, request that the patient contact the pharmacy for the refill. If patient does not wish to contact the pharmacy document the reason why and proceed with request.) (Agent: If yes, when and what did the pharmacy advise?)  This is the patient's preferred pharmacy:  [facility] - [place], [place] - [address] [place] [address] Phone: [phone] Fax: [phone]  Is this the correct pharmacy for this prescription? Yes If no, delete pharmacy and type the correct one.   Has the prescription been filled recently? No  Is the patient out of the medication? Yes  Has the patient been seen for an appointment in the last year OR does the patient have an upcoming appointment? Yes  Can we respond through MyChart? Yes  Agent: Please be advised that Rx refills may take up to 3 business days. We ask that you follow-up with your pharmacy.

## 2024-09-01 ENCOUNTER — Other Ambulatory Visit: Payer: Self-pay | Admitting: Family Medicine

## 2024-09-01 DIAGNOSIS — E782 Mixed hyperlipidemia: Secondary | ICD-10-CM

## 2024-09-21 LAB — COMPREHENSIVE METABOLIC PANEL WITH GFR
ALT: 28 [IU]/L (ref 0–32)
AST: 26 [IU]/L (ref 0–40)
Albumin: 5 g/dL — ABNORMAL HIGH (ref 3.9–4.9)
Alkaline Phosphatase: 86 [IU]/L (ref 49–135)
BUN/Creatinine Ratio: 13 (ref 12–28)
BUN: 13 mg/dL (ref 8–27)
Bilirubin Total: 1 mg/dL (ref 0.0–1.2)
CO2: 23 mmol/L (ref 20–29)
Calcium: 10.5 mg/dL — ABNORMAL HIGH (ref 8.7–10.3)
Chloride: 94 mmol/L — ABNORMAL LOW (ref 96–106)
Creatinine, Ser: 0.98 mg/dL (ref 0.57–1.00)
Globulin, Total: 3 g/dL (ref 1.5–4.5)
Glucose: 100 mg/dL — ABNORMAL HIGH (ref 70–99)
Potassium: 4.1 mmol/L (ref 3.5–5.2)
Sodium: 138 mmol/L (ref 134–144)
Total Protein: 8 g/dL (ref 6.0–8.5)
eGFR: 65 mL/min/{1.73_m2}

## 2024-09-21 LAB — LIPID PANEL
Chol/HDL Ratio: 3.1 ratio (ref 0.0–4.4)
Cholesterol, Total: 166 mg/dL (ref 100–199)
HDL: 54 mg/dL
LDL Chol Calc (NIH): 90 mg/dL (ref 0–99)
Triglycerides: 121 mg/dL (ref 0–149)
VLDL Cholesterol Cal: 22 mg/dL (ref 5–40)

## 2024-09-21 LAB — FIB-4 W/REFLEX TO ELF
FIB-4 Index: 0.78 (ref 0.00–2.67)
Platelets: 398 10*3/uL (ref 150–450)

## 2024-09-21 LAB — TSH: TSH: 3.09 u[IU]/mL (ref 0.450–4.500)

## 2024-09-21 LAB — T4, FREE: Free T4: 1.41 ng/dL (ref 0.82–1.77)

## 2024-09-23 ENCOUNTER — Ambulatory Visit: Payer: Self-pay

## 2024-09-23 NOTE — Telephone Encounter (Signed)
 FYI Only or Action Required?: FYI only for provider: appointment scheduled on 09/24/24.  Patient was last seen in primary care on 06/26/2024 by Cook, Jayce G, DO.  Called Nurse Triage reporting Memory Loss.  Symptoms began several months ago.  Interventions attempted: Nothing.  Symptoms are: stable.  Triage Disposition: See Physician Within 24 Hours  Patient/caregiver understands and will follow disposition?: Yes   Reason for Disposition  [1] Confusion getting worse AND [2] slow onset (days to weeks)  Answer Assessment - Initial Assessment Questions 1. MAIN CONCERN OR SYMPTOM:  What is your main concern right now? What questions do you have? What's the main symptom you're worried about? (e.g., confusion, memory loss)     Repeating herself, feels like short term memory is affected, will be talking on the phone and can't remember what she's saying and repeats stories  2. ONSET:  When did the symptom start (or worsen)? (minutes, hours, days, weeks)     Years, but worsening as of alte  3. BETTER-SAME-WORSE: Are you (the patient) getting better, staying the same, or getting worse compared to the day you (they) were diagnosed or most recent hospital discharge?     Worsening  4. DIAGNOSIS: Was the dementia diagnosed by a doctor? If Yes, ask: When? (e.g., days, months, years ago)     Denies  5. MEDICINES: Has there been any change in medicines recently? (e.g., narcotics, antihistamines, benzodiazepines, etc.)     Denies  6. OTHER SYMPTOMS: Are there any other symptoms? (e.g., cough, falling, fever, pain)     Cramp in neck, reports going on for a while off and on  Protocols used: Dementia Symptoms and Questions-A-AH  Message from Sanford P sent at 09/23/2024  1:46 PM EST  Reason for Triage: Pt would like a test for early dementia. Says she is very forgetful and repeats herself several times and would like to be tested.  Says could be alzhiemers says she forgets a lot  and happens several times throughout the day.   Not sure if can be related to CONFUSION/ STROKE/ NUMBNESS - sending pink just in case.

## 2024-09-24 ENCOUNTER — Ambulatory Visit

## 2024-09-24 ENCOUNTER — Ambulatory Visit: Payer: Self-pay

## 2024-09-24 ENCOUNTER — Ambulatory Visit: Payer: Medicaid Other

## 2024-09-24 ENCOUNTER — Telehealth: Payer: Self-pay

## 2024-09-24 VITALS — BP 120/74 | Ht 65.5 in | Wt 160.0 lb

## 2024-09-24 DIAGNOSIS — Z1231 Encounter for screening mammogram for malignant neoplasm of breast: Secondary | ICD-10-CM

## 2024-09-24 DIAGNOSIS — Z0000 Encounter for general adult medical examination without abnormal findings: Secondary | ICD-10-CM

## 2024-09-24 NOTE — Telephone Encounter (Signed)
 scheduled

## 2024-09-24 NOTE — Telephone Encounter (Signed)
 Copied from CRM [id number]. Topic: Clinical - Request for Lab/Test Order >> Sep 23, 2024  1:39 PM Ivette P wrote: Reason for CRM: PT calling in to get a bone density test done. Was told by  nurse that is overdue.

## 2024-09-25 ENCOUNTER — Other Ambulatory Visit: Payer: Self-pay | Admitting: Family Medicine

## 2024-09-25 ENCOUNTER — Ambulatory Visit: Admitting: "Endocrinology

## 2024-09-25 ENCOUNTER — Encounter: Payer: Self-pay | Admitting: "Endocrinology

## 2024-09-25 ENCOUNTER — Other Ambulatory Visit: Payer: Self-pay

## 2024-09-25 VITALS — BP 110/56 | HR 76 | Ht 65.0 in | Wt 165.0 lb

## 2024-09-25 DIAGNOSIS — E1165 Type 2 diabetes mellitus with hyperglycemia: Secondary | ICD-10-CM

## 2024-09-25 DIAGNOSIS — E782 Mixed hyperlipidemia: Secondary | ICD-10-CM

## 2024-09-25 DIAGNOSIS — Z7985 Long-term (current) use of injectable non-insulin antidiabetic drugs: Secondary | ICD-10-CM | POA: Diagnosis not present

## 2024-09-25 DIAGNOSIS — I10 Essential (primary) hypertension: Secondary | ICD-10-CM

## 2024-09-25 DIAGNOSIS — Z13820 Encounter for screening for osteoporosis: Secondary | ICD-10-CM

## 2024-09-25 DIAGNOSIS — G479 Sleep disorder, unspecified: Secondary | ICD-10-CM

## 2024-09-25 LAB — POCT GLYCOSYLATED HEMOGLOBIN (HGB A1C)

## 2024-09-25 MED ORDER — ATORVASTATIN CALCIUM 40 MG PO TABS: 40.0000 mg | ORAL_TABLET | Freq: Every day | ORAL | 3 refills | Status: AC

## 2024-09-25 MED ORDER — HYDROXYZINE PAMOATE 50 MG PO CAPS: 50.0000 mg | ORAL_CAPSULE | Freq: Every evening | ORAL | 0 refills | Status: AC | PRN

## 2024-09-25 MED ORDER — TIRZEPATIDE 7.5 MG/0.5ML ~~LOC~~ SOAJ: 7.5000 mg | SUBCUTANEOUS | 1 refills | Status: AC

## 2024-09-25 MED ORDER — METFORMIN HCL 500 MG PO TABS: 500.0000 mg | ORAL_TABLET | Freq: Every day | ORAL | 1 refills | Status: AC

## 2024-09-25 NOTE — Patient Instructions (Signed)

## 2024-09-25 NOTE — Progress Notes (Signed)
 "                                                                                  09/25/2024, 10:37 AM    Endocrinology follow-up note    Subjective:    Patient ID: Laura Scott, female    DOB: 1961/04/12.  Laura Scott is being seen in follow-up after she was seen in consultation for  management of currently controlled symptomatic diabetes requested by  Edman Meade PEDLAR, FNP.   Past Medical History:  Diagnosis Date   Allergic rhinitis    Anxiety    Chronic pain    Depression    Diabetes mellitus without complication (HCC)    GERD (gastroesophageal reflux disease)    Hyperlipidemia    Hypertension    IBS (irritable bowel syndrome)    PMS (premenstrual syndrome)    Reflux     Past Surgical History:  Procedure Laterality Date   BREAST BIOPSY Right 12/07/2022   MM RT BREAST BX W LOC DEV 1ST LESION IMAGE BX SPEC STEREO GUIDE 12/07/2022 [facility]   COLONOSCOPY  08/29/2011   [name]: normal. high risk screening tcs in 5 years.    COLONOSCOPY WITH PROPOFOL  N/A 10/20/2016   Procedure: COLONOSCOPY WITH PROPOFOL ;  Surgeon: Lamar CHRISTELLA Hollingshead, MD;  [facility];  Service: Endoscopy;  Laterality: N/A;  10:45am   COLONOSCOPY WITH PROPOFOL  N/A 02/09/2022   Procedure: COLONOSCOPY WITH PROPOFOL ;  Surgeon: Hollingshead Lamar CHRISTELLA, MD;  Location: [facility];  Service: Endoscopy;  Laterality: N/A;  2:30 / ASA 2   ESOPHAGOGASTRODUODENOSCOPY (EGD) WITH PROPOFOL  N/A 10/20/2016   Procedure: ESOPHAGOGASTRODUODENOSCOPY (EGD) WITH PROPOFOL ;  Surgeon: Lamar CHRISTELLA Hollingshead, MD;  [facility];  Service: Endoscopy;  Laterality: N/A;   LUMBAR DISC SURGERY     MALONEY DILATION N/A 10/20/2016   Procedure: AGAPITO DILATION;  Surgeon: Lamar CHRISTELLA Hollingshead, MD;  [facility]: [facility];  Service: Endoscopy;  Laterality: N/A;   POLYPECTOMY  10/20/2016   Procedure: POLYPECTOMY;  Surgeon: Lamar CHRISTELLA Hollingshead, MD;  Location: [facility];  Service: Endoscopy;;  cecal and sigmoid polypectomies    Social  History   Socioeconomic History   Marital status: Divorced    Spouse name: Not on file   Number of children: Not on file   Years of education: Not on file   Highest education level: Not on file  Occupational History   Not on file  Tobacco Use   Smoking status: Never    Passive exposure: Current   Smokeless tobacco: Never  Vaping Use   Vaping status: Never Used  Substance and Sexual Activity   Alcohol use: Yes    Comment: occ   Drug use: No   Sexual activity: Yes    Birth control/protection: None, Post-menopausal  Other Topics Concern   Not on file  Social History Narrative   Lives with her brother. Pt is on disability from her lumbar fusion.    Social Drivers of Health   Tobacco Use: Medium Risk (09/25/2024)   Patient History    Smoking Tobacco Use: Never    Smokeless Tobacco Use: Never    Passive Exposure: Current  Financial Resource Strain: Low Risk (09/20/2023)   Overall Financial Resource Strain (CARDIA)    Difficulty of Paying Living Expenses: Not hard at all  Food Insecurity: No Food Insecurity (09/24/2024)   Epic    Worried About Programme Researcher, Broadcasting/film/video in the Last Year: Never true    Ran Out of Food in the Last Year: Never true  Transportation Needs: No Transportation Needs (09/24/2024)   Epic    Lack of Transportation (Medical): No    Lack of Transportation (Non-Medical): No  Physical Activity: Sufficiently Active (09/24/2024)   Exercise Vital Sign    Days of Exercise per Week: 7 days    Minutes of Exercise per Session: 30 min  Stress: No Stress Concern Present (09/24/2024)   [employer] of Occupational Health - Occupational Stress Questionnaire    Feeling of Stress: Not at all  Social Connections: Moderately Isolated (09/24/2024)   Social Connection and Isolation Panel    Frequency of Communication with Friends and Family: More than three times a week    Frequency of Social Gatherings with Friends and Family: More than three times a week    Attends Religious  Services: More than 4 times per year    Active Member of Clubs or Organizations: No    Attends Banker Meetings: Never    Marital Status: Divorced  Depression (PHQ2-9): Medium Risk (09/24/2024)   Depression (PHQ2-9)    PHQ-2 Score: 6  Alcohol Screen: Low Risk (09/20/2023)   Alcohol Screen    Last Alcohol Screening Score (AUDIT): 0  Housing: Low Risk (09/24/2024)   Epic    Unable to Pay for Housing in the Last Year: No    Number of Times Moved in the Last Year: 0    Homeless in the Last Year: No  Utilities: Not At Risk (09/24/2024)   Epic    Threatened with loss of utilities: No  Health Literacy: Adequate Health Literacy (09/24/2024)   B1300 Health Literacy    Frequency of need for help with medical instructions: Never    Family History  Problem Relation Age of Onset   Hypertension Mother    Colon polyps Mother    Hypertension Father    Diabetes Father    Hypertension Sister    Hyperlipidemia Sister    Colon polyps Brother    Arthritis Brother    Diabetes Maternal Aunt    Hypertension Maternal Aunt    Rheum arthritis Maternal Grandmother    Prostate cancer Maternal Grandfather    Cancer Paternal Grandfather        not sure of type of cancer   Breast cancer Neg Hx    Cervical cancer Neg Hx     Outpatient Encounter Medications as of 09/25/2024  Medication Sig   albuterol  (VENTOLIN  HFA) 108 (90 Base) MCG/ACT inhaler Inhale 1-2 puffs into the lungs every 6 (six) hours as needed for wheezing or shortness of breath.   ALPRAZolam  (XANAX ) 0.25 MG tablet Take 1 tablet (0.25 mg total) by mouth as needed for anxiety.   atorvastatin  (LIPITOR) 40 MG tablet Take 1 tablet (40 mg total) by mouth daily.   budesonide -formoterol  (SYMBICORT ) 160-4.5 MCG/ACT inhaler Inhale 2 puffs into the lungs 2 (two) times daily. (Patient taking differently: Inhale 2 puffs into the lungs daily as needed (Shortness of breath).)   Cholecalciferol (VITAMIN D ) 125 MCG (5000 UT) CAPS Take 5,000 Units by  mouth daily with breakfast.   diazepam  (VALIUM ) 5 MG tablet Take one tablet by mouth with  light food one hour prior to procedure.   diclofenac  Sodium (VOLTAREN ) 1 % GEL Apply 2-4 grams to affected joint 4 times daily as needed.   escitalopram  (LEXAPRO ) 20 MG tablet TAKE ONE TABLET BY MOUTH EVERY DAY   estradiol  (ESTRACE ) 0.01 % CREA vaginal cream Place 1 Applicatorful vaginally at bedtime. Can apply to the vulvar tissues as well. Initial: 0.5 to 1 g once daily for 2 weeks (Ref). Maintenance: 0.5 to 1 g one to three times per week (Ref).   fluticasone  (FLONASE ) 50 MCG/ACT nasal spray Place 2 sprays into both nostrils daily. (Patient taking differently: Place 2 sprays into both nostrils daily as needed for allergies.)   GABAPENTIN , ONCE-DAILY, PO Take 1 tablet by mouth daily.   glucose blood (ACCU-CHEK GUIDE) test strip CHECK BLOOD SUGAR ONCE  DAILY   hydrochlorothiazide  (HYDRODIURIL ) 25 MG tablet TAKE 1 TABLET BY MOUTH ONCE DAILY AS NEEDED   hydrOXYzine  (VISTARIL ) 50 MG capsule Take 1 capsule (50 mg total) by mouth at bedtime as needed.   losartan  (COZAAR ) 50 MG tablet Take 1 tablet (50 mg total) by mouth daily.   metFORMIN  (GLUCOPHAGE ) 500 MG tablet Take 1 tablet (500 mg total) by mouth daily with breakfast.   Omega-3 Fatty Acids (FISH OIL  OMEGA-3) 1000 MG CAPS Take 1 g by mouth daily.   omeprazole  (PRILOSEC) 40 MG capsule Take 1 capsule (40 mg total) by mouth daily.   Probiotic Product (PROBIOTIC DAILY PO) Take by mouth.   tirzepatide  (MOUNJARO ) 7.5 MG/0.5ML Pen Inject 7.5 mg into the skin once a week.   tiZANidine  (ZANAFLEX ) 4 MG tablet Take 1 tablet (4 mg total) by mouth every 6 (six) hours as needed for muscle spasms.   [DISCONTINUED] metFORMIN  (GLUCOPHAGE ) 500 MG tablet Take 1 tablet (500 mg total) by mouth 2 (two) times daily after a meal. (Patient taking differently: Take 500 mg by mouth daily with breakfast.)   [DISCONTINUED] tirzepatide  (MOUNJARO ) 7.5 MG/0.5ML Pen Inject 7.5 mg into the  skin once a week.   No facility-administered encounter medications on file as of 09/25/2024.    ALLERGIES: Allergies  Allergen Reactions   Sulfa Antibiotics Hives, Diarrhea and Itching   Other     SURGICAL TAPE Reaction: rash    Penicillins Hives, Itching and Rash    Pt states that she is able to take PCN  Has patient had a PCN reaction causing immediate rash, facial/tongue/throat swelling, SOB or lightheadedness with hypotension: Yes Has patient had a PCN reaction causing severe rash involving mucus membranes or skin necrosis: No Has patient had a PCN reaction that required hospitalization: No Has patient had a PCN reaction occurring within the last 10 years: Yes If all of the above answers are NO, then may proceed with Cephalosporin use.    VACCINATION STATUS: Immunization History  Administered Date(s) Administered   Influenza, Seasonal, Injecte, Preservative Fre 07/31/2023   Influenza,inj,Quad PF,6+ Mos 05/31/2018, 08/07/2021, 11/12/2021   Influenza-Unspecified 05/23/2011   Moderna Covid-19 Vaccine Bivalent Booster 64yrs & up 09/07/2021   Moderna Sars-Covid-2 Vaccination 11/02/2019, 12/04/2019, 08/06/2020   Pneumococcal Polysaccharide-23 08/20/2018    Diabetes She presents for her follow-up diabetic visit. She has type 2 diabetes mellitus. Onset time: She was diagnosed at approximate age of 35 years. Her disease course has been stable. There are no hypoglycemic associated symptoms. Pertinent negatives for hypoglycemia include no confusion, headaches, pallor or seizures. There are no diabetic associated symptoms. Pertinent negatives for diabetes include no chest pain, no polydipsia, no polyphagia and no polyuria.  There are no hypoglycemic complications. Symptoms are stable. There are no diabetic complications. Risk factors for coronary artery disease include diabetes mellitus, dyslipidemia, family history, hypertension, post-menopausal and sedentary lifestyle. Current diabetic  treatment includes oral agent (monotherapy). Her weight is decreasing steadily. She is following a generally unhealthy diet. When asked about meal planning, she reported none. She has not had a previous visit with a dietitian. She rarely participates in exercise. Her home blood glucose trend is fluctuating minimally. (She presents with controlled glycemic profile with point-of-care A1c of 5.3%.    She remains on Mounjaro  and metformin .   ) An ACE inhibitor/angiotensin II receptor blocker is being taken. She does not see a podiatrist.Eye exam is current.  Hyperlipidemia This is a chronic problem. The current episode started more than 1 year ago. The problem is uncontrolled. Recent lipid tests were reviewed and are variable. Exacerbating diseases include diabetes. Factors aggravating her hyperlipidemia include fatty foods. Pertinent negatives include no chest pain, myalgias or shortness of breath. Current antihyperlipidemic treatment includes statins. Risk factors for coronary artery disease include dyslipidemia, diabetes mellitus, hypertension, a sedentary lifestyle and post-menopausal.  Hypertension This is a chronic problem. The current episode started more than 1 year ago. The problem has been resolved since onset. The problem is controlled. Pertinent negatives include no chest pain, headaches, palpitations or shortness of breath. Risk factors for coronary artery disease include dyslipidemia, diabetes mellitus, family history, sedentary lifestyle and post-menopausal state. Past treatments include diuretics and angiotensin blockers. The current treatment provides moderate improvement. There are no compliance problems.    Review of systems  Constitutional: + Minimally fluctuating body weight,  current Body mass index is 27.46 kg/m. , no fatigue, no subjective hyperthermia, no subjective hypothermia  Respiratory: no cough, no shortness of breath Gastrointestinal: no nausea/vomiting, +  diarrhea   Objective:    BP (!) 110/56   Pulse 76   Ht 5' 5 (1.651 m)   Wt 165 lb (74.8 kg)   LMP 09/03/2013   BMI 27.46 kg/m   Wt Readings from Last 3 Encounters:  09/25/24 165 lb (74.8 kg)  09/24/24 160 lb (72.6 kg)  06/26/24 163 lb (73.9 kg)     BP Readings from Last 3 Encounters:  09/25/24 (!) 110/56  09/24/24 120/74  07/31/24 (!) 146/81     Physical Exam- Limited  Constitutional:  Body mass index is 27.46 kg/m. , not in acute distress, normal state of mind  CMP     Component Value Date/Time   NA 138 09/20/2024 1344   K 4.1 09/20/2024 1344   CL 94 (L) 09/20/2024 1344   CO2 23 09/20/2024 1344   GLUCOSE 100 (H) 09/20/2024 1344   GLUCOSE 136 (H) 08/21/2023 0953   BUN 13 09/20/2024 1344   CREATININE 0.98 09/20/2024 1344   CREATININE 1.03 12/23/2019 1036   CALCIUM  10.5 (H) 09/20/2024 1344   PROT 8.0 09/20/2024 1344   ALBUMIN 5.0 (H) 09/20/2024 1344   AST 26 09/20/2024 1344   ALT 28 09/20/2024 1344   ALKPHOS 86 09/20/2024 1344   BILITOT 1.0 09/20/2024 1344   GFRNONAA >60 08/21/2023 0953   GFRNONAA 60 12/23/2019 1036   GFRAA 69 12/23/2019 1036     Diabetic Labs (most recent): Lab Results  Component Value Date   HGBA1C 5.2 05/21/2024   HGBA1C 5.8 (H) 08/02/2023   HGBA1C 5.2 05/23/2023   MICROALBUR 80 08/31/2020   MICROALBUR 2.8 12/23/2019     Lipid Panel ( most recent) Lipid Panel  Component Value Date/Time   CHOL 166 09/20/2024 1344   TRIG 121 09/20/2024 1344   HDL 54 09/20/2024 1344   CHOLHDL 3.1 09/20/2024 1344   CHOLHDL 3.9 09/10/2014 0805   VLDL 25 09/10/2014 0805   LDLCALC 90 09/20/2024 1344   LABVLDL 22 09/20/2024 1344     Assessment & Plan:   1) Type 2 diabetes mellitus with hyperglycemia, without long-term current use of insulin (HCC)  - Laura CROME Hanke has currently uncontrolled symptomatic type 2 DM since  64 years of age.  She presents with controlled glycemic profile with point-of-care A1c of 5.3%.    She remains on  Mounjaro  and metformin .   . - I had a long discussion with her about the progressive nature of diabetes and the pathology behind its complications. -She does not report any gross complications from her diabetes, however, she remains at a high risk for more acute and chronic complications which include CAD, CVA, CKD, retinopathy, and neuropathy. These are all discussed in detail with her.  - Nutritional counseling repeated at each appointment due to patients tendency to fall back in to old habits.  - she acknowledges that there is a room for improvement in her food and drink choices. - Suggestion is made for her to avoid simple carbohydrates  from her diet including Cakes, Sweet Desserts, Ice Cream, Soda (diet and regular), Sweet Tea, Candies, Chips, Cookies, Store Bought Juices, Alcohol , Artificial Sweeteners,  Coffee Creamer, and Sugar-free Products, Lemonade. This will help patient to have more stable blood glucose profile and potentially avoid unintended weight gain.  The following Lifestyle Medicine recommendations according to American College of Lifestyle Medicine  [facility]) were discussed and and offered to patient and she  agrees to start the journey:  A. Whole Foods, Plant-Based Nutrition comprising of fruits and vegetables, plant-based proteins, whole-grain carbohydrates was discussed in detail with the patient.   A list for source of those nutrients were also provided to the patient.  Patient will use only water  or unsweetened tea for hydration. B.  The need to stay away from risky substances including alcohol, smoking; obtaining 7 to 9 hours of restorative sleep, at least 150 minutes of moderate intensity exercise weekly, the importance of healthy social connections,  and stress management techniques were discussed. C.  A full color page of  Calorie density of various food groups per pound showing examples of each food groups was provided to the patient.  - she has been scheduled with  Penny Crumpton, RDN, CDE for diabetes education.  - I have approached her with the following individualized plan to manage  her diabetes and patient agrees:   -In light of her presentation controlled glycemic profile, she would not need any additional intervention at this time.  She wishes to avoid any further weight loss.  She is advised to continue Mounjaro  7.5 mg subcutaneously weekly, and metformin  500 mg p.o. daily at breakfast.     - Specific targets for  A1c;  LDL, HDL,  and Triglycerides were discussed with the patient.  2) Blood Pressure /Hypertension:  - Her blood pressure is controlled to target.  she is advised to continue her current meds including HCTZ 25 mg p.o. daily with breakfast  and  losartan  50 mg p.o. daily at breakfast.    3) Lipids/Hyperlipidemia:    Review of her previsit lipid panel showed significant improvement in her lipid panel with LDL at 37, improving from 884.   She is advised to continue  atorvastatin  20 mg nightly.    She will also benefit from whole food plant-based diet discussed above. She does have elevated liver enzymes which could be a sign of fatty liver.  This is discussed with her the potential risk for liver failure.  If her ferritin remains high, she will need a consult with hematology.   4)  Weight/Diet:   Her Body mass index is 27.46 kg/m.  -   she has achieved enough weight loss, not a candidate for major weight loss.    I discussed with her the fact that loss of 5 - 10% of her  current body weight will have the most impact on her diabetes management.  Exercise, and detailed carbohydrates information provided  -  detailed on discharge instructions.  5) Chronic Care/Health Maintenance: -she  is on ARB and statin medications and  is encouraged to initiate and continue to follow up with Ophthalmology, Dentist,  Podiatrist at least yearly or according to recommendations, and advised to stay away from smoking. I have recommended yearly flu  vaccine and pneumonia vaccine at least every 5 years; moderate intensity exercise for up to 150 minutes weekly; and  sleep for at least 7 hours a day.  Diabetic foot exam was normal on May 21, 2024.  - she is  advised to maintain close follow up with Bacchus, Meade PEDLAR, FNP for primary care needs, as well as her other providers for optimal and coordinated care.   I spent  25  minutes in the care of the patient today including review of labs from Thyroid  Function, CMP, and other relevant labs ; imaging/biopsy records (current and previous including abstractions from other facilities); face-to-face time discussing  her lab results and symptoms, medications doses, her options of short and long term treatment based on the latest standards of care / guidelines;   and documenting the encounter.  Laura Scott  participated in the discussions, expressed understanding, and voiced agreement with the above plans.  All questions were answered to her satisfaction. she is encouraged to contact clinic should she have any questions or concerns prior to her return visit. Dear Patient: Feel free to review your progress notes.  If you are reviewing this progress note and have questions about the meaning of /or medical terms being used, please make a note and address it at your next follow-up appointment.  Medical notes are meant to be a communication tool between medical professionals and require medical terms to be used for efficiency and insurance approval.   Follow up plan: - Return in about 4 months (around 01/23/2025) for Bring Meter/CGM Device/Logs- A1c in Office.  [place]enton Rio, [facility] [address] [place], [place] [address] Phone: [phone] Fax: [phone]  09/25/2024, 10:37 AM    "

## 2024-09-25 NOTE — Telephone Encounter (Signed)
 Orders placed.

## 2024-09-26 NOTE — Telephone Encounter (Signed)
 Pt advised

## 2024-10-09 ENCOUNTER — Ambulatory Visit: Payer: Self-pay | Admitting: Nurse Practitioner

## 2024-11-15 ENCOUNTER — Ambulatory Visit

## 2025-01-23 ENCOUNTER — Ambulatory Visit: Admitting: "Endocrinology

## 2025-09-26 ENCOUNTER — Ambulatory Visit: Payer: Self-pay
